# Patient Record
Sex: Female | Born: 1935
Health system: Southern US, Community
[De-identification: ages and names within clinical notes are randomized; demographics above are authoritative.]

## PROBLEM LIST (undated history)

## (undated) DIAGNOSIS — M1990 Unspecified osteoarthritis, unspecified site: Secondary | ICD-10-CM

## (undated) DIAGNOSIS — D75A Glucose-6-phosphate dehydrogenase (G6PD) deficiency without anemia: Secondary | ICD-10-CM

## (undated) DIAGNOSIS — I639 Cerebral infarction, unspecified: Secondary | ICD-10-CM

## (undated) DIAGNOSIS — R600 Localized edema: Secondary | ICD-10-CM

## (undated) DIAGNOSIS — R609 Edema, unspecified: Secondary | ICD-10-CM

## (undated) DIAGNOSIS — D510 Vitamin B12 deficiency anemia due to intrinsic factor deficiency: Secondary | ICD-10-CM

## (undated) DIAGNOSIS — I10 Essential (primary) hypertension: Secondary | ICD-10-CM

## (undated) HISTORY — PX: MULTIPLE TOOTH EXTRACTIONS: SHX2053

## (undated) HISTORY — PX: OTHER SURGICAL HISTORY: SHX169

---

## 1966-12-29 HISTORY — PX: BONE MARROW BIOPSY: SHX199

## 2000-09-28 DIAGNOSIS — I639 Cerebral infarction, unspecified: Secondary | ICD-10-CM | POA: Diagnosis present

## 2000-09-28 HISTORY — DX: Cerebral infarction, unspecified: I63.9

## 2000-10-11 ENCOUNTER — Encounter: Payer: Self-pay | Admitting: Emergency Medicine

## 2000-10-11 ENCOUNTER — Inpatient Hospital Stay (HOSPITAL_COMMUNITY): Admission: EM | Admit: 2000-10-11 | Discharge: 2000-10-14 | Payer: Self-pay | Admitting: Emergency Medicine

## 2000-10-12 ENCOUNTER — Encounter: Payer: Self-pay | Admitting: *Deleted

## 2000-10-14 ENCOUNTER — Inpatient Hospital Stay (HOSPITAL_COMMUNITY)
Admission: RE | Admit: 2000-10-14 | Discharge: 2000-10-20 | Payer: Self-pay | Admitting: Physical Medicine & Rehabilitation

## 2000-11-25 ENCOUNTER — Encounter
Admission: RE | Admit: 2000-11-25 | Discharge: 2001-01-14 | Payer: Self-pay | Admitting: Physical Medicine & Rehabilitation

## 2003-09-22 ENCOUNTER — Encounter: Admission: RE | Admit: 2003-09-22 | Discharge: 2003-09-22 | Payer: Self-pay | Admitting: Internal Medicine

## 2003-09-22 ENCOUNTER — Encounter: Payer: Self-pay | Admitting: Internal Medicine

## 2004-02-28 ENCOUNTER — Encounter: Payer: Self-pay | Admitting: Cardiology

## 2004-02-28 ENCOUNTER — Ambulatory Visit (HOSPITAL_COMMUNITY): Admission: RE | Admit: 2004-02-28 | Discharge: 2004-02-28 | Payer: Self-pay | Admitting: Internal Medicine

## 2005-05-01 ENCOUNTER — Encounter: Admission: RE | Admit: 2005-05-01 | Discharge: 2005-05-01 | Payer: Self-pay | Admitting: Internal Medicine

## 2008-11-14 ENCOUNTER — Encounter: Admission: RE | Admit: 2008-11-14 | Discharge: 2008-12-28 | Payer: Self-pay | Admitting: Internal Medicine

## 2009-01-02 ENCOUNTER — Encounter: Admission: RE | Admit: 2009-01-02 | Discharge: 2009-01-11 | Payer: Self-pay | Admitting: Internal Medicine

## 2010-05-10 ENCOUNTER — Encounter: Admission: RE | Admit: 2010-05-10 | Discharge: 2010-05-10 | Payer: Self-pay | Admitting: Internal Medicine

## 2011-05-16 NOTE — Discharge Summary (Signed)
[place]  Patient:    Connie Miranda, Connie Miranda                        MRN: [id number] Adm. Date:  [id number] Attending:  Faith Rogue T Dictator:   Mcarthur Rossetti. Angiulli, P.A. CC:         Raynelle Jan, M.D., [facility]   Discharge Summary  ADDENDUM  FOLLOWUP:  Raynelle Jan, M.D., of the [facility], who the patient was to see on November 10, 2000, at 10:30 a.m.  Phone number [phone]. DD:  10/20/00 TD:  10/20/00 Job: [id number]

## 2011-09-25 ENCOUNTER — Inpatient Hospital Stay (HOSPITAL_COMMUNITY)
Admission: EM | Admit: 2011-09-25 | Discharge: 2011-09-28 | DRG: 603 | Disposition: A | Discharge status: Home / Self Care | Payer: Medicare Other | Attending: Internal Medicine | Admitting: Internal Medicine

## 2011-09-25 DIAGNOSIS — Z880 Allergy status to penicillin: Secondary | ICD-10-CM

## 2011-09-25 DIAGNOSIS — D510 Vitamin B12 deficiency anemia due to intrinsic factor deficiency: Secondary | ICD-10-CM | POA: Diagnosis present

## 2011-09-25 DIAGNOSIS — I10 Essential (primary) hypertension: Secondary | ICD-10-CM | POA: Diagnosis present

## 2011-09-25 DIAGNOSIS — Z8673 Personal history of transient ischemic attack (TIA), and cerebral infarction without residual deficits: Secondary | ICD-10-CM

## 2011-09-25 DIAGNOSIS — L03119 Cellulitis of unspecified part of limb: Principal | ICD-10-CM | POA: Diagnosis present

## 2011-09-25 DIAGNOSIS — M1990 Unspecified osteoarthritis, unspecified site: Secondary | ICD-10-CM | POA: Diagnosis present

## 2011-09-25 DIAGNOSIS — Z7902 Long term (current) use of antithrombotics/antiplatelets: Secondary | ICD-10-CM

## 2011-09-25 DIAGNOSIS — Z888 Allergy status to other drugs, medicaments and biological substances status: Secondary | ICD-10-CM

## 2011-09-25 DIAGNOSIS — Z882 Allergy status to sulfonamides status: Secondary | ICD-10-CM

## 2011-09-25 DIAGNOSIS — I872 Venous insufficiency (chronic) (peripheral): Secondary | ICD-10-CM | POA: Diagnosis present

## 2011-09-25 DIAGNOSIS — Z79899 Other long term (current) drug therapy: Secondary | ICD-10-CM

## 2011-09-25 DIAGNOSIS — L02419 Cutaneous abscess of limb, unspecified: Principal | ICD-10-CM | POA: Diagnosis present

## 2011-09-25 DIAGNOSIS — R609 Edema, unspecified: Secondary | ICD-10-CM | POA: Diagnosis present

## 2011-09-25 DIAGNOSIS — R7309 Other abnormal glucose: Secondary | ICD-10-CM | POA: Diagnosis present

## 2011-09-25 DIAGNOSIS — E669 Obesity, unspecified: Secondary | ICD-10-CM | POA: Diagnosis present

## 2011-09-25 LAB — CBC
HCT: 34.6 % — ABNORMAL LOW (ref 36.0–46.0)
MCHC: 32.9 g/dL (ref 30.0–36.0)
MCV: 93.3 fL (ref 78.0–100.0)
Platelets: 280 10*3/uL (ref 150–400)
RDW: 12.8 % (ref 11.5–15.5)
WBC: 9 10*3/uL (ref 4.0–10.5)

## 2011-09-25 LAB — DIFFERENTIAL
Basophils Absolute: 0 10*3/uL (ref 0.0–0.1)
Eosinophils Absolute: 0.2 10*3/uL (ref 0.0–0.7)
Eosinophils Relative: 2 % (ref 0–5)
Lymphocytes Relative: 19 % (ref 12–46)
Lymphs Abs: 1.7 10*3/uL (ref 0.7–4.0)
Monocytes Absolute: 0.6 10*3/uL (ref 0.1–1.0)

## 2011-09-25 LAB — BASIC METABOLIC PANEL
BUN: 21 mg/dL (ref 6–23)
Chloride: 102 mEq/L (ref 96–112)
Creatinine, Ser: 1 mg/dL (ref 0.50–1.10)
GFR calc Af Amer: >60 mL/min (ref >60)
GFR calc non Af Amer: 54 mL/min — ABNORMAL LOW (ref >60)

## 2011-09-26 ENCOUNTER — Emergency Department (HOSPITAL_COMMUNITY): Payer: Medicare Other

## 2011-09-26 ENCOUNTER — Inpatient Hospital Stay (HOSPITAL_COMMUNITY): Payer: Medicare Other

## 2011-09-26 DIAGNOSIS — M7989 Other specified soft tissue disorders: Secondary | ICD-10-CM

## 2011-09-26 LAB — BASIC METABOLIC PANEL
Chloride: 102 mEq/L (ref 96–112)
GFR calc Af Amer: >60 mL/min (ref >60)
GFR calc non Af Amer: 56 mL/min — ABNORMAL LOW (ref >60)
Potassium: 3.7 mEq/L (ref 3.5–5.1)
Sodium: 140 mEq/L (ref 135–145)

## 2011-09-26 LAB — CBC
MCHC: 33.3 g/dL (ref 30.0–36.0)
Platelets: 268 10*3/uL (ref 150–400)
RDW: 12.8 % (ref 11.5–15.5)
WBC: 9.6 10*3/uL (ref 4.0–10.5)

## 2011-09-26 LAB — FERRITIN: Ferritin: 267 ng/mL (ref 10–291)

## 2011-09-26 LAB — IRON AND TIBC
Iron: 33 ug/dL — ABNORMAL LOW (ref 42–135)
Saturation Ratios: 15 % — ABNORMAL LOW (ref 20–55)
TIBC: 221 ug/dL — ABNORMAL LOW (ref 250–470)
UIBC: 188 ug/dL (ref 125–400)

## 2011-09-27 LAB — CBC
HCT: 31 % — ABNORMAL LOW (ref 36.0–46.0)
Hemoglobin: 10.2 g/dL — ABNORMAL LOW (ref 12.0–15.0)
MCH: 31.1 pg (ref 26.0–34.0)
RBC: 3.28 MIL/uL — ABNORMAL LOW (ref 3.87–5.11)

## 2011-09-27 LAB — HEMOGLOBIN A1C
Hgb A1c MFr Bld: 6.7 % — ABNORMAL HIGH (ref <5.7)
Mean Plasma Glucose: 146 mg/dL — ABNORMAL HIGH (ref <117)

## 2011-09-29 NOTE — H&P (Signed)
Connie Miranda, SALVAGGIO NO.:  [id number]  MEDICAL RECORD NO.:  [id number]  LOCATION:  [facility]                         FACILITY:  [facility]  PHYSICIAN:  Osvaldo Shipper, MD     DATE OF BIRTH:  [birth year]  DATE OF ADMISSION:  09/25/2011 DATE OF DISCHARGE:                             HISTORY & PHYSICAL   PRIMARY CARE PHYSICIAN:  [name], MD  ADMISSION DIAGNOSES: 1. Left lower extremity cellulitis, failed outpatient treatment. 2. Bilateral pedal edema. 3. History of hypertension. 4. History of arthritis. 5. History of pernicious anemia.  CHIEF COMPLAINT:  Left leg pain and swelling.  HISTORY OF PRESENT ILLNESS:  The patient is a 77 year old African American female who is obese who was in her usual state of health about 3 weeks ago when she started noticing a wound on the left lower extremity.  It was red, it was swollen, warm to touch.  She went to see a local physician who was not her PCP.  The patient was prescribed doxycycline which she took for about 2-3 weeks.  She has not experienced any improvement in that wound.  Initially, the wound was wet, she had some drainage, but over the last week or so, it has been dry.  Still has some warmth in that area.  She also has been applying an antibiotic cream to that wound.  She denies any fever or chills recently.  She has some difficulty ambulating, however, she attributes that to the arthritis.  Denies any shortness of breath or chest pain.  She does mention that her left leg is more swollen than the right.  She went to see her primary care physician today and upon examining the lower extremity, he recommended that the patient will need to receive IV antibiotics in an inpatient setting.  Medications at home include the following: 1. Vitamin B12, 1000 mcg q. monthly on the 11th of every month. 2. Flexeril 5 mg 3 times a day as needed for muscle spasms. 3. Vicodin 5/500 twice daily as needed for pain. 4.  Benicar/HCT 40/25 one tablet daily. 5. Os-Cal plus D over the counter twice daily. 6. Potassium chloride 10 mEq as needed with Lasix. 7. Lasix 40 mg daily as needed for fluid retention. 8. Norvasc 10 mg daily. 9. Clopidogrel 75 mg p.o. daily.  Allergies include ASPIRIN, CODEINE, PENICILLIN, and SULFA.  She tells me that she has a condition called phosphate 6, could be G6PD, but this is not entirely clear at this time.  PAST MEDICAL HISTORY:  Positive for: 1. Osteoarthritis. 2. Tendonitis. 3. Pernicious anemia. 4. She tells me she has an enlarged heart from hypertension, however,     denies any history of heart failure. 5. History of TIA about 11 years ago. 6. History of hypertension.  No history of diabetes.  No coronary     artery disease.  PAST SURGICAL HISTORY:  She has had dental surgery and removal of cyst from her skin over multiple sites.  SOCIAL HISTORY:  Lives alone in [place].  Denies smoking alcohol or illicit drug use.  She uses a cane to ambulate and occasionally a walker.  FAMILY HISTORY:  She has a brother who  has diabetes, mother who has CHF, father has heart disease.  REVIEW OF SYSTEMS:  GENERAL SYSTEM:  Positive for weakness, malaise. HEENT:  Unremarkable.  CARDIOVASCULAR:  Remarkable.  RESPIRATORY: Unremarkable.  GI:  Unremarkable.  GU:  Unremarkable.  NEUROLOGICAL: Unremarkable.  PSYCHIATRIC:  Unremarkable.  DERMATOLOGICAL:  As in HPI. MUSCULOSKELETAL:  As in HPI.  Other systems reviewed and found to be negative.  PHYSICAL EXAMINATION:  VITAL SIGNS:  Temperature 98.6, blood pressure 142/69, heart rate 81, respiratory rate 12, saturation 99% on room air. GENERAL:  This is an obese African American female in no distress. HEENT:  Head is normocephalic, atraumatic.  Pupils are equal, reacting. No pallor, no icterus.  Oral, mucous membranes moist.  No oral lesions are noted. NECK:  Soft and supple.  No thyromegaly is appreciated. LUNGS:  Clear to  auscultation bilaterally with no wheezing, rales, or rhonchi. CARDIOVASCULAR:  S1, S2 is normal, regular.  No S3, S4.  There is a systolic murmur appreciated over the aortic area.  She does have bilateral pitting pedal edema. ABDOMEN:  Obese, nontender, nondistended.  Bowel sounds present.  No masses or organomegaly appreciated.  She does have some inguinal lymphadenopathy, especially on the left side. GU:  Deferred. MUSCULOSKELETAL:  Normal muscle mass and tone with some joint deformities due to arthritis. SKIN:  She does have erythema over the left lower extremity along the lateral aspect.  The left leg also shows dried wound on the left side. No active drainage is noted.  The left leg is warm to touch, mildly erythematous, nontender.  She does have good peripheral pulses. NEUROLOGICAL:  She is alert, oriented x3.  No focal neurological deficits are present.  LABORATORY DATA:  Her white cell count is 9.0, hemoglobin is 11.4, MCV is 93, platelet count is 280.  Electrolytes are all normal, glucose is 122.  No imaging studies have been done.  ASSESSMENT:  This is a 77 year old African American female who is obese who has a left lower extremity wound and cellulitis.  She has been sent by her primary care physician for IV antibiotics.  She does have some erythema along the left leg.  She may benefit from a day or two of IV antibiotics, however, I think she needs long-term wound care.  We will also take this opportunity to get an x-ray of the left leg as well as venous Doppler to make sure there is no DVT or any other issues.  PLAN: 1. Left lower extremity cellulitis.  We will put her on vancomycin and     intravenous Levaquin for now.  We will have Wound Care evaluate     her.  She may require referral to a wound care center for long-term     management.  I anticipate that she would require only 1-2 days of     intravenous antibiotics and then she would be able to go home with      oral antibiotics and follow up with her PCP.  We will check x-ray     of left leg as well as venous Dopplers to make sure there is no     blood clot.  PT/OT will be asked to evaluate this patient as well. 2. History of hypertension.  Continue with Benicar and Norvasc. 3. History of anemia.  Check anemia panel. 4. Pedal edema.  We will go ahead and give her a dose of IV Lasix     tonight and then give her orally from tomorrow.  There  is a report     of an echocardiogram, but it is from 2005, which revealed an EF of     55%-65%.  No significant valvular abnormalities were noted at that     time.  There is no indication for an inpatient echo, however, I     would recommend an outpatient echocardiogram if one has not been     done recently.  TSH will also be checked and we will follow up on     the results of the venous Doppler. 5. Obesity.  Weight loss counseling will need to be provided. 6. History of transient ischemic attack.  Continue with Plavix.  Deep venous thrombosis prophylaxis will be initiated.  Further management decisions will depend on results of further testing and the patient's response to treatment.  Osvaldo Shipper, MD     GK/MEDQ  D:  09/25/2011  T:  09/25/2011  Job:  [id number]  cc:   [name], M.D.  Electronically Signed by Osvaldo Shipper MD on 09/29/2011 07:19:57 PM

## 2011-10-02 LAB — CULTURE, BLOOD (ROUTINE X 2)
Culture  Setup Time: 201209280854
Culture: NO GROWTH

## 2011-10-09 NOTE — Discharge Summary (Signed)
NAMEMARRIE, Connie Miranda NO.:  [id number]  MEDICAL RECORD NO.:  [id number]  LOCATION:  5524                         FACILITY:  [facility]  PHYSICIAN:  Gordy Savers, MDDATE OF BIRTH:  [birth year]  DATE OF ADMISSION:  09/25/2011 DATE OF DISCHARGE:  09/28/2011                              DISCHARGE SUMMARY   The patient is a 77 year old patient who was admitted for evaluation and treatment of worsening left lower extremity cellulitis after failure for outpatient treatment.  HISTORY OF PRESENT ILLNESS:  A 77 year old African American female who has history of obesity and chronic venous insufficiency.  Approximately 3 weeks prior to admission, she noted a superficial wound involving her left lower lateral extremity.  It became red, swollen, and tender to touch.  She was seen by physician approximately 2 weeks prior to admission and treated with doxycycline.  Due to failure of improvement, she was seen by her PCP who recommended referral to the ED for inpatient management of her left lower extremity cellulitis.  She denied any fever or chills.  She had only minimal discomfort with ambulation.  She denied any pulmonary complaints.  PAST MEDICAL HISTORY:  Pertinent for a penicillin, sulfa, codeine, and aspirin allergy.  MEDICAL PROBLEMS: 1. Osteoarthritis. 2. Pernicious anemia. 3. History of prior TIA. 4. History of hypertension.  LABORATORY DATA AND HOSPITAL COURSE:  The patient's white count was 9.0, hemoglobin 11.4, MCV 93, white count remained normal.  Electrolytes were unremarkable.  Admission glucose 122.  Examination of the lower extremity did reveal erythema and soft tissue swelling, most large involving her left lower extremity along the lateral aspect of the lower leg and foot, chronic stasis changes were noted.  There is a dry wound with eschar involving the left lateral lower leg, but no active drainage.  On admission, the leg was quite warm to  touch and slightly tender and erythematous.  Pedal pulses were intact.  The patient was admitted to the hospital where she was placed on parenteral antibiotics.  This included vancomycin and Levaquin during the hospital.  She was followed by pharmacy and was treated with a vancomycin protocol.  Her hospital course was marked by steady and slow improvement.  Wound care consult was obtained.  X-rays and lower extremity venous Doppler examinations were unremarkable.  There is no evidence of deep vein or superficial thrombosis or a Baker cyst.  Her blood pressure was well controlled during the hospital.  At the time of discharge, the patient was ambulatory without pain.  She continued to have chronic stasis changes and mild edema, but there is no active tenderness or warmth to touch.  DISPOSITION:  The patient will be discharged today to complete 7 days of Levaquin 500 mg p.o.  FINAL DIAGNOSES: 1. Left lower extremity cellulitis. 2. Stasis dermatitis with chronic lower extremity edema. 3. Hypertension. 4. Osteoarthritis.5. Peripheral edema. 6. Impaired glucose tolerance.  DISCHARGE MEDICATIONS: 1. Levaquin 500 mg daily for 7 additional days. 2. Benicar/HCT 40/25 daily. 3. Plavix 75 mg daily. 4. Flexeril 5 mg t.i.d. p.r.n. 5. Lasix 40 mg daily. 6. Norvasc 10 mg daily. 7. Os-Cal plus D twice daily. 8. Potassium chloride 10 mEq daily. 9. Vicodin  p.r.n.  The patient has been asked to apply Eucerin cream to her dry lower extremities b.i.d.  She has also been asked to follow up with her primary care provider on October 02, 2011.     Gordy Savers, MD     PFK/MEDQ  D:  09/28/2011  T:  09/28/2011  Job:  [id number]  Electronically Signed by [name] MD on 10/09/2011 09:15:11 AM

## 2012-10-11 ENCOUNTER — Emergency Department (HOSPITAL_COMMUNITY): Payer: Medicare Other

## 2012-10-11 ENCOUNTER — Encounter (HOSPITAL_COMMUNITY): Payer: Self-pay | Admitting: Emergency Medicine

## 2012-10-11 ENCOUNTER — Inpatient Hospital Stay (HOSPITAL_COMMUNITY)
Admission: EM | Admit: 2012-10-11 | Discharge: 2012-10-15 | DRG: 603 | Disposition: A | Discharge status: Home / Self Care | Payer: Medicare Other | Attending: Internal Medicine | Admitting: Internal Medicine

## 2012-10-11 DIAGNOSIS — D649 Anemia, unspecified: Secondary | ICD-10-CM

## 2012-10-11 DIAGNOSIS — Z8673 Personal history of transient ischemic attack (TIA), and cerebral infarction without residual deficits: Secondary | ICD-10-CM

## 2012-10-11 DIAGNOSIS — M1610 Unilateral primary osteoarthritis, unspecified hip: Secondary | ICD-10-CM | POA: Diagnosis present

## 2012-10-11 DIAGNOSIS — Z8249 Family history of ischemic heart disease and other diseases of the circulatory system: Secondary | ICD-10-CM

## 2012-10-11 DIAGNOSIS — I890 Lymphedema, not elsewhere classified: Secondary | ICD-10-CM | POA: Diagnosis present

## 2012-10-11 DIAGNOSIS — Z79899 Other long term (current) drug therapy: Secondary | ICD-10-CM

## 2012-10-11 DIAGNOSIS — R197 Diarrhea, unspecified: Secondary | ICD-10-CM | POA: Diagnosis not present

## 2012-10-11 DIAGNOSIS — R1012 Left upper quadrant pain: Secondary | ICD-10-CM | POA: Diagnosis present

## 2012-10-11 DIAGNOSIS — E7400 Glycogen storage disease, unspecified: Secondary | ICD-10-CM | POA: Diagnosis present

## 2012-10-11 DIAGNOSIS — R1032 Left lower quadrant pain: Secondary | ICD-10-CM

## 2012-10-11 DIAGNOSIS — M25559 Pain in unspecified hip: Secondary | ICD-10-CM

## 2012-10-11 DIAGNOSIS — L03116 Cellulitis of left lower limb: Secondary | ICD-10-CM

## 2012-10-11 DIAGNOSIS — R109 Unspecified abdominal pain: Secondary | ICD-10-CM

## 2012-10-11 DIAGNOSIS — Z7902 Long term (current) use of antithrombotics/antiplatelets: Secondary | ICD-10-CM

## 2012-10-11 DIAGNOSIS — K449 Diaphragmatic hernia without obstruction or gangrene: Secondary | ICD-10-CM | POA: Diagnosis present

## 2012-10-11 DIAGNOSIS — Z6841 Body Mass Index (BMI) 40.0 and over, adult: Secondary | ICD-10-CM

## 2012-10-11 DIAGNOSIS — Z87891 Personal history of nicotine dependence: Secondary | ICD-10-CM

## 2012-10-11 DIAGNOSIS — Z888 Allergy status to other drugs, medicaments and biological substances status: Secondary | ICD-10-CM

## 2012-10-11 DIAGNOSIS — D75A Glucose-6-phosphate dehydrogenase (G6PD) deficiency without anemia: Secondary | ICD-10-CM | POA: Diagnosis present

## 2012-10-11 DIAGNOSIS — K5730 Diverticulosis of large intestine without perforation or abscess without bleeding: Secondary | ICD-10-CM | POA: Diagnosis present

## 2012-10-11 DIAGNOSIS — R609 Edema, unspecified: Secondary | ICD-10-CM | POA: Diagnosis present

## 2012-10-11 DIAGNOSIS — Z880 Allergy status to penicillin: Secondary | ICD-10-CM

## 2012-10-11 DIAGNOSIS — R600 Localized edema: Secondary | ICD-10-CM

## 2012-10-11 DIAGNOSIS — L0390 Cellulitis, unspecified: Secondary | ICD-10-CM

## 2012-10-11 DIAGNOSIS — I639 Cerebral infarction, unspecified: Secondary | ICD-10-CM | POA: Diagnosis present

## 2012-10-11 DIAGNOSIS — I10 Essential (primary) hypertension: Secondary | ICD-10-CM | POA: Diagnosis present

## 2012-10-11 DIAGNOSIS — Z885 Allergy status to narcotic agent status: Secondary | ICD-10-CM

## 2012-10-11 DIAGNOSIS — D510 Vitamin B12 deficiency anemia due to intrinsic factor deficiency: Secondary | ICD-10-CM | POA: Diagnosis present

## 2012-10-11 DIAGNOSIS — M169 Osteoarthritis of hip, unspecified: Secondary | ICD-10-CM | POA: Diagnosis present

## 2012-10-11 DIAGNOSIS — Z886 Allergy status to analgesic agent status: Secondary | ICD-10-CM

## 2012-10-11 DIAGNOSIS — L02419 Cutaneous abscess of limb, unspecified: Principal | ICD-10-CM | POA: Diagnosis present

## 2012-10-11 DIAGNOSIS — E669 Obesity, unspecified: Secondary | ICD-10-CM | POA: Diagnosis present

## 2012-10-11 DIAGNOSIS — K5790 Diverticulosis of intestine, part unspecified, without perforation or abscess without bleeding: Secondary | ICD-10-CM

## 2012-10-11 DIAGNOSIS — G8929 Other chronic pain: Secondary | ICD-10-CM

## 2012-10-11 DIAGNOSIS — M1990 Unspecified osteoarthritis, unspecified site: Secondary | ICD-10-CM | POA: Diagnosis present

## 2012-10-11 HISTORY — DX: Glucose-6-phosphate dehydrogenase (G6PD) deficiency without anemia: D75.A

## 2012-10-11 HISTORY — DX: Edema, unspecified: R60.9

## 2012-10-11 HISTORY — DX: Cerebral infarction, unspecified: I63.9

## 2012-10-11 HISTORY — DX: Vitamin B12 deficiency anemia due to intrinsic factor deficiency: D51.0

## 2012-10-11 HISTORY — DX: Essential (primary) hypertension: I10

## 2012-10-11 HISTORY — DX: Unspecified osteoarthritis, unspecified site: M19.90

## 2012-10-11 LAB — COMPREHENSIVE METABOLIC PANEL
ALT: 16 U/L (ref 0–35)
AST: 24 U/L (ref 0–37)
CO2: 29 mEq/L (ref 19–32)
Chloride: 103 mEq/L (ref 96–112)
GFR calc non Af Amer: 58 mL/min — ABNORMAL LOW (ref >90)
Sodium: 139 mEq/L (ref 135–145)
Total Bilirubin: 0.4 mg/dL (ref 0.3–1.2)

## 2012-10-11 LAB — CBC
Hemoglobin: 10.8 g/dL — ABNORMAL LOW (ref 12.0–15.0)
MCV: 94.7 fL (ref 78.0–100.0)
Platelets: 283 10*3/uL (ref 150–400)
RBC: 3.58 MIL/uL — ABNORMAL LOW (ref 3.87–5.11)
WBC: 13.5 10*3/uL — ABNORMAL HIGH (ref 4.0–10.5)

## 2012-10-11 LAB — URINALYSIS, ROUTINE W REFLEX MICROSCOPIC
Nitrite: NEGATIVE
Protein, ur: 30 mg/dL — AB
Specific Gravity, Urine: 1.028 (ref 1.005–1.030)
Urobilinogen, UA: 0.2 mg/dL (ref 0.0–1.0)

## 2012-10-11 LAB — URINE MICROSCOPIC-ADD ON

## 2012-10-11 LAB — LACTIC ACID, PLASMA: Lactic Acid, Venous: 1.2 mmol/L (ref 0.5–2.2)

## 2012-10-11 MED ORDER — ONDANSETRON HCL 4 MG/2ML IJ SOLN
4.0000 mg | Freq: Once | INTRAMUSCULAR | Status: AC
  Administered 2012-10-11: 4 mg via INTRAVENOUS
  Filled 2012-10-11: qty 2

## 2012-10-11 MED ORDER — HYDROCODONE-ACETAMINOPHEN 5-325 MG PO TABS
1.0000 | ORAL_TABLET | Freq: Two times a day (BID) | ORAL | Status: DC | PRN
  Administered 2012-10-12 – 2012-10-14 (×2): 1 via ORAL
  Filled 2012-10-11 (×3): qty 1

## 2012-10-11 MED ORDER — HYDROCHLOROTHIAZIDE 25 MG PO TABS
25.0000 mg | ORAL_TABLET | Freq: Every day | ORAL | Status: DC
  Administered 2012-10-12 – 2012-10-15 (×4): 25 mg via ORAL
  Filled 2012-10-11 (×4): qty 1

## 2012-10-11 MED ORDER — ADULT MULTIVITAMIN W/MINERALS CH
1.0000 | ORAL_TABLET | Freq: Every day | ORAL | Status: DC
  Administered 2012-10-12 – 2012-10-15 (×4): 1 via ORAL
  Filled 2012-10-11 (×4): qty 1

## 2012-10-11 MED ORDER — HYDROMORPHONE HCL PF 1 MG/ML IJ SOLN
1.0000 mg | Freq: Once | INTRAMUSCULAR | Status: AC
  Administered 2012-10-11: 1 mg via INTRAVENOUS
  Filled 2012-10-11: qty 1

## 2012-10-11 MED ORDER — MORPHINE SULFATE 4 MG/ML IJ SOLN
4.0000 mg | Freq: Once | INTRAMUSCULAR | Status: AC
  Administered 2012-10-11: 4 mg via INTRAVENOUS
  Filled 2012-10-11: qty 1

## 2012-10-11 MED ORDER — POTASSIUM CHLORIDE CRYS ER 20 MEQ PO TBCR
10.0000 meq | EXTENDED_RELEASE_TABLET | Freq: Every day | ORAL | Status: DC
  Filled 2012-10-11: qty 0.5

## 2012-10-11 MED ORDER — CLOPIDOGREL BISULFATE 75 MG PO TABS
75.0000 mg | ORAL_TABLET | Freq: Every day | ORAL | Status: DC
  Administered 2012-10-12 – 2012-10-15 (×4): 75 mg via ORAL
  Filled 2012-10-11 (×5): qty 1

## 2012-10-11 MED ORDER — ALISKIREN FUMARATE 150 MG PO TABS
300.0000 mg | ORAL_TABLET | Freq: Every day | ORAL | Status: DC
  Administered 2012-10-13 – 2012-10-15 (×3): 300 mg via ORAL
  Filled 2012-10-11 (×5): qty 2

## 2012-10-11 MED ORDER — AMLODIPINE BESYLATE 10 MG PO TABS
10.0000 mg | ORAL_TABLET | Freq: Every day | ORAL | Status: DC
  Administered 2012-10-12 – 2012-10-15 (×4): 10 mg via ORAL
  Filled 2012-10-11 (×5): qty 1

## 2012-10-11 MED ORDER — OXYCODONE-ACETAMINOPHEN 5-325 MG PO TABS: ORAL_TABLET | ORAL | Status: DC

## 2012-10-11 MED ORDER — FUROSEMIDE 40 MG PO TABS
40.0000 mg | ORAL_TABLET | Freq: Every day | ORAL | Status: DC | PRN
Start: 2012-10-12 — End: 2012-10-13
  Administered 2012-10-12: 40 mg via ORAL
  Filled 2012-10-11: qty 1
  Filled 2012-10-11: qty 2

## 2012-10-11 MED ORDER — ONDANSETRON HCL 4 MG PO TABS: 4.0000 mg | ORAL_TABLET | Freq: Three times a day (TID) | ORAL | Status: DC | PRN

## 2012-10-11 MED ORDER — SODIUM CHLORIDE 0.9 % IV BOLUS (SEPSIS)
500.0000 mL | Freq: Once | INTRAVENOUS | Status: AC
  Administered 2012-10-11: 500 mL via INTRAVENOUS

## 2012-10-11 MED ORDER — IRBESARTAN 300 MG PO TABS
300.0000 mg | ORAL_TABLET | Freq: Every day | ORAL | Status: DC
  Administered 2012-10-13 – 2012-10-15 (×3): 300 mg via ORAL
  Filled 2012-10-11 (×5): qty 1

## 2012-10-11 MED ORDER — IOHEXOL 300 MG/ML  SOLN
80.0000 mL | Freq: Once | INTRAMUSCULAR | Status: AC | PRN
  Administered 2012-10-11: 80 mL via INTRAVENOUS

## 2012-10-11 MED ORDER — OLMESARTAN MEDOXOMIL-HCTZ 40-25 MG PO TABS: 1.0000 | ORAL_TABLET | Freq: Every day | ORAL | Status: DC

## 2012-10-11 NOTE — ED Provider Notes (Signed)
Home medications reviewed and orders entered to continue while awaiting disposition.  Patient currently resting in bed with eyes closed, appears to be sleeping.  Even, non-labored respirations.  Stable VS.  Jimmye Norman, NP 10/11/12 [phone]

## 2012-10-11 NOTE — ED Provider Notes (Signed)
History     CSN: [id number]  Arrival date & time 10/11/12  1128   First MD Initiated Contact with Patient 10/11/12 1159      Chief Complaint  Patient presents with  . Edema    (Consider location/radiation/quality/duration/timing/severity/associated sxs/prior treatment) HPI  Connie Miranda is a 78 y.o. female complaining of left hip, left lower quadrant pain described as severe, sharp intermittent. Patient denies any trauma, fever, change in bowel or bladder habits.  ambulates with walkler  [place]  Past Medical History  Diagnosis Date  . Hypertension   . Stroke   . Edema     History reviewed. No pertinent past surgical history.  No family history on file.  History  Substance Use Topics  . Smoking status: Not on file  . Smokeless tobacco: Not on file  . Alcohol Use:     OB History    Grav Para Term Preterm Abortions TAB SAB Ect Mult Living                  Review of Systems  Allergies  Asa; Codeine; Penicillins; and Phosphate  Home Medications  No current outpatient prescriptions on file.  BP 163/71  Pulse 104  Temp 98.7 F (37.1 C) (Oral)  Resp 16  SpO2 98%  Physical Exam  Nursing note and vitals reviewed. Constitutional: She is oriented to person, place, and time. She appears well-developed and well-nourished. No distress.       Obese  HENT:  Head: Normocephalic.  Mouth/Throat: Oropharynx is clear and moist.  Eyes: Conjunctivae normal and EOM are normal. Pupils are equal, round, and reactive to light.  Cardiovascular: Normal rate and intact distal pulses.   Pulmonary/Chest: Effort normal and breath sounds normal. No stridor. No respiratory distress. She has no rales. She exhibits no tenderness.  Abdominal: Bowel sounds are normal. She exhibits no distension and no mass. There is tenderness. There is no rebound and no guarding.       Mild tenderness to deep palpation of the left lower quadrant.  Musculoskeletal: Normal range of motion.    Full ROM to left hip.   Neurological: She is alert and oriented to person, place, and time.  Skin:       Significant bilateral lower extremity edema with flaking skin no bruising or signs of active infection.  Psychiatric: She has a normal mood and affect.    ED Course  Procedures (including critical care time)  Labs Reviewed  CBC - Abnormal; Notable for the following:    WBC 13.5 (*)     RBC 3.58 (*)     Hemoglobin 10.8 (*)     HCT 33.9 (*)     All other components within normal limits  COMPREHENSIVE METABOLIC PANEL - Abnormal; Notable for the following:    Glucose, Bld 166 (*)     GFR calc non Af Amer 58 (*)     GFR calc Af Amer 67 (*)     All other components within normal limits  URINALYSIS, ROUTINE W REFLEX MICROSCOPIC - Abnormal; Notable for the following:    APPearance CLOUDY (*)     Hgb urine dipstick SMALL (*)     Protein, ur 30 (*)     Leukocytes, UA TRACE (*)     All other components within normal limits  URINE MICROSCOPIC-ADD ON - Abnormal; Notable for the following:    Squamous Epithelial / LPF MANY (*)     Casts HYALINE CASTS (*)  All other components within normal limits  LACTIC ACID, PLASMA   Dg Hip Complete Left  10/11/2012  *RADIOLOGY REPORT*  Clinical Data: Pain in the left hip for 2 days.  No known injury. Unable to move or rotate legs.  LEFT HIP - COMPLETE 2+ VIEW  Comparison: None.  Findings: AP and lateral views of the left hip include an AP view of the pelvis.  There is contrast within the small bowel loops in anticipation of CT exam ordered later today.  Degenerative changes are seen in the hip.  There is no evidence for acute fracture or subluxation.  Of overlying the central pelvis, there is an intrauterine device.  IMPRESSION:  1.  No evidence for acute abnormality of the left hip. 2.  Degenerative changes. 3.  Intrauterine device overlies the central pelvis. 3.  Contrast within small bowel, obscuring detail in the lower abdomen pelvis.   Original  Report Authenticated By: Patterson Hammersmith, M.D.    Ct Abdomen Pelvis W Contrast  10/11/2012  *RADIOLOGY REPORT*  Clinical Data: Left lower quadrant pain for 3 days, left hip pain for 1 day, chronic bilateral pitting edema  CT ABDOMEN AND PELVIS WITH CONTRAST  Technique:  Multidetector CT imaging of the abdomen and pelvis was performed following the standard protocol during bolus administration of intravenous contrast.  Contrast: 80mL OMNIPAQUE IOHEXOL 300 MG/ML  SOLN Dilute oral contrast.  Comparison: None  Findings: Minimal bibasilar atelectasis. Calcified granulomata within liver and spleen. Probable small cyst left kidney 11 x 8 mm image 35. Liver, spleen, pancreas, kidneys, and adrenal glands otherwise normal appearance. Normal appendix. Small hiatal hernia with wall thickening at the gastroesophageal junction, question mass versus inflammatory process or artifact from underdistension. IUD within uterus. Bladder and adnexae unremarkable.  Few sigmoid diverticula are identified without definite evidence of diverticulitis. Numerous normal inguinal lymph nodes bilaterally. Minimal atherosclerotic calcification aorta. No mass, adenopathy, free fluid or inflammatory process. No hernia or acute bony lesion. Multilevel degenerative disc and facet disease changes of the lumbar spine with multifactorial spinal stenosis at L3-L4, L4-L5, and L5-S1.  IMPRESSION: Old granulomatous disease. Minimal sigmoid diverticulosis without evidence of diverticulitis. Wall thickening at gastroesophageal junction question inflammatory process, tumor, or underdistension artifact; recommend EGD or barium esophagram to assess. No definite cause for left hip/left lower quadrant abdominal pain identified. Degenerative disc disease changes lumbar spine with multilevel spinal stenosis.   Original Report Authenticated By: Lollie Marrow, M.D.      1. LLQ pain   2. Diverticulosis       MDM  Abdominal exam is non-surgical with no  peritoneal signs and only minor tenderness to palpation of  the left lower quadrant.  Patient's pain comes in waves and is very severe at it's worst.  Lactic acid is negative, Hip XR US unremarkable. .   Because she has a leukocytosis and considering her age and comorbidities I think it is reasonable to obtain imaging at this time. CT will be ordered.   CT shows no acute processes and Pt is resting comfortably with resolution of pain.   I have advised Pt that the CAT scan revealed an abnormality at the gastroesophageal junction. I have advised her that she must follow with her primary care doctor to further evaluate this. I instructed her to [facility] her discharge paperwork to Dr. Concepcion Elk so he may understand what was seen on the CT and recommended. Patient verbalized understanding. Return precautions given.  New Prescriptions   ONDANSETRON (ZOFRAN) 4 MG  TABLET    Take 1 tablet (4 mg total) by mouth every 8 (eight) hours as needed for nausea.   OXYCODONE-ACETAMINOPHEN (PERCOCET/ROXICET) 5-325 MG PER TABLET    1 to 2 tabs PO q6hrs  PRN for pain         Wynetta Emery, PA-C 10/11/12 1643

## 2012-10-11 NOTE — ED Provider Notes (Signed)
Connie Miranda is a 78 y.o. female who was initially seen by Dr. Rubin Miranda and Ms. Connie Miranda. she was discharged home with symptomatic treatment for her discomfort. This would've been her usual medications. And the EMS services. Vehicle, was called to transport her home. They found that the patient was unable to ambulate, so left her here. Since then, the patient has tried to find someone that can help her at home. She has been unable to do that. She lives alone.  On exam currently, the patient is alert, cooperative. She is overweight, and appears debilitated. Last vital signs, at 1800 hours were remarkable for mild, hypertension. I observed. The patient attempted to walk, and she could not do it without assistance; when she tried to go to the bathroom.  The patient is at risk for fall. If she is discharged to her current home situation. There is no one that can help her there currently. She'll be transferred to the clinical decision unit for observation overnight, and morning assessment by PT and OT for possible rehabilitation placement versus in-home care with a home health service..  Medical screening examination/treatment/procedure(s) were conducted as a shared visit with non-physician practitioner(s) and myself.  I personally evaluated the patient during the encounter  Flint Melter, MD 10/12/12 [phone]

## 2012-10-11 NOTE — ED Notes (Signed)
Pt returned to room from radiology, no distress noted 

## 2012-10-11 NOTE — ED Notes (Signed)
Patient will be transported to CDU #9. Waiting for social work consult in AM.

## 2012-10-11 NOTE — ED Notes (Signed)
Pt remains in hall bed, waiting for her daughter to return home so that patient can be transported home.

## 2012-10-11 NOTE — ED Notes (Signed)
[facility] contacted to transport patient back home 

## 2012-10-11 NOTE — ED Notes (Signed)
To ED from home via EMS, pt has chronic bilat pitting LE edema which is not painful, c/o new left hip pain since yesterday with no trauma, no deformity noted, VSS, NAD

## 2012-10-12 ENCOUNTER — Encounter (HOSPITAL_COMMUNITY): Payer: Self-pay | Admitting: Internal Medicine

## 2012-10-12 ENCOUNTER — Emergency Department (HOSPITAL_COMMUNITY): Payer: Medicare Other

## 2012-10-12 DIAGNOSIS — M1990 Unspecified osteoarthritis, unspecified site: Secondary | ICD-10-CM | POA: Diagnosis present

## 2012-10-12 DIAGNOSIS — D649 Anemia, unspecified: Secondary | ICD-10-CM

## 2012-10-12 DIAGNOSIS — I10 Essential (primary) hypertension: Secondary | ICD-10-CM | POA: Diagnosis present

## 2012-10-12 DIAGNOSIS — R109 Unspecified abdominal pain: Secondary | ICD-10-CM

## 2012-10-12 DIAGNOSIS — R1032 Left lower quadrant pain: Secondary | ICD-10-CM

## 2012-10-12 DIAGNOSIS — D75A Glucose-6-phosphate dehydrogenase (G6PD) deficiency without anemia: Secondary | ICD-10-CM | POA: Diagnosis present

## 2012-10-12 DIAGNOSIS — D510 Vitamin B12 deficiency anemia due to intrinsic factor deficiency: Secondary | ICD-10-CM | POA: Diagnosis present

## 2012-10-12 DIAGNOSIS — L0390 Cellulitis, unspecified: Secondary | ICD-10-CM

## 2012-10-12 DIAGNOSIS — L0291 Cutaneous abscess, unspecified: Secondary | ICD-10-CM

## 2012-10-12 DIAGNOSIS — R609 Edema, unspecified: Secondary | ICD-10-CM | POA: Diagnosis present

## 2012-10-12 DIAGNOSIS — L03116 Cellulitis of left lower limb: Secondary | ICD-10-CM

## 2012-10-12 DIAGNOSIS — M25559 Pain in unspecified hip: Secondary | ICD-10-CM

## 2012-10-12 LAB — CREATININE, SERUM
Creatinine, Ser: 0.92 mg/dL (ref 0.50–1.10)
GFR calc Af Amer: 68 mL/min — ABNORMAL LOW (ref >90)
GFR calc non Af Amer: 59 mL/min — ABNORMAL LOW (ref >90)

## 2012-10-12 LAB — CBC
HCT: 35.6 % — ABNORMAL LOW (ref 36.0–46.0)
Hemoglobin: 11.4 g/dL — ABNORMAL LOW (ref 12.0–15.0)
MCHC: 32 g/dL (ref 30.0–36.0)
RBC: 3.69 MIL/uL — ABNORMAL LOW (ref 3.87–5.11)
WBC: 10 10*3/uL (ref 4.0–10.5)

## 2012-10-12 MED ORDER — MORPHINE SULFATE 4 MG/ML IJ SOLN: 4.0000 mg | Freq: Once | INTRAMUSCULAR | Status: DC

## 2012-10-12 MED ORDER — HYDROMORPHONE HCL PF 1 MG/ML IJ SOLN
1.0000 mg | INTRAMUSCULAR | Status: DC | PRN
Start: 2012-10-12 — End: 2012-10-12

## 2012-10-12 MED ORDER — HYDROMORPHONE HCL PF 1 MG/ML IJ SOLN: 0.5000 mg | INTRAMUSCULAR | Status: AC | PRN

## 2012-10-12 MED ORDER — SENNA 8.6 MG PO TABS
1.0000 | ORAL_TABLET | Freq: Two times a day (BID) | ORAL | Status: DC
  Administered 2012-10-12 – 2012-10-13 (×2): 8.6 mg via ORAL
  Filled 2012-10-12 (×3): qty 1

## 2012-10-12 MED ORDER — DOCUSATE SODIUM 100 MG PO CAPS
100.0000 mg | ORAL_CAPSULE | Freq: Two times a day (BID) | ORAL | Status: DC
  Administered 2012-10-12 – 2012-10-13 (×2): 100 mg via ORAL
  Filled 2012-10-12 (×2): qty 1

## 2012-10-12 MED ORDER — POTASSIUM CHLORIDE 20 MEQ/15ML (10%) PO LIQD
10.0000 meq | Freq: Every day | ORAL | Status: DC
  Administered 2012-10-12 – 2012-10-15 (×4): 10 meq via ORAL
  Filled 2012-10-12 (×2): qty 7.5
  Filled 2012-10-12: qty 15
  Filled 2012-10-12: qty 7.5
  Filled 2012-10-12: qty 15

## 2012-10-12 MED ORDER — ONDANSETRON HCL 4 MG PO TABS: 4.0000 mg | ORAL_TABLET | Freq: Four times a day (QID) | ORAL | Status: DC | PRN

## 2012-10-12 MED ORDER — ONDANSETRON HCL 4 MG/2ML IJ SOLN
4.0000 mg | Freq: Once | INTRAMUSCULAR | Status: DC
  Filled 2012-10-12: qty 2

## 2012-10-12 MED ORDER — ONDANSETRON HCL 4 MG/2ML IJ SOLN: 4.0000 mg | Freq: Once | INTRAMUSCULAR | Status: DC

## 2012-10-12 MED ORDER — HEPARIN SODIUM (PORCINE) 5000 UNIT/ML IJ SOLN
5000.0000 [IU] | Freq: Three times a day (TID) | INTRAMUSCULAR | Status: DC
  Administered 2012-10-12 – 2012-10-15 (×8): 5000 [IU] via SUBCUTANEOUS
  Filled 2012-10-12 (×11): qty 1

## 2012-10-12 MED ORDER — MORPHINE SULFATE 4 MG/ML IJ SOLN
4.0000 mg | Freq: Once | INTRAMUSCULAR | Status: DC
  Filled 2012-10-12: qty 1

## 2012-10-12 MED ORDER — ACETAMINOPHEN 325 MG PO TABS: 650.0000 mg | ORAL_TABLET | Freq: Four times a day (QID) | ORAL | Status: DC | PRN

## 2012-10-12 MED ORDER — CLINDAMYCIN PHOSPHATE 600 MG/50ML IV SOLN
600.0000 mg | Freq: Once | INTRAVENOUS | Status: AC
  Administered 2012-10-12: 600 mg via INTRAVENOUS
  Filled 2012-10-12: qty 50

## 2012-10-12 MED ORDER — MORPHINE SULFATE 2 MG/ML IJ SOLN: 1.0000 mg | INTRAMUSCULAR | Status: DC | PRN

## 2012-10-12 MED ORDER — ONDANSETRON HCL 4 MG/2ML IJ SOLN: 4.0000 mg | Freq: Three times a day (TID) | INTRAMUSCULAR | Status: DC | PRN

## 2012-10-12 MED ORDER — MAGNESIUM HYDROXIDE 400 MG/5ML PO SUSP: 30.0000 mL | Freq: Every day | ORAL | Status: DC | PRN

## 2012-10-12 MED ORDER — ACETAMINOPHEN 650 MG RE SUPP: 650.0000 mg | Freq: Four times a day (QID) | RECTAL | Status: DC | PRN

## 2012-10-12 MED ORDER — ONDANSETRON HCL 4 MG/2ML IJ SOLN: 4.0000 mg | Freq: Four times a day (QID) | INTRAMUSCULAR | Status: DC | PRN

## 2012-10-12 NOTE — ED Provider Notes (Signed)
Medical screening examination/treatment/procedure(s) were conducted as a shared visit with non-physician practitioner(s) and myself.  I personally evaluated the patient during the encounter. Patient with abdominal pain and negative CT. Has abnormality no indication followed. Also has edema in her extremities.  Juliet Rude. Rubin Payor, MD 10/12/12 1558

## 2012-10-12 NOTE — ED Notes (Signed)
Condon placed on pt while pt sleeping. SPO2 intermitantly low, intermitantly erroneous SPO2 reading d/t BP cuff on pulse ox arm, NAD, calm, sleeping, arousable to voice & spontaneously, interactive.

## 2012-10-12 NOTE — ED Notes (Signed)
cxr results reviewed.

## 2012-10-12 NOTE — H&P (Signed)
[facility] History and Physical  Connie Miranda ZOX:[id number] DOB: 1933/06/19 DOA: 10/11/2012  Referring physician: Wynetta Emery PCP: No primary provider on file.   Chief Complaint:  Abdominal pain  HPI:   The patient is a 78yo female with history of HTN, stroke without residual deficit, pernicious anemia, possible G6PD deficiency, and osteoarthritis who presents with abdominal pain and difficulty ambulating.  She is alone and is a poor historian.  She states that about 4 days prior to admission she developed sudden onset 10/10 pain in the left upper quadrant that lasted about 5 minutes.  The pain was so severe that she stopped what she was doing and rested, which helped the pain go away.  Her pain returned and hour later "with a vengeance."  Bending over helped her pain so she sat down in a recliner and was able to fall asleep.  She woke up in Sunday in the same chair with the same pain which came and went about 5 times during the day.  She tried taking vicodin with little relief.  She also had some difficulty ambulating secondary to her feet being heavy.  She normally scoots around in a wheelchair or she uses a cane to ambulate very Mikaelyn Arthurs distances.  The day prior to admission, she decided she should have her recurrent abdominal pain evaluated so she called a friend and then called EMS to be taken to the ER.  She received morphine and had an abdominal CT which was notable for thickening at the esophagogastric junction but no other explanation for her symptoms.  She also endorsed some left hip pain which is stable from prior and XR of hip confirmed OA.    Regarding esophagogastric junction thickening:  Food gets stuck sometimes when she eats in the chest area, but the last time this happened was several years ago.  She states she eats slowly to prevent food from getting stuck.  Occasionally she regurgitates food, but last time was several years ago.  Denies heartburn, but gets indigestion  when drinking soda.  She states her legs are less swollen and the usual amount of redness over the last several days.  In the ER, she was given clindamycin for lower extremity cellulitis several hours prior to hospitalist examination.   Review of Systems:   She has worse swelling of bilateral legs, left leg worse than right which has been going for over a year, which is better over the last several days.  Denies cough, shortness of breath, chest pain.  She gets chest pain when she drinks a cold soda and this is relieved by burping.  Denies nausea, constipation, diarrhea, blood in stools.  No urinary tract infection.  Denies weight loss, fevers, night sweats.    Past Medical History  Diagnosis Date  . Hypertension   . Stroke 09/2000    couldn't move her left foot which resolved  . Edema   . Pernicious anemia   . Glucose-6-phosphate dehydrogenase deficiency     possible diagnosis of G6PD per patient "phosphate 6 deficiency"  . Osteoarthritis    Past Surgical History  Procedure Date  . Other surgical history     cyst from panty line removed as child  . Bone marrow biopsy 12/1966    for anemia   Social History:  reports that she quit smoking about 18 years ago. Her smoking use included Cigarettes. She does not have any smokeless tobacco history on file. Her alcohol and drug histories not on file. Currently lives  at home alone.  Live alone in house and uses a cane and wheelchair to get around.  She has not been upstairs in over a year.  She does not drive, but she uses a cab or bus to go to grocery store, but usually friends and family bring her things.  Niece and nephew in [place].    Allergies  Allergen Reactions  . Asa (Aspirin) Other (See Comments)    Doctor told her not to take due to type anemia  . Codeine Other (See Comments)    Made her too sleepy  . Penicillins Itching and Swelling  . Phosphate Other (See Comments)    Anything containing phosphate 6. Unknown reaction     Family History  Problem Relation Age of Onset  . Diabetes Brother   . Diabetes Brother   . Heart failure Mother 79  . Heart attack Father 11    Prior to Admission medications   Medication Sig Start Date End Date Taking? Authorizing Provider  al MG tabletiskiren (TEKTURNA) 300 Take 300 mg by mouth daily.   Yes Historical Provider, MD  amLODipine (NORVASC) 10 MG tablet Take 10 mg by mouth daily.   Yes Historical Provider, MD  BIOTIN PO Take 1 tablet by mouth daily.   Yes Historical Provider, MD  Calcium Carbonate-Vitamin D (CALCIUM + D PO) Take 1 tablet by mouth daily.   Yes Historical Provider, MD  clopidogrel (PLAVIX) 75 MG tablet Take 75 mg by mouth daily.   Yes Historical Provider, MD  furosemide (LASIX) 40 MG tablet Take 40 mg by mouth daily as needed. fluid   Yes Historical Provider, MD  HYDROcodone-acetaminophen (VICODIN) 5-500 MG per tablet Take 1 tablet by mouth 2 (two) times daily as needed. pain   Yes Historical Provider, MD  Multiple Vitamin (MULTIVITAMIN WITH MINERALS) TABS Take 1 tablet by mouth daily.   Yes Historical Provider, MD  olmesartan-hydrochlorothiazide (BENICAR HCT) 40-25 MG per tablet Take 1 tablet by mouth daily.   Yes Historical Provider, MD  potassium chloride (K-DUR,KLOR-CON) 10 MEQ tablet Take 10 mEq by mouth daily.   Yes Historical Provider, MD  PRESCRIPTION MEDICATION Take 1 tablet by mouth 3 (three) times daily as needed. Flexeril For Muscle spasm.   Yes Historical Provider, MD  ondansetron (ZOFRAN) 4 MG tabletart Take 1 tablet (4 mg total) by mouth every 8 (eight) hours as needed for nausea. 10/11/12   Nicole Pisciotta, PA-C  oxyCODONE-acetaminophen (PERCOCET/ROXICET) 5-325 MG per tablet 1 to 2 tabs PO q6hrs  PRN for pain 10/11/12   Wynetta Emery, PA-C   Physical Exam: Filed Vitals:   10/12/12 1657 10/12/12 1759 10/12/12 1913 10/12/12 2033  BP: 142/56 115/91 154/96   Pulse: 85 87 87   Temp: 98.5 F (36.9 C) 98.5 F (36.9 C) 98.6 F (37 C)    TempSrc: Oral Oral    Resp: 16 16 20    Height:    5\' 4"  (1.626 m)  SpO2: 96% 97% 100%      General:  Obese AAF, no acute distress, lying on stretcher  Eyes: PERRL, anicteric, mildly dysconjugate gaze  ENT: Nares clear, OP with MMM, no exudates or plaques  Neck: supple, no discernable thyromegaly  Lymph:  No cervical or supraclavicular lymphadenopathy  Cardiovascular: RRR, no mrg  Respiratory: CTAB  Abdomen:  NABS, soft, nondistended, obese.  Mildly tender in the left upper quadrant without rebound or guarding  Skin:  Hyperpigmentation with bullae and vesicles on bilateral feet, ankles, and shins with 2cm yellow  crusted ulcer anterior right shin.  Mild erythema extending 2-3cm above area of skin change on left lower extremity  Musculoskeletal:  Evidence of severe chronic pitting edema of bilateral feet and ankles.  3+ pitting edema.    Psychiatric: A&Ox4, tangential, forgetful  Neurologic:  III-XII intact, strength 5/5 throughout, no dysmetria.    Labs on Admission:  Basic Metabolic Panel:  Lab 10/12/12 1610 10/11/12 1235  NA -- 139  K -- 4.2  CL -- 103  CO2 -- 29  GLUCOSE -- 166*  BUN -- 17  CREATININE 0.92 0.93  CALCIUM -- 9.8  MG -- --  PHOS -- --   Liver Function Tests:  Lab 10/11/12 1235  AST 24  ALT 16  ALKPHOS 85  BILITOT 0.4  PROT 8.1  ALBUMIN 3.7   No results found for this basename: LIPASE:5,AMYLASE:5 in the last 168 hours No results found for this basename: AMMONIA:5 in the last 168 hours CBC:  Lab 10/12/12 1905 10/11/12 1235  WBC 10.0 13.5*  NEUTROABS -- --  HGB 11.4* 10.8*  HCT 35.6* 33.9*  MCV 96.5 94.7  PLT 298 283   Cardiac Enzymes: No results found for this basename: CKTOTAL:5,CKMB:5,CKMBINDEX:5,TROPONINI:5 in the last 168 hours  BNP (last 3 results) No results found for this basename: PROBNP:3 in the last 8760 hours CBG: No results found for this basename: GLUCAP:5 in the last 168 hours  Radiological Exams on  Admission: Dg Hip Complete Left  10/11/2012  *RADIOLOGY REPORT*  Clinical Data: Pain in the left hip for 2 days.  No known injury. Unable to move or rotate legs.  LEFT HIP - COMPLETE 2+ VIEW  Comparison: None.  Findings: AP and lateral views of the left hip include an AP view of the pelvis.  There is contrast within the small bowel loops in anticipation of CT exam ordered later today.  Degenerative changes are seen in the hip.  There is no evidence for acute fracture or subluxation.  Of overlying the central pelvis, there is an intrauterine device.  IMPRESSION:  1.  No evidence for acute abnormality of the left hip. 2.  Degenerative changes. 3.  Intrauterine device overlies the central pelvis. 3.  Contrast within small bowel, obscuring detail in the lower abdomen pelvis.   Original Report Authenticated By: Patterson Hammersmith, M.D.    Ct Abdomen Pelvis W Contrast  10/11/2012  *RADIOLOGY REPORT*  Clinical Data: Left lower quadrant pain for 3 days, left hip pain for 1 day, chronic bilateral pitting edema  CT ABDOMEN AND PELVIS WITH CONTRAST  Technique:  Multidetector CT imaging of the abdomen and pelvis was performed following the standard protocol during bolus administration of intravenous contrast.  Contrast: 80mL OMNIPAQUE IOHEXOL 300 MG/ML  SOLN Dilute oral contrast.  Comparison: None  Findings: Minimal bibasilar atelectasis. Calcified granulomata within liver and spleen. Probable small cyst left kidney 11 x 8 mm image 35. Liver, spleen, pancreas, kidneys, and adrenal glands otherwise normal appearance. Normal appendix. Small hiatal hernia with wall thickening at the gastroesophageal junction, question mass versus inflammatory process or artifact from underdistension. IUD within uterus. Bladder and adnexae unremarkable.  Few sigmoid diverticula are identified without definite evidence of diverticulitis. Numerous normal inguinal lymph nodes bilaterally. Minimal atherosclerotic calcification aorta. No mass,  adenopathy, free fluid or inflammatory process. No hernia or acute bony lesion. Multilevel degenerative disc and facet disease changes of the lumbar spine with multifactorial spinal stenosis at L3-L4, L4-L5, and L5-S1.  IMPRESSION: Old granulomatous disease. Minimal sigmoid diverticulosis  without evidence of diverticulitis. Wall thickening at gastroesophageal junction question inflammatory process, tumor, or underdistension artifact; recommend EGD or barium esophagram to assess. No definite cause for left hip/left lower quadrant abdominal pain identified. Degenerative disc disease changes lumbar spine with multilevel spinal stenosis.   Original Report Authenticated By: Lollie Marrow, M.D.    Dg Chest Port 1 View  10/12/2012  *RADIOLOGY REPORT*  Clinical Data: Shortness of breath.  PORTABLE CHEST - 1 VIEW  Comparison: None.  Findings: Severe right greater than left glenohumeral joint osteoarthritis. Cardiomegaly accentuated by AP portable technique. No pleural effusion or pneumothorax.  Low lung volumes.  This accentuates the pulmonary interstitium.  Suspicion of mild concurrent peribronchial thickening. No lobar consolidation.  IMPRESSION:  1.  Cardiomegaly and mildly low lung volumes. 2.  Suspicion of concurrent interstitial thickening, as can be seen with chronic bronchitis/smoking. No lobar consolidation.   Original Report Authenticated By: Consuello Bossier, M.D.     EKG:  none  Assessment/Plan Principal Problem:  *Cellulitis of left lower leg Active Problems:  Hypertension  Stroke  Edema  Pernicious anemia  Glucose-6-phosphate dehydrogenase deficiency  Osteoarthritis  Cellulitis of left lower extremity:  Erythema extends 2-3 cm above area of chronic skin changes on left lower leg -  Clindamycin -  Elevate extremities, LLL in particular -  Recommend unna boots after no longer needing to observe for cellulitis and prior to discharge  Abdominal pain:  Unclear etiology.  Urinalysis has a LE  without WBC, LFTs are wnl, and CT scan unremarkable.  Concern that GE junction thickening may be related to abdominal pain, but patient denies worsening of pain with eating and recent food getting stuck in chest sensation -  Lipase in AM -  Barium esophagram -  Consider GI consult for EUS  Hip pain due to OA -  Vicodin, IV dilaudid prn  Edema of lower extremities is likely due to venous stasis, but consider cardiac etiology as well.  ON mild protein on UA and albumin not depressed -  2D ECHO -  Continue lasix and potassium  CVA, stable -  Continue ASA and plavix  HTN, stable -  Continue HCTZ, ARB, norvasc and aliskiren  Normocytic anemia, stable.  Has history of pernicious anemia, but not taking supplemental b12. -  Defer to PCP  ?Hx of G6PD deficiency per patient report -  G6PD test with AM as may effect choice of antibiotics now or in the future.    DIET:  Healthy heart ACCESS:  PIV IVF:  None PROPH:  heparin  Code Status: full Family Communication: with patient Disposition Plan: Falls risk and needs supervision but lives alone.  PT/OT consultations.  Currently admitted to obs and will have CM and SW try to reach family who can stay with her with home health services.    Time spent: 5  SHORTThea Silversmith [facility] Pager [phone]  If 7PM-7AM, please contact night-coverage [email or web address] Password [email or web address] 10/12/2012, 10:13 PM

## 2012-10-12 NOTE — ED Notes (Signed)
Pt states she is not in pain right now and does not want the Morphine right now.

## 2012-10-12 NOTE — Progress Notes (Signed)
   CARE MANAGEMENT ED NOTE 10/12/2012  Patient:  BARBARA, AHART   Account Number:  [id number]  Date Initiated:  10/12/2012  Documentation initiated by:  Fransico Michael  Subjective/Objective Assessment:   presented to ED with c/o abdominal pain.     Subjective/Objective Assessment Detail:     Action/Plan:   Action/Plan Detail:   Anticipated DC Date:  10/12/2012     Status Recommendation to Physician:   Result of Recommendation:      DC Planning Services  CM consult  PCP issues    Choice offered to / List presented to:            Status of service:  In process, will continue to follow  ED Comments:   ED Comments Detail:  10/12/12-1043-J.Markisha Meding,RN,BSN [phone]      Recieved call from PA regarding patient. Per report, "patient was discharged home but [facility] refused to transport her home due to safety concerns with no one being at home for patient." CM in to speak with patient. Patient reports that she lives at home alone. Also reports that family lives out of town and that she has friends and neighbors check in on her. Per Dr. Phoebe Sharps, she is at risk for fall if discharged to home. CM discussed case with Trula Ore, CSW. Will continue to monitor for needs.

## 2012-10-12 NOTE — ED Notes (Signed)
Spoke with Christa See, PA about BP medications being delayed since 1000 this morning. Christa See, states to jsut give amlodipine (Norvasc) and hydrochlorothiazide (Hydrodiuril) for the time being and recheck pts blood pressure and monitor pt for hour and then give remaining medications. Pt expressed concern about taking morphine. Pt does not have IV established therefore morphine, zofran and clindamycin administration has been delayed since 1345. Christa See, PA notified about pts lack of IV access and informed nurse not to be concerned with giving particular medications at this time.

## 2012-10-12 NOTE — ED Notes (Signed)
Report given to floor nurse, Darral Dash, RN. Pt being prepared for transport.

## 2012-10-12 NOTE — ED Notes (Signed)
Clinical Social Work Department BRIEF PSYCHOSOCIAL ASSESSMENT 10/12/2012  Patient:  Connie Miranda, Connie Miranda     Account Number:  [id number]     Admit date:  10/11/2012  Clinical Social Worker:  Illene Silver  Date/Time:  10/12/2012 09:43 PM  Referred by:  CSW  Date Referred:  10/12/2012 Referred for  SNF Placement   Other Referral:   Interview type:  Patient Other interview type:    PSYCHOSOCIAL DATA Living Status:  ALONE Admitted from facility:   Level of care:   Primary support name:   Primary support relationship to patient:  FRIEND Degree of support available:   pt lives alone, no family to help, has a friend who helps her run errands    CURRENT CONCERNS Current Concerns  Post-Acute Placement   Other Concerns:    SOCIAL WORK ASSESSMENT / PLAN CSW met with pt in ED to discuss role of CSW/dcp.  pt was d/c from the ED via [facility] and returned to ED via [facility] when they discovered she could not walk and no one was in the home to help her.  Pt is agreeable to placement if it would help her, but she eventually wants to be back in her home.   Assessment/plan status:  Psychosocial Support/Ongoing Assessment of Needs Other assessment/ plan:   CSW to assess for NHP once PT/OT c/s are complete and payor sources identified.   Information/referral to community resources:    PATIENT'S/FAMILY'S RESPONSE TO PLAN OF CARE: Pt happy to be getting out of ED.  Very pleasant and agreeable.  Knowledgeable about area facilities, including [facility] and [facility].

## 2012-10-12 NOTE — ED Notes (Signed)
MD at bedside. 

## 2012-10-12 NOTE — ED Provider Notes (Signed)
Connie Miranda is a 78 y.o. female in CDU from [facility] patient was scheduled to be discharged yesterday that she could not be discharged secondary to difficulty ambulating. She has no one available at the time to help her at home. Sign out from [facility]. Dr. Hyacinth Meeker shows pending social work consult.  9:15 AM patient resting comfortably in her room. States that she is pain-free at this time. She explains that she lives on her own and is helped by several people in her neighborhood. Her family all lives out of town but she speaks in daily by phone. She explains that she slowed down considerably with this pain that she feels that she can be discharged home safely with the help of her caretakers. Social work will be consult to assess for the safety and possible home care options for her.  Multiple attempts to ambulate the patient are unsuccessful. I think the extensive workup that was done for the lower left quadrant pain no yielding no results point to another source of her difficulty ambulating. The most likely candidate is the worsening of her lower extremity edema. She states that she is on chronic antibiotics to prevent infection. Initially has required IV antibiotics in the past. I will give her IV clindamycin.   Internal medicine consult from Dr. Malachi Bonds appreciated: She will admit the patient to a MedSurg bed.   Wynetta Emery, PA-C 10/13/12 1425

## 2012-10-12 NOTE — ED Notes (Signed)
Pt sleeping/resting, easily arousable, alert, NAD, calm, skin W&D, resps e/u, speaking in clear complete sentences, pCXR into room at [facility] (done), resting with TV on, CBIR. Denies sx or complaints.

## 2012-10-12 NOTE — ED Notes (Signed)
No changes, sleeping, VSS.  

## 2012-10-12 NOTE — Progress Notes (Signed)
Pt 79 yrs of female just got to the unit via stretcher from ED  Very incontinent with  Urine.   Assisted onto [facility] and initial care rendered   . Admission orders released and initial vs taken.

## 2012-10-13 ENCOUNTER — Observation Stay (HOSPITAL_COMMUNITY): Payer: Medicare Other

## 2012-10-13 DIAGNOSIS — L02419 Cutaneous abscess of limb, unspecified: Principal | ICD-10-CM

## 2012-10-13 DIAGNOSIS — R197 Diarrhea, unspecified: Secondary | ICD-10-CM

## 2012-10-13 DIAGNOSIS — R609 Edema, unspecified: Secondary | ICD-10-CM

## 2012-10-13 DIAGNOSIS — M25559 Pain in unspecified hip: Secondary | ICD-10-CM

## 2012-10-13 DIAGNOSIS — R109 Unspecified abdominal pain: Secondary | ICD-10-CM

## 2012-10-13 DIAGNOSIS — L03119 Cellulitis of unspecified part of limb: Principal | ICD-10-CM

## 2012-10-13 LAB — CBC
HCT: 29.1 % — ABNORMAL LOW (ref 36.0–46.0)
Hemoglobin: 9.5 g/dL — ABNORMAL LOW (ref 12.0–15.0)
MCHC: 32.6 g/dL (ref 30.0–36.0)
RDW: 13.3 % (ref 11.5–15.5)
WBC: 9.4 10*3/uL (ref 4.0–10.5)

## 2012-10-13 LAB — BASIC METABOLIC PANEL
BUN: 14 mg/dL (ref 6–23)
Chloride: 101 mEq/L (ref 96–112)
GFR calc Af Amer: 71 mL/min — ABNORMAL LOW (ref >90)
GFR calc non Af Amer: 61 mL/min — ABNORMAL LOW (ref >90)
Potassium: 4 mEq/L (ref 3.5–5.1)

## 2012-10-13 LAB — LIPASE, BLOOD: Lipase: 13 U/L (ref 11–59)

## 2012-10-13 LAB — GLUCOSE, CAPILLARY: Glucose-Capillary: 158 mg/dL — ABNORMAL HIGH (ref 70–99)

## 2012-10-13 NOTE — Evaluation (Signed)
Physical Therapy Evaluation Patient Details Name: DMIYA MALPHRUS MRN: [id number] DOB: 02-Sep-1933 Today's Date: 10/13/2012 Time: 4098-1191 PT Time Calculation (min): 29 min  PT Assessment / Plan / Recommendation Clinical Impression  Patient is a 78 yo female admitted with cellulitis bil. LE's.  Patient with difficulty with gait pta - short distances with walker/cane.  Uses rolling office chair in house to move about. Today patient limited by pain and fatigue.  Recommend SNF at discharge for continued therapy.  Patient will benefit from acute PT to maximize independence.    PT Assessment  Patient needs continued PT services    Follow Up Recommendations  Post acute inpatient    Does the patient have the potential to tolerate intense rehabilitation   No, Recommend SNF  Barriers to Discharge Decreased caregiver support      Equipment Recommendations  Wheelchair (measurements);Wheelchair cushion (measurements)    Recommendations for Other Services     Frequency Min 3X/week    Precautions / Restrictions Precautions Precautions: Fall Restrictions Weight Bearing Restrictions: No   Pertinent Vitals/Pain Pain limiting mobility      Mobility  Bed Mobility Bed Mobility: Rolling Right;Rolling Left;Supine to Sit Rolling Right: 2: Max assist Rolling Left: 2: Max assist Supine to Sit: 2: Max assist Details for Bed Mobility Assistance: Patient requiring additional assist due to pain with movement.  Unable to achieve full upright sitting position - pain with moving LLE off bed. Transfers Transfers: Not assessed           PT Diagnosis: Difficulty walking;Generalized weakness;Acute pain  PT Problem List: Decreased strength;Decreased range of motion;Decreased activity tolerance;Decreased mobility;Decreased balance;Decreased knowledge of use of DME;Cardiopulmonary status limiting activity;Obesity;Pain PT Treatment Interventions: DME instruction;Gait training;Functional mobility  training;Balance training;Patient/family education   PT Goals Acute Rehab PT Goals PT Goal Formulation: With patient Time For Goal Achievement: 10/27/12 Potential to Achieve Goals: Fair Pt will Roll Supine to Right Side: with modified independence;with rail PT Goal: Rolling Supine to Right Side - Progress: Goal set today Pt will Roll Supine to Left Side: with modified independence;with rail PT Goal: Rolling Supine to Left Side - Progress: Goal set today Pt will go Supine/Side to Sit: with min assist;with HOB 0 degrees PT Goal: Supine/Side to Sit - Progress: Goal set today Pt will go Sit to Supine/Side: with min assist;with HOB 0 degrees PT Goal: Sit to Supine/Side - Progress: Goal set today Pt will go Sit to Stand: with min assist;with upper extremity assist PT Goal: Sit to Stand - Progress: Goal set today Pt will go Stand to Sit: with min assist;with upper extremity assist PT Goal: Stand to Sit - Progress: Goal set today Pt will Ambulate: 1 - 15 feet;with min assist;with rolling walker PT Goal: Ambulate - Progress: Goal set today  Visit Information  Last PT Received On: 10/13/12 Assistance Needed: +2    Subjective Data  Subjective: "It started in my side" (pain) Patient Stated Goal: To get stronger and be able to return home.   Prior Functioning  Home Living Lives With: Alone Available Help at Discharge: Skilled Nursing Facility Home Adaptive Equipment: Straight cane;Walker - rolling;Tub transfer bench Prior Function Level of Independence: Independent with assistive device(s) Able to Take Stairs?: No Driving: No Vocation: Retired Comments: Patient's neighbor helps her out with errands, etc. Communication Communication: No difficulties    Cognition  Overall Cognitive Status: Appears within functional limits for tasks assessed/performed Arousal/Alertness: Lethargic Orientation Level: Appears intact for tasks assessed Behavior During Session: Lethargic Cognition - Other  Comments: Patient has been in testing this am and is fatigued    Extremity/Trunk Assessment Right Upper Extremity Assessment RUE ROM/Strength/Tone: WFL for tasks assessed RUE Sensation: WFL - Light Touch Left Upper Extremity Assessment LUE ROM/Strength/Tone: WFL for tasks assessed LUE Sensation: WFL - Light Touch Right Lower Extremity Assessment RLE ROM/Strength/Tone: Deficits RLE ROM/Strength/Tone Deficits: Strength grossly 3+/5; edema noted RLE Sensation: WFL - Light Touch Left Lower Extremity Assessment LLE ROM/Strength/Tone: Deficits LLE ROM/Strength/Tone Deficits: Strength grossly 3-/5; marked edema noted LLE Sensation: WFL - Light Touch   Balance    End of Session PT - End of Session Activity Tolerance: Patient limited by fatigue;Patient limited by pain Patient left: in bed;with call bell/phone within reach Nurse Communication: Mobility status;Need for lift equipment  GP Functional Assessment Tool Used: clinical judgement Functional Limitation: Mobility: Walking and moving around Mobility: Walking and Moving Around Current Status [phone]): At least 80 percent but less than 100 percent impaired, limited or restricted Mobility: Walking and Moving Around Goal Status [phone]): At least 20 percent but less than 40 percent impaired, limited or restricted   Vena Austria 10/13/2012, 12:25 PM Durenda Hurt. Renaldo Fiddler, [facility] Acute Rehab Services Pager [phone]

## 2012-10-13 NOTE — Progress Notes (Signed)
  Echocardiogram 2D Echocardiogram has been performed.  Connie Miranda 10/13/2012, 10:16 AM

## 2012-10-13 NOTE — Evaluation (Signed)
Occupational Therapy Evaluation Patient Details Name: Connie Miranda MRN: [id number] DOB: [birth year] Today's Date: 10/13/2012 Time: 4098-1191 OT Time Calculation (min): 31 min  OT Assessment / Plan / Recommendation Clinical Impression  This 78 year old female was admitted with cellulitis, abdominal pain and difficulty walking.  PTA she was mod I with adls.  Pt is appropriate for skilled OT to increase independence with adls with mod A level goals in acute    OT Assessment  Patient needs continued OT Services    Follow Up Recommendations  Skilled nursing facility    Barriers to Discharge      Equipment Recommendations  Wheelchair (measurements);Wheelchair cushion (measurements)    Recommendations for Other Services    Frequency  Min 2X/week    Precautions / Restrictions Precautions Precautions: Fall Restrictions Weight Bearing Restrictions: No   Pertinent Vitals/Pain LLE and groin 5/10, increased to 8/10 with movement--returned to 5/10 with repositioning    ADL  Grooming: Simulated;Set up Where Assessed - Grooming: Unsupported sitting Upper Body Bathing: Simulated;Set up Where Assessed - Upper Body Bathing: Unsupported sitting Lower Body Bathing: Simulated;+2 Total assistance Lower Body Bathing: Patient Percentage: 20% Where Assessed - Lower Body Bathing: Supported sit to stand Upper Body Dressing: Simulated;Minimal assistance Where Assessed - Upper Body Dressing: Unsupported sitting Lower Body Dressing: Simulated;+2 Total assistance Lower Body Dressing: Patient Percentage: 10% Where Assessed - Lower Body Dressing: Supported sit to stand Toilet Transfer: Performed;+2 Total assistance Toilet Transfer: Patient Percentage: 30% Toilet Transfer Method: Stand pivot (to R side) Acupuncturist: Materials engineer and Hygiene: Performed;+2 Total assistance Toileting - Architect and Hygiene: Patient Percentage: 0% Where  Assessed - Glass blower/designer Manipulation and Hygiene: Standing Equipment Used: Rolling walker Transfers/Ambulation Related to ADLs: Pt was up on [facility] when I arrived and nursing was trying to get her back to bed.  They had used maximove but were trying to spt pt.  Moved bed close and assisted RN with transfer.  Pt had great difficulty pivoting right foot.  Unit does not have Corene Cornea:  if they cannot borrow, they could use [employer].  Pt has a lot of pain in LLE.  Bil LEs are weeping and sores present. ADL Comments: Pt was independent with adls PTA.  Having difficulty with LB ADLs and needing A x 2 to stand now    OT Diagnosis: Generalized weakness  OT Problem List: Decreased strength;Decreased activity tolerance;Pain;Decreased knowledge of use of DME or AE OT Treatment Interventions: Self-care/ADL training;DME and/or AE instruction;Therapeutic activities;Patient/family education;Balance training   OT Goals Acute Rehab OT Goals OT Goal Formulation: With patient Time For Goal Achievement: 10/27/12 Potential to Achieve Goals: Good ADL Goals Pt Will Perform Lower Body Bathing: with mod assist;with adaptive equipment;Sit to stand from bed;Unsupported ADL Goal: Lower Body Bathing - Progress: Goal set today Pt Will Perform Lower Body Dressing: with mod assist;Sit to stand from chair;Unsupported (underwear/pants with AE) ADL Goal: Lower Body Dressing - Progress: Goal set today Pt Will Transfer to Toilet: with mod assist;Stand pivot transfer;3-in-1 ADL Goal: Toilet Transfer - Progress: Goal set today Miscellaneous OT Goals Miscellaneous OT Goal #1: Pt will go from sit to stand with mod A for adls  OT Goal: Miscellaneous Goal #1 - Progress: Goal set today Miscellaneous OT Goal #2: Pt will perform bed mobility with mod A from hospital bed, in preparation for adls and transfers to 3:1 OT Goal: Miscellaneous Goal #2 - Progress: Goal set today  Visit Information  Last  OT Received On:  10/13/12 Assistance Needed: +2    Subjective Data  Subjective: "I did everything for myself.  I don't know what is wrong with this L leg" Patient Stated Goal: get back to being independent   Prior Functioning     Home Living Lives With: Alone Available Help at Discharge: Skilled Nursing Facility Bathroom Shower/Tub: Tub/shower unit Bathroom Toilet: Standard Home Adaptive Equipment: Straight cane;Walker - rolling;Tub transfer bench Prior Function Level of Independence: Independent with assistive device(s) Able to Take Stairs?: No Driving: No Vocation: Retired Comments: Patient's neighbor helps her out with errands, etc. Communication Communication: No difficulties         Vision/Perception     Cognition  Overall Cognitive Status: Appears within functional limits for tasks assessed/performed Arousal/Alertness: Awake/alert Orientation Level: Appears intact for tasks assessed Behavior During Session: [facility] for tasks performed Cognition - Other Comments: Patient has been in testing this am and is fatigued    Extremity/Trunk Assessment Right Upper Extremity Assessment RUE ROM/Strength/Tone: WFL for tasks assessed RUE Sensation: WFL - Light Touch Left Upper Extremity Assessment LUE ROM/Strength/Tone: WFL for tasks assessed LUE Sensation: WFL - Light Touch Right Lower Extremity Assessment RLE ROM/Strength/Tone: Deficits RLE ROM/Strength/Tone Deficits: Strength grossly 3+/5; edema noted RLE Sensation: WFL - Light Touch Left Lower Extremity Assessment LLE ROM/Strength/Tone: Deficits LLE ROM/Strength/Tone Deficits: Strength grossly 3-/5; marked edema noted LLE Sensation: WFL - Light Touch     Mobility Bed Mobilityt Sit to Supine: 1: +2 Total assist;HOB flat Sit to Supine: Patient Percentage: 60% Details for Bed Mobility Assistance: extra time and cues Transfers Transfers: Sit to Stand Sit to Stand: 1: +2 Total assist Sit to Stand: Patient Percentage: 40%      Shoulder Instructions     Exercise     Balance     End of Session OT - End of Session Activity Tolerance: Patient limited by fatigue;Patient limited by pain Patient left: in bed;with call bell/phone within reach Nurse Communication: Need for lift equipment  GO Functional Assessment Tool Used: clinical judgment/observation Functional Limitation: Self care Self Care Current Status (R6045): 100 percent impaired, limited or restricted Self Care Goal Status (W0981): At least 40 percent but less than 60 percent impaired, limited or restricted   Markian Glockner 10/13/2012, 4:06 PM Marica Otter, OTR/L [phone] 10/13/2012

## 2012-10-13 NOTE — Progress Notes (Addendum)
[facility] PROGRESS NOTE  Connie Miranda WUJ:[id number] DOB: 1933/06/12 DOA: 10/11/2012 PCP: No primary provider on file.  Assessment/Plan  Cellulitis of left lower extremity: Erythema extends 2-3 cm above area of chronic skin changes on left lower leg  Continue Clindamycin. Elevate extremities, LLL in particular. Recommend unna boots after no longer needing to observe for cellulitis and prior to discharge. Improving. Lot of skin changes are chronic/ Venous stasis vs Lymphedema. Wound care consulted.  Abdominal pain: Unclear etiology. Urinalysis has a LE without WBC, LFTs are wnl, and CT scan unremarkable. Concern that GE junction thickening may be related to abdominal pain, but patient denies worsening of pain with eating and recent food getting stuck in chest sensation - Barium swallow shows small hiatal hernia. Lipase normal. ? GI consult.   Hip pain due to OA: Vicodin, IV dilaudid prn.  Edema of lower extremities is likely due to venous stasis, but consider cardiac etiology as well. ON mild protein on UA and albumin not depressed. 2-D echo shows mild LVH and preserved systolic function. Continue lasix and potassium.  CVA, stable: Continue ASA and plavix   HTN, stable. Continue HCTZ, ARB, norvasc and aliskiren   Normocytic anemia, stable. Has history of pernicious anemia, but not taking supplemental b12. Defer to PCP   ?Hx of G6PD deficiency per patient report: - G6PD test with AM as may effect choice of antibiotics now or in the future.   Addendum: patient actually not having diarrhea.  Code Status: Full Family Communication: With patient Disposition Plan: ? SNF  Consultants: Wound care team  Procedures:  2-D echo  Barium swallow  Antibiotics:  Clindamycin  HPI/Subjective: Patient denies complaints. Denies lower extremity pain, chest or abdominal pain. Per nursing, incontinent of urine and multiple loose stools.  Objective: Filed Vitals:   10/12/12 1913  10/12/12 2033 10/12/12 2200 10/13/12 0600  BP: 154/96  176/53 156/50  Pulse: 87  94 99  Temp: 98.6 F (37 C)  98.8 F (37.1 C) 100.2 F (37.9 C)  TempSrc:      Resp: 20  20 20   Height:  5\' 4"  (1.626 m)    SpO2: 100%  100% 96%    Intake/Output Summary (Last 24 hours) at 10/13/12 1655 Last data filed at 10/12/12 2300  Gross per 24 hour  Intake    240 ml  Output      1 ml  Net    239 ml   There were no vitals filed for this visit.  Exam:   General exam: Moderately built and obese female patient lying comfortably supine in bed.  Respiratory system: Clear to auscultation. No increased work of breathing.  Cardiovascular system: S1 and S2 heard, regular rate and rhythm. No murmurs or JVD.  Gastrointestinal system: Abdomen is obese, soft and nontender. Normal bowel sounds heard.  Central nervous system: Alert and oriented x3. No cranial nerve deficits.  Extremities: Bilateral lower extremity significantly thickened skin below the knees from chronic edema . Some oozing in the back of the legs. Mild redness but no tenderness or warmth about the serious.  Data Reviewed: Basic Metabolic Panel:  Lab 10/13/12 9562 10/12/12 1905 10/11/12 1235  NA 135 -- 139  K 4.0 -- 4.2  CL 101 -- 103  CO2 29 -- 29  GLUCOSE 151* -- 166*  BUN 14 -- 17  CREATININE 0.89 0.92 0.93  CALCIUM 9.1 -- 9.8  MG -- -- --  PHOS -- -- --   Liver Function Tests:  Lab  10/11/12 1235  AST 24  ALT 16  ALKPHOS 85  BILITOT 0.4  PROT 8.1  ALBUMIN 3.7    Lab 10/13/12 0630  LIPASE 13  AMYLASE --   No results found for this basename: AMMONIA:5 in the last 168 hours CBC:  Lab 10/13/12 0630 10/12/12 1905 10/11/12 1235  WBC 9.4 10.0 13.5*  NEUTROABS -- -- --  HGB 9.5* 11.4* 10.8*  HCT 29.1* 35.6* 33.9*  MCV 93.9 96.5 94.7  PLT 254 298 283   Cardiac Enzymes: No results found for this basename: CKTOTAL:5,CKMB:5,CKMBINDEX:5,TROPONINI:5 in the last 168 hours BNP (last 3 results) No results found  for this basename: PROBNP:3 in the last 8760 hours CBG:  Lab 10/13/12 0645  GLUCAP 158*    No results found for this or any previous visit (from the past 240 hour(s)).   Studies: Dg Esophagus  10/13/2012  *RADIOLOGY REPORT*  Clinical Data: thickening of the GE junction on CT.  ESOPHOGRAM/BARIUM SWALLOW  Technique:  Single contrast examination was performed using .  Fluoroscopy time:  1 minute.  Comparison:  10/11/2012  Findings:  There are no fixed esophageal strictures, fold thickening or mass.  There is a small hiatal hernia noted.  No focal abnormality.  The area of fold thickening seen on prior CT cannot be appreciated on esophagram.  Occasional tertiary contractions seen within the esophagus.  IMPRESSION: Small hiatal hernia.  Cannot visualize or appreciate the fold thickening seen on CT.   Original Report Authenticated By: Cyndie Chime, M.D.    Dg Chest Port 1 View  10/12/2012  *RADIOLOGY REPORT*  Clinical Data: Shortness of breath.  PORTABLE CHEST - 1 VIEW  Comparison: None.  Findings: Severe right greater than left glenohumeral joint osteoarthritis. Cardiomegaly accentuated by AP portable technique. No pleural effusion or pneumothorax.  Low lung volumes.  This accentuates the pulmonary interstitium.  Suspicion of mild concurrent peribronchial thickening. No lobar consolidation.  IMPRESSION:  1.  Cardiomegaly and mildly low lung volumes. 2.  Suspicion of concurrent interstitial thickening, as can be seen with chronic bronchitis/smoking. No lobar consolidation.   Original Report Authenticated By: Consuello Bossier, M.D.     Scheduled Meds:    . aliskiren  300 mg Oral Daily  . amLODipine  10 mg Oral Daily  . clindamycin (CLEOCIN) IV  600 mg Intravenous Once  . clopidogrel  75 mg Oral Daily  . docusate sodium  100 mg Oral BID  . heparin  5,000 Units Subcutaneous Q8H  . irbesartan  300 mg Oral Daily   And  . hydrochlorothiazide  25 mg Oral Daily  . multivitamin with minerals  1  tablet Oral Daily  . potassium chloride  10 mEq Oral Daily  . senna  1 tablet Oral BID  . DISCONTD:  morphine injection  4 mg Intravenous Once  . DISCONTD:  morphine injection  4 mg Intravenous Once  . DISCONTD: ondansetron (ZOFRAN) IV  4 mg Intravenous Once  . DISCONTD: ondansetron (ZOFRAN) IV  4 mg Intravenous Once   Continuous Infusions:   Principal Problem:  *Cellulitis of left lower leg Active Problems:  Hypertension  Stroke  Edema  Pernicious anemia  Glucose-6-phosphate dehydrogenase deficiency  Osteoarthritis  Anemia  Abdominal pain  Hip pain        Connie Miranda  [facility] Pager [phone]. If 8PM-8AM, please contact night-coverage at [email or web address], password [facility] 10/13/2012, 4:55 PM  LOS: 2 days

## 2012-10-14 ENCOUNTER — Encounter (HOSPITAL_COMMUNITY): Payer: Self-pay

## 2012-10-14 DIAGNOSIS — R609 Edema, unspecified: Secondary | ICD-10-CM

## 2012-10-14 LAB — GLUCOSE, CAPILLARY: Glucose-Capillary: 131 mg/dL — ABNORMAL HIGH (ref 70–99)

## 2012-10-14 LAB — BASIC METABOLIC PANEL WITH GFR
BUN: 13 mg/dL (ref 6–23)
CO2: 29 meq/L (ref 19–32)
Calcium: 9.2 mg/dL (ref 8.4–10.5)
Chloride: 101 meq/L (ref 96–112)
Creatinine, Ser: 0.88 mg/dL (ref 0.50–1.10)
GFR calc Af Amer: 72 mL/min — ABNORMAL LOW (ref >90)
GFR calc non Af Amer: 62 mL/min — ABNORMAL LOW (ref >90)
Glucose, Bld: 115 mg/dL — ABNORMAL HIGH (ref 70–99)
Potassium: 3.7 meq/L (ref 3.5–5.1)
Sodium: 138 meq/L (ref 135–145)

## 2012-10-14 LAB — CBC
MCV: 94 fL (ref 78.0–100.0)
Platelets: 273 10*3/uL (ref 150–400)
RBC: 3.18 MIL/uL — ABNORMAL LOW (ref 3.87–5.11)
WBC: 8.6 10*3/uL (ref 4.0–10.5)

## 2012-10-14 MED ORDER — CHLORHEXIDINE GLUCONATE 4 % EX LIQD
Freq: Once | CUTANEOUS | Status: DC
  Filled 2012-10-14: qty 15

## 2012-10-14 MED ORDER — HYDROCERIN EX CREA
TOPICAL_CREAM | Freq: Every day | CUTANEOUS | Status: DC
  Administered 2012-10-15: 10:00:00 via TOPICAL
  Filled 2012-10-14: qty 113

## 2012-10-14 MED ORDER — CLINDAMYCIN HCL 300 MG PO CAPS
300.0000 mg | ORAL_CAPSULE | Freq: Four times a day (QID) | ORAL | Status: DC
  Administered 2012-10-14 – 2012-10-15 (×4): 300 mg via ORAL
  Filled 2012-10-14 (×7): qty 1

## 2012-10-14 NOTE — Progress Notes (Deleted)
Patients Blood Pressure 208/87. Dr. Debby Bud Notified. Received PRN orders for high blood pressure.

## 2012-10-14 NOTE — Care Management Note (Unsigned)
    Page 1 of 2   10/15/2012     2:12:39 PM   CARE MANAGEMENT NOTE 10/15/2012  Patient:  Connie Miranda, Connie Miranda   Account Number:  [id number]  Date Initiated:  10/13/2012  Documentation initiated by:  [facility]  Subjective/Objective Assessment:   Admitted with abd pain. Lives alone, uses walker and chair.     Action/Plan:   PT/OT evals-recommending SNF   Anticipated DC Date:  10/15/2012   Anticipated DC Plan:  HOME W HOME HEALTH SERVICES      DC Planning Services  CM consult      Choice offered to / List presented to:  C-1 Patient   DME arranged  WHEELCHAIR - MANUAL      DME agency  [facility]     HH arranged  HH-1 RN  HH-2 PT  HH-3 OT  HH-4 NURSE'S AIDE  HH-6 SOCIAL WORKER      HH agency  [facility]   Status of service:  In process, will continue to follow Medicare Important Message given?  YES (If response is "NO", the following Medicare IM given date fields will be blank) Date Medicare IM given:  10/14/2012 Date Additional Medicare IM given:    Discharge Disposition:  HOME W HOME HEALTH SERVICES  Per UR Regulation:  Reviewed for med. necessity/level of care/duration of stay  If discussed at Long Length of Stay Meetings, dates discussed:    Comments:  10/15/12 Confirmed that patient received wheelchair and cushion. Patient has a neighbor who is going to meet her at her home this pm when patient is transferred home via ambulance. Jacquelynn Cree RN, BSN, CCM  10/14/12 Spoke with patient about d/c plans, explained that PT/OT are recommending SNF for therapy. Explained to patient that she has not met the criteria for Medicare to cover a SNF but that she can choose to pay for the SNF. Explained HHC to patient. She stated she would rather go home with home health than to go to the SNF. She chose [facility] for [facility], PT, OT, aide, and Child psychotherapist. Patient is contacting family and friends to come stay with her at discharge. Patient states that she  has a walker at home, wheelchair and cushion requested from [facility]. Contacted [facility] for HHRN, PT, OT , aide and Child psychotherapist. Will continue to follow for d/c needs. Jacquelynn Cree RN, BSN, CCM

## 2012-10-14 NOTE — ED Provider Notes (Signed)
Medical screening examination/treatment/procedure(s) were conducted as a shared visit with non-physician practitioner(s) and myself.  I personally evaluated the patient during the encounter  Flint Melter, MD 10/14/12 [phone]

## 2012-10-14 NOTE — Progress Notes (Signed)
[facility] PROGRESS NOTE  Connie Miranda ZDG:[id number] DOB: Aug 02, 1933 DOA: 10/11/2012 PCP: No primary provider on file.  Assessment/Plan  Cellulitis of left lower extremity: Erythema extended 2-3 cm above area of chronic skin changes on left lower leg  Elevate extremities, LLL in particular. Recommend unna boots after no longer needing to observe for cellulitis and prior to discharge. Improving. Lot of skin changes are chronic/ Venous stasis vs Lymphedema. Wound care consult appreciated. Patient apparently received a dose of IV Clindamycin in ED and then inadvertently none after that/none ordered. However, legs seem to have improved. Will start oral Clindamycin to complete 7 days course.  Abdominal pain: Unclear etiology. Urinalysis has a LE without WBC, LFTs are wnl, and CT scan unremarkable. Concern that GE junction thickening may be related to abdominal pain, but patient denies worsening of pain with eating and recent food getting stuck in chest sensation - Barium swallow shows small hiatal hernia. Lipase normal. Abdominal pain has resolved.  Hip pain due to OA: Vicodin, IV dilaudid prn. No hip pain.  Edema of lower extremities is likely due to venous stasis, but consider cardiac etiology as well. ON mild protein on UA and albumin not depressed. 2-D echo shows mild LVH and preserved systolic function. Continue lasix and potassium.  CVA, stable: Continue ASA and plavix   HTN, stable. Continue HCTZ, ARB, norvasc and aliskiren   Normocytic anemia, stable. Has history of pernicious anemia, but not taking supplemental b12. Defer to PCP   ?Hx of G6PD deficiency per patient report: -    Code Status: Full Family Communication: With patient Disposition Plan: Per physical therapy, patient would require SNF level care but patient does not meet criteria for SNF admission per her insurance and she indicates that she cannot afford it out of pocket. Patient is medically stable for discharge  home today, but she indicates that she would not be able to leave until tomorrow and does not have a ride until tomorrow. DC home on 10/18.  Consultants: Wound care team  Procedures:  2-D echo  Barium swallow  Antibiotics:  Clindamycin single dose IV on 10/15  Clindamycin by mouth 10/17  HPI/Subjective: Patient denies complaints-indicates that her lower extremities are almost back to usual. Denies abdominal pain, nausea or vomiting or diarrhea. Tolerating diet well.   Objective: Filed Vitals:   10/14/12 0827 10/14/12 1053 10/14/12 1326 10/14/12 1406  BP: 151/50 154/48 147/47 133/45  Pulse: 96 88 87 81  Temp: 99.8 F (37.7 C) 98.3 F (36.8 C) 99.3 F (37.4 C) 98.1 F (36.7 C)  TempSrc:      Resp: 18 18 20 18   Height:      Weight:      SpO2: 97% 100% 94% 98%    Intake/Output Summary (Last 24 hours) at 10/14/12 1856 Last data filed at 10/14/12 1852  Gross per 24 hour  Intake    720 ml  Output      0 ml  Net    720 ml   Filed Weights   10/14/12 0557  Weight: 128.3 kg (282 lb 13.6 oz)    Exam:   General exam: Moderately built and obese female patient lying comfortably supine in bed.  Respiratory system: Clear to auscultation. No increased work of breathing.  Cardiovascular system: S1 and S2 heard, regular rate and rhythm. No murmurs or JVD.  Gastrointestinal system: Abdomen is obese, soft and nontender. Normal bowel sounds heard.  Central nervous system: Alert and oriented x3. No cranial nerve deficits.  Extremities: Skin changes consistent with end-stage lymphedema bilaterally. No active oozing at this time. Patient did have some yellow drainage earlier this week per wound care team. Approximately 3 x 2 x 0.1 cm partial-thickness clean ulcer left posterior calf.? Faint redness about chronic left leg changes but no warmth or tenderness.   Data Reviewed: Basic Metabolic Panel:  Lab 10/14/12 1610 10/13/12 0630 10/12/12 1905 10/11/12 1235  NA 138 135 --  139  K 3.7 4.0 -- 4.2  CL 101 101 -- 103  CO2 29 29 -- 29  GLUCOSE 115* 151* -- 166*  BUN 13 14 -- 17  CREATININE 0.88 0.89 0.92 0.93  CALCIUM 9.2 9.1 -- 9.8  MG -- -- -- --  PHOS -- -- -- --   Liver Function Tests:  Lab 10/11/12 1235  AST 24  ALT 16  ALKPHOS 85  BILITOT 0.4  PROT 8.1  ALBUMIN 3.7    Lab 10/13/12 0630  LIPASE 13  AMYLASE --   No results found for this basename: AMMONIA:5 in the last 168 hours CBC:  Lab 10/14/12 0610 10/13/12 0630 10/12/12 1905 10/11/12 1235  WBC 8.6 9.4 10.0 13.5*  NEUTROABS -- -- -- --  HGB 9.8* 9.5* 11.4* 10.8*  HCT 29.9* 29.1* 35.6* 33.9*  MCV 94.0 93.9 96.5 94.7  PLT 273 254 298 283   Cardiac Enzymes: No results found for this basename: CKTOTAL:5,CKMB:5,CKMBINDEX:5,TROPONINI:5 in the last 168 hours BNP (last 3 results) No results found for this basename: PROBNP:3 in the last 8760 hours CBG:  Lab 10/14/12 0646 10/13/12 0645  GLUCAP 131* 158*    No results found for this or any previous visit (from the past 240 hour(s)).   Studies: Dg Esophagus  10/13/2012  *RADIOLOGY REPORT*  Clinical Data: thickening of the GE junction on CT.  ESOPHOGRAM/BARIUM SWALLOW  Technique:  Single contrast examination was performed using .  Fluoroscopy time:  1 minute.  Comparison:  10/11/2012  Findings:  There are no fixed esophageal strictures, fold thickening or mass.  There is a small hiatal hernia noted.  No focal abnormality.  The area of fold thickening seen on prior CT cannot be appreciated on esophagram.  Occasional tertiary contractions seen within the esophagus.  IMPRESSION: Small hiatal hernia.  Cannot visualize or appreciate the fold thickening seen on CT.   Original Report Authenticated By: Cyndie Chime, M.D.     Scheduled Meds:    . aliskiren  300 mg Oral Daily  . amLODipine  10 mg Oral Daily  . chlorhexidine   Topical Once  . clopidogrel  75 mg Oral Daily  . heparin  5,000 Units Subcutaneous Q8H  . hydrocerin   Topical Daily   . irbesartan  300 mg Oral Daily   And  . hydrochlorothiazide  25 mg Oral Daily  . multivitamin with minerals  1 tablet Oral Daily  . potassium chloride  10 mEq Oral Daily   Continuous Infusions:   Principal Problem:  *Cellulitis of left lower leg Active Problems:  Hypertension  Stroke  Edema  Pernicious anemia  Glucose-6-phosphate dehydrogenase deficiency  Osteoarthritis  Anemia  Abdominal pain  Hip pain  Diarrhea        Bodi Palmeri  [facility] Pager [phone]. If 8PM-8AM, please contact night-coverage at [email or web address], password [facility] 10/14/2012, 6:56 PM  LOS: 3 days

## 2012-10-14 NOTE — Progress Notes (Signed)
Physical Therapy Treatment Patient Details Name: Connie Miranda MRN: [id number] DOB: 1933/08/08 Today's Date: 10/14/2012 Time: 4098-1191 PT Time Calculation (min): 25 min  PT Assessment / Plan / Recommendation Comments on Treatment Session  Patient was able to walk short distanced with RW pta.  Now, patient requiring +2 total assist to transfer bed to chair (nursing using lift equipment for transfers).  Patient at high falls risk at this point.  Do not feel patient is safe to return home alone. Continue to recommend SNF at discharge for continued therapy.    Follow Up Recommendations  Post acute inpatient     Does the patient have the potential to tolerate intense rehabilitation  No, Recommend SNF  Barriers to Discharge        Equipment Recommendations  Wheelchair (measurements);Wheelchair cushion (measurements)    Recommendations for Other Services    Frequency Min 3X/week   Plan Discharge plan remains appropriate;Frequency remains appropriate    Precautions / Restrictions Precautions Precautions: Fall Restrictions Weight Bearing Restrictions: No   Pertinent Vitals/Pain Pain limiting mobility    Mobility  Bed Mobility Bed Mobility: Supine to Sit;Sitting - Scoot to Edge of Bed Supine to Sit: 2: Max assist;HOB elevated;With rails Sitting - Scoot to Edge of Bed: 2: Max assist;With rail Details for Bed Mobility Assistance: Increased time required to move to sitting.  Used elevated HOB to assist with transition of trunk, and used sheet around left foot to try to move her own LLE.  Still required max assist with transitions. Transfers Transfers: [employer] Transfers: 1: +2 Total assist;From elevated surface;With upper extremity assistance Squat Pivot Transfers: Patient Percentage: 50% Details for Transfer Assistance: Patient required +2 assist to come to partial standing position.  Patient then attempted to shuffle feet to turn toward chair - required +2  assist to maintain balance and remain upright.  Able to shuffle feet x2, then required +2 total assist  to turn hips toward chair and control descent into seat. Ambulation/Gait Ambulation/Gait Assistance: Not tested (comment)      PT Goals Acute Rehab PT Goals PT Goal: Supine/Side to Sit - Progress: Progressing toward goal PT Goal: Sit to Stand - Progress: Progressing toward goal PT Goal: Stand to Sit - Progress: Progressing toward goal Pt will Transfer Bed to Chair/Chair to Bed: with min assist PT Transfer Goal: Bed to Chair/Chair to Bed - Progress: Goal set today PT Goal: Ambulate - Progress: Progressing toward goal  Visit Information  Last PT Received On: 10/14/12 Assistance Needed: +2    Subjective Data  Subjective: "Why am I having all this pain?"   Cognition  Overall Cognitive Status: Appears within functional limits for tasks assessed/performed Arousal/Alertness: Awake/alert Orientation Level: Appears intact for tasks assessed Behavior During Session: [facility] for tasks performed    Balance  Balance Balance Assessed: Yes Static Standing Balance Static Standing - Balance Support: Right upper extremity supported;Left upper extremity supported Static Standing - Level of Assistance: 1: +2 Total assist Static Standing - Comment/# of Minutes: Patient able to hold weight on LE's x2 minutes during transfer.  Unable to achieve fully upright standing position (flexed at hips).  Required +2 assist to maintain standing balance.  End of Session PT - End of Session Equipment Utilized During Treatment: Gait belt Activity Tolerance: Patient limited by fatigue;Patient limited by pain Patient left: in chair;with call bell/phone within reach;with nursing in room Nurse Communication: Mobility status;Need for lift equipment   GP     Vena Austria 10/14/2012,  2:29 PM Durenda Hurt. Renaldo Fiddler, [facility] Acute Rehab Services Pager [phone]

## 2012-10-14 NOTE — Consult Note (Signed)
WOC consult Note Reason for Consult: Consult requested for bilat legs. Pt has skin changes consistent with end stage lymphedema.  Dry scabbed skin, generalized edema with peau 'de orange raised skin to both calves.  Mod amt yellow drainage was leaking from legs earlier this week and she developed generalized erythremia to right calf. Previous area of cellulitis which was marked has improved since antibiotics were started. Left ankle with fissure near anterior ankle in skin fold.  Linear crack with red wound bed and no odor or drainage. Wound type: Left posterior calf with partial thickness stasis ulcer 3X2X.1cm.  Pt states she had a previous blister to this site which ruptured. Presently no other areas of open wounds or drainage, no odor.  Skin changes appear to be end-stage and I do not think compression wraps would be successful in altering the lack of lymph circulation that has already occurred at this time. Dressing procedure/placement/frequency: Discussed use of antibacterial soap and showering, then rubbing with towel to assist with removal of dead skin and application of Eucerin cream to provide moisture and avoid further cracking.  Foam dressing to promote healing to left posterior leg. Will not plan to follow further unless re-consulted.  [address], RN, MSN, [employer]  [phone]

## 2012-10-15 DIAGNOSIS — I10 Essential (primary) hypertension: Secondary | ICD-10-CM

## 2012-10-15 LAB — GLUCOSE 6 PHOSPHATE DEHYDROGENASE: G6PDH: 5.4 U/g Hgb — ABNORMAL LOW (ref 7.0–20.5)

## 2012-10-15 MED ORDER — CLINDAMYCIN HCL 300 MG PO CAPS: 300.0000 mg | ORAL_CAPSULE | Freq: Four times a day (QID) | ORAL | Status: DC

## 2012-10-15 MED ORDER — HYDROCERIN EX CREA: 1.0000 "application " | TOPICAL_CREAM | Freq: Every day | CUTANEOUS | Status: DC

## 2012-10-15 NOTE — Progress Notes (Signed)
Physical Therapy Treatment Patient Details Name: Connie Miranda MRN: [id number] DOB: December 08, 1933 Today's Date: 10/15/2012 Time: 4098-1191 PT Time Calculation (min): 29 min  PT Assessment / Plan / Recommendation Comments on Treatment Session  Patient has been denied by insurance and now is being discharged home today. Spoke with case manager and she stated that had gotten all equipment and [facility] services that the patient could recieve and that they are planning an ambulance transfer home. Patient unaware of needs at home and states at this time that her neice will be with her and that she will have others helping her out.     Follow Up Recommendations  Post acute inpatient     Does the patient have the potential to tolerate intense rehabilitation  No, Recommend SNF  Barriers to Discharge        Equipment Recommendations  Wheelchair cushion (measurements);Wheelchair (measurements)    Recommendations for Other Services    Frequency Min 3X/week   Plan Frequency remains appropriate;Discharge plan remains appropriate    Precautions / Restrictions Precautions Precautions: Fall Restrictions Weight Bearing Restrictions: No   Pertinent Vitals/Pain     Mobility  Bed Mobility Supine to Sit: 4: Min assist;With rails Details for Bed Mobility Assistance: Patient unable to complete without rail and with rail still requiring Min A for L LE placement. Patient states she holds onto a chair at home.  Transfers Transfers: Stand to [employer] Transfers Sit to Stand: 3: Mod assist;With upper extremity assist;From bed Stand to Sit: 3: Mod assist;With upper extremity assist;To chair/3-in-1 Squat Pivot Transfers: 1: +2 Total assist Squat Pivot Transfers: Patient Percentage: 70% Details for Transfer Assistance: Patient requiring max cues for safety and positioning with all aspects of transfer. Patient attempting to sit long before close to recliner. Patient unable to stand fully upright and unable  to pick up both feet with transfer and slides her feet to position them to recliner.     Exercises     PT Diagnosis:    PT Problem List:   PT Treatment Interventions:     PT Goals Acute Rehab PT Goals PT Goal: Supine/Side to Sit - Progress: Progressing toward goal PT Goal: Sit to Stand - Progress: Progressing toward goal PT Goal: Stand to Sit - Progress: Progressing toward goal PT Transfer Goal: Bed to Chair/Chair to Bed - Progress: Progressing toward goal PT Goal: Ambulate - Progress: Progressing toward goal  Visit Information  Last PT Received On: 10/15/12 Assistance Needed: +2 (safety and ambulation)    Subjective Data      Cognition  Overall Cognitive Status: Impaired Area of Impairment: Awareness of deficits;Executive functioning Arousal/Alertness: Awake/alert Orientation Level: Appears intact for tasks assessed Behavior During Session: [facility] for tasks performed Cognition - Other Comments: Patient stating she is able to clean herself and move around and get out of bed with the "stuff" she has at home.     Balance     End of Session PT - End of Session Equipment Utilized During Treatment: Gait belt Activity Tolerance: Patient limited by fatigue Patient left: in chair Nurse Communication: Mobility status   GP     Fredrich Birks 10/15/2012, 11:58 AM 10/15/2012 Fredrich Birks PTA [phone] pager [phone] office

## 2012-10-15 NOTE — Clinical Social Work Note (Signed)
Pt discharging home with home health per RNCM. CSW contacted ambulance for transportation at dc. Pt stated that care giver will be at her home at 3pm.    Halo Laski Collier, LCSW [phone] 

## 2012-10-15 NOTE — Progress Notes (Signed)
[facility]  Whl chr has been completed. And added cushion

## 2012-10-15 NOTE — Discharge Summary (Signed)
Physician Discharge Summary  Connie Miranda AVW:[id number] DOB: Apr 30, 1933 DOA: 10/11/2012  PCP: Connie German, MD  Admit date: 10/11/2012 Discharge date: 10/15/2012  Recommendations for Outpatient Follow-up:  1. Follow up with Dr. [name], PCP in 1 week from hospital discharge.  Discharge Diagnoses:  Principal Problem:  *Cellulitis of left lower leg Active Problems:  Hypertension  Stroke  Edema  Pernicious anemia  Glucose-6-phosphate dehydrogenase deficiency  Osteoarthritis  Anemia  Abdominal pain  Hip pain  Diarrhea   Discharge Condition: Improved and stable.  Diet recommendation: Heart healthy  Filed Weights   10/14/12 0557  Weight: 128.3 kg (282 lb 13.6 oz)    History of present illness:  78yo female with history of HTN, stroke without residual deficit, pernicious anemia, possible G6PD deficiency, and osteoarthritis who presents with abdominal pain and difficulty ambulating. She is alone and is a poor historian. She states that about 4 days prior to admission she developed sudden onset 10/10 pain in the left upper quadrant that lasted about 5 minutes. The pain was so severe that she stopped what she was doing and rested, which helped the pain go away. Her pain returned and hour later "with a vengeance." Bending over helped her pain so she sat down in a recliner and was able to fall asleep. She woke up in Sunday in the same chair with the same pain which came and went about 5 times during the day. She tried taking vicodin with little relief. She also had some difficulty ambulating secondary to her feet being heavy. She normally scoots around in a wheelchair or she uses a cane to ambulate very short distances. The day prior to admission, she decided she should have her recurrent abdominal pain evaluated so she called a friend and then called EMS to be taken to the ER. She received morphine and had an abdominal CT which was notable for thickening at the esophagogastric  junction but no other explanation for her symptoms. She also endorsed some left hip pain which is stable from prior and XR of hip confirmed OA.   Regarding esophagogastric junction thickening: Food gets stuck sometimes when she eats in the chest area, but the last time this happened was several years ago. She states she eats slowly to prevent food from getting stuck. Occasionally she regurgitates food, but last time was several years ago. Denies heartburn, but gets indigestion when drinking soda.   She states her legs are less swollen and the usual amount of redness over the last several days. In the ER, she was given clindamycin for lower extremity cellulitis several hours prior to hospitalist examination.    Hospital Course:  Cellulitis of left lower extremity: Erythema extended 2-3 cm above area of chronic skin changes on left lower leg Elevate extremities, LLL in particular. Lot of skin changes are chronic secondary to Lymphedema. Wound care consult appreciated. They have educated patient on care of her legs. Cellulitis has significantly improved. Will complete an additional 5 days of oral Cipro. Abdominal pain: Unclear etiology. Urinalysis has a LE without WBC, LFTs are wnl, and CT scan unremarkable. Barium swallow shows small hiatal hernia. Lipase normal. Abdominal pain has resolved. Patient indicates that she has an appointment to see Dr. Randa Miranda from [facility] in the couple of weeks for colonoscopy. May consider further evaluation of upper GI tract at that time. Small hiatal hernia on  barium swallow: Asymptomatic at this time. Outpatient followup with PCP/GI. Hip pain due to OA:  HAS not complained of  significant pain for a couple of days.   Edema of lower extremities is likely due to  lymphedema . 2-D echo shows mild LVH and preserved systolic function. Continue lasix and potassium.  CVA, stable: Continue ASA and plavix  HTN, stable. Continue HCTZ, ARB, norvasc and aliskiren.  Interestingly  patient is on 2 different ARB's  -will defer management to PCP. Creatinine stable.   Normocytic anemia, stable. Has history of pernicious anemia, but not taking supplemental b12.  stable.Defer to PCP  ?Hx of G6PD deficiency per patient report: -  Wall thickening of GE junction on CT: Outpatient followup with GI for further followup or evaluation. Sigmoid diverticulosis on CT.  Consultants:  Wound care team   Procedures:  2-D echo  Barium swallow   Antibiotics:  Clindamycin single dose IV on 10/15  Clindamycin by mouth 10/17   Discharge Exam: Filed Vitals:   10/14/12 1406 10/14/12 2204 10/15/12 0459 10/15/12 0954  BP: 133/45 131/55 142/65 143/51  Pulse: 81 73 80 80  Temp: 98.1 F (36.7 C) 98.8 F (37.1 C) 99.2 F (37.3 C) 99.7 F (37.6 C)  TempSrc:  Oral Oral Oral  Resp: 18 20 18 18   Height:      Weight:      SpO2: 98% 96% 96% 99%    General exam: Moderately built and obese female patient lying comfortably supine in bed.  Respiratory system: Clear to auscultation. No increased work of breathing.  Cardiovascular system: S1 and S2 heard, regular rate and rhythm. No murmurs or JVD.  Gastrointestinal system: Abdomen is obese, soft and nontender. Normal bowel sounds heard.  Central nervous system: Alert and oriented x3. No cranial nerve deficits.  Extremities: Skin changes consistent with end-stage lymphedema bilaterally. No active oozing at this time. Patient did have some yellow drainage earlier this week per wound care team. Approximately 3 x 2 x 0.1 cm partial-thickness clean ulcer left posterior calf.? Faint redness about chronic left leg changes but no warmth or tenderness.  Discharge Instructions      Discharge Orders    Future Orders Please Complete By Expires   Diet - low sodium heart healthy      Increase activity slowly      Discharge wound care:      Comments:   Please follow instructions provided to you by the Wound Care nurse in the hospital.  Wash legs  daily with antibacterial soap and water. (Dial) Gently scrub with towel after washing to remove loose dead skin, then apply generous layer of Eucerin cream to both legs daily.   Call MD for:  temperature >100.4      Call MD for:  severe uncontrolled pain      Call MD for:  redness, tenderness, or signs of infection (pain, swelling, redness, odor or green/yellow discharge around incision site)          Medication List     As of 10/15/2012  1:09 PM    TAKE these medications         aliskiren 300 MG tablet   Commonly known as: TEKTURNA   Take 300 mg by mouth daily.      amLODipine 10 MG tablet   Commonly known as: NORVASC   Take 10 mg by mouth daily.      BIOTIN PO   Take 1 tablet by mouth daily.      CALCIUM + D PO   Take 1 tablet by mouth daily.      clindamycin 300 MG  capsule   Commonly known as: CLEOCIN   Take 1 capsule (300 mg total) by mouth every 6 (six) hours.      clopidogrel 75 MG tablet   Commonly known as: PLAVIX   Take 75 mg by mouth daily.      furosemide 40 MG tablet   Commonly known as: LASIX   Take 40 mg by mouth daily as needed. fluid      hydrocerin Crea   Apply 1 application topically daily. Apply topically daily. Wash legs Q day with antibacterial soap and water. (Dial) Gently scrub with towel after washing to remove loose dead skin, then apply generous layer of Eucerin cream to BLE Q day.      HYDROcodone-acetaminophen 5-500 MG per tablet   Commonly known as: VICODIN   Take 1 tablet by mouth 2 (two) times daily as needed. pain      multivitamin with minerals Tabs   Take 1 tablet by mouth daily.      olmesartan-hydrochlorothiazide 40-25 MG per tablet   Commonly known as: BENICAR HCT   Take 1 tablet by mouth daily.      potassium chloride 10 MEQ tablet   Commonly known as: K-DUR,KLOR-CON   Take 10 mEq by mouth daily.      PRESCRIPTION MEDICATION   Take 1 tablet by mouth 3 (three) times daily as needed. Flexeril For Muscle spasm.          Follow-up Information    Follow up with AVBUERE,EDWIN A, MD. Schedule an appointment as soon as possible for a visit in 1 week.   Contact information:   [address] [place] [address] [phone]           The results of significant diagnostics from this hospitalization (including imaging, microbiology, ancillary and laboratory) are listed below for reference.    Significant Diagnostic Studies: Dg Hip Complete Left  10/11/2012  *RADIOLOGY REPORT*  Clinical Data: Pain in the left hip for 2 days.  No known injury. Unable to move or rotate legs.  LEFT HIP - COMPLETE 2+ VIEW  Comparison: None.  Findings: AP and lateral views of the left hip include an AP view of the pelvis.  There is contrast within the small bowel loops in anticipation of CT exam ordered later today.  Degenerative changes are seen in the hip.  There is no evidence for acute fracture or subluxation.  Of overlying the central pelvis, there is an intrauterine device.  IMPRESSION:  1.  No evidence for acute abnormality of the left hip. 2.  Degenerative changes. 3.  Intrauterine device overlies the central pelvis. 3.  Contrast within small bowel, obscuring detail in the lower abdomen pelvis.   Original Report Authenticated By: Patterson Hammersmith, M.D.    Ct Abdomen Pelvis W Contrast  10/11/2012  *RADIOLOGY REPORT*  Clinical Data: Left lower quadrant pain for 3 days, left hip pain for 1 day, chronic bilateral pitting edema  CT ABDOMEN AND PELVIS WITH CONTRAST  Technique:  Multidetector CT imaging of the abdomen and pelvis was performed following the standard protocol during bolus administration of intravenous contrast.  Contrast: 80mL OMNIPAQUE IOHEXOL 300 MG/ML  SOLN Dilute oral contrast.  Comparison: None  Findings: Minimal bibasilar atelectasis. Calcified granulomata within liver and spleen. Probable small cyst left kidney 11 x 8 mm image 35. Liver, spleen, pancreas, kidneys, and adrenal glands otherwise normal  appearance. Normal appendix. Small hiatal hernia with wall thickening at the gastroesophageal junction, question mass versus inflammatory process  or artifact from underdistension. IUD within uterus. Bladder and adnexae unremarkable.  Few sigmoid diverticula are identified without definite evidence of diverticulitis. Numerous normal inguinal lymph nodes bilaterally. Minimal atherosclerotic calcification aorta. No mass, adenopathy, free fluid or inflammatory process. No hernia or acute bony lesion. Multilevel degenerative disc and facet disease changes of the lumbar spine with multifactorial spinal stenosis at L3-L4, L4-L5, and L5-S1.  IMPRESSION: Old granulomatous disease. Minimal sigmoid diverticulosis without evidence of diverticulitis. Wall thickening at gastroesophageal junction question inflammatory process, tumor, or underdistension artifact; recommend EGD or barium esophagram to assess. No definite cause for left hip/left lower quadrant abdominal pain identified. Degenerative disc disease changes lumbar spine with multilevel spinal stenosis.   Original Report Authenticated By: Lollie Marrow, M.D.    Dg Esophagus  10/13/2012  *RADIOLOGY REPORT*  Clinical Data: thickening of the GE junction on CT.  ESOPHOGRAM/BARIUM SWALLOW  Technique:  Single contrast examination was performed using .  Fluoroscopy time:  1 minute.  Comparison:  10/11/2012  Findings:  There are no fixed esophageal strictures, fold thickening or mass.  There is a small hiatal hernia noted.  No focal abnormality.  The area of fold thickening seen on prior CT cannot be appreciated on esophagram.  Occasional tertiary contractions seen within the esophagus.  IMPRESSION: Small hiatal hernia.  Cannot visualize or appreciate the fold thickening seen on CT.   Original Report Authenticated By: Cyndie Chime, M.D.    Dg Chest Port 1 View  10/12/2012  *RADIOLOGY REPORT*  Clinical Data: Shortness of breath.  PORTABLE CHEST - 1 VIEW  Comparison:  None.  Findings: Severe right greater than left glenohumeral joint osteoarthritis. Cardiomegaly accentuated by AP portable technique. No pleural effusion or pneumothorax.  Low lung volumes.  This accentuates the pulmonary interstitium.  Suspicion of mild concurrent peribronchial thickening. No lobar consolidation.  IMPRESSION:  1.  Cardiomegaly and mildly low lung volumes. 2.  Suspicion of concurrent interstitial thickening, as can be seen with chronic bronchitis/smoking. No lobar consolidation.   Original Report Authenticated By: Consuello Bossier, M.D.    2-D echocardiogram  Study Conclusions  Left ventricle: The cavity size was normal. Wall thickness was increased in a pattern of mild LVH. There was mild focal basal hypertrophy of the septum. Systolic function was normal. The estimated ejection fraction was in the range of 60% to 65%. There was dynamic obstruction. Wall motion was normal; there were no regional wall motion abnormalities. Doppler parameters are consistent with abnormal left ventricular relaxation (grade 1 diastolic dysfunction).    Microbiology: No results found for this or any previous visit (from the past 240 hour(s)).   Labs: Basic Metabolic Panel:  Lab 10/14/12 1610 10/13/12 0630 10/12/12 1905 10/11/12 1235  NA 138 135 -- 139  K 3.7 4.0 -- 4.2  CL 101 101 -- 103  CO2 29 29 -- 29  GLUCOSE 115* 151* -- 166*  BUN 13 14 -- 17  CREATININE 0.88 0.89 0.92 0.93  CALCIUM 9.2 9.1 -- 9.8  MG -- -- -- --  PHOS -- -- -- --   Liver Function Tests:  Lab 10/11/12 1235  AST 24  ALT 16  ALKPHOS 85  BILITOT 0.4  PROT 8.1  ALBUMIN 3.7    Lab 10/13/12 0630  LIPASE 13  AMYLASE --   No results found for this basename: AMMONIA:5 in the last 168 hours CBC:  Lab 10/14/12 0610 10/13/12 0630 10/12/12 1905 10/11/12 1235  WBC 8.6 9.4 10.0 13.5*  NEUTROABS -- -- -- --  HGB 9.8* 9.5* 11.4* 10.8*  HCT 29.9* 29.1* 35.6* 33.9*  MCV 94.0 93.9 96.5 94.7  PLT 273 254 298 283    Cardiac Enzymes: No results found for this basename: CKTOTAL:5,CKMB:5,CKMBINDEX:5,TROPONINI:5 in the last 168 hours BNP: BNP (last 3 results) No results found for this basename: PROBNP:3 in the last 8760 hours CBG:  Lab 10/14/12 0646 10/13/12 0645  GLUCAP 131* 158*   UA on admission: 0-2 white blood cells, 3-6 red blood cells, many squamous epithelial cells and rare bacteria. Protein 30 mg/dL.   Time coordinating discharge:  Less than 30 minutes.  Signed:  Yianni Skilling  [facility] 10/15/2012, 1:09 PM

## 2012-10-15 NOTE — Progress Notes (Signed)
Pt provided with d/c isntructions and education. Pt verbalizes understanding. Pt demonstrated how to care for lower extremities by washing legs and applying eucerin cream. Pt has no questions about that at this time. IV removed with tip intact. Pt dressed and ready for ride that is scheduled at 230pm. Will continue to monitor patient closely until pt leaves floor. Kandas Oliveto Katheren Puller, RN

## 2012-10-16 ENCOUNTER — Observation Stay (HOSPITAL_COMMUNITY)
Admission: EM | Admit: 2012-10-16 | Discharge: 2012-10-19 | Payer: Medicare Other | Attending: Internal Medicine | Admitting: Internal Medicine

## 2012-10-16 ENCOUNTER — Encounter (HOSPITAL_COMMUNITY): Payer: Self-pay

## 2012-10-16 DIAGNOSIS — L02419 Cutaneous abscess of limb, unspecified: Secondary | ICD-10-CM | POA: Insufficient documentation

## 2012-10-16 DIAGNOSIS — R197 Diarrhea, unspecified: Secondary | ICD-10-CM

## 2012-10-16 DIAGNOSIS — I10 Essential (primary) hypertension: Secondary | ICD-10-CM | POA: Diagnosis present

## 2012-10-16 DIAGNOSIS — L03119 Cellulitis of unspecified part of limb: Secondary | ICD-10-CM | POA: Insufficient documentation

## 2012-10-16 DIAGNOSIS — R109 Unspecified abdominal pain: Secondary | ICD-10-CM

## 2012-10-16 DIAGNOSIS — I890 Lymphedema, not elsewhere classified: Principal | ICD-10-CM | POA: Diagnosis present

## 2012-10-16 DIAGNOSIS — D75A Glucose-6-phosphate dehydrogenase (G6PD) deficiency without anemia: Secondary | ICD-10-CM

## 2012-10-16 DIAGNOSIS — Z8673 Personal history of transient ischemic attack (TIA), and cerebral infarction without residual deficits: Secondary | ICD-10-CM | POA: Insufficient documentation

## 2012-10-16 DIAGNOSIS — N179 Acute kidney failure, unspecified: Secondary | ICD-10-CM | POA: Insufficient documentation

## 2012-10-16 DIAGNOSIS — D510 Vitamin B12 deficiency anemia due to intrinsic factor deficiency: Secondary | ICD-10-CM

## 2012-10-16 DIAGNOSIS — M25559 Pain in unspecified hip: Secondary | ICD-10-CM

## 2012-10-16 DIAGNOSIS — D649 Anemia, unspecified: Secondary | ICD-10-CM

## 2012-10-16 DIAGNOSIS — I639 Cerebral infarction, unspecified: Secondary | ICD-10-CM

## 2012-10-16 DIAGNOSIS — E860 Dehydration: Secondary | ICD-10-CM | POA: Insufficient documentation

## 2012-10-16 DIAGNOSIS — L0390 Cellulitis, unspecified: Secondary | ICD-10-CM | POA: Diagnosis present

## 2012-10-16 DIAGNOSIS — R609 Edema, unspecified: Secondary | ICD-10-CM

## 2012-10-16 DIAGNOSIS — L03116 Cellulitis of left lower limb: Secondary | ICD-10-CM

## 2012-10-16 DIAGNOSIS — M1990 Unspecified osteoarthritis, unspecified site: Secondary | ICD-10-CM | POA: Diagnosis present

## 2012-10-16 LAB — BASIC METABOLIC PANEL
BUN: 24 mg/dL — ABNORMAL HIGH (ref 6–23)
Chloride: 100 mEq/L (ref 96–112)
Glucose, Bld: 163 mg/dL — ABNORMAL HIGH (ref 70–99)
Potassium: 3.5 mEq/L (ref 3.5–5.1)
Sodium: 137 mEq/L (ref 135–145)

## 2012-10-16 LAB — CBC
HCT: 34 % — ABNORMAL LOW (ref 36.0–46.0)
Hemoglobin: 11 g/dL — ABNORMAL LOW (ref 12.0–15.0)
RBC: 3.58 MIL/uL — ABNORMAL LOW (ref 3.87–5.11)
WBC: 11.6 10*3/uL — ABNORMAL HIGH (ref 4.0–10.5)

## 2012-10-16 NOTE — ED Provider Notes (Signed)
I saw and evaluated the patient, reviewed the resident's note and I agree with the findings and plan.  Agree with EKG interpretation if present.   Pt with chronic severe LE lymphedema recently discharged home after 4 day admission for cellulitis reportedly did not qualify for SNF placement and has not had any help at home until today when family arrived from out of town. Pt has home health referral but they have not been to the home to assess her yet. She is unable to perform ADLs.   Arya Boxley B. Bernette Mayers, MD 10/16/12 2221

## 2012-10-16 NOTE — ED Notes (Signed)
Attempted two IVs and was unsuccessful.  Asked a second RN to try an IV.

## 2012-10-16 NOTE — ED Notes (Signed)
Family at bedside. 

## 2012-10-16 NOTE — ED Provider Notes (Signed)
History     CSN: [id number]  Arrival date & time 10/16/12  1614   First MD Initiated Contact with Patient 10/16/12 1852      Chief Complaint  Patient presents with  . Cellulitis    (Consider location/radiation/quality/duration/timing/severity/associated sxs/prior treatment) Patient is a 78 y.o. female presenting with rash. The history is provided by the patient and a relative.  Rash  This is a chronic problem. The current episode started yesterday. The problem has been gradually worsening. The problem is associated with nothing. There has been no fever. The rash is present on the left lower leg and right lower leg. The patient is experiencing no pain. Pertinent negatives include no blisters, no itching and no pain. Treatments tried: antibiotics. The treatment provided no relief. Risk factors include a change in medications (Pt started on antibiotics several days ago).    Past Medical History  Diagnosis Date  . Hypertension   . Stroke 09/2000    couldn't move her left foot which resolved  . Edema   . Pernicious anemia   . Glucose-6-phosphate dehydrogenase deficiency     possible diagnosis of G6PD per patient "phosphate 6 deficiency"  . Osteoarthritis     Past Surgical History  Procedure Date  . Other surgical history     cyst from panty line removed as child  . Bone marrow biopsy 12/1966    for anemia    Family History  Problem Relation Age of Onset  . Diabetes Brother   . Diabetes Brother   . Heart failure Mother 16  . Heart attack Father 15    History  Substance Use Topics  . Smoking status: Former Smoker    Types: Cigarettes    Quit date: 12/29/1993  . Smokeless tobacco: Not on file  . Alcohol Use: Not on file    OB History    Grav Para Term Preterm Abortions TAB SAB Ect Mult Living                  Review of Systems  Constitutional: Negative for fever.  Respiratory: Negative for cough and shortness of breath.   Cardiovascular: Negative for chest  pain.  Gastrointestinal: Negative for nausea, abdominal pain and diarrhea.  Skin: Positive for rash (lymphedema of bilateral lower legs). Negative for itching.  Neurological: Negative for headaches.  All other systems reviewed and are negative.    Allergies  Asa; Codeine; Penicillins; and Phosphate  Home Medications   Current Outpatient Rx  Name Route Sig Dispense Refill  . ALISKIREN FUMARATE 300 MG PO TABS Oral Take 300 mg by mouth daily.    Marland Kitchen AMLODIPINE BESYLATE 10 MG PO TABS Oral Take 10 mg by mouth daily.    Marland Kitchen BIOTIN PO Oral Take 1 tablet by mouth daily.    Marland Kitchen CALCIUM + D PO Oral Take 1 tablet by mouth daily.    Marland Kitchen CLINDAMYCIN HCL 300 MG PO CAPS Oral Take 1 capsule (300 mg total) by mouth every 6 (six) hours. 20 capsule 0  . CLOPIDOGREL BISULFATE 75 MG PO TABS Oral Take 75 mg by mouth daily.    . CYCLOBENZAPRINE HCL 10 MG PO TABS Oral Take 10 mg by mouth 3 (three) times daily as needed. For muscle spasms    . FUROSEMIDE 40 MG PO TABS Oral Take 40 mg by mouth daily as needed. For fluid retention    . HYDROCERIN EX CREA Topical Apply 1 application topically daily. Apply topically daily. Wash legs Q day with antibacterial  soap and water. (Dial) Gently scrub with towel after washing to remove loose dead skin, then apply generous layer of Eucerin cream to BLE Q day. 113 g 0  . HYDROCODONE-ACETAMINOPHEN 5-500 MG PO TABS Oral Take 1 tablet by mouth 2 (two) times daily as needed. For pain    . ADULT MULTIVITAMIN W/MINERALS CH Oral Take 1 tablet by mouth daily.    Marland Kitchen OLMESARTAN MEDOXOMIL-HCTZ 40-25 MG PO TABS Oral Take 1 tablet by mouth daily.    Marland Kitchen POTASSIUM CHLORIDE CRYS ER 10 MEQ PO TBCR Oral Take 10 mEq by mouth daily.      BP 125/76  Pulse 86  Temp 98.8 F (37.1 C) (Oral)  Resp 20  SpO2 100%  Physical Exam  Nursing note and vitals reviewed. Constitutional: She is oriented to person, place, and time. She appears well-developed and well-nourished. No distress.  HENT:  Head:  Normocephalic and atraumatic.  Eyes: EOM are normal. Pupils are equal, round, and reactive to light.  Neck: Normal range of motion.  Cardiovascular: Normal rate and normal heart sounds.   Pulmonary/Chest: Effort normal and breath sounds normal. No respiratory distress.  Abdominal: Soft. She exhibits no distension. There is no tenderness.  Musculoskeletal: Normal range of motion.  Neurological: She is alert and oriented to person, place, and time.  Skin: Skin is warm and dry.       ED Course  Procedures (including critical care time)  Labs Reviewed  CBC - Abnormal; Notable for the following:    WBC 11.6 (*)     RBC 3.58 (*)     Hemoglobin 11.0 (*)     HCT 34.0 (*)     All other components within normal limits  BASIC METABOLIC PANEL - Abnormal; Notable for the following:    Glucose, Bld 163 (*)     BUN 24 (*)     Creatinine, Ser 1.17 (*)     GFR calc non Af Amer 44 (*)     GFR calc Af Amer 51 (*)     All other components within normal limits   No results found.   1. Lymphedema   2. Cellulitis   3. Hypertension       MDM  6:52 PM Pt seen and examined. Pt just discharged from hospital yesterday but has been unable to take care of herself. She also notes increased redness in her right lower leg that does not appear to have been mentioned in discharge note. Will get basic labs to see if patient has elevated WBC or any other electrolyte issues.   Pt with mildly elevated WBC count. Will admit to hospital as patient is unable to walk and care for herself at home.         Daleen Bo, MD 10/17/12 Jorje Guild

## 2012-10-16 NOTE — ED Notes (Signed)
Patient is resting comfortably. 

## 2012-10-16 NOTE — ED Notes (Signed)
Patient said she was just discharged yesterday from [facility] with the same problem, cellulitis and lymph edema.  Patient was also seen for diverticulitis and she was discharged yesterday with medication.  Patient has returned with severe pain to both her legs.

## 2012-10-16 NOTE — ED Notes (Signed)
Per EMS, pt d/c from our facility yesterday after admitted for cellulitis, pt returns c/o bilateral leg pain and swelling. Per ems legs are visibly enlarged. VSS BP 152/90, HR 90, rr 20, 96-98 % ra

## 2012-10-16 NOTE — ED Notes (Signed)
Pt reports bilateral leg pain and swelling, she was d/c from our facility yesterday for the same, pt returns w/increase (R) leg swelling and new drainage from (R) leg

## 2012-10-17 ENCOUNTER — Encounter (HOSPITAL_COMMUNITY): Payer: Self-pay | Admitting: *Deleted

## 2012-10-17 MED ORDER — MORPHINE SULFATE 2 MG/ML IJ SOLN
2.0000 mg | INTRAMUSCULAR | Status: DC | PRN
  Administered 2012-10-18: 2 mg via INTRAVENOUS
  Filled 2012-10-17: qty 1

## 2012-10-17 MED ORDER — HYDROCERIN EX CREA
1.0000 "application " | TOPICAL_CREAM | Freq: Every day | CUTANEOUS | Status: DC
  Administered 2012-10-17 – 2012-10-19 (×3): 1 via TOPICAL
  Filled 2012-10-17: qty 113

## 2012-10-17 MED ORDER — IRBESARTAN 300 MG PO TABS
300.0000 mg | ORAL_TABLET | Freq: Every day | ORAL | Status: DC
  Administered 2012-10-17 – 2012-10-19 (×3): 300 mg via ORAL
  Filled 2012-10-17 (×3): qty 1

## 2012-10-17 MED ORDER — CALCIUM CARBONATE-VITAMIN D 500-200 MG-UNIT PO TABS
1.0000 | ORAL_TABLET | Freq: Every day | ORAL | Status: DC
  Administered 2012-10-17 – 2012-10-19 (×3): 1 via ORAL
  Filled 2012-10-17 (×4): qty 1

## 2012-10-17 MED ORDER — ALISKIREN FUMARATE 150 MG PO TABS
300.0000 mg | ORAL_TABLET | Freq: Every day | ORAL | Status: DC
  Administered 2012-10-17 – 2012-10-19 (×3): 300 mg via ORAL
  Filled 2012-10-17 (×3): qty 2

## 2012-10-17 MED ORDER — CLOPIDOGREL BISULFATE 75 MG PO TABS
75.0000 mg | ORAL_TABLET | Freq: Every day | ORAL | Status: DC
  Administered 2012-10-17 – 2012-10-19 (×3): 75 mg via ORAL
  Filled 2012-10-17 (×4): qty 1

## 2012-10-17 MED ORDER — SODIUM CHLORIDE 0.9 % IV SOLN
INTRAVENOUS | Status: DC
  Administered 2012-10-17: 75 mL/h via INTRAVENOUS
  Administered 2012-10-18: 06:00:00 via INTRAVENOUS

## 2012-10-17 MED ORDER — POTASSIUM CHLORIDE CRYS ER 20 MEQ PO TBCR
40.0000 meq | EXTENDED_RELEASE_TABLET | Freq: Once | ORAL | Status: AC
  Administered 2012-10-17: 40 meq via ORAL
  Filled 2012-10-17: qty 2

## 2012-10-17 MED ORDER — ENOXAPARIN SODIUM 40 MG/0.4ML ~~LOC~~ SOLN
40.0000 mg | Freq: Every day | SUBCUTANEOUS | Status: DC
  Administered 2012-10-17 – 2012-10-19 (×3): 40 mg via SUBCUTANEOUS
  Filled 2012-10-17 (×3): qty 0.4

## 2012-10-17 MED ORDER — AMLODIPINE BESYLATE 10 MG PO TABS
10.0000 mg | ORAL_TABLET | Freq: Every day | ORAL | Status: DC
  Administered 2012-10-17 – 2012-10-19 (×3): 10 mg via ORAL
  Filled 2012-10-17 (×3): qty 1

## 2012-10-17 MED ORDER — ADULT MULTIVITAMIN W/MINERALS CH
1.0000 | ORAL_TABLET | Freq: Every day | ORAL | Status: DC
  Administered 2012-10-17 – 2012-10-19 (×3): 1 via ORAL
  Filled 2012-10-17 (×3): qty 1

## 2012-10-17 MED ORDER — SODIUM CHLORIDE 0.9 % IJ SOLN: 3.0000 mL | INTRAMUSCULAR | Status: DC | PRN

## 2012-10-17 MED ORDER — POTASSIUM CHLORIDE CRYS ER 10 MEQ PO TBCR
10.0000 meq | EXTENDED_RELEASE_TABLET | Freq: Every day | ORAL | Status: DC
  Administered 2012-10-17 – 2012-10-19 (×3): 10 meq via ORAL
  Filled 2012-10-17 (×3): qty 1

## 2012-10-17 MED ORDER — FUROSEMIDE 40 MG PO TABS
40.0000 mg | ORAL_TABLET | Freq: Every day | ORAL | Status: DC
  Administered 2012-10-17: 40 mg via ORAL
  Filled 2012-10-17: qty 1

## 2012-10-17 MED ORDER — CLINDAMYCIN HCL 300 MG PO CAPS
300.0000 mg | ORAL_CAPSULE | Freq: Four times a day (QID) | ORAL | Status: DC
  Administered 2012-10-17 – 2012-10-18 (×7): 300 mg via ORAL
  Filled 2012-10-17 (×10): qty 1

## 2012-10-17 MED ORDER — HYDROCHLOROTHIAZIDE 25 MG PO TABS
25.0000 mg | ORAL_TABLET | Freq: Every day | ORAL | Status: DC
  Administered 2012-10-17 – 2012-10-19 (×3): 25 mg via ORAL
  Filled 2012-10-17 (×3): qty 1

## 2012-10-17 MED ORDER — OLMESARTAN MEDOXOMIL-HCTZ 40-25 MG PO TABS: 1.0000 | ORAL_TABLET | Freq: Every day | ORAL | Status: DC

## 2012-10-17 MED ORDER — SODIUM CHLORIDE 0.9 % IV SOLN: 250.0000 mL | INTRAVENOUS | Status: DC | PRN

## 2012-10-17 MED ORDER — CYCLOBENZAPRINE HCL 10 MG PO TABS
10.0000 mg | ORAL_TABLET | Freq: Three times a day (TID) | ORAL | Status: DC | PRN
  Administered 2012-10-17 (×2): 10 mg via ORAL
  Filled 2012-10-17 (×2): qty 1

## 2012-10-17 MED ORDER — HYDROCODONE-ACETAMINOPHEN 5-325 MG PO TABS
1.0000 | ORAL_TABLET | ORAL | Status: DC | PRN
  Administered 2012-10-17 – 2012-10-18 (×4): 1 via ORAL
  Filled 2012-10-17 (×4): qty 1

## 2012-10-17 MED ORDER — SODIUM CHLORIDE 0.9 % IJ SOLN
3.0000 mL | Freq: Two times a day (BID) | INTRAMUSCULAR | Status: DC
  Administered 2012-10-17 – 2012-10-19 (×4): 3 mL via INTRAVENOUS

## 2012-10-17 NOTE — Progress Notes (Signed)
Clinical Social Work: Weekend Coverage:  Received consult for patient for SNF. Patient's chart reviewed and patient recently dc on 10/18 with home health with daughter. Daughter brings patient back to hospital on 10/19 for placement as she will be leaving to go out of town and patient cannot safely take care of herself.  Have called CM with regards to patient most recent stay 10/15-10/18 and if this was considered a qualifying stay. Patient currently is under observation status.  Awaiting report from CM to determine UR. MD Jacky Kindle to also be called regarding status.  Ashley Jacobs, MSW LCSW [phone]

## 2012-10-17 NOTE — H&P (Signed)
[facility] History and Physical  HALAYAH RAPPE NWG:[id number] DOB: 04/17/1933    PCP:   [name], MD   Chief Complaint:  Can't live alone.  HPI: Connie Miranda is an 78 y.o. female  history of HTN, stroke without residual deficit, pernicious anemia, possible G6PD deficiency, and osteoarthritis who was recently discharged from the hospital after treatment for cellulitis.  She has a list of problems outlined by Dr Waymon Amato  below :   Cellulitis of left lower extremity: Erythema extended 2-3 cm above area of chronic skin changes on left lower leg Elevate extremities, LLL in particular. Lot of skin changes are chronic secondary to Lymphedema. Wound care consult appreciated. They have educated patient on care of her legs. Cellulitis has significantly improved. Will complete an additional 5 days of oral Cipro.  Abdominal pain: Unclear etiology. Urinalysis has a Tylerjames Hoglund without WBC, LFTs are wnl, and CT scan unremarkable. Barium swallow shows small hiatal hernia. Lipase normal. Abdominal pain has resolved. Patient indicates that she has an appointment to see Dr. [name] from [facility] in the couple of weeks for colonoscopy. May consider further evaluation of upper GI tract at that time.  Small hiatal hernia on barium swallow: Asymptomatic at this time. Outpatient followup with PCP/GI.  Hip pain due to OA: HAS not complained of significant pain for a couple of days.  Edema of lower extremities is likely due to lymphedema . 2-D echo shows mild LVH and preserved systolic function. Continue lasix and potassium.  CVA, stable: Continue ASA and plavix  HTN, stable. Continue HCTZ, ARB, norvasc and aliskiren. Interestingly patient is on 2 different ARB's -will defer management to PCP. Creatinine stable.  Normocytic anemia, stable. Has history of pernicious anemia, but not taking supplemental b12. stable.Defer to PCP  ?Hx of G6PD deficiency per patient report: -  Wall thickening of GE junction on CT:  Outpatient followup with GI for further followup or evaluation.  Sigmoid diverticulosis on CT.  Her daughter brought her back today because she will be alone and her daughter will be leaving for [place]. She has trouble caring for herself, and being competent, she agreed to be placed into SNF.  Social service has been consulted, and said nothing can be accomplished until Monday.  Hospitalist was asked to admit her for placement.  She is medically stable.                     Rewiew of Systems:  Constitutional: Negative for malaise, fever and chills. No significant weight loss or weight gain Eyes: Negative for eye pain, redness and discharge, diplopia, visual changes, or flashes of light. ENMT: Negative for ear pain, hoarseness, nasal congestion, sinus pressure and sore throat. No headaches; tinnitus, drooling, or problem swallowing. Cardiovascular: Negative for chest pain, palpitations, diaphoresis, dyspnea and peripheral edema. ; No orthopnea, PND Respiratory: Negative for cough, hemoptysis, wheezing and stridor. No pleuritic chestpain. Gastrointestinal: Negative for nausea, vomiting, diarrhea, constipation, abdominal pain, melena, blood in stool, hematemesis, jaundice and rectal bleeding.    Genitourinary: Negative for frequency, dysuria, incontinence,flank pain and hematuria; Musculoskeletal: Negative for back pain and neck pain.   Skin: . Negative for pruritus, rash, abrasions, bruising and skin lesion.; ulcerations Neuro: Negative for headache, lightheadedness and neck stiffness. Negative for weakness, altered level of consciousness , altered mental status, extremity weakness, burning feet, involuntary movement, seizure and syncope.  Psych: negative for anxiety, depression, insomnia, tearfulness, panic attacks, hallucinations, paranoia, suicidal or homicidal ideation  Past Medical History  Diagnosis Date  . Hypertension   . Stroke 09/2000    couldn't move her left foot which  resolved  . Edema   . Pernicious anemia   . Glucose-6-phosphate dehydrogenase deficiency     possible diagnosis of G6PD per patient "phosphate 6 deficiency"  . Osteoarthritis     Past Surgical History  Procedure Date  . Other surgical history     cyst from panty line removed as child  . Bone marrow biopsy 12/1966    for anemia    Medications:  HOME MEDS: Prior to Admission medications   Medication Sig Start Date End Date Taking? Authorizing Provider  aliskiren (TEKTURNA) 300 MG tablet Take 300 mg by mouth daily.   Yes Historical Provider, MD  amLODipine (NORVASC) 10 MG tablet Take 10 mg by mouth daily.   Yes Historical Provider, MD  BIOTIN PO Take 1 tablet by mouth daily.   Yes Historical Provider, MD  Calcium Carbonate-Vitamin D (CALCIUM + D PO) Take 1 tablet by mouth daily.   Yes Historical Provider, MD  clindamycin (CLEOCIN) 300 MG capsule Take 1 capsule (300 mg total) by mouth every 6 (six) hours. 10/15/12  Yes Elease Etienne, MD  clopidogrel (PLAVIX) 75 MG tablet Take 75 mg by mouth daily.   Yes Historical Provider, MD  cyclobenzaprine (FLEXERIL) 10 MG tablet Take 10 mg by mouth 3 (three) times daily as needed. For muscle spasms   Yes Historical Provider, MD  furosemide (LASIX) 40 MG tablet Take 40 mg by mouth daily as needed. For fluid retention   Yes Historical Provider, MD  hydrocerin (EUCERIN) CREA Apply 1 application topically daily. Apply topically daily. Wash legs Q day with antibacterial soap and water. (Dial) Gently scrub with towel after washing to remove loose dead skin, then apply generous layer of Eucerin cream to BLE Q day. 10/15/12  Yes Elease Etienne, MD  HYDROcodone-acetaminophen (VICODIN) 5-500 MG per tablet Take 1 tablet by mouth 2 (two) times daily as needed. For pain   Yes Historical Provider, MD  Multiple Vitamin (MULTIVITAMIN WITH MINERALS) TABS Take 1 tablet by mouth daily.   Yes Historical Provider, MD  olmesartan-hydrochlorothiazide (BENICAR HCT)  40-25 MG per tablet Take 1 tablet by mouth daily.   Yes Historical Provider, MD  potassium chloride (K-DUR,KLOR-CON) 10 MEQ tablet Take 10 mEq by mouth daily.   Yes Historical Provider, MD     Allergies:  Allergies  Allergen Reactions  . Asa (Aspirin) Other (See Comments)    Doctor told her not to take due to type anemia  . Codeine Other (See Comments)    Made her too sleepy  . Penicillins Itching and Swelling  . Phosphate Other (See Comments)    Anything containing phosphate 6. Unknown reaction    Social History:   reports that she quit smoking about 18 years ago. Her smoking use included Cigarettes. She does not have any smokeless tobacco history on file. Her alcohol and drug histories not on file.  Family History: Family History  Problem Relation Age of Onset  . Diabetes Brother   . Diabetes Brother   . Heart failure Mother 35  . Heart attack Father 83     Physical Exam: Filed Vitals:   10/16/12 2230 10/16/12 2245 10/16/12 2300 10/17/12 0036  BP:   142/59 139/56  Pulse: 90 88 90 86  Temp:    98.8 F (37.1 C)  TempSrc:    Oral  Resp: 13 13 16  18  Height:    5\' 4"  (1.626 m)  Weight:    124.9 kg (275 lb 5.7 oz)  SpO2: 100% 100% 100% 100%   Blood pressure 139/56, pulse 86, temperature 98.8 F (37.1 C), temperature source Oral, resp. rate 18, height 5\' 4"  (1.626 m), weight 124.9 kg (275 lb 5.7 oz), SpO2 100.00%.  GEN:  Pleasant  patient lying in the stretcher in no acute distress; cooperative with exam. PSYCH:  alert and oriented x4; does not appear anxious or depressed; affect is appropriate. HEENT: Mucous membranes pink and anicteric; PERRLA; EOM intact; no cervical lymphadenopathy nor thyromegaly or carotid bruit; no JVD; There were no stridor. Neck is very supple. Breasts:: Not examined CHEST WALL: No tenderness CHEST: Normal respiration, clear to auscultation bilaterally.  HEART: Regular rate and rhythm.  There is a heart murmur over her left sternal  border. BACK: No kyphosis or scoliosis; no CVA tenderness ABDOMEN: soft and non-tender; no masses, no organomegaly, normal abdominal bowel sounds; no pannus; no intertriginous candida. There is no rebound and no distention. Rectal Exam: Not done EXTREMITIES:  No evidence of infection.  There is chronic lymphedema on both lower extremities with no evidence of cellulitis. Genitalia: not examined PULSES: 2+ and symmetric SKIN: Normal hydration no rash or ulceration CNS: Cranial nerves 2-12 grossly intact no focal lateralizing neurologic deficit.  Speech is fluent; uvula elevated with phonation, facial symmetry and tongue midline. DTR are normal bilaterally, cerebella exam is intact, barbinski is negative and strengths are equaled bilaterally.  No sensory loss.   Labs on Admission:  Basic Metabolic Panel:  Lab 10/16/12 6295 10/14/12 0610 10/13/12 0630 10/12/12 1905 10/11/12 1235  NA 137 138 135 -- 139  K 3.5 3.7 4.0 -- 4.2  CL 100 101 101 -- 103  CO2 30 29 29  -- 29  GLUCOSE 163* 115* 151* -- 166*  BUN 24* 13 14 -- 17  CREATININE 1.17* 0.88 0.89 0.92 0.93  CALCIUM 9.6 9.2 9.1 -- 9.8  MG -- -- -- -- --  PHOS -- -- -- -- --   Liver Function Tests:  Lab 10/11/12 1235  AST 24  ALT 16  ALKPHOS 85  BILITOT 0.4  PROT 8.1  ALBUMIN 3.7    Lab 10/13/12 0630  LIPASE 13  AMYLASE --   No results found for this basename: AMMONIA:5 in the last 168 hours CBC:  Lab 10/16/12 1935 10/14/12 0610 10/13/12 0630 10/12/12 1905 10/11/12 1235  WBC 11.6* 8.6 9.4 10.0 13.5*  NEUTROABS -- -- -- -- --  HGB 11.0* 9.8* 9.5* 11.4* 10.8*  HCT 34.0* 29.9* 29.1* 35.6* 33.9*  MCV 95.0 94.0 93.9 96.5 94.7  PLT 318 273 254 298 283   Cardiac Enzymes: No results found for this basename: CKTOTAL:5,CKMB:5,CKMBINDEX:5,TROPONINI:5 in the last 168 hours  CBG:  Lab 10/14/12 0646 10/13/12 0645  GLUCAP 131* 158*     Radiological Exams on Admission: No results found.  Assessment/Plan Present on Admission:   .Lymphedema .Cellulitis .Hypertension .Stroke .Osteoarthritis   PLAN:  She has stable medical problems as listed above.  She is in need of SNF placement.  Social service has been consulted.  I have continued all her discharge medication.  Will admit her to general medical floor for SNF.  She is stable.  Other plans as per orders.  Code Status: FULL Unk Lightning, MD. [facility] Pager [phone] 7pm to 7am.  10/17/2012, 5:50 AM

## 2012-10-17 NOTE — Progress Notes (Signed)
   Patient seen earlier today by my colleague Dr. Conley Rolls.  Patient seen and examined, data base reviewed.  His recent hospital stay for left lower x-ray cellulitis and left flank pain.   Came back because she could not manage at home, for SNF placement.  She is dehydrated his BUN/Cr ratio of > 20, IV fluids, check BMP in a.m.  Clint Lipps Pager: [phone] 10/17/2012, 1:55 PM

## 2012-10-17 NOTE — Progress Notes (Signed)
Connie Miranda [id number] Admitted to 5500 10/17/2012 1:08 AM Attending Provider: Houston Siren, MD    Connie Miranda is a 78 y.o. female patient admitted from ED awake, alert  & orientated  X 3,  Full Code, VSS - Blood pressure 139/56, pulse 86, temperature 98.8 F (37.1 C), temperature source Oral, resp. rate 18, height 5\' 4"  (1.626 m), weight 124.9 kg (275 lb 5.7 oz), SpO2 100.00%., O2   RA no shortness of breath, no c/o chest pain, no distress noted.    IV site WDL:  with a transparent dsg that's clean dry and intact.  Allergies:   Allergies  Allergen Reactions  . Asa (Aspirin) Other (See Comments)    Doctor told her not to take due to type anemia  . Codeine Other (See Comments)    Made her too sleepy  . Penicillins Itching and Swelling  . Phosphate Other (See Comments)    Anything containing phosphate 6. Unknown reaction     Past Medical History  Diagnosis Date  . Hypertension   . Stroke 09/2000    couldn't move her left foot which resolved  . Edema   . Pernicious anemia   . Glucose-6-phosphate dehydrogenase deficiency     possible diagnosis of G6PD per patient "phosphate 6 deficiency"  . Osteoarthritis     History:  obtained from patient  Pt orientation to unit, room and routine. Information packet given to patient and safety video watched.  Admission INP armband ID verified with patient, and in place. SR up x 2, fall risk assessment complete with Patient verbalizing understanding of risks associated with falls. Pt verbalizes an understanding of how to use the call bell and to call for help before getting out of bed.  Skin, wound to left posterior calf charted as well as dry lymphedema on BLE.    Will cont to monitor and assist as needed.  Connie Ponder, RN 10/17/2012 1:08 AM

## 2012-10-18 DIAGNOSIS — R109 Unspecified abdominal pain: Secondary | ICD-10-CM

## 2012-10-18 LAB — GLUCOSE, CAPILLARY: Glucose-Capillary: 121 mg/dL — ABNORMAL HIGH (ref 70–99)

## 2012-10-18 LAB — BASIC METABOLIC PANEL
CO2: 30 mEq/L (ref 19–32)
Calcium: 8.9 mg/dL (ref 8.4–10.5)
GFR calc non Af Amer: 51 mL/min — ABNORMAL LOW (ref >90)
Potassium: 4.2 mEq/L (ref 3.5–5.1)
Sodium: 137 mEq/L (ref 135–145)

## 2012-10-18 MED ORDER — SULFAMETHOXAZOLE-TRIMETHOPRIM 400-80 MG PO TABS
1.0000 | ORAL_TABLET | Freq: Two times a day (BID) | ORAL | Status: DC
  Administered 2012-10-18 – 2012-10-19 (×3): 1 via ORAL
  Filled 2012-10-18 (×4): qty 1

## 2012-10-18 NOTE — Progress Notes (Signed)
Addendum  Patient seen and examined, chart and data base reviewed.  I agree with the above assessment and plan.  For full details please see Mrs. Algis Downs PA. Note.  Possible placement to SNF vs ALF.   Clint Lipps, MD [facility] Pager: [phone] 10/18/2012, 4:04 PM

## 2012-10-18 NOTE — Progress Notes (Signed)
Clinical Social Work Department BRIEF PSYCHOSOCIAL ASSESSMENT 10/18/2012  Patient:  Connie Miranda, Connie Miranda     Account Number:  [id number]     Admit date:  10/16/2012  Clinical Social Worker:  Dennison Bulla  Date/Time:  10/18/2012 11:30 AM  Referred by:  Physician  Date Referred:  10/18/2012 Referred for  Psychosocial assessment   Other Referral:   Interview type:  Patient Other interview type:    PSYCHOSOCIAL DATA Living Status:  ALONE Admitted from facility:   Level of care:   Primary support name:  [name] Primary support relationship to patient:  CHILD, ADULT Degree of support available:   Adequate    CURRENT CONCERNS Current Concerns  Post-Acute Placement   Other Concerns:    SOCIAL WORK ASSESSMENT / PLAN CSW received referral to assist with placement. Patient was previously admitted to hospital and dc home with [facility] due to not having a qualifying stay for SNF.  CSW reviewed chart and met with patient at bedside. No visitors were present.    CSW introduced myself and explained role. Patient reports she was informed by MD that she is not safe to live alone and needs placement. CSW explained the Medicare would not cover SNF placement and patient does not feel she can afford to pay for SNF privately. CSW spoke with patient regarding ALF placement. CSW provided patient with ALF list and reported that patient would have to pay privately. Patient interested in [facility]. CSW assisted patient in calling ALF to ask further questions. CSW explained process and patient agreeable that she might need to return home at dc to get affairs in order before admitting to ALF. CSW offered to call family but patient reports that she will contact friends and family to make a decision. Patient asked for CSW to follow up this afternoon to assist with dc plans.   Assessment/plan status:  Psychosocial Support/Ongoing Assessment of Needs Other assessment/ plan:   Information/referral to community  resources:   ALF list    PATIENT'S/FAMILY'S RESPONSE TO PLAN OF CARE: Patient alert and oriented. Patient agreeable to CSW consult and engaged throughout assessment. Patient agreeable to CSW follow up.

## 2012-10-18 NOTE — Progress Notes (Signed)
Clinical Social Work  CSW met with patient and family at bedside to discuss plans. Patient agreeable to family involvement in assessment. Visitor reports she is patient's dtr but patient earlier stated that visitor is her niece. CSW explained ALF option and family agreeable. Patient and family discussed [facility] and patient has called ALF and discussed admission. Patient and family aware that patient cannot stay in hospital if dc plan is only barrier. CSW explained ALF process and answered questions regarding ALF. CSW received permission to fax information to ALF. CSW completed FL2 and submitted for pasarr. CSW faxed information and left a message for admissions coordinator Estell Harpin). CSW will continue to follow.  Per CM, patient does not meet inpatient stay and does not have a qualifying stay. MD aware of plans for dc.  [place], [place] [phone]

## 2012-10-18 NOTE — Progress Notes (Signed)
Patient ID: Connie Miranda, female   DOB: 01/24/1933, 78 y.o.   MRN: [id number] [facility] PROGRESS NOTE  Connie Miranda HYQ:[id number] DOB: May 10, 1933 DOA: 10/16/2012 PCP: [name], MD  Assessment/Plan: Principal Problem:  *Lymphedema Active Problems:  Hypertension  Stroke  Osteoarthritis  Cellulitis    Lymphedema. Chronic.  Making independent living impossible.  Patient's IVF was discontinued this am.  RN wound care to legs. Recent cellulitis of left lower extremity still on antibiotic therapy.  Will change to Bactrim PO.  Elevate extremities, LLL in particular. Lot of skin changes are chronic secondary to Lymphedema. They have educated patient on care of her legs.   Acute Renal Failure.  Likely due to dehydration.  Appears to have resolved with IVF.  Subacute left sided abdominal pain: Unclear etiology. Work up from previous hospitalization included: Urinalysis has a LE without WBC, LFTs are wnl, and CT scan unremarkable. Barium swallow shows small hiatal hernia. Lipase normal. Patient indicates that she has an appointment to see Dr. [name] from [facility] in the couple of weeks for colonoscopy. May consider further evaluation of upper GI tract at that time.  Asymptomatic at this time. Outpatient followup with PCP/GI.  Hip pain due to OA.  Currently stable.   CVA, stable: Continue ASA and plavix  HTN, stable. Continue HCTZ, ARB, norvasc and aliskiren. Creatinine stable.   Normocytic anemia, stable. Has history of pernicious anemia, but not taking supplemental b12. stable.Defer to PCP   ?Hx of G6PD deficiency per patient report: -   Wall thickening of GE junction on CT: Outpatient followup with GI for further followup or evaluation. Sigmoid diverticulosis on CT.   Code Status: full code Family Communication: With patient at bedside.  Patient is A&O Disposition Plan: for placement.  Brief narrative: Connie Miranda is an 78 y.o. female history of HTN, stroke  without residual deficit, pernicious anemia, possible G6PD deficiency, and osteoarthritis who was recently discharged from the hospital after treatment for cellulitis.  She has trouble caring for herself, and being competent, she agreed to be placed into SNF. Social service has been consulted, and said nothing can be accomplished until Monday. Hospitalist was asked to admit her for placement. She is medically stable.   Antibiotics:  Bactrim  HPI/Subjective: Left sided flank pain.  Relieved by morphine.  Objective: Filed Vitals:   10/17/12 1025 10/17/12 1445 10/18/12 0658 10/18/12 0954  BP: 141/44 121/36 144/70 123/69  Pulse:  78 79 75  Temp:  98.7 F (37.1 C) 98.7 F (37.1 C) 98.3 F (36.8 C)  TempSrc:  Oral Oral Oral  Resp:  20 18 18   Height:      Weight:      SpO2:  94% 92% 92%    Intake/Output Summary (Last 24 hours) at 10/18/12 1221 Last data filed at 10/18/12 1114  Gross per 24 hour  Intake 2281.25 ml  Output      0 ml  Net 2281.25 ml    Exam:   General:  A&O, NAD  Cardiovascular: RRR, No M/R/G, no Lower Extremity Edema  Respiratory: CTA, No increased work of breathing  Abdomen: Soft, obese, NT, ND, positive bowel sounds, no obvious masses  Neurological:  Cranial Nerves 2 - 12 are grossly intact, exam non focal  Psychiatric:  A&O, Pleasant, Cooperative, well groomed.  Skin: Extensive changes of lymphedema to lower extremities bilaterally.  Dark discoloration, swelling, and thickening of skin of distal lower extremities.  Data Reviewed: Basic Metabolic Panel:  Lab 10/18/12 9528  10/16/12 1935 10/14/12 0610 10/13/12 0630 10/12/12 1905 10/11/12 1235  NA 137 137 138 135 -- 139  K 4.2 3.5 3.7 4.0 -- 4.2  CL 103 100 101 101 -- 103  CO2 30 30 29 29  -- 29  GLUCOSE 106* 163* 115* 151* -- 166*  BUN 20 24* 13 14 -- 17  CREATININE 1.03 1.17* 0.88 0.89 0.92 --  CALCIUM 8.9 9.6 9.2 9.1 -- 9.8  MG -- -- -- -- -- --  PHOS -- -- -- -- -- --   Liver Function  Tests:  Lab 10/11/12 1235  AST 24  ALT 16  ALKPHOS 85  BILITOT 0.4  PROT 8.1  ALBUMIN 3.7    Lab 10/13/12 0630  LIPASE 13  AMYLASE --   CBC:  Lab 10/16/12 1935 10/14/12 0610 10/13/12 0630 10/12/12 1905 10/11/12 1235  WBC 11.6* 8.6 9.4 10.0 13.5*  NEUTROABS -- -- -- -- --  HGB 11.0* 9.8* 9.5* 11.4* 10.8*  HCT 34.0* 29.9* 29.1* 35.6* 33.9*  MCV 95.0 94.0 93.9 96.5 94.7  PLT 318 273 254 298 283   CBG:  Lab 10/14/12 0646 10/13/12 0645  GLUCAP 131* 158*      Studies: Dg Hip Complete Left  10/11/2012  *RADIOLOGY REPORT*  Clinical Data: Pain in the left hip for 2 days.  No known injury. Unable to move or rotate legs.  LEFT HIP - COMPLETE 2+ VIEW  Comparison: None.  Findings: AP and lateral views of the left hip include an AP view of the pelvis.  There is contrast within the small bowel loops in anticipation of CT exam ordered later today.  Degenerative changes are seen in the hip.  There is no evidence for acute fracture or subluxation.  Of overlying the central pelvis, there is an intrauterine device.  IMPRESSION:  1.  No evidence for acute abnormality of the left hip. 2.  Degenerative changes. 3.  Intrauterine device overlies the central pelvis. 3.  Contrast within small bowel, obscuring detail in the lower abdomen pelvis.   Original Report Authenticated By: Patterson Hammersmith, M.D.    Ct Abdomen Pelvis W Contrast  10/11/2012  *RADIOLOGY REPORT*  Clinical Data: Left lower quadrant pain for 3 days, left hip pain for 1 day, chronic bilateral pitting edema  CT ABDOMEN AND PELVIS WITH CONTRAST  Technique:  Multidetector CT imaging of the abdomen and pelvis was performed following the standard protocol during bolus administration of intravenous contrast.  Contrast: 80mL OMNIPAQUE IOHEXOL 300 MG/ML  SOLN Dilute oral contrast.  Comparison: None  Findings: Minimal bibasilar atelectasis. Calcified granulomata within liver and spleen. Probable small cyst left kidney 11 x 8 mm image 35.  Liver, spleen, pancreas, kidneys, and adrenal glands otherwise normal appearance. Normal appendix. Small hiatal hernia with wall thickening at the gastroesophageal junction, question mass versus inflammatory process or artifact from underdistension. IUD within uterus. Bladder and adnexae unremarkable.  Few sigmoid diverticula are identified without definite evidence of diverticulitis. Numerous normal inguinal lymph nodes bilaterally. Minimal atherosclerotic calcification aorta. No mass, adenopathy, free fluid or inflammatory process. No hernia or acute bony lesion. Multilevel degenerative disc and facet disease changes of the lumbar spine with multifactorial spinal stenosis at L3-L4, L4-L5, and L5-S1.  IMPRESSION: Old granulomatous disease. Minimal sigmoid diverticulosis without evidence of diverticulitis. Wall thickening at gastroesophageal junction question inflammatory process, tumor, or underdistension artifact; recommend EGD or barium esophagram to assess. No definite cause for left hip/left lower quadrant abdominal pain identified. Degenerative disc disease changes lumbar spine  with multilevel spinal stenosis.   Original Report Authenticated By: Lollie Marrow, M.D.    Dg Esophagus  10/13/2012  *RADIOLOGY REPORT*  Clinical Data: thickening of the GE junction on CT.  ESOPHOGRAM/BARIUM SWALLOW  Technique:  Single contrast examination was performed using .  Fluoroscopy time:  1 minute.  Comparison:  10/11/2012  Findings:  There are no fixed esophageal strictures, fold thickening or mass.  There is a small hiatal hernia noted.  No focal abnormality.  The area of fold thickening seen on prior CT cannot be appreciated on esophagram.  Occasional tertiary contractions seen within the esophagus.  IMPRESSION: Small hiatal hernia.  Cannot visualize or appreciate the fold thickening seen on CT.   Original Report Authenticated By: Cyndie Chime, M.D.    Dg Chest Port 1 View  10/12/2012  *RADIOLOGY REPORT*  Clinical  Data: Shortness of breath.  PORTABLE CHEST - 1 VIEW  Comparison: None.  Findings: Severe right greater than left glenohumeral joint osteoarthritis. Cardiomegaly accentuated by AP portable technique. No pleural effusion or pneumothorax.  Low lung volumes.  This accentuates the pulmonary interstitium.  Suspicion of mild concurrent peribronchial thickening. No lobar consolidation.  IMPRESSION:  1.  Cardiomegaly and mildly low lung volumes. 2.  Suspicion of concurrent interstitial thickening, as can be seen with chronic bronchitis/smoking. No lobar consolidation.   Original Report Authenticated By: Consuello Bossier, M.D.     Scheduled Meds:   . aliskiren  300 mg Oral Daily  . amLODipine  10 mg Oral Daily  . calcium-vitamin D  1 tablet Oral Q breakfast  . clindamycin  300 mg Oral Q6H  . clopidogrel  75 mg Oral Q breakfast  . enoxaparin (LOVENOX) injection  40 mg Subcutaneous Daily  . hydrocerin  1 application Topical Daily  . irbesartan  300 mg Oral Daily   And  . hydrochlorothiazide  25 mg Oral Daily  . multivitamin with minerals  1 tablet Oral Daily  . potassium chloride  10 mEq Oral Daily  . potassium chloride  40 mEq Oral Once  . sodium chloride  3 mL Intravenous Q12H  . DISCONTD: furosemide  40 mg Oral Daily   Continuous Infusions:   . DISCONTD: sodium chloride 75 mL/hr at 10/18/12 1610     Stephani Police [facility] Pager [phone]  If 7PM-7AM, please contact night-coverage [email or web address] Password [email or web address] 10/18/2012, 12:21 PM   LOS: 2 days

## 2012-10-18 NOTE — ED Provider Notes (Signed)
Medical screening examination/treatment/procedure(s) were performed by non-physician practitioner and as supervising physician I was immediately available for consultation/collaboration.   Suzi Roots, MD 10/18/12 [phone]

## 2012-10-18 NOTE — Progress Notes (Signed)
Clinical Social Work  CSW met with ALF and patient in room. ALF agreeable to admission tomorrow with [facility] through [place]. CSW informed CM and MD of plans. CSW will continue to follow.  [place], [place] [phone]

## 2012-10-18 NOTE — Care Management Note (Addendum)
    Page 1 of 2   10/19/2012     2:09:56 PM   CARE MANAGEMENT NOTE 10/19/2012  Patient:  Connie Miranda, Connie Miranda   Account Number:  [id number]  Date Initiated:  10/18/2012  Documentation initiated by:  Letha Cape  Subjective/Objective Assessment:   dx lymphedema  admit- lives alone.  Patient is active with [facility] for [facility], pt, ot , aide and Child psychotherapist.     Action/Plan:   pt eval - rec snf.   Anticipated DC Date:  10/19/2012   Anticipated DC Plan:  ASSISTED LIVING / REST HOME  In-house referral  Clinical Social Worker      DC Associate Professor  CM consult      [facility] Choice  HOME HEALTH   Choice offered to / List presented to:  C-1 Patient   DME arranged  3-N-1  WALKER - Lavone Nian      DME agency  [facility]     HH arranged  HH-1 RN  HH-2 PT  HH-3 OT  HH-4 NURSE'S AIDE  HH-6 SOCIAL WORKER      HH agency  [facility]   Status of service:  Completed, signed off Medicare Important Message given?   (If response is "NO", the following Medicare IM given date fields will be blank) Date Medicare IM given:   Date Additional Medicare IM given:    Discharge Disposition:  ASSISTED LIVING  Per UR Regulation:  Reviewed for med. necessity/level of care/duration of stay  If discussed at Long Length of Stay Meetings, dates discussed:    Comments:  10/19/12 13:50 Letha Cape RN, BSN [phone] pt for dc today to [facility], patient states she will need rolling walker and bsc.  Referral made to Elizebeth Koller notified and the [facility] rep will bring dme to [facility]. [facility].  10/18/12 10:48 Letha Cape RN,  BSN  [phone] Patient lives alone, pt is active with [facility] for [facility], pt, ot , aide and CSW.  Family wants patient to go to snf, but patient does not have a qualifying 3 day inpt stay for snf. Reveiw was done and determined to be observation . Awaiting call back from Medical Director.  Also aske [facility] to see if patient would be a candidate for [facility].  [facility]  states she is not a candidate, pt is for [employer] tomorrow to [place]. [facility] with [facility] services for  rn,pt,ot and aide.  Referral made to Elizebeth Koller notified.  Soc will begin 24-48 hrs post discharge.

## 2012-10-19 DIAGNOSIS — M25559 Pain in unspecified hip: Secondary | ICD-10-CM

## 2012-10-19 MED ORDER — HYDROCODONE-ACETAMINOPHEN 5-500 MG PO TABS: 1.0000 | ORAL_TABLET | Freq: Two times a day (BID) | ORAL | Status: DC | PRN

## 2012-10-19 MED ORDER — SULFAMETHOXAZOLE-TRIMETHOPRIM 400-80 MG PO TABS: 1.0000 | ORAL_TABLET | Freq: Two times a day (BID) | ORAL | Status: DC

## 2012-10-19 NOTE — Progress Notes (Signed)
Nsg Discharge Note  Admit Date:  10/16/2012 Discharge date: 10/19/2012   Belita Warsame Maisano to be D/C'd to ALF [facility] Agent per MD order.  AVS completed.  Copy for chart, and copy for patient signed, and dated. Patient/caregiver able to verbalize understanding.  Discharge Medication:  Lisha, Vitale  Home Medication Instructions ZOX:[id number]   Printed on:10/19/12 2019  Medication Information                    olmesartan-hydrochlorothiazide (BENICAR HCT) 40-25 MG per tablet Take 1 tablet by mouth daily.           clopidogrel (PLAVIX) 75 MG tablet Take 75 mg by mouth daily.           amLODipine (NORVASC) 10 MG tablet Take 10 mg by mouth daily.           aliskiren (TEKTURNA) 300 MG tablet Take 300 mg by mouth daily.           potassium chloride (K-DUR,KLOR-CON) 10 MEQ tablet Take 10 mEq by mouth daily.           furosemide (LASIX) 40 MG tablet Take 40 mg by mouth daily as needed. For fluid retention           BIOTIN PO Take 1 tablet by mouth daily.           Multiple Vitamin (MULTIVITAMIN WITH MINERALS) TABS Take 1 tablet by mouth daily.           Calcium Carbonate-Vitamin D (CALCIUM + D PO) Take 1 tablet by mouth daily.           hydrocerin (EUCERIN) CREA Apply 1 application topically daily. Apply topically daily. Wash legs Q day with antibacterial soap and water. (Dial) Gently scrub with towel after washing to remove loose dead skin, then apply generous layer of Eucerin cream to BLE Q day.           cyclobenzaprine (FLEXERIL) 10 MG tablet Take 10 mg by mouth 3 (three) times daily as needed. For muscle spasms           HYDROcodone-acetaminophen (VICODIN) 5-500 MG per tablet Take 1 tablet by mouth 2 (two) times daily as needed. For pain           sulfamethoxazole-trimethoprim (BACTRIM,SEPTRA) 400-80 MG per tablet Take 1 tablet by mouth every 12 (twelve) hours.             Discharge Assessment: Filed Vitals:   10/19/12 0500  BP: 145/66  Pulse: 69  Temp: 98.7 F  (37.1 C)  Resp: 18  Skin has dry cracked and thick areas on BLE. Has stage II on Calf. IV catheter discontinued intact. Site without signs and symptoms of complications - no redness or edema noted at insertion site, patient denies c/o pain - only slight tenderness at site.  Dressing with slight pressure applied.  D/c Instructions-Education: Discharge instructions given to patient/family with verbalized understanding. D/c education completed with patient/family including follow up instructions, medication list, d/c activities limitations if indicated, with other d/c instructions as indicated by MD - patient able to verbalize understanding, all questions fully answered. Patient instructed to return to ED, call 911, or call MD for any changes in condition.  Patient escorted via WC, and D/C home via private auto.  Kaniyah Lisby Consuella Lose, RN 10/19/2012 8:19 PM

## 2012-10-19 NOTE — Discharge Summary (Signed)
Physician Discharge Summary  Connie Miranda UJW:[id number] DOB: [birth year] DOA: 10/16/2012  PCP: [name], MD  Admit date: 10/16/2012 Discharge date: 10/19/2012  Recommendations for Outpatient Follow-up:   1. See primary care physician for CBC, BMET and BP check in 1 week.  Patient has been in the hospital with Acute Renal Failure  Secondary to dehydration.  Monitor lasix and HCTZ dosage.  2.  Wound care.  Severe Chronic Lymphedema in bilateral lower extremities.  3.  Follow up with Gastroenterology for previously scheduled Colonoscopy and abdominal pain work up.   Discharge Diagnoses:  Principal Problem:  *Lymphedema Active Problems:  Hypertension  Stroke  Osteoarthritis  Cellulitis  History of present illness: Connie Miranda is an 78 y.o. female history of HTN, stroke without residual deficit, pernicious anemia, possible G6PD deficiency, and osteoarthritis who was recently discharged from the hospital after treatment for cellulitis. She has trouble caring for herself, and being competent, she agreed to be placed into SNF. Social service has been consulted, and said nothing can be accomplished until Monday. Hospitalist was asked to admit her for placement. She is medically stable.  Lymphedema. Chronic, severe. Making independent living impossible. RN wound care to legs.  Recent cellulitis of left lower extremity still on antibiotic therapy (Bactrim) for three more days.  Elevate extremities, LLL in particular. Lot of skin changes are chronic secondary to Lymphedema. They have educated patient on care of her legs.   Acute Renal Failure. Likely due to dehydration. Appears to have resolved with IVF.  It will be important going forward to monitor her diuretics and balance them against her creatinine.  Subacute left sided abdominal pain: Unclear etiology. Work up from previous hospitalization (10/16) included: Urinalysis has a LE without WBC, LFTs are wnl, and CT scan  unremarkable. Barium swallow shows small hiatal hernia. Lipase normal. Patient indicates that she has an appointment to see Dr. [name] from [facility] in the couple of weeks for colonoscopy. May consider further evaluation of upper GI tract at that time. Asymptomatic at this time. Outpatient followup with PCP/GI.   Hip pain due to OA. Currently stable.   CVA, stable: Continue ASA and plavix   HTN, stable. Continue HCTZ, ARB, norvasc and aliskiren. Creatinine stable.   Normocytic anemia, stable. Has history of pernicious anemia, but not taking supplemental b12. stable.Defer to PCP   ?Hx of G6PD deficiency, per patient report. Stable.  Wall thickening of GE junction on CT: Outpatient followup with GI for further followup or evaluation. Sigmoid diverticulosis on CT.   Discharge Condition: Stable.  Diet recommendation: low sodium, heart healthy    Discharge Exam: Filed Vitals:   10/19/12 0500  BP: 145/66  Pulse: 69  Temp: 98.7 F (37.1 C)  Resp: 18   Filed Vitals:   10/18/12 0954 10/18/12 1426 10/18/12 2143 10/19/12 0500  BP: 123/69 154/73 132/42 145/66  Pulse: 75 76 76 69  Temp: 98.3 F (36.8 C) 98.7 F (37.1 C) 99.3 F (37.4 C) 98.7 F (37.1 C)  TempSrc: Oral Oral Oral Oral  Resp: 18 18 16 18   Height:      Weight:      SpO2: 92% 96% 99% 97%   General: A&O, NAD, comfortable in bed. Cardiovascular: RRR, no M/R/G Respiratory: CTA, no W/C/R Abdomen:  Obese, Soft, NT, ND, +BS Lower Extremities:  Approximate 12" long stocking like area of severely darkened, thick skin with swelling in bilateral lower extremities extending into the dorsum of her feet.  No erythema or drainage  at present    Discharge Instructions  Discharge Orders    Future Orders Please Complete By Expires   Diet - low sodium heart healthy      Increase activity slowly          Medication List     As of 10/19/2012  9:40 AM    STOP taking these medications         clindamycin 300 MG capsule    Commonly known as: CLEOCIN      TAKE these medications         aliskiren 300 MG tablet   Commonly known as: TEKTURNA   Take 300 mg by mouth daily.      amLODipine 10 MG tablet   Commonly known as: NORVASC   Take 10 mg by mouth daily.      BIOTIN PO   Take 1 tablet by mouth daily.      CALCIUM + D PO   Take 1 tablet by mouth daily.      clopidogrel 75 MG tablet   Commonly known as: PLAVIX   Take 75 mg by mouth daily.      cyclobenzaprine 10 MG tablet   Commonly known as: FLEXERIL   Take 10 mg by mouth 3 (three) times daily as needed. For muscle spasms      furosemide 40 MG tablet   Commonly known as: LASIX   Take 40 mg by mouth daily as needed. For fluid retention      hydrocerin Crea   Apply 1 application topically daily. Apply topically daily. Wash legs Q day with antibacterial soap and water. (Dial) Gently scrub with towel after washing to remove loose dead skin, then apply generous layer of Eucerin cream to BLE Q day.      HYDROcodone-acetaminophen 5-500 MG per tablet   Commonly known as: VICODIN   Take 1 tablet by mouth 2 (two) times daily as needed. For pain      multivitamin with minerals Tabs   Take 1 tablet by mouth daily.      olmesartan-hydrochlorothiazide 40-25 MG per tablet   Commonly known as: BENICAR HCT   Take 1 tablet by mouth daily.      potassium chloride 10 MEQ tablet   Commonly known as: K-DUR,KLOR-CON   Take 10 mEq by mouth daily.      sulfamethoxazole-trimethoprim 400-80 MG per tablet   Commonly known as: BACTRIM,SEPTRA   Take 1 tablet by mouth every 12 (twelve) hours.          The results of significant diagnostics from this hospitalization (including imaging, microbiology, ancillary and laboratory) are listed below for reference.    Significant Diagnostic Studies: Dg Hip Complete Left  10/11/2012  *RADIOLOGY REPORT*  Clinical Data: Pain in the left hip for 2 days.  No known injury. Unable to move or rotate legs.  LEFT HIP -  COMPLETE 2+ VIEW  Comparison: None.  Findings: AP and lateral views of the left hip include an AP view of the pelvis.  There is contrast within the small bowel loops in anticipation of CT exam ordered later today.  Degenerative changes are seen in the hip.  There is no evidence for acute fracture or subluxation.  Of overlying the central pelvis, there is an intrauterine device.  IMPRESSION:  1.  No evidence for acute abnormality of the left hip. 2.  Degenerative changes. 3.  Intrauterine device overlies the central pelvis. 3.  Contrast within small bowel, obscuring detail in the  lower abdomen pelvis.   Original Report Authenticated By: Patterson Hammersmith, M.D.    Ct Abdomen Pelvis W Contrast  10/11/2012  *RADIOLOGY REPORT*  Clinical Data: Left lower quadrant pain for 3 days, left hip pain for 1 day, chronic bilateral pitting edema  CT ABDOMEN AND PELVIS WITH CONTRAST  Technique:  Multidetector CT imaging of the abdomen and pelvis was performed following the standard protocol during bolus administration of intravenous contrast.  Contrast: 80mL OMNIPAQUE IOHEXOL 300 MG/ML  SOLN Dilute oral contrast.  Comparison: None  Findings: Minimal bibasilar atelectasis. Calcified granulomata within liver and spleen. Probable small cyst left kidney 11 x 8 mm image 35. Liver, spleen, pancreas, kidneys, and adrenal glands otherwise normal appearance. Normal appendix. Small hiatal hernia with wall thickening at the gastroesophageal junction, question mass versus inflammatory process or artifact from underdistension. IUD within uterus. Bladder and adnexae unremarkable.  Few sigmoid diverticula are identified without definite evidence of diverticulitis. Numerous normal inguinal lymph nodes bilaterally. Minimal atherosclerotic calcification aorta. No mass, adenopathy, free fluid or inflammatory process. No hernia or acute bony lesion. Multilevel degenerative disc and facet disease changes of the lumbar spine with multifactorial  spinal stenosis at L3-L4, L4-L5, and L5-S1.  IMPRESSION: Old granulomatous disease. Minimal sigmoid diverticulosis without evidence of diverticulitis. Wall thickening at gastroesophageal junction question inflammatory process, tumor, or underdistension artifact; recommend EGD or barium esophagram to assess. No definite cause for left hip/left lower quadrant abdominal pain identified. Degenerative disc disease changes lumbar spine with multilevel spinal stenosis.   Original Report Authenticated By: Lollie Marrow, M.D.    Dg Esophagus  10/13/2012  *RADIOLOGY REPORT*  Clinical Data: thickening of the GE junction on CT.  ESOPHOGRAM/BARIUM SWALLOW  Technique:  Single contrast examination was performed using .  Fluoroscopy time:  1 minute.  Comparison:  10/11/2012  Findings:  There are no fixed esophageal strictures, fold thickening or mass.  There is a small hiatal hernia noted.  No focal abnormality.  The area of fold thickening seen on prior CT cannot be appreciated on esophagram.  Occasional tertiary contractions seen within the esophagus.  IMPRESSION: Small hiatal hernia.  Cannot visualize or appreciate the fold thickening seen on CT.   Original Report Authenticated By: Cyndie Chime, M.D.    Dg Chest Port 1 View  10/12/2012  *RADIOLOGY REPORT*  Clinical Data: Shortness of breath.  PORTABLE CHEST - 1 VIEW  Comparison: None.  Findings: Severe right greater than left glenohumeral joint osteoarthritis. Cardiomegaly accentuated by AP portable technique. No pleural effusion or pneumothorax.  Low lung volumes.  This accentuates the pulmonary interstitium.  Suspicion of mild concurrent peribronchial thickening. No lobar consolidation.  IMPRESSION:  1.  Cardiomegaly and mildly low lung volumes. 2.  Suspicion of concurrent interstitial thickening, as can be seen with chronic bronchitis/smoking. No lobar consolidation.   Original Report Authenticated By: Consuello Bossier, M.D.    Labs: Basic Metabolic Panel:  Lab  10/18/12 0610 10/16/12 1935 10/14/12 0610 10/13/12 0630 10/12/12 1905  NA 137 137 138 135 --  K 4.2 3.5 3.7 4.0 --  CL 103 100 101 101 --  CO2 30 30 29 29  --  GLUCOSE 106* 163* 115* 151* --  BUN 20 24* 13 14 --  CREATININE 1.03 1.17* 0.88 0.89 0.92  CALCIUM 8.9 9.6 9.2 9.1 --  MG -- -- -- -- --  PHOS -- -- -- -- --    Lab 10/13/12 0630  LIPASE 13  AMYLASE --   CBC:  Lab  10/16/12 1935 10/14/12 0610 10/13/12 0630 10/12/12 1905  WBC 11.6* 8.6 9.4 10.0  NEUTROABS -- -- -- --  HGB 11.0* 9.8* 9.5* 11.4*  HCT 34.0* 29.9* 29.1* 35.6*  MCV 95.0 94.0 93.9 96.5  PLT 318 273 254 298   CBG:  Lab 10/15/12 0632 10/14/12 0646 10/13/12 0645  GLUCAP 121* 131* 158*    Time coordinating discharge: 30 min  Signed:  Algis Downs, PA-C [facility] Pager: [phone]   10/19/2012, 9:40 AM

## 2012-10-19 NOTE — Discharge Summary (Signed)
Addendum  Patient seen and examined, chart and data base reviewed.  I agree with the above assessment and discharge plan.  For full details please see Mrs. Algis Downs PA. Note.  Declined functional status, to ALF   Clint Lipps, MD [facility] Pager: [phone] 10/19/2012, 1:40 PM

## 2012-10-19 NOTE — Progress Notes (Signed)
Clinical Social Work Department CLINICAL SOCIAL WORK PLACEMENT NOTE 10/19/2012  Patient:  Connie Miranda, Connie Miranda  Account Number:  [id number] Admit date:  10/16/2012  Clinical Social Worker:  Unk Lightning, LCSW  Date/time:  10/18/2012 12:30 PM  Clinical Social Work is seeking post-discharge placement for this patient at the following level of care:   ASSISTED LIVING/REST HOME   (*CSW will update this form in Epic as items are completed)   10/18/2012  Patient/family provided with [facility] Department of Clinical Social Work's list of facilities offering this level of care within the geographic area requested by the patient (or if unable, by the patient's family).  10/18/2012  Patient/family informed of their freedom to choose among providers that offer the needed level of care, that participate in Medicare, Medicaid or managed care program needed by the patient, have an available bed and are willing to accept the patient.  10/18/2012  Patient/family informed of [facility]' ownership interest in [facility], as well as of the fact that they are under no obligation to receive care at this facility.  PASARR submitted to EDS on 10/18/2012 PASARR number received from EDS on 10/18/2012  FL2 transmitted to all facilities in geographic area requested by pt/family on  10/18/2012 FL2 transmitted to all facilities within larger geographic area on   Patient informed that his/her managed care company has contracts with or will negotiate with  certain facilities, including the following:     Patient/family informed of bed offers received:  10/18/2012 Patient chooses bed at [facility]. [facility] Physician recommends and patient chooses bed at    Patient to [facility] transferred to [facility] on  10/19/2012 Patient to be transferred to facility by [facility]  The following physician request were entered in Epic:   Additional Comments:

## 2012-10-19 NOTE — Progress Notes (Signed)
Clinical Social Work  CSW faxed [employer] and scripts to [facility]. ALF ready for patient to admit. CSW informed patient, dtr and RN of dc and all parties agreeable. CSW coordinated transportation via [place]. CSW is signing off.  [place], [place] [phone]

## 2013-01-28 ENCOUNTER — Inpatient Hospital Stay (HOSPITAL_COMMUNITY): Payer: Medicare Other

## 2013-01-28 ENCOUNTER — Emergency Department (HOSPITAL_COMMUNITY): Payer: Medicare Other

## 2013-01-28 ENCOUNTER — Inpatient Hospital Stay (HOSPITAL_COMMUNITY)
Admission: EM | Admit: 2013-01-28 | Discharge: 2013-02-01 | DRG: 683 | Disposition: A | Discharge status: Skilled Nursing Facility | Payer: Medicare Other | Attending: Internal Medicine | Admitting: Internal Medicine

## 2013-01-28 ENCOUNTER — Encounter (HOSPITAL_COMMUNITY): Payer: Self-pay | Admitting: Emergency Medicine

## 2013-01-28 DIAGNOSIS — L03116 Cellulitis of left lower limb: Secondary | ICD-10-CM

## 2013-01-28 DIAGNOSIS — Z79899 Other long term (current) drug therapy: Secondary | ICD-10-CM

## 2013-01-28 DIAGNOSIS — L03119 Cellulitis of unspecified part of limb: Secondary | ICD-10-CM

## 2013-01-28 DIAGNOSIS — I639 Cerebral infarction, unspecified: Secondary | ICD-10-CM | POA: Diagnosis present

## 2013-01-28 DIAGNOSIS — Z886 Allergy status to analgesic agent status: Secondary | ICD-10-CM

## 2013-01-28 DIAGNOSIS — R110 Nausea: Secondary | ICD-10-CM | POA: Diagnosis present

## 2013-01-28 DIAGNOSIS — T46905A Adverse effect of unspecified agents primarily affecting the cardiovascular system, initial encounter: Secondary | ICD-10-CM | POA: Diagnosis present

## 2013-01-28 DIAGNOSIS — R197 Diarrhea, unspecified: Secondary | ICD-10-CM

## 2013-01-28 DIAGNOSIS — L0390 Cellulitis, unspecified: Secondary | ICD-10-CM | POA: Diagnosis present

## 2013-01-28 DIAGNOSIS — M1990 Unspecified osteoarthritis, unspecified site: Secondary | ICD-10-CM

## 2013-01-28 DIAGNOSIS — D75A Glucose-6-phosphate dehydrogenase (G6PD) deficiency without anemia: Secondary | ICD-10-CM

## 2013-01-28 DIAGNOSIS — Z888 Allergy status to other drugs, medicaments and biological substances status: Secondary | ICD-10-CM

## 2013-01-28 DIAGNOSIS — I872 Venous insufficiency (chronic) (peripheral): Secondary | ICD-10-CM | POA: Diagnosis present

## 2013-01-28 DIAGNOSIS — I10 Essential (primary) hypertension: Secondary | ICD-10-CM | POA: Diagnosis present

## 2013-01-28 DIAGNOSIS — M25559 Pain in unspecified hip: Secondary | ICD-10-CM

## 2013-01-28 DIAGNOSIS — R112 Nausea with vomiting, unspecified: Secondary | ICD-10-CM | POA: Insufficient documentation

## 2013-01-28 DIAGNOSIS — D510 Vitamin B12 deficiency anemia due to intrinsic factor deficiency: Secondary | ICD-10-CM

## 2013-01-28 DIAGNOSIS — I890 Lymphedema, not elsewhere classified: Secondary | ICD-10-CM | POA: Diagnosis present

## 2013-01-28 DIAGNOSIS — Z6841 Body Mass Index (BMI) 40.0 and over, adult: Secondary | ICD-10-CM

## 2013-01-28 DIAGNOSIS — Z9181 History of falling: Secondary | ICD-10-CM

## 2013-01-28 DIAGNOSIS — E875 Hyperkalemia: Secondary | ICD-10-CM | POA: Diagnosis present

## 2013-01-28 DIAGNOSIS — L97209 Non-pressure chronic ulcer of unspecified calf with unspecified severity: Secondary | ICD-10-CM | POA: Diagnosis present

## 2013-01-28 DIAGNOSIS — R609 Edema, unspecified: Secondary | ICD-10-CM

## 2013-01-28 DIAGNOSIS — N19 Unspecified kidney failure: Secondary | ICD-10-CM

## 2013-01-28 DIAGNOSIS — Z880 Allergy status to penicillin: Secondary | ICD-10-CM

## 2013-01-28 DIAGNOSIS — W19XXXA Unspecified fall, initial encounter: Secondary | ICD-10-CM | POA: Diagnosis present

## 2013-01-28 DIAGNOSIS — Z8673 Personal history of transient ischemic attack (TIA), and cerebral infarction without residual deficits: Secondary | ICD-10-CM

## 2013-01-28 DIAGNOSIS — T465X5A Adverse effect of other antihypertensive drugs, initial encounter: Secondary | ICD-10-CM | POA: Diagnosis present

## 2013-01-28 DIAGNOSIS — D649 Anemia, unspecified: Secondary | ICD-10-CM

## 2013-01-28 DIAGNOSIS — N179 Acute kidney failure, unspecified: Principal | ICD-10-CM | POA: Diagnosis present

## 2013-01-28 DIAGNOSIS — L02419 Cutaneous abscess of limb, unspecified: Secondary | ICD-10-CM | POA: Diagnosis present

## 2013-01-28 DIAGNOSIS — T502X5A Adverse effect of carbonic-anhydrase inhibitors, benzothiadiazides and other diuretics, initial encounter: Secondary | ICD-10-CM | POA: Diagnosis present

## 2013-01-28 DIAGNOSIS — R109 Unspecified abdominal pain: Secondary | ICD-10-CM

## 2013-01-28 DIAGNOSIS — E860 Dehydration: Secondary | ICD-10-CM | POA: Diagnosis present

## 2013-01-28 DIAGNOSIS — Z87891 Personal history of nicotine dependence: Secondary | ICD-10-CM

## 2013-01-28 DIAGNOSIS — D72829 Elevated white blood cell count, unspecified: Secondary | ICD-10-CM

## 2013-01-28 LAB — BASIC METABOLIC PANEL
BUN: 97 mg/dL — ABNORMAL HIGH (ref 6–23)
CO2: 26 mEq/L (ref 19–32)
Glucose, Bld: 180 mg/dL — ABNORMAL HIGH (ref 70–99)
Potassium: 6.1 mEq/L — ABNORMAL HIGH (ref 3.5–5.1)
Sodium: 131 mEq/L — ABNORMAL LOW (ref 135–145)

## 2013-01-28 LAB — CBC
HCT: 33.6 % — ABNORMAL LOW (ref 36.0–46.0)
HCT: 30.8 % — ABNORMAL LOW (ref 36.0–46.0)
Hemoglobin: 10.8 g/dL — ABNORMAL LOW (ref 12.0–15.0)
Hemoglobin: 10 g/dL — ABNORMAL LOW (ref 12.0–15.0)
MCH: 30.5 pg (ref 26.0–34.0)
MCV: 93.9 fL (ref 78.0–100.0)
Platelets: 241 10*3/uL (ref 150–400)
RBC: 3.56 MIL/uL — ABNORMAL LOW (ref 3.87–5.11)
RBC: 3.28 MIL/uL — ABNORMAL LOW (ref 3.87–5.11)

## 2013-01-28 LAB — URINALYSIS, ROUTINE W REFLEX MICROSCOPIC
Glucose, UA: NEGATIVE mg/dL
Specific Gravity, Urine: 1.023 (ref 1.005–1.030)
Urobilinogen, UA: 0.2 mg/dL (ref 0.0–1.0)
pH: 5 (ref 5.0–8.0)

## 2013-01-28 LAB — URINE MICROSCOPIC-ADD ON

## 2013-01-28 MED ORDER — CALCIUM CARBONATE-VITAMIN D 500-200 MG-UNIT PO TABS
1.0000 | ORAL_TABLET | Freq: Every day | ORAL | Status: DC
  Administered 2013-01-28 – 2013-02-01 (×5): 1 via ORAL
  Filled 2013-01-28 (×5): qty 1

## 2013-01-28 MED ORDER — VANCOMYCIN HCL 10 G IV SOLR
1750.0000 mg | INTRAVENOUS | Status: DC
  Administered 2013-01-30: 1750 mg via INTRAVENOUS
  Filled 2013-01-28: qty 1750

## 2013-01-28 MED ORDER — SODIUM POLYSTYRENE SULFONATE 15 GM/60ML PO SUSP
45.0000 g | Freq: Once | ORAL | Status: AC
  Administered 2013-01-28: 45 g via ORAL
  Filled 2013-01-28: qty 240

## 2013-01-28 MED ORDER — SODIUM CHLORIDE 0.9 % IV BOLUS (SEPSIS)
500.0000 mL | Freq: Once | INTRAVENOUS | Status: AC
  Administered 2013-01-28: 500 mL via INTRAVENOUS

## 2013-01-28 MED ORDER — HYDRALAZINE HCL 20 MG/ML IJ SOLN
10.0000 mg | Freq: Four times a day (QID) | INTRAMUSCULAR | Status: DC | PRN
  Filled 2013-01-28: qty 0.5

## 2013-01-28 MED ORDER — ONDANSETRON HCL 4 MG PO TABS: 4.0000 mg | ORAL_TABLET | Freq: Four times a day (QID) | ORAL | Status: DC | PRN

## 2013-01-28 MED ORDER — INSULIN ASPART 100 UNIT/ML ~~LOC~~ SOLN
10.0000 [IU] | Freq: Once | SUBCUTANEOUS | Status: AC
  Administered 2013-01-28: 10 [IU] via INTRAVENOUS
  Filled 2013-01-28 (×2): qty 1

## 2013-01-28 MED ORDER — CLOPIDOGREL BISULFATE 75 MG PO TABS
75.0000 mg | ORAL_TABLET | Freq: Every day | ORAL | Status: DC
  Administered 2013-01-28 – 2013-02-01 (×5): 75 mg via ORAL
  Filled 2013-01-28 (×5): qty 1

## 2013-01-28 MED ORDER — AMLODIPINE BESYLATE 10 MG PO TABS
10.0000 mg | ORAL_TABLET | Freq: Every day | ORAL | Status: DC
  Administered 2013-01-28 – 2013-02-01 (×5): 10 mg via ORAL
  Filled 2013-01-28 (×5): qty 1

## 2013-01-28 MED ORDER — ADULT MULTIVITAMIN W/MINERALS CH
1.0000 | ORAL_TABLET | Freq: Every day | ORAL | Status: DC
  Administered 2013-01-28 – 2013-02-01 (×5): 1 via ORAL
  Filled 2013-01-28 (×5): qty 1

## 2013-01-28 MED ORDER — DEXTROSE 50 % IV SOLN
1.0000 | Freq: Once | INTRAVENOUS | Status: AC
  Administered 2013-01-28: 50 mL via INTRAVENOUS
  Filled 2013-01-28: qty 50

## 2013-01-28 MED ORDER — HYDROMORPHONE HCL PF 1 MG/ML IJ SOLN
1.0000 mg | INTRAMUSCULAR | Status: DC | PRN
  Administered 2013-01-29: 1 mg via INTRAVENOUS
  Filled 2013-01-28: qty 1

## 2013-01-28 MED ORDER — VANCOMYCIN HCL IN DEXTROSE 1-5 GM/200ML-% IV SOLN: 1000.0000 mg | Freq: Once | INTRAVENOUS | Status: DC

## 2013-01-28 MED ORDER — ALUM & MAG HYDROXIDE-SIMETH 200-200-20 MG/5ML PO SUSP
30.0000 mL | Freq: Four times a day (QID) | ORAL | Status: DC | PRN
Start: 2013-01-28 — End: 2013-02-01
  Filled 2013-01-28: qty 30

## 2013-01-28 MED ORDER — HYDROCODONE-ACETAMINOPHEN 5-325 MG PO TABS
1.0000 | ORAL_TABLET | Freq: Once | ORAL | Status: AC
  Administered 2013-01-28: 1 via ORAL
  Filled 2013-01-28: qty 1

## 2013-01-28 MED ORDER — SODIUM CHLORIDE 0.9 % IJ SOLN
3.0000 mL | Freq: Two times a day (BID) | INTRAMUSCULAR | Status: DC
  Administered 2013-01-30 – 2013-02-01 (×5): 3 mL via INTRAVENOUS

## 2013-01-28 MED ORDER — FAMOTIDINE 10 MG PO TABS
10.0000 mg | ORAL_TABLET | Freq: Two times a day (BID) | ORAL | Status: DC
  Administered 2013-01-28 – 2013-02-01 (×8): 10 mg via ORAL
  Filled 2013-01-28 (×10): qty 1

## 2013-01-28 MED ORDER — CYCLOBENZAPRINE HCL 10 MG PO TABS
10.0000 mg | ORAL_TABLET | Freq: Three times a day (TID) | ORAL | Status: DC | PRN
  Administered 2013-01-30 – 2013-02-01 (×2): 10 mg via ORAL
  Filled 2013-01-28 (×2): qty 1

## 2013-01-28 MED ORDER — VANCOMYCIN HCL IN DEXTROSE 1-5 GM/200ML-% IV SOLN
1000.0000 mg | Freq: Once | INTRAVENOUS | Status: AC
  Administered 2013-01-28: 1000 mg via INTRAVENOUS
  Filled 2013-01-28 (×2): qty 200

## 2013-01-28 MED ORDER — SODIUM CHLORIDE 0.9 % IV SOLN
INTRAVENOUS | Status: DC
  Administered 2013-01-28 – 2013-01-29 (×2): via INTRAVENOUS
  Administered 2013-01-30: 75 mL/h via INTRAVENOUS
  Administered 2013-01-30: 03:00:00 via INTRAVENOUS

## 2013-01-28 MED ORDER — ACETAMINOPHEN 325 MG PO TABS
650.0000 mg | ORAL_TABLET | Freq: Four times a day (QID) | ORAL | Status: DC | PRN
  Administered 2013-01-28 – 2013-01-31 (×2): 650 mg via ORAL
  Filled 2013-01-28: qty 2

## 2013-01-28 MED ORDER — ENOXAPARIN SODIUM 30 MG/0.3ML ~~LOC~~ SOLN
30.0000 mg | SUBCUTANEOUS | Status: DC
  Administered 2013-01-28: 30 mg via SUBCUTANEOUS
  Filled 2013-01-28 (×2): qty 0.3

## 2013-01-28 MED ORDER — ONDANSETRON HCL 4 MG/2ML IJ SOLN: 4.0000 mg | Freq: Four times a day (QID) | INTRAMUSCULAR | Status: DC | PRN

## 2013-01-28 MED ORDER — ACETAMINOPHEN 650 MG RE SUPP: 650.0000 mg | Freq: Four times a day (QID) | RECTAL | Status: DC | PRN

## 2013-01-28 MED ORDER — OXYCODONE HCL 5 MG PO TABS
5.0000 mg | ORAL_TABLET | ORAL | Status: DC | PRN
  Administered 2013-01-30 (×2): 5 mg via ORAL
  Filled 2013-01-28 (×2): qty 1

## 2013-01-28 NOTE — ED Notes (Signed)
Per [facility] pt came from home where she slid out of her wheel chair to the ground and was unable to get up. Pt also having diarrhea for the past day and skin infections bilaterally in lower extremities.

## 2013-01-28 NOTE — ED Provider Notes (Signed)
History     CSN: [id number]  Arrival date & time 01/28/13  1125   First MD Initiated Contact with Patient 01/28/13 1143      Chief Complaint  Patient presents with  . Diarrhea  . Cellulitis  . Fall    (Consider location/radiation/quality/duration/timing/severity/associated sxs/prior treatment) HPI Pt presents with c/o fall from a low wheelchair- pt states she slid from the chair and denies any pain or injury she states "if you touch the ground there they call it a fall".  She is also concerned about increased swelling and weeping of fluid from bilateral lower legs.  She has chronic venous stasis changes in her lower legs.  She does take lasix once daily.  No fever/chills.  No chest pain or shortness of breath.  There are no other associated systemic symptoms, there are no other alleviating or modifying factors.   Past Medical History  Diagnosis Date  . Hypertension   . Stroke 09/2000    couldn't move her left foot which resolved  . Edema   . Pernicious anemia   . Glucose-6-phosphate dehydrogenase deficiency     possible diagnosis of G6PD per patient "phosphate 6 deficiency"  . Osteoarthritis     Past Surgical History  Procedure Date  . Other surgical history     cyst from panty line removed as child  . Bone marrow biopsy 12/1966    for anemia    Family History  Problem Relation Age of Onset  . Diabetes Brother   . Diabetes Brother   . Heart failure Mother 67  . Heart attack Father 62    History  Substance Use Topics  . Smoking status: Former Smoker    Types: Cigarettes    Quit date: 12/29/1993  . Smokeless tobacco: Not on file  . Alcohol Use: Not on file    OB History    Grav Para Term Preterm Abortions TAB SAB Ect Mult Living                  Review of Systems ROS reviewed and all otherwise negative except for mentioned in HPI  Allergies  Asa; Codeine; Penicillins; and Phosphate  Home Medications   Current Outpatient Rx  Name  Route  Sig   Dispense  Refill  . ALISKIREN FUMARATE 300 MG PO TABS   Oral   Take 300 mg by mouth daily.         Marland Kitchen AMLODIPINE BESYLATE 10 MG PO TABS   Oral   Take 10 mg by mouth daily.         Marland Kitchen BIOTIN PO   Oral   Take 1 tablet by mouth daily.         Marland Kitchen CALCIUM + D PO   Oral   Take 1 tablet by mouth daily.         Marland Kitchen CLOPIDOGREL BISULFATE 75 MG PO TABS   Oral   Take 75 mg by mouth daily.         . CYCLOBENZAPRINE HCL 10 MG PO TABS   Oral   Take 10 mg by mouth 3 (three) times daily as needed. For muscle spasms         . FUROSEMIDE 40 MG PO TABS   Oral   Take 40 mg by mouth daily.         Marland Kitchen HYDROCODONE-ACETAMINOPHEN 5-500 MG PO TABS   Oral   Take 1 tablet by mouth 2 (two) times daily as needed. For pain         .  ADULT MULTIVITAMIN W/MINERALS CH   Oral   Take 1 tablet by mouth daily.         Marland Kitchen OLMESARTAN MEDOXOMIL-HCTZ 40-25 MG PO TABS   Oral   Take 1 tablet by mouth daily.         Marland Kitchen POTASSIUM CHLORIDE CRYS ER 10 MEQ PO TBCR   Oral   Take 10 mEq by mouth daily.           BP 102/84  Pulse 100  Temp 97.4 F (36.3 C) (Oral)  Resp 20  SpO2 100% Vitals reviewed Physical Exam Physical Examination: General appearance - alert, well appearing, and in no distress Mental status - alert, oriented to person, place, and time Eyes - pupils equal and reactive, no scleral icterus, no conjunctival injection Mouth - mucous membranes moist, pharynx normal without lesions Chest - clear to auscultation, no wheezes, rales or rhonchi, symmetric air entry Heart - normal rate, regular rhythm, normal S1, S2, no murmurs, rubs, clicks or gallops Abdomen - soft, nontender, nondistended, no masses or organomegaly, nabs Extremities - peripheral pulses normal, no pedal edema, no clubbing or cyanosis Skin - normal coloration and turgor except bilateral lower extremities with lymphedema/chronic venous stasis changes also with multiple weeping ulcerations with some surrounding warmth  and erythema, no prurulent drainage  ED Course  Procedures (including critical care time)   Date: 01/28/2013  Rate: sinus rhythm with first degree AV block  Rhythm: 98  QRS Axis: normal  Intervals: PR prolonged  ST/T Wave abnormalities: normal  Conduction Disutrbances:first-degree A-V block   Narrative Interpretation:   Old EKG Reviewed: none available   4:16 PM  D/w [facility] hospitalist for admission, pt to go to team 9, tele bed.    CRITICAL CARE Performed by: Ethelda Chick   Total critical care time: 35  Critical care time was exclusive of separately billable procedures and treating other patients.  Critical care was necessary to treat or prevent imminent or life-threatening deterioration.  Critical care was time spent personally by me on the following activities: development of treatment plan with patient and/or surrogate as well as nursing, discussions with consultants, evaluation of patient's response to treatment, examination of patient, obtaining history from patient or surrogate, ordering and performing treatments and interventions, ordering and review of laboratory studies, ordering and review of radiographic studies, pulse oximetry and re-evaluation of patient's condition.  Labs Reviewed  CBC - Abnormal; Notable for the following:    WBC 14.6 (*)     RBC 3.56 (*)     Hemoglobin 10.8 (*)     HCT 33.6 (*)     All other components within normal limits  BASIC METABOLIC PANEL - Abnormal; Notable for the following:    Sodium 131 (*)     Potassium 6.1 (*)     Glucose, Bld 180 (*)     BUN 97 (*)     Creatinine, Ser 3.70 (*)     GFR calc non Af Amer 11 (*)     GFR calc Af Amer 13 (*)     All other components within normal limits  URINALYSIS, ROUTINE W REFLEX MICROSCOPIC - Abnormal; Notable for the following:    APPearance CLOUDY (*)     Hgb urine dipstick TRACE (*)     Leukocytes, UA TRACE (*)     All other components within normal limits  URINE MICROSCOPIC-ADD ON  - Abnormal; Notable for the following:    Squamous Epithelial / LPF MANY (*)  Bacteria, UA FEW (*)     All other components within normal limits   Dg Chest 2 View  01/28/2013  *RADIOLOGY REPORT*  Clinical Data: Diarrhea, cellulitis, post fall  CHEST - 2 VIEW  Comparison: 10/12/2012; CT abdomen pelvis - 10/11/2012  Findings:  Grossly unchanged enlarged cardiac silhouette and mediastinal contours with mild prominence of bilateral pulmonary hila.  There is grossly unchanged mild diffuse thickening of the pulmonary interstitium.  No focal airspace opacities.  No pleural effusion or pneumothorax.  No definite evidence of pulmonary edema.  Grossly unchanged bones including severe bilateral glenohumeral joint degenerative changes, incompletely evaluated.  IMPRESSION: 1.  Grossly unchanged borderline enlarged cardiac silhouette with prominence of the bilateral pulmonary hila, nonspecific but may be seen in the setting of pulmonary arterial hypertension, although note, hilar adenopathy may have a similar appearance.  Further evaluation with chest CT and/or cardiac echo may be performed as clinically indicated. 2.  Grossly unchanged mild diffuse bronchitic change.   Original Report Authenticated By: Tacey Ruiz, MD      1. Renal failure   2. Hyperkalemia   3. Cellulitis of multiple sites of lower extremity   4. Leukocytosis       MDM  Pt presenting with c/o fall from her wheelchair.  Does not appear to have suffered any injuries.  She does have ulcerations and increased swelling of her lower extremities.  On further workup she has mild leukocytosis so started on vanc due to concern for cellulitis, also found to have renal failure with hyperkalemia.  No ekg changes.  Pt given insulin and glucose as well as kayexalate po.  Will be admitted to telemetry bed to [facility] service.          Ethelda Chick, MD 01/28/13 [phone]

## 2013-01-28 NOTE — ED Notes (Signed)
Pt transported to radiology.

## 2013-01-28 NOTE — H&P (Addendum)
History and Physical       Hospital Admission Note Date: 01/28/2013  Patient name: Connie Miranda Medical record number: [id number] Date of birth: 1933/11/01 Age: 79 y.o. Gender: female PCP: [name], MD    Chief Complaint:  Was sent from the skilled nursing facility for fall and was found to have acute renal failure, with Cr 3.7  HPI: Patient is a 79 year old female, resident of SNF, with history of hypertension on multiple antihypertensives, stroke without any residual deficits, history of anemia with possible G6PD deficiency and osteoarthritis was sent from the skilled nursing facility after of. History was obtained from the patient who is somewhat poor historian. Patient states that she actually slid from the her wheelchair and did not fall. She denied hitting her head. Patient was brought to the ER and her lab work showed acute renal insufficiency with creatinine of 3.7, BUN 97, leukocytosis with WBC 14.6, potassium of 6.1. Her baseline creatinine is normal at 1.0.  Patient also reported having nausea but no vomiting or diarrhea in the last 2 days.  Review of Systems:  Constitutional: Denies fever, chills, diaphoresis, appetite change and fatigue.  HEENT: Denies photophobia, eye pain, redness, hearing loss, ear pain, congestion, sore throat, rhinorrhea, sneezing, mouth sores, trouble swallowing, neck pain, neck stiffness and tinnitus.   Respiratory: Denies SOB, DOE, cough, chest tightness,  and wheezing.   Cardiovascular: Denies chest pain, palpitations and leg swelling.  Gastrointestinal: Denies  vomiting, abdominal pain, diarrhea, constipation, blood in stool and abdominal distention. + nausea Genitourinary: Denies dysuria, urgency, frequency, hematuria, flank pain and difficulty urinating.  Musculoskeletal: Denies myalgias, back pain, joint swelling, arthralgias and gait problem.  Skin: Please see examination below patient  has massive lymphedema with significant cellulitis, open blisters and chronic skin changes on the bilateral lower legs Neurological: Denies dizziness, seizures, syncope, weakness, light-headedness, numbness and headaches.  Hematological: Denies adenopathy. Easy bruising, personal or family bleeding history  Psychiatric/Behavioral: Denies suicidal ideation, mood changes, confusion, nervousness, sleep disturbance and agitation  Past Medical History: Past Medical History  Diagnosis Date  . Hypertension   . Stroke 09/2000    couldn't move her left foot which resolved  . Edema   . Pernicious anemia   . Glucose-6-phosphate dehydrogenase deficiency     possible diagnosis of G6PD per patient "phosphate 6 deficiency"  . Osteoarthritis    Past Surgical History  Procedure Date  . Other surgical history     cyst from panty line removed as child  . Bone marrow biopsy 12/1966    for anemia    Medications: Prior to Admission medications   Medication Sig Start Date End Date Taking? Authorizing Provider  aliskiren (TEKTURNA) 300 MG tablet Take 300 mg by mouth daily.   Yes Historical Provider, MD  amLODipine (NORVASC) 10 MG tablet Take 10 mg by mouth daily.   Yes Historical Provider, MD  BIOTIN PO Take 1 tablet by mouth daily.   Yes Historical Provider, MD  Calcium Carbonate-Vitamin D (CALCIUM + D PO) Take 1 tablet by mouth daily.   Yes Historical Provider, MD  clopidogrel (PLAVIX) 75 MG tablet Take 75 mg by mouth daily.   Yes Historical Provider, MD  cyclobenzaprine (FLEXERIL) 10 MG tablet Take 10 mg by mouth 3 (three) times daily as needed. For muscle spasms   Yes Historical Provider, MD  furosemide (LASIX) 40 MG tablet Take 40 mg by mouth daily.   Yes Historical Provider, MD  HYDROcodone-acetaminophen (VICODIN) 5-500 MG per tablet Take 1 tablet  by mouth 2 (two) times daily as needed. For pain 10/19/12  Yes Stephani Police, PA  Multiple Vitamin (MULTIVITAMIN WITH MINERALS) TABS Take 1 tablet by  mouth daily.   Yes Historical Provider, MD  olmesartan-hydrochlorothiazide (BENICAR HCT) 40-25 MG per tablet Take 1 tablet by mouth daily.   Yes Historical Provider, MD  potassium chloride (K-DUR,KLOR-CON) 10 MEQ tablet Take 10 mEq by mouth daily.   Yes Historical Provider, MD    Allergies:   Allergies  Allergen Reactions  . Asa (Aspirin) Other (See Comments)    Doctor told her not to take due to type anemia  . Codeine Other (See Comments)    Made her too sleepy  . Penicillins Itching and Swelling  . Phosphate Other (See Comments)    Anything containing phosphate 6. Unknown reaction    Social History:  reports that she quit smoking about 19 years ago. Her smoking use included Cigarettes. She does not have any smokeless tobacco history on file. Her alcohol and drug histories not on file.  Family History: Family History  Problem Relation Age of Onset  . Diabetes Brother   . Diabetes Brother   . Heart failure Mother 73  . Heart attack Father 37    Physical Exam: Blood pressure 102/84, pulse 100, temperature 97.4 F (36.3 C), temperature source Oral, resp. rate 20, SpO2 100.00%. General: Alert, awake, oriented x3, in no acute distress. HEENT: normocephalic, atraumatic, anicteric sclera, pink conjunctiva, pupils equal and reactive to light and accomodation, oropharynx clear Neck: supple, no masses or lymphadenopathy, no goiter, no bruits  Heart: Regular rate and rhythm, without murmurs, rubs or gallops. Lungs: Clear to auscultation bilaterally, no wheezing, rales or rhonchi. Abdomen: Morbidly obese, Soft, nontender, nondistended, positive bowel sounds, no masses. Extremities: Massive lymphedema with chronic venous stasis, open blisters, foul-smelling both legs Neuro: Grossly intact, no focal neurological deficits, strength 5/5 upper and lower extremities bilaterally Psych: alert and oriented x 3, normal mood and affect Skin: please see extremity exam  LABS on Admission:  Basic  Metabolic Panel:  Lab 01/28/13 5621  NA 131*  K 6.1*  CL 96  CO2 26  GLUCOSE 180*  BUN 97*  CREATININE 3.70*  CALCIUM 9.8  MG --  PHOS --   CBC:  Lab 01/28/13 1157  WBC 14.6*  NEUTROABS --  HGB 10.8*  HCT 33.6*  MCV 94.4  PLT 278     Radiological Exams on Admission: Dg Chest 2 View  01/28/2013  *RADIOLOGY REPORT*  Clinical Data: Diarrhea, cellulitis, post fall  CHEST - 2 VIEW  Comparison: 10/12/2012; CT abdomen pelvis - 10/11/2012  Findings:  Grossly unchanged enlarged cardiac silhouette and mediastinal contours with mild prominence of bilateral pulmonary hila.  There is grossly unchanged mild diffuse thickening of the pulmonary interstitium.  No focal airspace opacities.  No pleural effusion or pneumothorax.  No definite evidence of pulmonary edema.  Grossly unchanged bones including severe bilateral glenohumeral joint degenerative changes, incompletely evaluated.  IMPRESSION: 1.  Grossly unchanged borderline enlarged cardiac silhouette with prominence of the bilateral pulmonary hila, nonspecific but may be seen in the setting of pulmonary arterial hypertension, although note, hilar adenopathy may have a similar appearance.  Further evaluation with chest CT and/or cardiac echo may be performed as clinically indicated. 2.  Grossly unchanged mild diffuse bronchitic change.   Original Report Authenticated By: Tacey Ruiz, MD     Assessment/Plan Principal Problem:  *Acute renal failure: Most likely caused by multiple antihypertensives (Tekturna, Benicar, HCTZ,  Lasix)  in the setting of poor appetite, nausea dehydration,  -  Admit to telemetry, hold all above antihypertensives, obtain renal ultrasound to rule out any obstruction/hydronephrosis - Place on gentle hydration with IV fluids   Active Problems:  Hyperkalemia: Likely exacerbated by potassium replacement, Tekturna - Patient has been given insulin, D50, Kayexalate in ED, repeat BMET - Hold potassium replacement,  Tekturna   Hypertension: Currently stable, continue Norvasc, PRN hydralazine    History of Stroke: Continue Plavix    Lymphedema with Cellulitis - Wound care consult, place on vancomycin IV  - Will need aggressive wound care at the nursing facility, wraps, elevate lower extremities   Fall: Patient actually denies having mechanical fall  - Will obtain PT evaluation   Nausea: - Obtain KUB, liquid diet for today and advance to solids if tolerated, pepcid, Zofran PRN  DVT prophylaxis: lovenox  CODE STATUS: FULL CODE  Further plan will depend as patient's clinical course evolves and further radiologic and laboratory data become available.   Time Spent on Admission: 1 HOUR  RAI,RIPUDEEP M.D. [facility] 01/28/2013, 4:44 PM Pager: [phone]  If 7PM-7AM, please contact night-coverage [email or web address] Password [email or web address]   Addendum:  Please note, patient is from home and not from SNF as by error mentioned in my H&P   RAI,RIPUDEEP M.D. [facility] 01/31/2013, 8:56 AM  Pager: [phone]

## 2013-01-28 NOTE — Progress Notes (Signed)
ANTIBIOTIC CONSULT NOTE - INITIAL  Pharmacy Consult for Vancomycin Indication: b/l lower leg cellulitis  Allergies  Allergen Reactions  . Asa (Aspirin) Other (See Comments)    Doctor told her not to take due to type anemia  . Codeine Other (See Comments)    Made her too sleepy  . Penicillins Itching and Swelling  . Phosphate Other (See Comments)    Anything containing phosphate 6. Unknown reaction    Patient Measurements:   Ht: 64 in Wt: 125 kg (stated) Adjusted Body Weight:   Vital Signs: Temp: 97.4 F (36.3 C) (01/31 1129) Temp src: Oral (01/31 1129) BP: 102/84 mmHg (01/31 1505) Pulse Rate: 100  (01/31 1505) Intake/Output from previous day:   Intake/Output from this shift:    Labs:  Basename 01/28/13 1157  WBC 14.6*  HGB 10.8*  PLT 278  LABCREA --  CREATININE 3.70*   The CrCl is unknown because both a height and weight (above a minimum accepted value) are required for this calculation. No results found for this basename: VANCOTROUGH:2,VANCOPEAK:2,VANCORANDOM:2,GENTTROUGH:2,GENTPEAK:2,GENTRANDOM:2,TOBRATROUGH:2,TOBRAPEAK:2,TOBRARND:2,AMIKACINPEAK:2,AMIKACINTROU:2,AMIKACIN:2, in the last 72 hours   Microbiology: No results found for this or any previous visit (from the past 720 hour(s)).  Medical History: Past Medical History  Diagnosis Date  . Hypertension   . Stroke 09/2000    couldn't move her left foot which resolved  . Edema   . Pernicious anemia   . Glucose-6-phosphate dehydrogenase deficiency     possible diagnosis of G6PD per patient "phosphate 6 deficiency"  . Osteoarthritis     Medications:  See electronic med rec  Assessment: 79 y.o. female presents s/p fall from low wheelchair. She is concerned with swelling/weeping of fluid from b/l lower legs. Noted h/o chronic venous stasis changes. To continue Vancomycin for cellulitis. Vanc 1gm IV given in ED ~1600. Creat 3.7, estimated CrCl 15 ml/min.  Goal of Therapy:  Vancomycin trough level 10-15  mcg/ml  Plan:  1. Give extra Vancomycin 1gm IV now to make 2gm load then Vancomycin 1750mg  IV q48h. 2. Will f/u microbiological data, renal function, measured wt, and pt's clinical condition. 3. Will check trough at Css or random level sooner if Creat worsens.  Christoper Fabian, PharmD, BCPS Clinical pharmacist, pager [phone] 01/28/2013,4:47 PM

## 2013-01-28 NOTE — ED Notes (Signed)
Phlebotomy at bedside.

## 2013-01-29 ENCOUNTER — Inpatient Hospital Stay (HOSPITAL_COMMUNITY): Payer: Medicare Other

## 2013-01-29 DIAGNOSIS — R609 Edema, unspecified: Secondary | ICD-10-CM

## 2013-01-29 LAB — BASIC METABOLIC PANEL
BUN: 82 mg/dL — ABNORMAL HIGH (ref 6–23)
Chloride: 101 mEq/L (ref 96–112)
GFR calc Af Amer: 21 mL/min — ABNORMAL LOW (ref >90)
Potassium: 4.5 mEq/L (ref 3.5–5.1)

## 2013-01-29 LAB — CBC
HCT: 30.6 % — ABNORMAL LOW (ref 36.0–46.0)
Hemoglobin: 9.9 g/dL — ABNORMAL LOW (ref 12.0–15.0)
MCHC: 32.4 g/dL (ref 30.0–36.0)
RBC: 3.25 MIL/uL — ABNORMAL LOW (ref 3.87–5.11)

## 2013-01-29 MED ORDER — VANCOMYCIN HCL IN DEXTROSE 1-5 GM/200ML-% IV SOLN
1000.0000 mg | Freq: Once | INTRAVENOUS | Status: AC
  Administered 2013-01-29: 1000 mg via INTRAVENOUS
  Filled 2013-01-29: qty 200

## 2013-01-29 MED ORDER — ENOXAPARIN SODIUM 40 MG/0.4ML ~~LOC~~ SOLN
40.0000 mg | SUBCUTANEOUS | Status: DC
  Administered 2013-01-29 – 2013-01-30 (×2): 40 mg via SUBCUTANEOUS
  Filled 2013-01-29 (×3): qty 0.4

## 2013-01-29 NOTE — Progress Notes (Signed)
Patient ID: Connie Miranda  female  WUJ:[id number]    DOB: 1933-01-27    DOA: 01/28/2013  PCP: [name], MD  Assessment/Plan: Principal problem; Acute renal failure: Most likely caused by multiple antihypertensives (Tekturna, Benicar, HCTZ, Lasix) in the setting of poor appetite, nausea dehydration, improving -Continue to hold all above antihypertensives - renal ultrasound was obtained and was negative for any obstruction/hydronephrosis  -  continue gentle hydration with IV fluids   Active Problems:  Hyperkalemia: Resolved Likely exacerbated by potassium replacement, Tekturna  - Hold potassium replacement, Tekturna   Hypertension: Currently stable, continue Norvasc, PRN hydralazine   History of Stroke: Continue Plavix   Lymphedema with Cellulitis  - Wound care consult, place on vancomycin IV  - Will need aggressive wound care at the nursing facility, wraps, elevate lower extremities   Fall: Patient actually denies having mechanical fall.  - Will obtain PT evaluation. Patient states that she lives at home (by error mentioned in H&P by myself that she was sent from skilled nursing facility). - Knee Xray (complaining of right knee pain)   Nausea: Resolved - KUB showed no acute finding, advance to solids, cont pepcid, Zofran PRN   DVT Prophylaxis:  Code Status:  Disposition: Patient lives at home, PT evaluation    Subjective: Feels a whole lot better today, alert and awake, no nausea vomiting, tolerating a liquid diet, complaining of some right knee pain  Objective: Weight change:   Intake/Output Summary (Last 24 hours) at 01/29/13 1323 Last data filed at 01/29/13 0700  Gross per 24 hour  Intake  812.5 ml  Output      0 ml  Net  812.5 ml   Blood pressure 109/40, pulse 90, temperature 99.6 F (37.6 C), temperature source Oral, resp. rate 18, height 5\' 4"  (1.626 m), weight 122.5 kg (270 lb 1 oz), SpO2 94.00%.  Physical Exam: General: Alert and awake, oriented  x3, not in any acute distress. HEENT: anicteric sclera, pupils reactive to light and accommodation, EOMI CVS: S1-S2 clear, no murmur rubs or gallops Chest: clear to auscultation bilaterally, no wheezing, rales or rhonchi Abdomen: soft nontender, nondistended, normal bowel sounds Extremities:b/l wraps  Lab Results: Basic Metabolic Panel:  Lab 01/29/13 9562 01/28/13 2113 01/28/13 1646 01/28/13 1157  NA 135 -- -- 131*  K 4.5 -- 5.7* --  CL 101 -- -- 96  CO2 29 -- -- 26  GLUCOSE 120* -- -- 180*  BUN 82* -- -- 97*  CREATININE 2.47* 2.98* -- --  CALCIUM 9.2 -- -- 9.8  MG -- -- -- --  PHOS -- -- -- --   Liver Function Tests: No results found for this basename: AST:2,ALT:2,ALKPHOS:2,BILITOT:2,PROT:2,ALBUMIN:2 in the last 168 hours No results found for this basename: LIPASE:2,AMYLASE:2 in the last 168 hours No results found for this basename: AMMONIA:2 in the last 168 hours CBC:  Lab 01/29/13 0530 01/28/13 2113  WBC 10.9* 12.1*  NEUTROABS -- --  HGB 9.9* 10.0*  HCT 30.6* 30.8*  MCV 94.2 93.9  PLT 251 241    Studies/Results: Dg Chest 2 View  01/28/2013  *RADIOLOGY REPORT*  Clinical Data: Diarrhea, cellulitis, post fall  CHEST - 2 VIEW  Comparison: 10/12/2012; CT abdomen pelvis - 10/11/2012  Findings:  Grossly unchanged enlarged cardiac silhouette and mediastinal contours with mild prominence of bilateral pulmonary hila.  There is grossly unchanged mild diffuse thickening of the pulmonary interstitium.  No focal airspace opacities.  No pleural effusion or pneumothorax.  No definite evidence of pulmonary edema.  Grossly unchanged bones including severe bilateral glenohumeral joint degenerative changes, incompletely evaluated.  IMPRESSION: 1.  Grossly unchanged borderline enlarged cardiac silhouette with prominence of the bilateral pulmonary hila, nonspecific but may be seen in the setting of pulmonary arterial hypertension, although note, hilar adenopathy may have a similar appearance.   Further evaluation with chest CT and/or cardiac echo may be performed as clinically indicated. 2.  Grossly unchanged mild diffuse bronchitic change.   Original Report Authenticated By: Tacey Ruiz, MD    Dg Abd 1 View  01/28/2013  *RADIOLOGY REPORT*  Clinical Data: Nausea.  ABDOMEN - 1 VIEW  Comparison: None.  Findings: No evidence of dilated bowel loops.  Stool noted in the left colon which is nondilated.  No free air visualized.  IUD noted.  No radiopaque calculi identified.  IMPRESSION: No acute findings.   Original Report Authenticated By: Myles Rosenthal, M.D.    US Renal  01/29/2013  *RADIOLOGY REPORT*  Clinical Data: Acute renal failure  RENAL/URINARY TRACT ULTRASOUND COMPLETE  Comparison:  CT abdomen pelvis - 10/11/2012  Findings:  Right Kidney:  Normal cortical thickness, echogenicity and size, measuring 10.1 cm in length.  No focal renal lesions.  No echogenic renal stones.  No urinary obstruction.  Left Kidney:  Normal cortical thickness, echogenicity and size, measuring 10.3 cm in length.  No focal renal lesions.  No echogenic renal stones.  No urinary obstruction.  Bladder:  Normal given degree of distension  Incidental note is made of two discrete echogenic shadowing stones with an otherwise normal-appearing gallbladder.  IMPRESSION: 1.  Normal renal ultrasound.  Specifically, no evidence of urinary obstruction or sonographic evidence of medical renal disease. 2.  Cholelithiasis incidentally noted with an otherwise normal- appearing gallbladder.   Original Report Authenticated By: Tacey Ruiz, MD     Medications: Scheduled Meds:   . amLODipine  10 mg Oral Daily  . calcium-vitamin D  1 tablet Oral Daily  . clopidogrel  75 mg Oral Daily  . enoxaparin (LOVENOX) injection  30 mg Subcutaneous Q24H  . famotidine  10 mg Oral BID  . multivitamin with minerals  1 tablet Oral Daily  . sodium chloride  3 mL Intravenous Q12H  . vancomycin  1,750 mg Intravenous Q48H  . vancomycin  1,000 mg  Intravenous Once      LOS: 1 day   RAI,RIPUDEEP M.D. [facility] 01/29/2013, 1:23 PM Pager: [phone]  If 7PM-7AM, please contact night-coverage [email or web address] Password [email or web address]

## 2013-01-29 NOTE — Progress Notes (Signed)
Pt arrived on unit from ED via stretcher. Patient oriented to unit. Telemetry placed per MD order. Steele Berg RN

## 2013-01-30 LAB — BASIC METABOLIC PANEL
BUN: 58 mg/dL — ABNORMAL HIGH (ref 6–23)
CO2: 27 mEq/L (ref 19–32)
Chloride: 104 mEq/L (ref 96–112)
Glucose, Bld: 94 mg/dL (ref 70–99)
Potassium: 3.6 mEq/L (ref 3.5–5.1)
Sodium: 138 mEq/L (ref 135–145)

## 2013-01-30 LAB — URINE CULTURE

## 2013-01-30 MED ORDER — WHITE PETROLATUM GEL
Status: AC
  Administered 2013-01-30: 0.2
  Filled 2013-01-30: qty 5

## 2013-01-30 NOTE — Progress Notes (Signed)
Patient ID: Connie Miranda  female  [id number]    DOB: 01-Jun-1933    DOA: 01/28/2013  PCP: [name], MD  Assessment/Plan: Principal problem; Acute renal failure: Most likely caused by multiple antihypertensives (Tekturna, Benicar, HCTZ, Lasix) in the setting of poor appetite, nausea dehydration, improving, Cr 1.78  -Continue to hold all above antihypertensives - renal ultrasound was obtained and was negative for any obstruction/hydronephrosis  -  continue gentle hydration with IV fluids today, saline lock in AM  Active Problems:  Hyperkalemia: Resolved Likely exacerbated by potassium replacement, Tekturna  - Hold potassium replacement, Tekturna   Hypertension: Currently stable, continue Norvasc, PRN hydralazine   History of Stroke: Continue Plavix   Lymphedema with Cellulitis  - Wound care consult, place on vancomycin IV  - Will need aggressive wound care, wraps, elevate lower extremities, if set up appt at wound care ctr for f/u    Fall: Patient actually denies having mechanical fall.  - Will obtain PT evaluation. Patient states that she lives at home (by error mentioned in H&P by myself that she was sent from skilled nursing facility). - Knee Xray (complaining of right knee pain) showed arthritis, no fracture or dislocation   Nausea: Resolved - KUB showed no acute finding, tolerating solids, cont pepcid, Zofran PRN  ?Missed beats, 1.25 sec: Asymptomatic, obtain 12 lead EKG, patient is not on beta blocker   DVT Prophylaxis:  Code Status:  Disposition: Patient lives at home, PT evaluation pending    Subjective:  Feels better, no specific complaints except some knee pain which is better from yesterday  Objective: Weight change: 0.1 kg (3.5 oz)  Intake/Output Summary (Last 24 hours) at 01/30/13 1036 Last data filed at 01/30/13 0900  Gross per 24 hour  Intake   1940 ml  Output      0 ml  Net   1940 ml   Blood pressure 112/50, pulse 95, temperature 99.6 F  (37.6 C), temperature source Oral, resp. rate 20, height 5\' 4"  (1.626 m), weight 122.6 kg (270 lb 4.5 oz), SpO2 95.00%.  Physical Exam: General: Alert and awake, oriented x3, not in any acute distress. HEENT: anicteric sclera, pupils reactive to light and accommodation, EOMI CVS: S1-S2 clear, no murmur rubs or gallops Chest: clear to auscultation bilaterally, no wheezing, rales or rhonchi Abdomen: soft nontender, nondistended, normal bowel sounds Extremities:b/l wraps  Lab Results: Basic Metabolic Panel:  Lab 01/30/13 8119 01/29/13 0530  NA 138 135  K 3.6 4.5  CL 104 101  CO2 27 29  GLUCOSE 94 120*  BUN 58* 82*  CREATININE 1.78* 2.47*  CALCIUM 8.7 9.2  MG -- --  PHOS -- --   CBC:  Lab 01/29/13 0530 01/28/13 2113  WBC 10.9* 12.1*  NEUTROABS -- --  HGB 9.9* 10.0*  HCT 30.6* 30.8*  MCV 94.2 93.9  PLT 251 241    Studies/Results: Dg Chest 2 View  01/28/2013  *RADIOLOGY REPORT*  Clinical Data: Diarrhea, cellulitis, post fall  CHEST - 2 VIEW  Comparison: 10/12/2012; CT abdomen pelvis - 10/11/2012  Findings:  Grossly unchanged enlarged cardiac silhouette and mediastinal contours with mild prominence of bilateral pulmonary hila.  There is grossly unchanged mild diffuse thickening of the pulmonary interstitium.  No focal airspace opacities.  No pleural effusion or pneumothorax.  No definite evidence of pulmonary edema.  Grossly unchanged bones including severe bilateral glenohumeral joint degenerative changes, incompletely evaluated.  IMPRESSION: 1.  Grossly unchanged borderline enlarged cardiac silhouette with prominence of the bilateral pulmonary  hila, nonspecific but may be seen in the setting of pulmonary arterial hypertension, although note, hilar adenopathy may have a similar appearance.  Further evaluation with chest CT and/or cardiac echo may be performed as clinically indicated. 2.  Grossly unchanged mild diffuse bronchitic change.   Original Report Authenticated By: Tacey Ruiz, MD    Dg Abd 1 View  01/28/2013  *RADIOLOGY REPORT*  Clinical Data: Nausea.  ABDOMEN - 1 VIEW  Comparison: None.  Findings: No evidence of dilated bowel loops.  Stool noted in the left colon which is nondilated.  No free air visualized.  IUD noted.  No radiopaque calculi identified.  IMPRESSION: No acute findings.   Original Report Authenticated By: Myles Rosenthal, M.D.    US Renal  01/29/2013  *RADIOLOGY REPORT*  Clinical Data: Acute renal failure  RENAL/URINARY TRACT ULTRASOUND COMPLETE  Comparison:  CT abdomen pelvis - 10/11/2012  Findings:  Right Kidney:  Normal cortical thickness, echogenicity and size, measuring 10.1 cm in length.  No focal renal lesions.  No echogenic renal stones.  No urinary obstruction.  Left Kidney:  Normal cortical thickness, echogenicity and size, measuring 10.3 cm in length.  No focal renal lesions.  No echogenic renal stones.  No urinary obstruction.  Bladder:  Normal given degree of distension  Incidental note is made of two discrete echogenic shadowing stones with an otherwise normal-appearing gallbladder.  IMPRESSION: 1.  Normal renal ultrasound.  Specifically, no evidence of urinary obstruction or sonographic evidence of medical renal disease. 2.  Cholelithiasis incidentally noted with an otherwise normal- appearing gallbladder.   Original Report Authenticated By: Tacey Ruiz, MD     Medications: Scheduled Meds:    . amLODipine  10 mg Oral Daily  . calcium-vitamin D  1 tablet Oral Daily  . clopidogrel  75 mg Oral Daily  . enoxaparin (LOVENOX) injection  40 mg Subcutaneous Q24H  . famotidine  10 mg Oral BID  . multivitamin with minerals  1 tablet Oral Daily  . sodium chloride  3 mL Intravenous Q12H  . vancomycin  1,750 mg Intravenous Q48H      LOS: 2 days   RAI,RIPUDEEP M.D. [facility] 01/30/2013, 10:36 AM Pager: [phone]  If 7PM-7AM, please contact night-coverage [email or web address] Password [email or web address]

## 2013-01-30 NOTE — Consult Note (Signed)
WOC consult Note Reason for Consult: eval LE wounds. Pt with extensive venous stasis ulcerations of her bilateral LE. The left leg is essentially covered the entire calf with foul smelling, exudative venous ulcerations. The right LE has two large ulcerations pretibial and posterior calf, with scattered smaller lesions over the LE. Pt reports she has been treating the areas with castor oil. Wound type: extensive venous ulcerations of the bilateral LE. Measurement: the entire LLE is cover with ulcerations that are circumferential, the right LE has pretibial area 9cm x 8cm x 0.2cm and medial 2.0cm x 2.0cm, posterior 6cm x 5cm x 0.2cm   Wound bed:the wounds are essentially clean and oozing blood Drainage (amount, consistency, odor) green yellow exudate with odor, consistent with typical venous drainage. Dressing procedure/placement/frequency: silver hydrofiber to the most extensive areas for exudate control and bioburden tx.Kerlix and ACE, will monitor status on Wednesday this week. Do not feel compression wraps can be applied now due to the level of exudate and need to monitor the wounds, will see Wednesday if the are improved enough to place in Unna's boots possible.   Pt will need follow up with the wound care center these wounds are quite significant now and will need long term follow up with compression and topical care.     WOC will follow up on Wednesday for wound assessment Connie Miranda Connie Miranda, [place] [phone]

## 2013-01-30 NOTE — Progress Notes (Signed)
Progress Note  01/30/13 1625  PT Visit Information  Last PT Received On 01/30/13  Assistance Needed +2  PT Time Calculation  PT Start Time 1605  PT Stop Time 1623  PT Time Calculation (min) 18 min  Subjective Data  Subjective arms are sticking in me  Precautions  Precautions Fall  Cognition  Overall Cognitive Status Appears within functional limits for tasks assessed/performed  Arousal/Alertness Lethargic  Orientation Level Appears intact for tasks assessed  Behavior During Session [facility] for tasks performed  Bed Mobility  Bed Mobility Sit to Supine;Scooting to HOB  Sit to Supine 1: +2 Total assist  Sit to Supine: Patient Percentage 30%  Scooting to HOB 1: +1 Total assist  Details for Bed Mobility Assistance vc's for technique and significant truncal and LE assist  Transfers  Transfers Sit to Stand;Stand to Sit;Squat Pivot Transfers  Sit to Stand 1: +2 Total assist  Sit to Stand: Patient Percentage 20%  Stand to Sit 1: +2 Total assist;With upper extremity assist;To chair/3-in-1  Stand to Sit: Patient Percentage 20%  Squat Pivot Transfers 1: +2 Total assist;From elevated surface  Squat Pivot Transfers: Patient Percentage 20%  Details for Transfer Assistance On retun needed the assist of a sheet for incr leverage +2 to +3 assist  Ambulation/Gait  Ambulation/Gait Assistance Not tested (comment)  Stairs No  Balance  Balance Assessed No  PT - End of Session  Activity Tolerance Patient limited by fatigue;Patient limited by pain  Patient left in bed;with call bell/phone within reach;with nursing in room  Nurse Communication Mobility status;Need for lift equipment  PT - Assessment/Plan  Comments on Treatment Session Pt will need sara plus to begin working on standing.  PT Plan Discharge plan remains appropriate;Frequency remains appropriate  PT Frequency Min 3X/week  Follow Up Recommendations SNF  PT equipment Other (comment)  Acute Rehab PT Goals  PT Goal Formulation With  patient  Time For Goal Achievement 02/13/13  Potential to Achieve Goals Fair  PT General Charges  $$ ACUTE PT VISIT 1 Procedure  PT Treatments  $Therapeutic Activity 8-22 mins   01/30/2013  [place] Bing, PT [phone] (pager)

## 2013-01-30 NOTE — Clinical Social Work Psychosocial (Signed)
Clinical Social Work Department BRIEF PSYCHOSOCIAL ASSESSMENT 01/30/2013  Patient:  Connie Miranda, Connie Miranda     Account Number:  [id number]     Admit date:  01/28/2013  Clinical Social Worker:  Oswaldo Done  Date/Time:  01/30/2013 01:30 PM  Referred by:  RN  Date Referred:  01/29/2013 Referred for  Advanced Directives   Other Referral:   Interview type:  Patient Other interview type:    PSYCHOSOCIAL DATA Living Status:  ALONE Admitted from facility:   Level of care:   Primary support name:  Connie Miranda Primary support relationship to patient:  FRIEND Degree of support available:   Adequate    CURRENT CONCERNS Current Concerns  Post-Acute Placement   Other Concerns:    SOCIAL WORK ASSESSMENT / PLAN CSW consulted regarding Advanced Directives. Provided information to patient. Patient stated she can fill these out on her own. Patient then asked about possible SNF placement. Provided patient with a list of SNFs in [facility]. Pt. seemed slightly confused. Unsure which facility has been recommended to her in the past. Stated she wanted to talk to "Burna Mortimer, my social worker" for advice on where to go. Weekday CSW will follow up with patient pending PT Evaluation for possible SNF placement.   Assessment/plan status:  Information/Referral to [facility] Other assessment/ plan:   Information/referral to community resources:   Advanced Directives  SNF list    PATIENT'S/FAMILY'S RESPONSE TO PLAN OF CARE: Patient thanked CSW for information on Advanced Directives and list of SNFs.        Ricke Hey, [place] [phone] (weekend)

## 2013-01-30 NOTE — Progress Notes (Signed)
Pt had missed beat of 1.25 seconds. Pt is asymptomatic and vital signs are:  Filed Vitals:   01/29/13 2137  BP: 123/42  Pulse: 93  Temp: 100 F (37.8 C)  Resp: 18   MD made aware. Daphane Shepherd returned call. Per MD, "No new orders at this time, just monitor her." Will continue to monitor. Gilman Schmidt

## 2013-01-30 NOTE — Progress Notes (Signed)
Pt had missed beat of 1.25 seconds. Pt is asymptomatic. Vitals  Filed Vitals:   01/29/13 2137  BP: 123/42  Pulse: 93  Temp: 100 F (37.8 C)  Resp: 18   MD notified. Awaiting for call back

## 2013-01-30 NOTE — Evaluation (Signed)
Physical Therapy Evaluation Patient Details Name: Connie Miranda MRN: [id number] DOB: [birth year] Today's Date: 01/30/2013 Time: 4098-11914 PT Time Calculation (min): 33 min  PT Assessment / Plan / Recommendation Clinical Impression  pt admitted with ARF and hyperkal.  On eval, pt is profoundly weak and need significant assist for mobility/  Pt can benefit from PT to maximize function and decr burden of care.  Will Need SNF    PT Assessment       Follow Up Recommendations  SNF    Does the patient have the potential to tolerate intense rehabilitation      Barriers to Discharge        Equipment Recommendations  Other (comment)    Recommendations for Other Services     Frequency Min 3X/week    Precautions / Restrictions Precautions Precautions: Fall   Pertinent Vitals/Pain Not rated, but asking to remove dressing due to pain      Mobility  Bed Mobility Bed Mobility: Sit to Supine;Scooting to HOB Supine to Sit: 2: Max assist Sitting - Scoot to Edge of Bed: 2: Max assist Sit to Supine: 1: +2 Total assist Sit to Supine: Patient Percentage: 30% Scooting to HOB: 1: +1 Total assist Details for Bed Mobility Assistance: vc's for technique and significant truncal and LE assist Transfers Transfers: Sit to Stand;Stand to Sit;Squat Pivot Transfers Sit to Stand: 1: +2 Total assist Sit to Stand: Patient Percentage: 20% Stand to Sit: 1: +2 Total assist;With upper extremity assist;To chair/3-in-1 Stand to Sit: Patient Percentage: 20% Squat Pivot Transfers: 1: +2 Total assist;From elevated surface Squat Pivot Transfers: Patient Percentage: 20% Transfer via Lift Equipment:  (tried after sit to stand so difficult, but needs [place] steady) Details for Transfer Assistance: On retun needed the assist of a sheet for incr leverage +2 to +3 assist Ambulation/Gait Ambulation/Gait Assistance: Not tested (comment) Stairs: No    Shoulder Instructions     Exercises     PT Diagnosis:    PT  Problem List:   PT Treatment Interventions:     PT Goals Acute Rehab PT Goals PT Goal Formulation: With patient Time For Goal Achievement: 02/13/13 Potential to Achieve Goals: Fair Pt will go Supine/Side to Sit: with mod assist PT Goal: Supine/Side to Sit - Progress: Goal set today Pt will go Sit to Supine/Side: with mod assist PT Goal: Sit to Supine/Side - Progress: Goal set today Pt will go Sit to Stand: with max assist;from elevated surface PT Goal: Sit to Stand - Progress: Goal set today Pt will go Stand to Sit: with max assist;with upper extremity assist PT Goal: Stand to Sit - Progress: Goal set today Pt will Transfer Bed to Chair/Chair to Bed: with +2 total assist;Other (comment) (pt =40%) PT Transfer Goal: Bed to Chair/Chair to Bed - Progress: Goal set today  Visit Information  Last PT Received On: 01/30/13 Assistance Needed: +2    Subjective Data  Subjective: arms are sticking in me Patient Stated Goal: I want to go home   Prior Functioning  Home Living Lives With: Alone Available Help at Discharge: Friend(s) Type of Home: House Home Access: Stairs to enter [employer] of Steps: 4 Entrance Stairs-Rails: Left;Right Home Layout: One level Bathroom Shower/Tub: Health visitor: Standard Home Adaptive Equipment: Bedside commode/3-in-1;Grab bars in shower;Shower chair with back;Straight cane;Walker - rolling;Wheelchair - manual Prior Function Level of Independence: Independent with assistive device(s) (per pt, but difficult to believe that she was safe) Able to Take Stairs?: Yes (pt reported it  was very difficult) Communication Communication: No difficulties    Cognition  Overall Cognitive Status: Appears within functional limits for tasks assessed/performed Arousal/Alertness: Lethargic Orientation Level: Appears intact for tasks assessed Behavior During Session: [facility] for tasks performed    Extremity/Trunk Assessment Right Upper Extremity  Assessment RUE ROM/Strength/Tone: Deficits;WFL for tasks assessed RUE ROM/Strength/Tone Deficits: grossly weak at <=4/5 Left Upper Extremity Assessment LUE ROM/Strength/Tone: [facility] for tasks assessed;Deficits LUE ROM/Strength/Tone Deficits: grossly weak at <=4/5 Right Lower Extremity Assessment RLE ROM/Strength/Tone: Unable to fully assess;Due to pain;Deficits RLE ROM/Strength/Tone Deficits: less than 3/5, pt unable to attain stand and difficult to clear her battock off bed or BSC Left Lower Extremity Assessment LLE ROM/Strength/Tone: Deficits LLE ROM/Strength/Tone Deficits: see R Le   Balance Balance Balance Assessed: No Static Sitting Balance Static Sitting - Balance Support: Feet supported;Right upper extremity supported;Left upper extremity supported Static Sitting - Level of Assistance: 5: Stand by assistance Static Sitting - Comment/# of Minutes: 5 min on bed prepping for transfer and15+ min on the BSC  End of Session PT - End of Session Activity Tolerance: Patient limited by fatigue;Patient limited by pain Patient left: in bed;with call bell/phone within reach;with nursing in room Nurse Communication: Mobility status;Need for lift equipment  GP     [name], Eliseo Gum 01/30/2013, 4:34 PM  01/30/2013  [place] Bing, PT [phone] (pager)

## 2013-01-31 DIAGNOSIS — I890 Lymphedema, not elsewhere classified: Secondary | ICD-10-CM

## 2013-01-31 LAB — BASIC METABOLIC PANEL
BUN: 43 mg/dL — ABNORMAL HIGH (ref 6–23)
CO2: 26 mEq/L (ref 19–32)
Chloride: 105 mEq/L (ref 96–112)
Creatinine, Ser: 1.53 mg/dL — ABNORMAL HIGH (ref 0.50–1.10)
Potassium: 3.7 mEq/L (ref 3.5–5.1)

## 2013-01-31 MED ORDER — VANCOMYCIN HCL 10 G IV SOLR
1750.0000 mg | INTRAVENOUS | Status: DC
  Administered 2013-01-31: 1750 mg via INTRAVENOUS
  Filled 2013-01-31 (×2): qty 1750

## 2013-01-31 MED ORDER — ENOXAPARIN SODIUM 60 MG/0.6ML ~~LOC~~ SOLN
60.0000 mg | SUBCUTANEOUS | Status: DC
  Administered 2013-01-31: 60 mg via SUBCUTANEOUS
  Filled 2013-01-31 (×2): qty 0.6

## 2013-01-31 NOTE — Progress Notes (Signed)
Patient ID: Connie Miranda  female  ZDG:[id number]    DOB: November 29, 1933    DOA: 01/28/2013  PCP: [name], MD  Assessment/Plan: Principal problem; Acute renal failure: Most likely caused by multiple antihypertensives (Tekturna, Benicar, HCTZ, Lasix) in the setting of poor appetite, nausea dehydration, improving, Cr 1.5  -Continue to hold all above antihypertensives - renal ultrasound was obtained and was negative for any obstruction/hydronephrosis  - DC IVF   Active Problems:  Hyperkalemia: Resolved Likely exacerbated by potassium replacement, Tekturna  - Hold potassium replacement, Tekturna   Hypertension: Currently stable, continue Norvasc, PRN hydralazine   History of Stroke: Continue Plavix   Lymphedema with Cellulitis  - Wound care consult appreciated, on vancomycin IV  - Will need aggressive wound care, wraps, elevate lower extremities  Fall: Patient actually denies having mechanical fall.  - Knee Xray (complaining of right knee pain) showed arthritis, no fracture or dislocation - PT rec'd SNF   Nausea: Resolved - KUB showed no acute finding, tolerating solids, cont pepcid, Zofran PRN  DVT Prophylaxis:  Code Status:  Disposition: Patient lives at home, PT evaluation rec'd SNF   Subjective:  Feels better, no specific complaints, wants PT evaluation again  Objective: Weight change:   Intake/Output Summary (Last 24 hours) at 01/31/13 1429 Last data filed at 01/31/13 1100  Gross per 24 hour  Intake    120 ml  Output      0 ml  Net    120 ml   Blood pressure 111/51, pulse 87, temperature 99.1 F (37.3 C), temperature source Oral, resp. rate 18, height 5\' 4"  (1.626 m), weight 122.6 kg (270 lb 4.5 oz), SpO2 93.00%.  Physical Exam: General: Alert and awake, oriented x3, NAD CVS: S1-S2 clear, no murmur rubs or gallops Chest: CTAB Abdomen: soft nontender, nondistended, NBS Extremities:b/l wraps  Lab Results: Basic Metabolic Panel:  Lab 01/31/13 9518  01/30/13 0652  NA 137 138  K 3.7 3.6  CL 105 104  CO2 26 27  GLUCOSE 104* 94  BUN 43* 58*  CREATININE 1.53* 1.78*  CALCIUM 8.5 8.7  MG -- --  PHOS -- --   CBC:  Lab 01/29/13 0530 01/28/13 2113  WBC 10.9* 12.1*  NEUTROABS -- --  HGB 9.9* 10.0*  HCT 30.6* 30.8*  MCV 94.2 93.9  PLT 251 241    Studies/Results: Dg Chest 2 View  01/28/2013  *RADIOLOGY REPORT*  Clinical Data: Diarrhea, cellulitis, post fall  CHEST - 2 VIEW  Comparison: 10/12/2012; CT abdomen pelvis - 10/11/2012  Findings:  Grossly unchanged enlarged cardiac silhouette and mediastinal contours with mild prominence of bilateral pulmonary hila.  There is grossly unchanged mild diffuse thickening of the pulmonary interstitium.  No focal airspace opacities.  No pleural effusion or pneumothorax.  No definite evidence of pulmonary edema.  Grossly unchanged bones including severe bilateral glenohumeral joint degenerative changes, incompletely evaluated.  IMPRESSION: 1.  Grossly unchanged borderline enlarged cardiac silhouette with prominence of the bilateral pulmonary hila, nonspecific but may be seen in the setting of pulmonary arterial hypertension, although note, hilar adenopathy may have a similar appearance.  Further evaluation with chest CT and/or cardiac echo may be performed as clinically indicated. 2.  Grossly unchanged mild diffuse bronchitic change.   Original Report Authenticated By: Tacey Ruiz, MD    Dg Abd 1 View  01/28/2013  *RADIOLOGY REPORT*  Clinical Data: Nausea.  ABDOMEN - 1 VIEW  Comparison: None.  Findings: No evidence of dilated bowel loops.  Stool noted in  the left colon which is nondilated.  No free air visualized.  IUD noted.  No radiopaque calculi identified.  IMPRESSION: No acute findings.   Original Report Authenticated By: Myles Rosenthal, M.D.    US Renal  01/29/2013  *RADIOLOGY REPORT*  Clinical Data: Acute renal failure  RENAL/URINARY TRACT ULTRASOUND COMPLETE  Comparison:  CT abdomen pelvis - 10/11/2012   Findings:  Right Kidney:  Normal cortical thickness, echogenicity and size, measuring 10.1 cm in length.  No focal renal lesions.  No echogenic renal stones.  No urinary obstruction.  Left Kidney:  Normal cortical thickness, echogenicity and size, measuring 10.3 cm in length.  No focal renal lesions.  No echogenic renal stones.  No urinary obstruction.  Bladder:  Normal given degree of distension  Incidental note is made of two discrete echogenic shadowing stones with an otherwise normal-appearing gallbladder.  IMPRESSION: 1.  Normal renal ultrasound.  Specifically, no evidence of urinary obstruction or sonographic evidence of medical renal disease. 2.  Cholelithiasis incidentally noted with an otherwise normal- appearing gallbladder.   Original Report Authenticated By: Tacey Ruiz, MD     Medications: Scheduled Meds:    . amLODipine  10 mg Oral Daily  . calcium-vitamin D  1 tablet Oral Daily  . clopidogrel  75 mg Oral Daily  . enoxaparin (LOVENOX) injection  60 mg Subcutaneous Q24H  . famotidine  10 mg Oral BID  . multivitamin with minerals  1 tablet Oral Daily  . sodium chloride  3 mL Intravenous Q12H  . vancomycin  1,750 mg Intravenous Q24H      LOS: 3 days   Tyeasha Ebbs M.D. [facility] 01/31/2013, 2:29 PM Pager: [phone]  If 7PM-7AM, please contact night-coverage [email or web address] Password [email or web address]

## 2013-01-31 NOTE — Progress Notes (Signed)
ANTIBIOTIC CONSULT NOTE - INITIAL  Pharmacy Consult for Vancomycin Indication: b/l lower leg cellulitis  Allergies  Allergen Reactions  . Asa (Aspirin) Other (See Comments)    Doctor told her not to take due to type anemia  . Codeine Other (See Comments)    Made her too sleepy  . Penicillins Itching and Swelling  . Phosphate Other (See Comments)    Anything containing phosphate 6. Unknown reaction    Patient Measurements: Height: 5\' 4"  (162.6 cm) Weight: 270 lb 4.5 oz (122.6 kg) IBW/kg (Calculated) : 54.7  Ht: 64 in Wt: 125 kg (stated) Adjusted Body Weight:   Vital Signs: Temp: 100.2 F (37.9 C) (02/03 0528) Temp src: Oral (02/03 0528) BP: 122/49 mmHg (02/03 0528) Pulse Rate: 89  (02/03 0528) Intake/Output from previous day: 02/02 0701 - 02/03 0700 In: 600 [P.O.:600] Out: -  Intake/Output from this shift:    Labs:  Basename 01/31/13 0558 01/30/13 0652 01/29/13 0530 01/28/13 2113 01/28/13 1157  WBC -- -- 10.9* 12.1* 14.6*  HGB -- -- 9.9* 10.0* 10.8*  PLT -- -- 251 241 278  LABCREA -- -- -- -- --  CREATININE 1.53* 1.78* 2.47* -- --   Estimated Creatinine Clearance: 39.8 ml/min (by C-G formula based on Cr of 1.53). No results found for this basename: VANCOTROUGH:2,VANCOPEAK:2,VANCORANDOM:2,GENTTROUGH:2,GENTPEAK:2,GENTRANDOM:2,TOBRATROUGH:2,TOBRAPEAK:2,TOBRARND:2,AMIKACINPEAK:2,AMIKACINTROU:2,AMIKACIN:2, in the last 72 hours   Microbiology: Recent Results (from the past 720 hour(s))  CULTURE, BLOOD (ROUTINE X 2)     Status: Normal (Preliminary result)   Collection Time   01/28/13  8:55 PM      Component Value Range Status Comment   Specimen Description BLOOD LEFT ARM   Final    Special Requests BOTTLES DRAWN AEROBIC ONLY 10CC   Final    Culture  Setup Time 01/29/2013 05:49   Final    Culture     Final    Value:        BLOOD CULTURE RECEIVED NO GROWTH TO DATE CULTURE WILL BE HELD FOR 5 DAYS BEFORE ISSUING A FINAL NEGATIVE REPORT   Report Status PENDING   Incomplete    CULTURE, BLOOD (ROUTINE X 2)     Status: Normal (Preliminary result)   Collection Time   01/28/13  9:05 PM      Component Value Range Status Comment   Specimen Description BLOOD LEFT HAND   Final    Special Requests BOTTLES DRAWN AEROBIC ONLY [facility]   Final    Culture  Setup Time 01/29/2013 05:48   Final    Culture     Final    Value:        BLOOD CULTURE RECEIVED NO GROWTH TO DATE CULTURE WILL BE HELD FOR 5 DAYS BEFORE ISSUING A FINAL NEGATIVE REPORT   Report Status PENDING   Incomplete   URINE CULTURE     Status: Normal   Collection Time   01/29/13 11:18 AM      Component Value Range Status Comment   Specimen Description URINE, CLEAN CATCH   Final    Special Requests NONE   Final    Culture  Setup Time 01/29/2013 19:22   Final    Colony Count >=100,000 COLONIES/ML   Final    Culture     Final    Value: Multiple bacterial morphotypes present, none predominant. Suggest appropriate recollection if clinically indicated.   Report Status 01/30/2013 FINAL   Final     Medical History: Past Medical History  Diagnosis Date  . Hypertension   . Stroke 09/2000  couldn't move her left foot which resolved  . Edema   . Pernicious anemia   . Glucose-6-phosphate dehydrogenase deficiency     possible diagnosis of G6PD per patient "phosphate 6 deficiency"  . Osteoarthritis     Medications:  See electronic med rec  Assessment: 79 y.o. female presents s/p fall from low wheelchair. She is concerned with swelling/weeping of fluid from b/l lower legs. Noted h/o chronic venous stasis changes. Receiving day #3 vancomycin for cellulits. Renal function continues to improve, will adjust vancomycin dosing. Has low grade fever today. Goal of Therapy:  Vancomycin trough level 10-15 mcg/ml  Plan:  Change vancomycin to 1750mg  IV q24 and continue to follow. Continue to monitor renal function and check vancomycin trough at steady state. Will adjust lovenox for obesity.  Verlene Mayer, PharmD,  BCPS Pager [phone] 01/31/2013,9:02 AM

## 2013-02-01 DIAGNOSIS — N19 Unspecified kidney failure: Secondary | ICD-10-CM

## 2013-02-01 LAB — BASIC METABOLIC PANEL
CO2: 28 mEq/L (ref 19–32)
Calcium: 8.9 mg/dL (ref 8.4–10.5)
Chloride: 105 mEq/L (ref 96–112)
Creatinine, Ser: 1.26 mg/dL — ABNORMAL HIGH (ref 0.50–1.10)
Glucose, Bld: 107 mg/dL — ABNORMAL HIGH (ref 70–99)

## 2013-02-01 MED ORDER — FAMOTIDINE 10 MG PO TABS: 10.0000 mg | ORAL_TABLET | Freq: Two times a day (BID) | ORAL | Status: DC

## 2013-02-01 MED ORDER — HYDROCODONE-ACETAMINOPHEN 5-500 MG PO TABS: 1.0000 | ORAL_TABLET | Freq: Two times a day (BID) | ORAL | Status: DC | PRN

## 2013-02-01 MED ORDER — DOXYCYCLINE HYCLATE 100 MG PO TABS: 100.0000 mg | ORAL_TABLET | Freq: Two times a day (BID) | ORAL | Status: DC

## 2013-02-01 NOTE — Consult Note (Signed)
WOC follow up Wound type: venous stasis bilateral LE.  Large ulceration of the right pretibial area 8cm x 9cm  With several small ulcerations of the medial and lateral right LE, these wounds are more clean today, less exudate and odor with the use of silver hydrofiber. The left LE has more extensive ulcerations that extend circumferentially, these still have moderate yellow-green exudate and a slight odor.  The wounds are not oozing as bad today and the edema has been reduced quite at bit with elevation and mild compression with ACE. Much less maceration of the periwound tissue today with better exudate control with the hydrofiber dressings.  Dressing procedure/placement/frequency: Continue silver hydrofiber to all the open wounds, cover with ABDs and kerlix and 4" ACE wraps.  Change every other day.   Case conference with the attending MD, DC planned today she is aware of the discharge wound care needs.  I have made the patient a follow up appt with the [facility] for Monday Feb. 10th, at 10:00am.  New silver hydrofiber dressings placed.  Discussed follow up in the wound care center for Monday with the pt.   Re consult if needed, will not follow at this time. Thanks  Kristan Votta [employer], CWOCN [phone])

## 2013-02-01 NOTE — Clinical Social Work Psychosocial (Signed)
Clinical Social Work Department BRIEF PSYCHOSOCIAL ASSESSMENT 02/01/2013  Patient:  Connie Miranda, Connie Miranda     Account Number:  [id number]     Admit date:  01/28/2013  Clinical Social Worker:  Delmer Islam  Date/Time:  02/01/2013 03:27 PM  Referred by:  Physician  Date Referred:  01/31/2013 Referred for  SNF Placement   Other Referral:   Interview type:  Patient Other interview type:   Carleene Cooper [phone]    PSYCHOSOCIAL DATA Living Status:  ALONE Admitted from facility:   Level of care:   Primary support name:  Carleene Cooper Primary support relationship to patient:  FAMILY Degree of support available:   Grace Bushy (Brother) and [employer] (Brother) show strong concern of patient's progress.    CURRENT CONCERNS Current Concerns  Post-Acute Placement   Other Concerns:    SOCIAL WORK ASSESSMENT / PLAN CSW intern introduced self and acknowledged the patient. Patient was alert and oriented. CSW intern discussed with the patient about discharge plans. Patient was well engaged in [place] with CSW intern. CSW intern informed the patient of the physicians recommendation for short term rehab. Patient is agreeable of SNF placement. CSW intern provided the patient with a list of SNF in [place]. Patient did not have any preferences at this time. CSW intern informed the patient that CSW will send out patients clinical information to the facilities in [facility]. CSW will then follow-up with patient about facility responses.    CSW followed up with the patient and  God-daughter Burna Mortimer) who was at the bedside. CSW intern provided the patient and god-daughter with facility responses. God- daughter chose [facility] for the patient. Patient was agreeable with [facility] for short term rehab. .   Assessment/plan status:  No Further Intervention Required Other assessment/ plan:   Information/referral to community resources:   Provided the patient with a list of skilled  nursing facilities in [place].    PATIENT'S/FAMILY'S RESPONSE TO PLAN OF CARE: Patient and patient's God-daughter is appreciative of the resources provided by CSW intern.     Fernande Boyden, Social Work Intern 02/01/2013

## 2013-02-01 NOTE — Discharge Summary (Signed)
Physician Discharge Summary  Patient ID: Connie Miranda MRN: [id number] DOB/AGE: 02-Jul-1933 79 y.o.  Admit date: 01/28/2013 Discharge date: 02/01/2013  Primary Care Physician:  [name], MD  Discharge Diagnoses:    . Acute renal failure- resolved  . Hyperkalemia resolved  . Lymphedema . Cellulitis extensive venous stasis ulcerations on bilateral lower extremities  . Hypertension . mechanical Fall . history of Stroke . Nausea- resolved  Consults: Wound care  Discharge Medications:   Medication List     As of 02/01/2013 12:30 PM    STOP taking these medications         aliskiren 300 MG tablet   Commonly known as: TEKTURNA      furosemide 40 MG tablet   Commonly known as: LASIX      olmesartan-hydrochlorothiazide 40-25 MG per tablet   Commonly known as: BENICAR HCT      potassium chloride 10 MEQ tablet   Commonly known as: K-DUR,KLOR-CON      TAKE these medications         amLODipine 10 MG tablet   Commonly known as: NORVASC   Take 10 mg by mouth daily.      BIOTIN PO   Take 1 tablet by mouth daily.      CALCIUM + D PO   Take 1 tablet by mouth daily.      clopidogrel 75 MG tablet   Commonly known as: PLAVIX   Take 75 mg by mouth daily.      cyclobenzaprine 10 MG tablet   Commonly known as: FLEXERIL   Take 10 mg by mouth 3 (three) times daily as needed. For muscle spasms      doxycycline 100 MG tablet   Commonly known as: VIBRA-TABS   Take 1 tablet (100 mg total) by mouth 2 (two) times daily. X 2 weeks      famotidine 10 MG tablet   Commonly known as: PEPCID   Take 1 tablet (10 mg total) by mouth 2 (two) times daily.      HYDROcodone-acetaminophen 5-500 MG per tablet   Commonly known as: VICODIN   Take 1 tablet by mouth 2 (two) times daily as needed for pain. For pain      multivitamin with minerals Tabs   Take 1 tablet by mouth daily.          Brief H and P: For complete details please refer to admission H and P, but in brief Patient  is a 79 year old female, from home, with history of hypertension on multiple antihypertensives, stroke without any residual deficits, history of anemia with possible G6PD deficiency and osteoarthritis presented with fall. Patient stated that she actually slid from her wheelchair and did not fall. She denied hitting her head. Patient was brought to the ER and her lab work showed acute renal insufficiency with creatinine of 3.7, BUN 97, leukocytosis with WBC 14.6, potassium of 6.1. Her baseline creatinine is normal at 1.0.  Patient also reported having nausea but no vomiting or diarrhea in the last 2 days.   Hospital Course:  Acute renal failure: Most likely caused by multiple antihypertensives (Tekturna, Benicar, HCTZ, Lasix) in the setting of poor appetite, nausea dehydration, improved, creatinine 1.2. Renal ultrasound was obtained and was negative for any obstruction or hydronephrosis. As patient's BP has been very stable during the hospitalization, I recommend to discontinue these antihypertensives and continue only Norvasc.  She was placed on gentle hydration during hospitalization, she is eating and drinking well without any issues.  Hyperkalemia: Resolved Likely exacerbated by potassium replacement, Tekturna. No need of potassium replacement   Hypertension: Currently stable, continue Norvasc only.   History of Stroke: Continue Plavix   Lymphedema with Cellulitis with extensive venous stasis ulceration of her bilateral lower extremities with foul-smelling and exudative ulcerations. Wound care consultation was obtained and patient was recommended aggressive wound care. She was placed on IV vancomycin and should continue oral doxycycline for at least 2 weeks and continue followup with wound care.  Fall: Patient actually denied having mechanical fall.  Knee Xray (complained of right knee pain) showed arthritis, no fracture or dislocation. Physical therapy recommended skilled nursing facility and  would benefit from wound care as well.   Nausea: Resolved  - KUB showed no acute finding, tolerating solids, cont pepcid    Day of Discharge BP 112/56  Pulse 90  Temp 99.2 F (37.3 C) (Oral)  Resp 17  Ht 5\' 4"  (1.626 m)  Wt 127.642 kg (281 lb 6.4 oz)  BMI 48.30 kg/m2  SpO2 97%  Physical Exam: General: Alert and awake oriented x3 not in any acute distress. HEENT: anicteric sclera, pupils reactive to light and accommodation CVS: S1-S2 clear no murmur rubs or gallops Chest: clear to auscultation bilaterally, no wheezing rales or rhonchi Abdomen: soft nontender, nondistended, normal bowel sounds, no organomegaly Extremities: Bilateral lower extremity dressings    The results of significant diagnostics from this hospitalization (including imaging, microbiology, ancillary and laboratory) are listed below for reference.    LAB RESULTS: Basic Metabolic Panel:  Lab 02/01/13 1610 01/31/13 0558  NA 138 137  K 4.0 3.7  CL 105 105  CO2 28 26  GLUCOSE 107* 104*  BUN 28* 43*  CREATININE 1.26* 1.53*  CALCIUM 8.9 8.5  MG -- --  PHOS -- --   CBC:  Lab 01/29/13 0530 01/28/13 2113  WBC 10.9* 12.1*  NEUTROABS -- --  HGB 9.9* 10.0*  HCT 30.6* 30.8*  MCV 94.2 --  PLT 251 241    Significant Diagnostic Studies:  Dg Chest 2 View  01/28/2013  *RADIOLOGY REPORT*  Clinical Data: Diarrhea, cellulitis, post fall  CHEST - 2 VIEW  Comparison: 10/12/2012; CT abdomen pelvis - 10/11/2012  Findings:  Grossly unchanged enlarged cardiac silhouette and mediastinal contours with mild prominence of bilateral pulmonary hila.  There is grossly unchanged mild diffuse thickening of the pulmonary interstitium.  No focal airspace opacities.  No pleural effusion or pneumothorax.  No definite evidence of pulmonary edema.  Grossly unchanged bones including severe bilateral glenohumeral joint degenerative changes, incompletely evaluated.  IMPRESSION: 1.  Grossly unchanged borderline enlarged cardiac silhouette  with prominence of the bilateral pulmonary hila, nonspecific but may be seen in the setting of pulmonary arterial hypertension, although note, hilar adenopathy may have a similar appearance.  Further evaluation with chest CT and/or cardiac echo may be performed as clinically indicated. 2.  Grossly unchanged mild diffuse bronchitic change.   Original Report Authenticated By: Tacey Ruiz, MD    Dg Abd 1 View  01/28/2013  *RADIOLOGY REPORT*  Clinical Data: Nausea.  ABDOMEN - 1 VIEW  Comparison: None.  Findings: No evidence of dilated bowel loops.  Stool noted in the left colon which is nondilated.  No free air visualized.  IUD noted.  No radiopaque calculi identified.  IMPRESSION: No acute findings.   Original Report Authenticated By: Myles Rosenthal, M.D.      Disposition and Follow-up: Discharge Orders    Future Orders Please Complete By Expires   Diet -  low sodium heart healthy      Increase activity slowly      Discharge wound care:      Comments:   Silver hydrofiber dressings to open wounds of the bilateral LE, then kerlix and ACE every other day       DISPOSITION: Skilled nursing facility DIET: Heart healthy diet  ACTIVITY: As tolerated   Wound Care: Dressing procedure/placement/frequency: Continue silver hydrofiber to all the open wounds, cover with ABDs and kerlix and 4" ACE wraps. Change every other day. Follow up appt with the [facility] for Monday Feb. 10th, at 10:00am.       DISCHARGE FOLLOW-UP Follow-up Information    Follow up with [name], MD. Schedule an appointment as soon as possible for a visit in 10 days. (for hospital follow-up)    Contact information:   [address] [place] [address] [phone]       On 02/07/2013 to follow up. (at 10:00AM)    Contact information:   [facility] wound care center         Time spent on Discharge: 39 minutes  Signed:   Roni Friberg M.D. [facility] 02/01/2013, 12:30 PM Pager:  [phone]

## 2013-02-01 NOTE — Progress Notes (Signed)
Physical Therapy Treatment Patient Details Name: Connie Miranda MRN: [id number] DOB: 02-Jul-1933 Today's Date: 02/01/2013 Time: 9562-1308 PT Time Calculation (min): 34 min  PT Assessment / Plan / Recommendation Comments on Treatment Session  Pt making slow progress.  Able to stand with Huntley Dec plus.    Follow Up Recommendations  SNF     Does the patient have the potential to tolerate intense rehabilitation     Barriers to Discharge        Equipment Recommendations       Recommendations for Other Services    Frequency Min 2X/week   Plan Discharge plan remains appropriate;Frequency needs to be updated    Precautions / Restrictions Precautions Precautions: Fall   Pertinent Vitals/Pain Pain in legs - repositioned.    Mobility  Bed Mobility Bed Mobility: Rolling Left;Rolling Right Rolling Right: 3: Mod assist;With rail Rolling Left: 3: Mod assist;With rail Supine to Sit: 1: +2 Total assist Supine to Sit: Patient Percentage: 30% Sitting - Scoot to Edge of Bed: 1: +2 Total assist Sitting - Scoot to Edge of Bed: Patient Percentage: 40% Sit to Supine: 1: +2 Total assist Sit to Supine: Patient Percentage: 20% Scooting to HOB: 1: +2 Total assist Scooting to [facility]: Patient Percentage: 0% Transfers Sit to Stand: 1: +2 Total assist Sit to Stand: Patient Percentage: 40% Stand to Sit: 1: +2 Total assist Stand to Sit: Patient Percentage: 40% Transfer via Lift Equipment: Hydrographic surveyor Details for Transfer Assistance: Used Huntley Dec plus for stand    Exercises     PT Diagnosis:    PT Problem List:   PT Treatment Interventions:     PT Goals Acute Rehab PT Goals PT Goal: Supine/Side to Sit - Progress: Progressing toward goal PT Goal: Sit to Supine/Side - Progress: Progressing toward goal PT Goal: Sit to Stand - Progress: Progressing toward goal PT Goal: Stand to Sit - Progress: Progressing toward goal  Visit Information  Last PT Received On: 02/01/13 Assistance Needed: +2     Subjective Data  Subjective: Pt willing to try standing with Huntley Dec Plus.   Cognition  Cognition Overall Cognitive Status: Appears within functional limits for tasks assessed/performed Arousal/Alertness: Awake/alert Orientation Level: Appears intact for tasks assessed Behavior During Session: [facility] for tasks performed    Balance  Static Standing Balance Static Standing - Balance Support: Bilateral upper extremity supported Static Standing - Level of Assistance: 1: +2 Total assist Static Standing - Comment/# of Minutes: Stood with Huntley Dec Plus x 30 sec  End of Session PT - End of Session Activity Tolerance: Patient limited by fatigue Patient left: with call bell/phone within reach;in bed Nurse Communication: Need for lift equipment   GP     Ori Kreiter 02/01/2013, 4:17 PM  [facility] PT [phone]

## 2013-02-01 NOTE — Progress Notes (Signed)
Utilization review completed.  

## 2013-02-01 NOTE — Progress Notes (Signed)
Pt discharged to facility via ambulance. Pt is hemodynamically stable. Report given to transporters.  .    

## 2013-02-01 NOTE — Progress Notes (Signed)
Report given to Olu, LPN at [facility].

## 2013-02-01 NOTE — Clinical Social Work Placement (Signed)
Clinical Social Work Department CLINICAL SOCIAL WORK PLACEMENT NOTE 02/01/2013  Patient:  Connie Miranda, Connie Miranda  Account Number:  [id number] Admit date:  01/28/2013  Clinical Social Worker:  Genelle Bal, LCSW  Date/time:  02/01/2013 03:50 PM  Clinical Social Work is seeking post-discharge placement for this patient at the following level of care:   SKILLED NURSING   (*CSW will update this form in Epic as items are completed)   02/01/2013  Patient/family provided with [facility] Department of Clinical Social Work's list of facilities offering this level of care within the geographic area requested by the patient (or if unable, by the patient's family).  02/01/2013  Patient/family informed of their freedom to choose among providers that offer the needed level of care, that participate in Medicare, Medicaid or managed care program needed by the patient, have an available bed and are willing to accept the patient.    Patient/family informed of [facility]' ownership interest in [facility], as well as of the fact that they are under no obligation to receive care at this facility.  PASARR submitted to EDS: Patient has prior PASARR Number  PASARR number received from EDS on   FL2 transmitted to all facilities in geographic area requested by pt/family on   FL2 transmitted to all facilities within larger geographic area on   Patient informed that his/her managed care company has contracts with or will negotiate with  certain facilities, including the following:     Patient/family informed of bed offers received:  02/01/2013 Patient chooses bed at [facility] Physician recommends and patient chooses bed at    Patient to be transferred to [facility] on  02/01/2013 Patient to be transferred to facility by [facility]  The following physician request were entered in Epic:   Additional Comments:  Fernande Boyden, Social Work Intern 02/01/2013

## 2013-02-04 LAB — CULTURE, BLOOD (ROUTINE X 2): Culture: NO GROWTH

## 2013-02-07 ENCOUNTER — Encounter (HOSPITAL_BASED_OUTPATIENT_CLINIC_OR_DEPARTMENT_OTHER): Payer: Medicare Other | Attending: Plastic Surgery

## 2013-03-22 ENCOUNTER — Non-Acute Institutional Stay (SKILLED_NURSING_FACILITY): Payer: Medicare Other | Admitting: Nurse Practitioner

## 2013-03-22 ENCOUNTER — Encounter: Payer: Self-pay | Admitting: Nurse Practitioner

## 2013-03-22 DIAGNOSIS — I890 Lymphedema, not elsewhere classified: Secondary | ICD-10-CM

## 2013-03-22 DIAGNOSIS — D510 Vitamin B12 deficiency anemia due to intrinsic factor deficiency: Secondary | ICD-10-CM

## 2013-03-22 DIAGNOSIS — R609 Edema, unspecified: Secondary | ICD-10-CM

## 2013-03-22 DIAGNOSIS — M1990 Unspecified osteoarthritis, unspecified site: Secondary | ICD-10-CM

## 2013-03-22 DIAGNOSIS — I10 Essential (primary) hypertension: Secondary | ICD-10-CM

## 2013-03-22 DIAGNOSIS — L0291 Cutaneous abscess, unspecified: Secondary | ICD-10-CM

## 2013-03-22 DIAGNOSIS — N179 Acute kidney failure, unspecified: Secondary | ICD-10-CM

## 2013-03-22 DIAGNOSIS — L0390 Cellulitis, unspecified: Secondary | ICD-10-CM

## 2013-03-22 DIAGNOSIS — R110 Nausea: Secondary | ICD-10-CM

## 2013-03-22 DIAGNOSIS — W19XXXS Unspecified fall, sequela: Secondary | ICD-10-CM

## 2013-03-22 NOTE — Progress Notes (Signed)
Subjective:    Patient ID: Connie Miranda, female    DOB: [birth year], 79 y.o.   MRN: [id number]  HPI  79 year old female with history of hypertension on multiple antihypertensives, stroke without any residual deficits, history of anemia with possible G6PD deficiency and osteoarthritis presented to the ED with fall. She denied hitting her head. She was brought to the ER and her lab work showed acute renal insufficiency with creatinine of 3.7, BUN 97, leukocytosis with WBC 14.6, potassium of 6.1. Her baseline creatinine is normal at 1.0. SHe was then admitted to the hospital for treatment. Her  Acute renal failure was most likely caused by multiple antihypertensives (Tekturna, Benicar, HCTZ, Lasix) in the setting of poor appetite, nausea dehydration. Renal ultrasound was obtained and was negative for any obstruction or hydronephrosis. This improved before discharge and pts BP was very stable during the hospitalization,antihypertensives were discont except for only Norvasc. She was placed on gentle hydration during hospitalization, she is eating and drinking well without any issues.  History of Stroke currently on Plavix  Lymphedema with Cellulitis during hospitalization with extensive venous stasis ulceration of her bilateral lower extremities with foul-smelling and exudative ulcerations. Wound care consultation was obtained and patient was recommended aggressive wound care. She was placed on IV vancomycin and should continue oral doxycycline for at least 2 weeks and continue followup with wound care. This has currently improved greatly  Fall: Patient actually denied having mechanical fall. Knee Xray (complained of right knee pain) showed arthritis, no fracture or dislocation. Physical therapy recommended skilled nursing facility and would benefit from wound care as well. However during stay at [facility] she has been refusing therapy. She is now been discharged from therapy and would like to go home.    Review of Systems  Constitutional: Negative for activity change.  HENT: Negative.   Eyes: Negative.   Respiratory: Negative for cough and shortness of breath.   Cardiovascular: Negative for chest pain and palpitations. Leg swelling: improved- still with lympedema.  Gastrointestinal: Positive for diarrhea (diarrhea started last night- reports improved today). Negative for constipation and abdominal distention.  Genitourinary:       Urge incontinence-  Wears depends--reports she is aware of when she needs to urinate however due to pain in knee and instability does not make it to the bathroom in time   Musculoskeletal: Positive for back pain, arthralgias and gait problem.  Skin: Negative.   Neurological: Positive for weakness.  Psychiatric/Behavioral: Negative.        Objective:   Physical Exam  Constitutional: She is oriented to person, place, and time. She appears well-developed and well-nourished. No distress.  HENT:  Head: Normocephalic and atraumatic.  Eyes: Conjunctivae are normal. Pupils are equal, round, and reactive to light.  Neck: Normal range of motion. Neck supple.  Cardiovascular: Normal rate and regular rhythm.   Pulmonary/Chest: Effort normal and breath sounds normal.  Abdominal: Soft. Bowel sounds are normal.  Musculoskeletal: She exhibits edema.  Unsteady gait- needs lot of assistance to ambulate from bed to wheelchair with walker  Blister to right lower extremity   Neurological: She is alert and oriented to person, place, and time.  Skin: Skin is warm and dry. She is not diaphoretic.  Psychiatric: She has a normal mood and affect. Her behavior is normal. Thought content normal.          Assessment & Plan:  Pt is currently unsafe for discharge home alone due to unsteady gait. Pt needs more PT/OT however she is  noncompliant with therapy Pt reports she is trying to get 24 hour care but has been unsuccessful in doing this at this time. Will recommend she  stays here for therapy if she can be compliant or for LTC Pt has been educated on why I do not feel like her leaving and being home alone is unsafe and she agrees.  At this time will follow up on anemia and kidney function CBC and BMP  To cont wound care by treatment nurse

## 2013-03-24 ENCOUNTER — Non-Acute Institutional Stay (SKILLED_NURSING_FACILITY): Payer: Medicare Other | Admitting: Nurse Practitioner

## 2013-03-24 ENCOUNTER — Encounter: Payer: Self-pay | Admitting: Nurse Practitioner

## 2013-03-24 DIAGNOSIS — I639 Cerebral infarction, unspecified: Secondary | ICD-10-CM

## 2013-03-24 DIAGNOSIS — D649 Anemia, unspecified: Secondary | ICD-10-CM

## 2013-03-24 DIAGNOSIS — R609 Edema, unspecified: Secondary | ICD-10-CM

## 2013-03-24 DIAGNOSIS — L03116 Cellulitis of left lower limb: Secondary | ICD-10-CM

## 2013-03-24 DIAGNOSIS — W19XXXS Unspecified fall, sequela: Secondary | ICD-10-CM

## 2013-03-24 DIAGNOSIS — I10 Essential (primary) hypertension: Secondary | ICD-10-CM

## 2013-03-24 DIAGNOSIS — L03119 Cellulitis of unspecified part of limb: Secondary | ICD-10-CM

## 2013-03-24 MED ORDER — CYCLOBENZAPRINE HCL 10 MG PO TABS: 10.0000 mg | ORAL_TABLET | Freq: Three times a day (TID) | ORAL | Status: DC | PRN

## 2013-03-24 MED ORDER — CYANOCOBALAMIN 1000 MCG/ML IJ SOLN: 1000.0000 ug | INTRAMUSCULAR | Status: DC

## 2013-03-24 MED ORDER — AQUAPHOR EX OINT: 1.0000 "application " | TOPICAL_OINTMENT | Freq: Every day | CUTANEOUS | Status: DC

## 2013-03-24 MED ORDER — ADULT MULTIVITAMIN W/MINERALS CH: 1.0000 | ORAL_TABLET | Freq: Every day | ORAL | Status: AC

## 2013-03-24 MED ORDER — AMLODIPINE BESYLATE 10 MG PO TABS: 10.0000 mg | ORAL_TABLET | Freq: Every day | ORAL | Status: DC

## 2013-03-24 MED ORDER — HYDROCODONE-ACETAMINOPHEN 5-325 MG PO TABS: 1.0000 | ORAL_TABLET | Freq: Two times a day (BID) | ORAL | Status: DC | PRN

## 2013-03-24 MED ORDER — BIOTIN 1000 MCG PO TABS: 1000.0000 ug | ORAL_TABLET | Freq: Every day | ORAL | Status: DC

## 2013-03-24 MED ORDER — CLOPIDOGREL BISULFATE 75 MG PO TABS: 75.0000 mg | ORAL_TABLET | Freq: Every day | ORAL | Status: DC

## 2013-03-24 NOTE — Assessment & Plan Note (Signed)
improved

## 2013-03-24 NOTE — Assessment & Plan Note (Signed)
Stable- cont b12 injections monthly

## 2013-03-24 NOTE — Assessment & Plan Note (Signed)
Stable on current medications 

## 2013-03-24 NOTE — Assessment & Plan Note (Signed)
Stable- cont current medications 

## 2013-03-24 NOTE — Assessment & Plan Note (Signed)
History of fall- at high risk for additional falls- does not wish to stay here at [facility] even though this has been recommended pt confirmed with SW that she does have 24 hour care. Pt will need PT/OT per home health. Also will get Home health to evaluate left lower leg and SW  Per home health

## 2013-03-24 NOTE — Assessment & Plan Note (Signed)
Improved with completion of antibiotics and wound care

## 2013-03-24 NOTE — Progress Notes (Signed)
Patient ID: Connie Miranda, female   DOB: 05/12/1933, 79 y.o.   MRN: [id number]  Chief Complaint: AV: evaluation for discharge   HPI:  79 year old female with history of hypertension on multiple antihypertensives, stroke without any residual deficits, history of anemia with possible G6PD deficiency and osteoarthritis presented to the ED with fall. Also with ymphedema with Cellulitis during hospitalization with extensive venous stasis ulceration of her bilateral lower extremities with foul-smelling and exudative ulcerations. Wound care consultation was obtained and patient was recommended aggressive wound care. She was placed on IV vancomycin and should continue oral doxycycline for at least 2 weeks and continue followup with wound care. This has currently improved greatly  Fall: Patient actually denied having mechanical fall. Knee Xray (complained of right knee pain) showed arthritis, no fracture or dislocation. Physical therapy recommended skilled nursing facility and would benefit from wound care as well. However she has not been progressing with therapy and would like to be discharge home with 24 hour caregivers. Pt will need home health upon discharge   Review of Systems:  Review of Systems  Constitutional: Negative for activity change.  HENT: Negative.   Eyes: Negative.   Respiratory: Negative for cough and shortness of breath.   Cardiovascular: Negative for chest pain and palpitations. Leg swelling: improved- still with lympedema.  Gastrointestinal: negative denies acid reflux or N/V/D Negative for constipation and abdominal distention.  Genitourinary:       Urge incontinence-  Wears depends--reports she is aware of when she needs to urinate however due to pain in knee and instability does not make it to the bathroom in time   Musculoskeletal: Positive for back pain, arthralgias and gait problem.  Skin: Negative.   Neurological: Positive for weakness.  Psychiatric/Behavioral: Negative.   Alert and Oriented    Medications: Patient's Medications  New Prescriptions   No medications on file  Previous Medications   AMLODIPINE (NORVASC) 10 MG TABLET    Take 10 mg by mouth daily.   BIOTIN PO    Take 1 tablet by mouth daily.   CALCIUM CARBONATE-VITAMIN D (CALCIUM + D PO)    Take 1 tablet by mouth daily.   CLOPIDOGREL (PLAVIX) 75 MG TABLET    Take 75 mg by mouth daily.   CYANOCOBALAMIN (,VITAMIN B-12,) 1000 MCG/ML INJECTION    Inject 1,000 mcg into the muscle every 30 (thirty) days.   CYCLOBENZAPRINE (FLEXERIL) 10 MG TABLET    Take 10 mg by mouth 3 (three) times daily as needed. For muscle spasms   HYDROCODONE-ACETAMINOPHEN (NORCO/VICODIN) 5-325 MG PER TABLET    Take 1 tablet by mouth every 12 (twelve) hours as needed for pain.   MINERAL OIL-HYDROPHILIC PETROLATUM (AQUAPHOR) OINTMENT    Apply 1 application topically daily.   MULTIPLE VITAMIN (MULTIVITAMIN WITH MINERALS) TABS    Take 1 tablet by mouth daily.  Modified Medications   No medications on file  Discontinued Medications   DOXYCYCLINE (VIBRA-TABS) 100 MG TABLET    Take 1 tablet (100 mg total) by mouth 2 (two) times daily. X 2 weeks   FAMOTIDINE (PEPCID) 10 MG TABLET    Take 1 tablet (10 mg total) by mouth 2 (two) times daily.   OMEPRAZOLE (PRILOSEC) 20 MG CAPSULE    Take 20 mg by mouth 2 (two) times daily.     Physical Exam: Physical Exam  Constitutional: She is oriented to person, place, and time. She appears well-developed and well-nourished. No distress.  HENT: Head: Normocephalic and atraumatic.  Eyes: Conjunctivae  are normal. Pupils are equal, round, and reactive to light.  Neck: Normal range of motion. Neck supple.  Cardiovascular: Normal rate and regular rhythm.   Pulmonary/Chest: Effort normal and breath sounds normal.  Abdominal: Soft. Bowel sounds are normal.  Musculoskeletal: She exhibits +1 edema.  Unsteady gait- needs assistance to ambulate from bed to wheelchair with walker Blister to right lower  extremity   Neurological: She is alert and oriented to person, place, and time.  Skin: Skin is warm and dry. She is not diaphoretic.  Psychiatric: She has a normal mood and affect. Her behavior is normal. Thought content normal.   Filed Vitals:   03/24/13 1718  BP: 155/73  Pulse: 68  Temp: 98.6 F (37 C)  Resp: 20      Labs reviewed: Basic Metabolic Panel:  Recent Labs  16/10/96 0652 01/31/13 0558 02/01/13 0620  NA 138 137 138  K 3.6 3.7 4.0  CL 104 105 105  CO2 27 26 28   GLUCOSE 94 104* 107*  BUN 58* 43* 28*  CREATININE 1.78* 1.53* 1.26*  CALCIUM 8.7 8.5 8.9    Liver Function Tests:  Recent Labs  10/11/12 1235  AST 24  ALT 16  ALKPHOS 85  BILITOT 0.4  PROT 8.1  ALBUMIN 3.7    CBC:  Recent Labs  01/28/13 1157 01/28/13 2113 01/29/13 0530  WBC 14.6* 12.1* 10.9*  HGB 10.8* 10.0* 9.9*  HCT 33.6* 30.8* 30.6*  MCV 94.4 93.9 94.2  PLT 278 241 251     Assessment/Plan Hypertension Stable on current medications   Edema improved  Cellulitis of left lower leg Improved with completion of antibiotics and wound care  Fall History of fall- at high risk for additional falls- does not wish to stay here at [facility] even though this has been recommended pt confirmed with SW that she does have 24 hour care. Pt will need PT/OT per home health. Also will get Home health to evaluate left lower leg and SW  Per home health   Stroke Stable- cont current medications   Anemia Stable- cont b12 injections monthly    pt is stable for discharge with 24 hour care-will need PT/OT/Nursing/SW per home health. No DME needed. Rx sent to pharm via epic and HC/APAP written.  will need to follow up with PCP within 2 weeks.

## 2013-04-08 ENCOUNTER — Emergency Department (HOSPITAL_COMMUNITY): Payer: Medicare Other

## 2013-04-08 ENCOUNTER — Encounter (HOSPITAL_COMMUNITY): Payer: Self-pay | Admitting: Emergency Medicine

## 2013-04-08 ENCOUNTER — Inpatient Hospital Stay (HOSPITAL_COMMUNITY)
Admission: EM | Admit: 2013-04-08 | Discharge: 2013-04-11 | DRG: 092 | Disposition: A | Discharge status: Skilled Nursing Facility | Payer: Medicare Other | Attending: Internal Medicine | Admitting: Internal Medicine

## 2013-04-08 DIAGNOSIS — N179 Acute kidney failure, unspecified: Secondary | ICD-10-CM

## 2013-04-08 DIAGNOSIS — I890 Lymphedema, not elsewhere classified: Secondary | ICD-10-CM

## 2013-04-08 DIAGNOSIS — M1990 Unspecified osteoarthritis, unspecified site: Secondary | ICD-10-CM

## 2013-04-08 DIAGNOSIS — W19XXXA Unspecified fall, initial encounter: Secondary | ICD-10-CM

## 2013-04-08 DIAGNOSIS — G92 Toxic encephalopathy: Principal | ICD-10-CM | POA: Diagnosis present

## 2013-04-08 DIAGNOSIS — R262 Difficulty in walking, not elsewhere classified: Secondary | ICD-10-CM

## 2013-04-08 DIAGNOSIS — D75A Glucose-6-phosphate dehydrogenase (G6PD) deficiency without anemia: Secondary | ICD-10-CM

## 2013-04-08 DIAGNOSIS — I10 Essential (primary) hypertension: Secondary | ICD-10-CM | POA: Diagnosis present

## 2013-04-08 DIAGNOSIS — D510 Vitamin B12 deficiency anemia due to intrinsic factor deficiency: Secondary | ICD-10-CM

## 2013-04-08 DIAGNOSIS — R609 Edema, unspecified: Secondary | ICD-10-CM

## 2013-04-08 DIAGNOSIS — Z6841 Body Mass Index (BMI) 40.0 and over, adult: Secondary | ICD-10-CM

## 2013-04-08 DIAGNOSIS — R29898 Other symptoms and signs involving the musculoskeletal system: Secondary | ICD-10-CM | POA: Diagnosis present

## 2013-04-08 DIAGNOSIS — Z87891 Personal history of nicotine dependence: Secondary | ICD-10-CM

## 2013-04-08 DIAGNOSIS — R197 Diarrhea, unspecified: Secondary | ICD-10-CM

## 2013-04-08 DIAGNOSIS — R110 Nausea: Secondary | ICD-10-CM

## 2013-04-08 DIAGNOSIS — D649 Anemia, unspecified: Secondary | ICD-10-CM

## 2013-04-08 DIAGNOSIS — M25552 Pain in left hip: Secondary | ICD-10-CM

## 2013-04-08 DIAGNOSIS — I639 Cerebral infarction, unspecified: Secondary | ICD-10-CM

## 2013-04-08 DIAGNOSIS — E875 Hyperkalemia: Secondary | ICD-10-CM

## 2013-04-08 DIAGNOSIS — G929 Unspecified toxic encephalopathy: Principal | ICD-10-CM | POA: Diagnosis present

## 2013-04-08 DIAGNOSIS — L0390 Cellulitis, unspecified: Secondary | ICD-10-CM

## 2013-04-08 DIAGNOSIS — L03116 Cellulitis of left lower limb: Secondary | ICD-10-CM

## 2013-04-08 DIAGNOSIS — I872 Venous insufficiency (chronic) (peripheral): Secondary | ICD-10-CM | POA: Diagnosis present

## 2013-04-08 DIAGNOSIS — D551 Anemia due to other disorders of glutathione metabolism: Secondary | ICD-10-CM | POA: Diagnosis present

## 2013-04-08 DIAGNOSIS — T4275XA Adverse effect of unspecified antiepileptic and sedative-hypnotic drugs, initial encounter: Secondary | ICD-10-CM | POA: Diagnosis present

## 2013-04-08 DIAGNOSIS — Z8673 Personal history of transient ischemic attack (TIA), and cerebral infarction without residual deficits: Secondary | ICD-10-CM

## 2013-04-08 DIAGNOSIS — R4182 Altered mental status, unspecified: Secondary | ICD-10-CM | POA: Diagnosis present

## 2013-04-08 DIAGNOSIS — Y92009 Unspecified place in unspecified non-institutional (private) residence as the place of occurrence of the external cause: Secondary | ICD-10-CM

## 2013-04-08 DIAGNOSIS — R109 Unspecified abdominal pain: Secondary | ICD-10-CM

## 2013-04-08 LAB — CBC WITH DIFFERENTIAL/PLATELET
Basophils Absolute: 0 10*3/uL (ref 0.0–0.1)
HCT: 32.6 % — ABNORMAL LOW (ref 36.0–46.0)
Lymphocytes Relative: 18 % (ref 12–46)
Monocytes Absolute: 0.6 10*3/uL (ref 0.1–1.0)
Neutro Abs: 6.8 10*3/uL (ref 1.7–7.7)
Platelets: 286 10*3/uL (ref 150–400)
RDW: 13.5 % (ref 11.5–15.5)
WBC: 9.4 10*3/uL (ref 4.0–10.5)

## 2013-04-08 LAB — BASIC METABOLIC PANEL
CO2: 31 mEq/L (ref 19–32)
Chloride: 101 mEq/L (ref 96–112)
Potassium: 3.5 mEq/L (ref 3.5–5.1)
Sodium: 139 mEq/L (ref 135–145)

## 2013-04-08 LAB — POCT I-STAT 3, ART BLOOD GAS (G3+)
Bicarbonate: 30 mEq/L — ABNORMAL HIGH (ref 20.0–24.0)
pH, Arterial: 7.449 (ref 7.350–7.450)
pO2, Arterial: 69 mmHg — ABNORMAL LOW (ref 80.0–100.0)

## 2013-04-08 MED ORDER — ZOLPIDEM TARTRATE 5 MG PO TABS: 5.0000 mg | ORAL_TABLET | Freq: Every evening | ORAL | Status: DC | PRN

## 2013-04-08 MED ORDER — SODIUM CHLORIDE 0.9 % IJ SOLN
3.0000 mL | Freq: Two times a day (BID) | INTRAMUSCULAR | Status: DC
  Administered 2013-04-09 – 2013-04-11 (×5): 3 mL via INTRAVENOUS

## 2013-04-08 MED ORDER — ALUM & MAG HYDROXIDE-SIMETH 200-200-20 MG/5ML PO SUSP: 30.0000 mL | Freq: Four times a day (QID) | ORAL | Status: DC | PRN

## 2013-04-08 MED ORDER — ACETAMINOPHEN 325 MG PO TABS: 650.0000 mg | ORAL_TABLET | Freq: Four times a day (QID) | ORAL | Status: DC | PRN

## 2013-04-08 MED ORDER — ONDANSETRON HCL 4 MG PO TABS: 4.0000 mg | ORAL_TABLET | Freq: Four times a day (QID) | ORAL | Status: DC | PRN

## 2013-04-08 MED ORDER — AMLODIPINE BESYLATE 10 MG PO TABS
10.0000 mg | ORAL_TABLET | Freq: Every day | ORAL | Status: DC
  Administered 2013-04-09 – 2013-04-11 (×3): 10 mg via ORAL
  Filled 2013-04-08 (×3): qty 1

## 2013-04-08 MED ORDER — ONDANSETRON HCL 4 MG/2ML IJ SOLN: 4.0000 mg | Freq: Four times a day (QID) | INTRAMUSCULAR | Status: DC | PRN

## 2013-04-08 MED ORDER — ADULT MULTIVITAMIN W/MINERALS CH
1.0000 | ORAL_TABLET | Freq: Every day | ORAL | Status: DC
Start: 2013-04-09 — End: 2013-04-11
  Administered 2013-04-09 – 2013-04-11 (×3): 1 via ORAL
  Filled 2013-04-08 (×3): qty 1

## 2013-04-08 MED ORDER — ACETAMINOPHEN 650 MG RE SUPP: 650.0000 mg | Freq: Four times a day (QID) | RECTAL | Status: DC | PRN

## 2013-04-08 MED ORDER — SODIUM CHLORIDE 0.9 % IJ SOLN
3.0000 mL | INTRAMUSCULAR | Status: DC | PRN
  Administered 2013-04-09: 3 mL via INTRAVENOUS

## 2013-04-08 MED ORDER — CYANOCOBALAMIN 1000 MCG/ML IJ SOLN: 1000.0000 ug | INTRAMUSCULAR | Status: DC

## 2013-04-08 MED ORDER — HYDROMORPHONE HCL PF 1 MG/ML IJ SOLN: 0.5000 mg | INTRAMUSCULAR | Status: DC | PRN

## 2013-04-08 MED ORDER — ENOXAPARIN SODIUM 30 MG/0.3ML ~~LOC~~ SOLN
30.0000 mg | SUBCUTANEOUS | Status: DC
  Administered 2013-04-09 – 2013-04-10 (×2): 30 mg via SUBCUTANEOUS
  Filled 2013-04-08 (×2): qty 0.3

## 2013-04-08 MED ORDER — OXYCODONE HCL 5 MG PO TABS
5.0000 mg | ORAL_TABLET | ORAL | Status: DC | PRN
  Filled 2013-04-08: qty 1

## 2013-04-08 MED ORDER — BIOTIN 1000 MCG PO TABS: 1000.0000 ug | ORAL_TABLET | Freq: Every day | ORAL | Status: DC

## 2013-04-08 MED ORDER — CALCIUM CARBONATE-VITAMIN D 500-200 MG-UNIT PO TABS
1.0000 | ORAL_TABLET | Freq: Two times a day (BID) | ORAL | Status: DC
  Administered 2013-04-09 – 2013-04-11 (×5): 1 via ORAL
  Filled 2013-04-08 (×7): qty 1

## 2013-04-08 MED ORDER — SODIUM CHLORIDE 0.9 % IV SOLN: 250.0000 mL | INTRAVENOUS | Status: DC | PRN

## 2013-04-08 NOTE — ED Notes (Signed)
Pt unable to ambulate. Pt had difficulty sitting up in bed, and unable to stand from sitting position.

## 2013-04-08 NOTE — ED Notes (Signed)
Respiratory Therapy paged for ABG stick

## 2013-04-08 NOTE — ED Notes (Addendum)
Contacted Social work at [phone] concerning about the care in the home.

## 2013-04-08 NOTE — ED Notes (Addendum)
Brother Connie Miranda called concerning the care of his sister. Connie Miranda states her house is NOT livable, you can barely walk in the home or eat because there is no food. Brother states he is currently being in touch with social work in his area for arrangements for her to be moved from the house due to the conditions. He states she does not have a 24 hour care taker or aid. Its just friends and family who stop by here and there tohelp. Brother is concerned about her going back home due to no one to take care of her. Brother lives in [place].   Connie Miranda [phone] (home) and [phone] (cell).

## 2013-04-08 NOTE — H&P (Signed)
[facility] History and Physical  Connie Miranda ZOX:[id number] DOB: 28-May-1933 DOA: 04/08/2013  Referring physician:  EDP PCP: [name], MD  Specialists:   Chief Complaint: Unable to Walk  HPI: Connie Miranda is a 79 y.o. female with a history of chronic lymphedema and recurrent cellulitis who presents to the ED with complaints of difficulty walking for the past 2 days.   She has not been able to stand up, and reports increased weakness in both legs.   She denies having any fevers or chills syncope or falls.   She was recently released from an SNF to home with home care.      Review of Systems: The patient denies anorexia, fever, weight loss, vision loss, decreased hearing, hoarseness, chest pain, syncope, dyspnea on exertion, peripheral edema, balance deficits, hemoptysis, abdominal pain, melena, hematochezia, severe indigestion/heartburn, hematuria, incontinence, muscle weakness, suspicious skin lesions, transient blindness, difficulty walking, depression, unusual weight change, abnormal bleeding, enlarged lymph nodes, angioedema, and breast masses.    Past Medical History  Diagnosis Date  . Hypertension   . Stroke 09/2000    couldn't move her left foot which resolved  . Edema   . Pernicious anemia   . Glucose-6-phosphate dehydrogenase deficiency     possible diagnosis of G6PD per patient "phosphate 6 deficiency"  . Osteoarthritis    Past Surgical History  Procedure Laterality Date  . Other surgical history      cyst from panty line removed as child  . Bone marrow biopsy  12/1966    for anemia     Medications:  HOME MEDS: Prior to Admission medications   Medication Sig Start Date End Date Taking? Authorizing Provider  amLODipine (NORVASC) 10 MG tablet Take 1 tablet (10 mg total) by mouth daily. 03/24/13  Yes Claudie Revering, NP  Biotin 1000 MCG tablet Take 1 tablet (1 mg total) by mouth daily. 03/24/13  Yes Claudie Revering, NP  Calcium Carbonate-Vitamin D  (CALCIUM + D PO) Take 1 tablet by mouth daily.   Yes Historical Provider, MD  clopidogrel (PLAVIX) 75 MG tablet Take 1 tablet (75 mg total) by mouth daily. 03/24/13  Yes Claudie Revering, NP  cyanocobalamin (,VITAMIN B-12,) 1000 MCG/ML injection Inject 1 mL (1,000 mcg total) into the muscle every 30 (thirty) days. 03/24/13  Yes Claudie Revering, NP  cyclobenzaprine (FLEXERIL) 10 MG tablet Take 1 tablet (10 mg total) by mouth 3 (three) times daily as needed. For muscle spasms 03/24/13  Yes Claudie Revering, NP  HYDROcodone-acetaminophen (NORCO/VICODIN) 5-325 MG per tablet Take 1 tablet by mouth every 12 (twelve) hours as needed for pain. For pain 03/24/13  Yes Claudie Revering, NP  mineral oil-hydrophilic petrolatum (AQUAPHOR) ointment Apply 1 application topically daily. To lower extremities 03/24/13  Yes Claudie Revering, NP  Multiple Vitamin (MULTIVITAMIN WITH MINERALS) TABS Take 1 tablet by mouth daily. 03/24/13  Yes Claudie Revering, NP    Allergies:  Allergies  Allergen Reactions  . Asa (Aspirin) Other (See Comments)    Doctor told her not to take due to type anemia  . Codeine Other (See Comments)    Made her too sleepy  . Penicillins Itching and Swelling  . Phosphate Other (See Comments)    Anything containing phosphate 6. Unknown reaction    Social History: Lives Alone  reports that she quit smoking about 19 years ago. Her smoking use included Cigarettes.  She does not have any smokeless tobacco history on file. She does  not drink alcohol and she denies using illicit drugs.  Family History: Family History  Problem Relation Age of Onset  . Diabetes Brother   . Diabetes Brother   . Heart failure Mother 40  . Heart attack Father 55    Physical Exam:  GEN:  Pleasant  Elderly morbidly Obese 79 year old African American examined  and in no acute distress; cooperative with exam Filed Vitals:   04/08/13 1820 04/08/13 1836 04/08/13 2220 04/08/13 2324  BP: 163/94 129/50 110/64 124/69  Pulse:  94 98 89 92  Temp:   98.2 F (36.8 C) 98.4 F (36.9 C)  TempSrc:   Oral Oral  Resp: 13 16 18 18   SpO2: 99% 98% 97% 97%   Blood pressure 124/69, pulse 92, temperature 98.4 F (36.9 C), temperature source Oral, resp. rate 18, SpO2 97.00%. PSYCH: She is alert and oriented x4; does not appear anxious does not appear depressed; affect is normal HEENT: Normocephalic and Atraumatic, Mucous membranes pink; PERRLA; EOM intact; Fundi:  Benign;  No scleral icterus, Nares: Patent, Oropharynx: Clear, Edentulous.   Neck:  FROM, no cervical lymphadenopathy nor thyromegaly or carotid bruit; no JVD; Breasts:: Not examined CHEST WALL: No tenderness CHEST: Normal respiration, clear to auscultation bilaterally HEART: Regular rate and rhythm; no murmurs rubs or gallops BACK: No kyphosis or scoliosis; no CVA tenderness ABDOMEN: Positive Bowel Sounds, Obese, soft non-tender; no masses, no organomegaly. Rectal Exam: Not done EXTREMITIES: No cyanosis, clubbing,   4+ EDEMA.    Genitalia: not examined PULSES: 2+ and symmetric SKIN: Normal hydration no rash or ulceration CNS: Cranial nerves 2-12 grossly intact no focal neurologic deficit   Labs & Imaging Results for orders placed during the hospital encounter of 04/08/13 (from the past 48 hour(s))  CBC WITH DIFFERENTIAL     Status: Abnormal   Collection Time    04/08/13  4:16 PM      Result Value Range   WBC 9.4  4.0 - 10.5 K/uL   RBC 3.55 (*) 3.87 - 5.11 MIL/uL   Hemoglobin 10.7 (*) 12.0 - 15.0 g/dL   HCT 45.4 (*) 09.8 - 11.9 %   MCV 91.8  78.0 - 100.0 fL   MCH 30.1  26.0 - 34.0 pg   MCHC 32.8  30.0 - 36.0 g/dL   RDW 14.7  82.9 - 56.2 %   Platelets 286  150 - 400 K/uL   Neutrophils Relative 72  43 - 77 %   Neutro Abs 6.8  1.7 - 7.7 K/uL   Lymphocytes Relative 18  12 - 46 %   Lymphs Abs 1.7  0.7 - 4.0 K/uL   Monocytes Relative 7  3 - 12 %   Monocytes Absolute 0.6  0.1 - 1.0 K/uL   Eosinophils Relative 4  0 - 5 %   Eosinophils Absolute 0.3  0.0 -  0.7 K/uL   Basophils Relative 0  0 - 1 %   Basophils Absolute 0.0  0.0 - 0.1 K/uL  BASIC METABOLIC PANEL     Status: Abnormal   Collection Time    04/08/13  4:16 PM      Result Value Range   Sodium 139  135 - 145 mEq/L   Potassium 3.5  3.5 - 5.1 mEq/L   Chloride 101  96 - 112 mEq/L   CO2 31  19 - 32 mEq/L   Glucose, Bld 110 (*) 70 - 99 mg/dL   BUN 21  6 - 23 mg/dL   Creatinine,  Ser 0.99  0.50 - 1.10 mg/dL   Calcium 16.1  8.4 - 09.6 mg/dL   GFR calc non Af Amer 54 (*) >90 mL/min   GFR calc Af Amer 62 (*) >90 mL/min   Comment:            The eGFR has been calculated     using the CKD EPI equation.     This calculation has not been     validated in all clinical     situations.     eGFR's persistently     <90 mL/min signify     possible Chronic Kidney Disease.  POCT I-STAT 3, BLOOD GAS (G3+)     Status: Abnormal   Collection Time    04/08/13  9:24 PM      Result Value Range   pH, Arterial 7.449  7.350 - 7.450   pCO2 arterial 43.2  35.0 - 45.0 mmHg   pO2, Arterial 69.0 (*) 80.0 - 100.0 mmHg   Bicarbonate 30.0 (*) 20.0 - 24.0 mEq/L   TCO2 31  0 - 100 mmol/L   O2 Saturation 94.0     Acid-Base Excess 5.0 (*) 0.0 - 2.0 mmol/L   Collection site RADIAL, ALLEN'S TEST ACCEPTABLE     Drawn by Operator     Sample type ARTERIAL       Dg Chest 2 View  04/08/2013  *RADIOLOGY REPORT*  Clinical Data: Fever, altered mental status  CHEST - 2 VIEW  Comparison: 01/28/2013  Findings: Lungs are essentially clear.  No pleural effusion or pneumothorax.  The heart is top normal in size.  Degenerative changes of the visualized thoracolumbar spine.  IMPRESSION: No evidence of acute cardiopulmonary disease.   Original Report Authenticated By: Charline Bills, M.D.    Dg Lumbar Spine Complete  04/08/2013  *RADIOLOGY REPORT*  Clinical Data: Low back pain and weakness.  LUMBAR SPINE - COMPLETE 4+ VIEW  Comparison: None.  Findings: No evidence of acute fracture, spondylolysis, or spondylolisthesis.   Moderate to severe degenerative disc disease is seen in all lumbar levels.  Bilateral facet DJD seen at L4-5 and L5-S1, right side greater than left.  No focal lytic or sclerotic bone lesions identified.  IUD noted in the pelvis.  IMPRESSION:  1.  No acute findings. 2.  Moderate to severe degenerative spondylosis, as described above. 3.  Pelvic IUD incidentally noted.   Original Report Authenticated By: Myles Rosenthal, M.D.    Ct Head Wo Contrast  04/08/2013  *RADIOLOGY REPORT*  Clinical Data: 79 year old female with difficulty walking and altered mental status.  CT HEAD WITHOUT CONTRAST  Technique:  Contiguous axial images were obtained from the base of the skull through the vertex without contrast.  Comparison: None.  Findings: Minimal paranasal sinus mucosal thickening.  Mastoids are clear. No acute osseous abnormality identified.  Visualized orbits and scalp soft tissues are within normal limits.  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  Atlantodens interval appears normal.  Chronic left corona radiata/basal ganglia lacunar infarct.  Patchy white matter hypodensity elsewhere. Cerebral volume is within normal limits for age.  No midline shift, mass effect, or evidence of mass lesion.  No evidence of cortically based acute infarction identified.  No acute intracranial hemorrhage identified.  No suspicious intracranial vascular hyperdensity. No midline shift, mass effect, or evidence of mass lesion.  IMPRESSION: Chronic small vessel ischemia. No acute intracranial abnormality.   Original Report Authenticated By: Erskine Speed, M.D.  Assessment/Plan: Present on Admission:  . Difficulty walking . Weakness of both legs . Lymphedema . Hypertension . Morbid obesity    1.   Difficulty Walking-   MTI of Lumbar spine in AM.  If Negative for a diskiitis, PT and OT evaluations will be ordered.     2.   Weakness of Both Legs- Evaluate for diskiitis.   Same as  #1  3.   Lymphedema- chronic, No changes.     4.   HTN- stable on medications continue Amlodipine.     5.  Morbid Obesity- No changes.          Code Status:     FULL CODE Family Communication:    No Family Present Disposition Plan:    TBA  Time spent:  89 Minutes  Ron Parker [facility] Pager [phone]  If 7PM-7AM, please contact night-coverage [email or web address] Password [email or web address] 04/08/2013, 11:47 PM  Gabreille Dardis C 04/08/2013, 11:53 PM

## 2013-04-08 NOTE — ED Notes (Signed)
Unable to get urine for U/A. RN requested an order for a foley from PA, PA wanted to see if pt could ambulate. RN and EMT tried to ambulate pt, but pt could not stand from a sitting position.

## 2013-04-08 NOTE — ED Notes (Signed)
Unable to get much information from pt re: her home situation because of extreme lethargy.  Pt does admit to having HHC, but unsure if she has any other care at home.  Pt unable to tell CSW who assists her with grocery shopping, meal prep, meds, etc.  CSW also unclear about pt's ambulation abilities and if she can perform ADLs independently.  Spoke with pt's brother, Connie Miranda in [place].  He can ne reached at [phone] or [phone].  Connie Miranda believes that pt has 90 Medicare days left for SNF following her recent [facility] stay. Jimmy plans to see if he can arrange care for pt over the weekend and try to figure out what, if any, care pt has received since leaving [place]. CSW unable to speak with [facility] re: pt's ? Readmission until Monday when their business office is open and admissions coordinator is there.  CSW does not feel like pt is in any way safe to go home alone at this time.  CSW will follow.

## 2013-04-08 NOTE — ED Notes (Signed)
Report given to floor, and RN communicated with receiving RN that pt was having problems with incontinence and was unable to tell RN in the ED when she had to go to restroom. RN communicated with receiving RN that getting pt on bedpan requires 3 staff assist, and a foley may be best for pt.

## 2013-04-08 NOTE — ED Provider Notes (Signed)
I seem to care of the patient from Dr. Bernette Mayers. Patient has a history of chroniclymphedema.  She was sent to the emergency department by her home were health worker the EMS for draining blisters on her leg.  Patient was recently discharged home from skilled nursing facility where she is being treated for cellulitis and renal failure with aggressive antibiotics and wound care.  Patient is unsure why she is here she feels she is at baseline. At shift change the pain and became more somnolent.  She is currently undergoing further lab values including ABG and a CT of the head.   Patient states that she was able to ambulate 2 days ago and cannot do so now.  She is unable to explain why.  There is no marked weakness on neuro exam.  dtrs dificult to asses b/c of the patient's marked edema.  Patient was able to stand with assistance and a walker but states that she was unable to move her legs to ambulate.  She denies any back pain, however I am concerned for a possible discitis considering she had abrupt inability to ambulate.  The patient is lucid and has not AMS at this time. I have spoken with Dr,.Hung in radiology who suggests patient start with plain films of the lumbar spine.   10:48 PM i have spoken with Dr, Lovell Sheehan who will admit the patient for further work up.   Arthor Captain, PA-C 04/08/13 2248

## 2013-04-08 NOTE — ED Notes (Signed)
Pt. returned from XR. 

## 2013-04-08 NOTE — ED Provider Notes (Addendum)
History     CSN: [id number]  Arrival date & time 04/08/13  1542   First MD Initiated Contact with Patient 04/08/13 1550      Chief Complaint  Patient presents with  . Edema in Lower legs     (Consider location/radiation/quality/duration/timing/severity/associated sxs/prior treatment) HPI Pt with history of chronic bilateral LE lymphedema was recently admitted for cellulitis and renal failure, had aggressive Abx and wound care for LE ulcers and has been home for a few weeks with home health. She was doing well recently, but having trouble getting out of her chair this afternoon. EMS was called and they were convinced to bring the patient in for evaluation of several large blisters draining on her LE recently. Fluid has been clear, no fever. No change in size of legs or pain. She is not sure why she is here. States she feels at baseline. She is preparing to move to [place] where her extended family lives to live in an ALF. She has help at home and does not feel unsafe there.   Past Medical History  Diagnosis Date  . Hypertension   . Stroke 09/2000    couldn't move her left foot which resolved  . Edema   . Pernicious anemia   . Glucose-6-phosphate dehydrogenase deficiency     possible diagnosis of G6PD per patient "phosphate 6 deficiency"  . Osteoarthritis     Past Surgical History  Procedure Laterality Date  . Other surgical history      cyst from panty line removed as child  . Bone marrow biopsy  12/1966    for anemia    Family History  Problem Relation Age of Onset  . Diabetes Brother   . Diabetes Brother   . Heart failure Mother 19  . Heart attack Father 71    History  Substance Use Topics  . Smoking status: Former Smoker    Types: Cigarettes    Quit date: 12/29/1993  . Smokeless tobacco: Not on file  . Alcohol Use: No    OB History   Grav Para Term Preterm Abortions TAB SAB Ect Mult Living                  Review of Systems All other systems reviewed  and are negative except as noted in HPI.   Allergies  Asa; Codeine; Penicillins; and Phosphate  Home Medications   Current Outpatient Rx  Name  Route  Sig  Dispense  Refill  . amLODipine (NORVASC) 10 MG tablet   Oral   Take 1 tablet (10 mg total) by mouth daily.   30 tablet   0   . Biotin 1000 MCG tablet   Oral   Take 1 tablet (1 mg total) by mouth daily.   30 tablet   0   . Calcium Carbonate-Vitamin D (CALCIUM + D PO)   Oral   Take 1 tablet by mouth daily.         . clopidogrel (PLAVIX) 75 MG tablet   Oral   Take 1 tablet (75 mg total) by mouth daily.   30 tablet   0   . cyanocobalamin (,VITAMIN B-12,) 1000 MCG/ML injection   Intramuscular   Inject 1 mL (1,000 mcg total) into the muscle every 30 (thirty) days.   1 mL   0   . cyclobenzaprine (FLEXERIL) 10 MG tablet   Oral   Take 1 tablet (10 mg total) by mouth 3 (three) times daily as needed. For muscle spasms  30 tablet   0   . HYDROcodone-acetaminophen (NORCO/VICODIN) 5-325 MG per tablet   Oral   Take 1 tablet by mouth every 12 (twelve) hours as needed for pain. For pain         . mineral oil-hydrophilic petrolatum (AQUAPHOR) ointment   Topical   Apply 1 application topically daily. To lower extremities   420 g   0   . Multiple Vitamin (MULTIVITAMIN WITH MINERALS) TABS   Oral   Take 1 tablet by mouth daily.   30 tablet   0     BP 133/77  Pulse 94  Temp(Src) 98.1 F (36.7 C) (Oral)  Resp 18  SpO2 100%  Physical Exam  Nursing note and vitals reviewed. Constitutional: She is oriented to person, place, and time. She appears well-developed and well-nourished.  HENT:  Head: Normocephalic and atraumatic.  Eyes: EOM are normal. Pupils are equal, round, and reactive to light.  Neck: Normal range of motion. Neck supple.  Cardiovascular: Normal rate, normal heart sounds and intact distal pulses.   Pulmonary/Chest: Effort normal and breath sounds normal.  Abdominal: Bowel sounds are normal. She  exhibits no distension. There is no tenderness.  Musculoskeletal: Normal range of motion. She exhibits edema (Marked LE lymphedema is chronic, there are several small blisters filled with clear fluid and one larger recently ruptured vessicle on R anterior leg; no signs of infection). She exhibits no tenderness.  Neurological: She is alert and oriented to person, place, and time. She has normal strength. No cranial nerve deficit or sensory deficit.  Skin: Skin is warm and dry. No rash noted.  Psychiatric: She has a normal mood and affect.    ED Course  Procedures (including critical care time)  Labs Reviewed  CBC WITH DIFFERENTIAL - Abnormal; Notable for the following:    RBC 3.55 (*)    Hemoglobin 10.7 (*)    HCT 32.6 (*)    All other components within normal limits  BASIC METABOLIC PANEL - Abnormal; Notable for the following:    Glucose, Bld 110 (*)    GFR calc non Af Amer 54 (*)    GFR calc Af Amer 62 (*)    All other components within normal limits  URINALYSIS, ROUTINE W REFLEX MICROSCOPIC   No results found.   1. Lymphedema       MDM  Pt appears at baseline. Wounds on leg are well healed, no signs of infection. Will need dressing over ruptured blisters. Will check CBC, BMP to ensure she is at baseline, but otherwise anticipate discharge. She is amenable to this plan.   6:03 PM Labs at baseline. Pt ready to go home. No concerns for getting care at home.      Charles B. Bernette Mayers, MD 04/08/13 1805  6:50 PM Pt's brother on the phone (from [place] area) states that the patient does not have adequate help at home, her home is in poor condition, filthy and unlivable. This is not consistent with what the patient is saying, but similar to EMS report and previous ED visits. Will ask Social Work to evaluate the patient.  Charles B. Bernette Mayers, MD 04/08/13 1852  8:16 PM Pt more somnolent now than on arrival. Difficult to ascertain what her home situation is from her at this point.  Will add additional test to evaluate somnolence. Care signed out to Tonny Bollman, PA at the change of shift.   Charles B. Bernette Mayers, MD 04/08/13 2017

## 2013-04-08 NOTE — ED Notes (Signed)
Pt returned from XR/CT, pt placed back on monitor 

## 2013-04-08 NOTE — ED Notes (Signed)
Pt transported to XR.  

## 2013-04-08 NOTE — ED Notes (Signed)
Social work at bedside. Pt is a little sleepy and drowsy. Patient can barely open eyes to talk to Child psychotherapist. Pt is alert but all over the place with answering questions. MD notified. RN notified. Pt connected to monitor and will be continued to be observed.

## 2013-04-08 NOTE — ED Notes (Signed)
MD at bedside. Connie Miranda 

## 2013-04-08 NOTE — ED Notes (Signed)
Brother contacted and informed that pt will be admitted to hospital

## 2013-04-08 NOTE — ED Notes (Signed)
Pt transported to CT/XR 

## 2013-04-08 NOTE — ED Notes (Signed)
Admitting at bedside 

## 2013-04-08 NOTE — ED Notes (Signed)
Respiratory Therapy at bedside for ABG

## 2013-04-08 NOTE — ED Notes (Signed)
EMS states she has a home health nurse that was on her first day and concerned about patient well being in the home. Pt not able to walk around and is incontinent to bowel. EMS states patient was not living in the best living condition to be staying by self. Pt states she has an aid but no one was there but the home health nurse who just arrived. Pt states the aid is usually at the store and come back before dark. Pt states she was able to walk a week ago but today had trouble getting up. Pt lower legs is swollen with open blisters on th right lower leg. Pt is also complaining left arm pain

## 2013-04-09 ENCOUNTER — Inpatient Hospital Stay (HOSPITAL_COMMUNITY): Payer: Medicare Other

## 2013-04-09 DIAGNOSIS — I10 Essential (primary) hypertension: Secondary | ICD-10-CM

## 2013-04-09 DIAGNOSIS — I890 Lymphedema, not elsewhere classified: Secondary | ICD-10-CM

## 2013-04-09 DIAGNOSIS — R262 Difficulty in walking, not elsewhere classified: Secondary | ICD-10-CM

## 2013-04-09 DIAGNOSIS — R609 Edema, unspecified: Secondary | ICD-10-CM

## 2013-04-09 LAB — BASIC METABOLIC PANEL
BUN: 17 mg/dL (ref 6–23)
Calcium: 9.6 mg/dL (ref 8.4–10.5)
GFR calc Af Amer: 70 mL/min — ABNORMAL LOW (ref >90)
GFR calc non Af Amer: 60 mL/min — ABNORMAL LOW (ref >90)
Potassium: 3.4 mEq/L — ABNORMAL LOW (ref 3.5–5.1)
Sodium: 140 mEq/L (ref 135–145)

## 2013-04-09 LAB — CBC
Hemoglobin: 9.1 g/dL — ABNORMAL LOW (ref 12.0–15.0)
MCHC: 32.9 g/dL (ref 30.0–36.0)
MCV: 91.4 fL (ref 78.0–100.0)
Platelets: 241 10*3/uL (ref 150–400)
Platelets: 253 10*3/uL (ref 150–400)
RBC: 3.24 MIL/uL — ABNORMAL LOW (ref 3.87–5.11)
WBC: 8.1 10*3/uL (ref 4.0–10.5)

## 2013-04-09 LAB — URINALYSIS, ROUTINE W REFLEX MICROSCOPIC
Glucose, UA: NEGATIVE mg/dL
Leukocytes, UA: NEGATIVE
Specific Gravity, Urine: 1.014 (ref 1.005–1.030)
pH: 6.5 (ref 5.0–8.0)

## 2013-04-09 LAB — CREATININE, SERUM
Creatinine, Ser: 0.9 mg/dL (ref 0.50–1.10)
GFR calc Af Amer: 70 mL/min — ABNORMAL LOW (ref >90)
GFR calc non Af Amer: 60 mL/min — ABNORMAL LOW (ref >90)

## 2013-04-09 MED ORDER — CLOPIDOGREL BISULFATE 75 MG PO TABS
75.0000 mg | ORAL_TABLET | Freq: Every day | ORAL | Status: DC
  Administered 2013-04-09 – 2013-04-11 (×3): 75 mg via ORAL
  Filled 2013-04-09 (×5): qty 1

## 2013-04-09 MED ORDER — FUROSEMIDE 40 MG PO TABS
40.0000 mg | ORAL_TABLET | Freq: Every day | ORAL | Status: DC
  Administered 2013-04-09 – 2013-04-11 (×3): 40 mg via ORAL
  Filled 2013-04-09 (×4): qty 1

## 2013-04-09 MED ORDER — HYDRALAZINE HCL 20 MG/ML IJ SOLN: 5.0000 mg | Freq: Four times a day (QID) | INTRAMUSCULAR | Status: DC | PRN

## 2013-04-09 MED ORDER — GADOBENATE DIMEGLUMINE 529 MG/ML IV SOLN
20.0000 mL | Freq: Once | INTRAVENOUS | Status: AC | PRN
  Administered 2013-04-09: 20 mL via INTRAVENOUS

## 2013-04-09 MED ORDER — POTASSIUM CHLORIDE CRYS ER 20 MEQ PO TBCR
20.0000 meq | EXTENDED_RELEASE_TABLET | Freq: Every day | ORAL | Status: DC
  Administered 2013-04-10 – 2013-04-11 (×2): 20 meq via ORAL
  Filled 2013-04-09 (×3): qty 1

## 2013-04-09 MED ORDER — POTASSIUM CHLORIDE CRYS ER 20 MEQ PO TBCR
40.0000 meq | EXTENDED_RELEASE_TABLET | Freq: Once | ORAL | Status: AC
  Administered 2013-04-09: 40 meq via ORAL
  Filled 2013-04-09: qty 2

## 2013-04-09 NOTE — Progress Notes (Signed)
PATIENT DETAILS Name: Connie Miranda Age: 79 y.o. Sex: female Date of Birth: 08/09/1933 Admit Date: 04/08/2013 Admitting Physician Ron Parker, MD RUE:[id number],XBJYN A, MD  Subjective: Patient more somnolent today. Not sure exactly what happened yesterday. Claims that she is at baseline, she claims that she has chronic mild left upper and left lower extremity weakness  Assessment/Plan: Active Problems: Altered mental status - This was in the form of lethargy and somnolence when the patient arrived in the emergency room, not known what exactly the cause of this is. But suspect encephalopathy from? Narcotic and Flexeril use (home medication list-shows patient on Flexeril and Norco). No white count or fever to suggest any underlying infectious process, chest x-ray negative for pneumonia and UA negative for UTI. - Currently completely awake and alert. - For now minimize narcotics and sedating medication - CT of the head on admission negative for CVA.  Lower extremity weakness with difficulty walking - MRI of the lumbar sacral spine does not show any acute abnormality - Not sure what exactly happened, but may be she was confused and sedated from narcotic/benzos - She does have mild left lower extremity weakness compared to the right lower extremity, however upon repeated asking her she claims that this is her usual baseline as she had a stroke in 2001. She claims to have pain which is chronic on her left shoulder area-as a result she claims that her left upper arm is somewhat weak. Given the fact that the patient is a poor historian, we'll check a MRI of her brain. - Unfortunately per ED documentation-patient has poor hygiene conditions at home and is virtually not livable-will ask social worker to evaluate - Will get PT eval    Hypertension - Moderate controlled with amlodipine - Has chronic lower extremity lymphedema-will start low-dose diuretic therapy and slowly adjust   Lymphedema with chronic venous stasis - Currently this seems stable with no obvious discharge.  History of CVA - Resume Plavix.    Morbid obesity - Counseled regarding the importance of weight loss  Disposition: Remain inpatient- await PT eval-suspect may need SNF placement  DVT Prophylaxis: Prophylactic Lovenox   Code Status: Full code   Procedures:  None  CONSULTS:  None  PHYSICAL EXAM: Vital signs in last 24 hours: Filed Vitals:   04/09/13 1005 04/09/13 1046 04/09/13 1336 04/09/13 1340  BP:  141/49  153/50  Pulse:  90  91  Temp:  99.1 F (37.3 C)  99.6 F (37.6 C)  TempSrc:    Oral  Resp:    20  Height:   5\' 5"  (1.651 m)   Weight:   123.378 kg (272 lb)   SpO2: 95% 94%  95%    Weight change:  Body mass index is 45.26 kg/(m^2).   Gen Exam: Awake and alert with clear speech.   Neck: Supple, No JVD.   Chest: B/L Clear.   CVS: S1 S2 Regular, no murmurs.  Abdomen: soft, BS +, non tender, non distended.  Extremities: Chronic bilateral lower extremity lymphedema with chronic venous stasis changes, lower extremities warm to touch. Neurologic: Non Focal. Mild left upper extremity weakness-? From pain and very mild left lower extremity weakness.  Skin: No Rash.   Wounds: N/A.    Intake/Output from previous day:  Intake/Output Summary (Last 24 hours) at 04/09/13 1545 Last data filed at 04/09/13 0125  Gross per 24 hour  Intake      0 ml  Output    150 ml  Net   -  150 ml     LAB RESULTS: CBC  Recent Labs Lab 04/08/13 1616 04/08/13 2351 04/09/13 0700  WBC 9.4 8.1 6.2  HGB 10.7* 9.7* 9.1*  HCT 32.6* 29.6* 27.7*  PLT 286 253 241  MCV 91.8 91.4 90.8  MCH 30.1 29.9 29.8  MCHC 32.8 32.8 32.9  RDW 13.5 13.4 13.6  LYMPHSABS 1.7  --   --   MONOABS 0.6  --   --   EOSABS 0.3  --   --   BASOSABS 0.0  --   --     Chemistries   Recent Labs Lab 04/08/13 1616 04/08/13 2351 04/09/13 0700  NA 139  --  140  K 3.5  --  3.4*  CL 101  --  105  CO2 31  --   29  GLUCOSE 110*  --  93  BUN 21  --  17  CREATININE 0.99 0.90 0.90  CALCIUM 10.5  --  9.6    CBG: No results found for this basename: GLUCAP,  in the last 168 hours  GFR Estimated Creatinine Clearance: 69.1 ml/min (by C-G formula based on Cr of 0.9).  Coagulation profile No results found for this basename: INR, PROTIME,  in the last 168 hours  Cardiac Enzymes No results found for this basename: CK, CKMB, TROPONINI, MYOGLOBIN,  in the last 168 hours  No components found with this basename: POCBNP,  No results found for this basename: DDIMER,  in the last 72 hours No results found for this basename: HGBA1C,  in the last 72 hours No results found for this basename: CHOL, HDL, LDLCALC, TRIG, CHOLHDL, LDLDIRECT,  in the last 72 hours No results found for this basename: TSH, T4TOTAL, FREET3, T3FREE, THYROIDAB,  in the last 72 hours No results found for this basename: VITAMINB12, FOLATE, FERRITIN, TIBC, IRON, RETICCTPCT,  in the last 72 hours No results found for this basename: LIPASE, AMYLASE,  in the last 72 hours  Urine Studies No results found for this basename: UACOL, UAPR, USPG, UPH, UTP, UGL, UKET, UBIL, UHGB, UNIT, UROB, ULEU, UEPI, UWBC, URBC, UBAC, CAST, CRYS, UCOM, BILUA,  in the last 72 hours  MICROBIOLOGY: No results found for this or any previous visit (from the past 240 hour(s)).  RADIOLOGY STUDIES/RESULTS: Dg Chest 2 View  04/08/2013  *RADIOLOGY REPORT*  Clinical Data: Fever, altered mental status  CHEST - 2 VIEW  Comparison: 01/28/2013  Findings: Lungs are essentially clear.  No pleural effusion or pneumothorax.  The heart is top normal in size.  Degenerative changes of the visualized thoracolumbar spine.  IMPRESSION: No evidence of acute cardiopulmonary disease.   Original Report Authenticated By: Charline Bills, M.D.    Dg Lumbar Spine Complete  04/08/2013  *RADIOLOGY REPORT*  Clinical Data: Low back pain and weakness.  LUMBAR SPINE - COMPLETE 4+ VIEW   Comparison: None.  Findings: No evidence of acute fracture, spondylolysis, or spondylolisthesis.  Moderate to severe degenerative disc disease is seen in all lumbar levels.  Bilateral facet DJD seen at L4-5 and L5-S1, right side greater than left.  No focal lytic or sclerotic bone lesions identified.  IUD noted in the pelvis.  IMPRESSION:  1.  No acute findings. 2.  Moderate to severe degenerative spondylosis, as described above. 3.  Pelvic IUD incidentally noted.   Original Report Authenticated By: Myles Rosenthal, M.D.    Ct Head Wo Contrast  04/08/2013  *RADIOLOGY REPORT*  Clinical Data: 79 year old female with difficulty walking and altered mental status.  CT  HEAD WITHOUT CONTRAST  Technique:  Contiguous axial images were obtained from the base of the skull through the vertex without contrast.  Comparison: None.  Findings: Minimal paranasal sinus mucosal thickening.  Mastoids are clear. No acute osseous abnormality identified.  Visualized orbits and scalp soft tissues are within normal limits.  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  Atlantodens interval appears normal.  Chronic left corona radiata/basal ganglia lacunar infarct.  Patchy white matter hypodensity elsewhere. Cerebral volume is within normal limits for age.  No midline shift, mass effect, or evidence of mass lesion.  No evidence of cortically based acute infarction identified.  No acute intracranial hemorrhage identified.  No suspicious intracranial vascular hyperdensity. No midline shift, mass effect, or evidence of mass lesion.  IMPRESSION: Chronic small vessel ischemia. No acute intracranial abnormality.   Original Report Authenticated By: Erskine Speed, M.D.    Mr Lumbar Spine W Wo Contrast  04/09/2013  *RADIOLOGY REPORT*  Clinical Data: Difficulty walking for 2 days with worsening bilateral lower extremity weakness.  Negative for fever, chills for trauma.  MRI LUMBAR SPINE WITHOUT AND WITH CONTRAST   Technique:  Multiplanar and multiecho pulse sequences of the lumbar spine were obtained without and with intravenous contrast.  Contrast: 20mL MULTIHANCE GADOBENATE DIMEGLUMINE 529 MG/ML IV SOLN  Comparison: Plain films 04/08/2013.  CT abdomen and pelvis 10/11/2012.  Findings: The study is limited by the patient's body habitus and motion on the axial sequences.  Vertebral body height and alignment are maintained.  There is marked multilevel degenerative endplate signal change correlating with endplate sclerosis seen on the patient's CT scan.  The patient has a congenitally narrow central canal due to short pedicle length.  The conus medullaris is normal in signal and position.  Imaged intra-abdominal contents demonstrate a small left renal cyst.  The T10-11 and T11-12 levels are imaged in the sagittal plane only. There is mild disc bulging at each level without central canal or foraminal narrowing.  T12-L1:  Mild disc bulge without central canal or foraminal stenosis.  L1-2:  There is a disc bulge.  The central canal is mildly to moderately narrowed.  Foramina appear open.  L2-3:  Shallow disc bulge and ligamentum flavum thickening result in moderate central canal stenosis in conjunction with short pedicle length.  Foramina are open.  L3-4:  Moderate to moderately severe central canal stenosis is identified with a broad-based disc bulge eccentric to the right. The right foramen is mildly narrowed.  Left foramen is open.  L4-5:  The patient has advanced bilateral facet arthropathy with marrow edema about the right facet joint.  Ligamentum flavum is thickened and there is a broad-based diffuse disc bulge causing severe central canal and lateral recess stenosis.  Moderate to moderately severe foraminal narrowing appears worse on the right.  L5-S1:  Disc bulge and endplate spur are seen.  There is moderately severe central canal stenosis.  Moderately severe to severe foraminal narrowing appears worse on the left.   IMPRESSION:  1.  Negative for diskitis or acute abnormality. 2.  Marked multilevel degenerative change appears worst at L4-5 where there is severe congenital and acquired central canal and lateral recess narrowing.  There is also a right worse than left foraminal narrowing at this level. 3.  Moderate to moderately severe congenital and acquired central canal stenosis L5-S1.  Marked bilateral foraminal narrowing at this level appears worse on the left.   Original Report Authenticated By: Holley Dexter, M.D.  MEDICATIONS: Scheduled Meds: . amLODipine  10 mg Oral Daily  . calcium-vitamin D  1 tablet Oral BID WC  . [START ON 04/24/2013] cyanocobalamin  1,000 mcg Intramuscular Q30 days  . enoxaparin (LOVENOX) injection  30 mg Subcutaneous Q24H  . multivitamin with minerals  1 tablet Oral Daily  . sodium chloride  3 mL Intravenous Q12H   Continuous Infusions:  PRN Meds:.sodium chloride, acetaminophen, acetaminophen, alum & mag hydroxide-simeth, hydrALAZINE, HYDROmorphone (DILAUDID) injection, ondansetron (ZOFRAN) IV, ondansetron, oxyCODONE, sodium chloride, zolpidem  Antibiotics: Anti-infectives   None       Jeoffrey Massed, MD  [facility] Pager:[phone]  If 7PM-7AM, please contact night-coverage [email or web address] Password [facility] 04/09/2013, 3:45 PM   LOS: 1 day

## 2013-04-09 NOTE — Progress Notes (Signed)
Pt arrived via stretcher from the ED. VSS except elevated bp. RN paged MD and received an order for PRN hydralazine. Pt is in no apparent distress. Pt was oriented to room and unit. Safety video has been viewed. RN will continue to monitor pt.

## 2013-04-10 ENCOUNTER — Inpatient Hospital Stay (HOSPITAL_COMMUNITY): Payer: Medicare Other

## 2013-04-10 LAB — BASIC METABOLIC PANEL
BUN: 13 mg/dL (ref 6–23)
CO2: 30 mEq/L (ref 19–32)
Chloride: 104 mEq/L (ref 96–112)
Creatinine, Ser: 0.91 mg/dL (ref 0.50–1.10)

## 2013-04-10 MED ORDER — ENOXAPARIN SODIUM 40 MG/0.4ML ~~LOC~~ SOLN
40.0000 mg | SUBCUTANEOUS | Status: DC
  Administered 2013-04-11: 40 mg via SUBCUTANEOUS
  Filled 2013-04-10: qty 0.4

## 2013-04-10 NOTE — Progress Notes (Signed)
PATIENT DETAILS Name: Connie Miranda Age: 79 y.o. Sex: female Date of Birth: [birth year] Admit Date: 04/08/2013 Admitting Physician Ron Parker, MD NWG:[id number],[id number] A, MD  Subjective: Patient is awake and alert-uneventful night  Assessment/Plan: Active Problems: Altered mental status - This was in the form of lethargy and somnolence when the patient arrived in the emergency room, not known what exactly the cause of this is. But suspect encephalopathy from? Narcotic and Flexeril use (home medication list-shows patient on Flexeril and Norco). No white count or fever to suggest any underlying infectious process, chest x-ray negative for pneumonia and UA negative for UTI. - Currently completely awake and alert. - For now minimize narcotics and sedating medication - CT of the head on admission negative for CVA. - Doubt CVA as well-but getting MRI-given some left-sided weakness (not known whether this is new or old)  Lower extremity weakness with difficulty walking - MRI of the lumbar sacral spine does not show any acute abnormality - Not sure what exactly happened, but may be she was confused and sedated from narcotic/benzos - She does have mild left lower extremity weakness compared to the right lower extremity, however upon repeated asking her she claims that this is her usual baseline as she had a stroke in 2001. She claims to have pain which is chronic on her left shoulder area-as a result she claims that her left upper arm is somewhat weak. Given the fact that the patient is a poor historian, MRI of the brain has been ordered. . - Unfortunately per ED documentation-patient has poor hygiene conditions at home and is virtually not livable-will ask social worker to evaluate - Will get PT eval    Hypertension - Moderate controlled with amlodipine - Has chronic lower extremity lymphedema-will start low-dose diuretic therapy and slowly adjust    Lymphedema with chronic venous  stasis - Currently this seems stable with no obvious discharge.  History of CVA - Resume Plavix.    Morbid obesity - Counseled regarding the importance of weight loss  Disposition: Remain inpatient- await PT eval-suspect may need SNF placement  DVT Prophylaxis: Prophylactic Lovenox   Code Status: Full code   Procedures:  None  CONSULTS:  None  PHYSICAL EXAM: Vital signs in last 24 hours: Filed Vitals:   04/10/13 0212 04/10/13 0643 04/10/13 0821 04/10/13 0952  BP: 152/67 130/83  135/47  Pulse: 111 86  91  Temp: 99.6 F (37.6 C) 98.5 F (36.9 C)  98.6 F (37 C)  TempSrc: Oral Oral  Oral  Resp: 18 18  20   Height:      Weight:   122.8 kg (270 lb 11.6 oz)   SpO2: 92% 94%  97%    Weight change:  Body mass index is 45.05 kg/(m^2).   Gen Exam: Awake and alert with clear speech.   Neck: Supple, No JVD.   Chest: B/L Clear.   CVS: S1 S2 Regular, no murmurs.  Abdomen: soft, BS +, non tender, non distended.  Extremities: Chronic bilateral lower extremity lymphedema with chronic venous stasis changes, lower extremities warm to touch. Neurologic: Non Focal. Mild left upper extremity weakness-? From pain and very mild left lower extremity weakness.  Skin: No Rash.   Wounds: N/A.    Intake/Output from previous day:  Intake/Output Summary (Last 24 hours) at 04/10/13 1151 Last data filed at 04/10/13 0900  Gross per 24 hour  Intake    720 ml  Output      0 ml  Net  720 ml     LAB RESULTS: CBC  Recent Labs Lab 04/08/13 1616 04/08/13 2351 04/09/13 0700  WBC 9.4 8.1 6.2  HGB 10.7* 9.7* 9.1*  HCT 32.6* 29.6* 27.7*  PLT 286 253 241  MCV 91.8 91.4 90.8  MCH 30.1 29.9 29.8  MCHC 32.8 32.8 32.9  RDW 13.5 13.4 13.6  LYMPHSABS 1.7  --   --   MONOABS 0.6  --   --   EOSABS 0.3  --   --   BASOSABS 0.0  --   --     Chemistries   Recent Labs Lab 04/08/13 1616 04/08/13 2351 04/09/13 0700 04/10/13 0830  NA 139  --  140 140  K 3.5  --  3.4* 3.9  CL 101   --  105 104  CO2 31  --  29 30  GLUCOSE 110*  --  93 114*  BUN 21  --  17 13  CREATININE 0.99 0.90 0.90 0.91  CALCIUM 10.5  --  9.6 9.5    CBG: No results found for this basename: GLUCAP,  in the last 168 hours  GFR Estimated Creatinine Clearance: 68.1 ml/min (by C-G formula based on Cr of 0.91).  Coagulation profile No results found for this basename: INR, PROTIME,  in the last 168 hours  Cardiac Enzymes No results found for this basename: CK, CKMB, TROPONINI, MYOGLOBIN,  in the last 168 hours  No components found with this basename: POCBNP,  No results found for this basename: DDIMER,  in the last 72 hours No results found for this basename: HGBA1C,  in the last 72 hours No results found for this basename: CHOL, HDL, LDLCALC, TRIG, CHOLHDL, LDLDIRECT,  in the last 72 hours No results found for this basename: TSH, T4TOTAL, FREET3, T3FREE, THYROIDAB,  in the last 72 hours No results found for this basename: VITAMINB12, FOLATE, FERRITIN, TIBC, IRON, RETICCTPCT,  in the last 72 hours No results found for this basename: LIPASE, AMYLASE,  in the last 72 hours  Urine Studies No results found for this basename: UACOL, UAPR, USPG, UPH, UTP, UGL, UKET, UBIL, UHGB, UNIT, UROB, ULEU, UEPI, UWBC, URBC, UBAC, CAST, CRYS, UCOM, BILUA,  in the last 72 hours  MICROBIOLOGY: No results found for this or any previous visit (from the past 240 hour(s)).  RADIOLOGY STUDIES/RESULTS: Dg Chest 2 View  04/08/2013  *RADIOLOGY REPORT*  Clinical Data: Fever, altered mental status  CHEST - 2 VIEW  Comparison: 01/28/2013  Findings: Lungs are essentially clear.  No pleural effusion or pneumothorax.  The heart is top normal in size.  Degenerative changes of the visualized thoracolumbar spine.  IMPRESSION: No evidence of acute cardiopulmonary disease.   Original Report Authenticated By: Charline Bills, M.D.    Dg Lumbar Spine Complete  04/08/2013  *RADIOLOGY REPORT*  Clinical Data: Low back pain and  weakness.  LUMBAR SPINE - COMPLETE 4+ VIEW  Comparison: None.  Findings: No evidence of acute fracture, spondylolysis, or spondylolisthesis.  Moderate to severe degenerative disc disease is seen in all lumbar levels.  Bilateral facet DJD seen at L4-5 and L5-S1, right side greater than left.  No focal lytic or sclerotic bone lesions identified.  IUD noted in the pelvis.  IMPRESSION:  1.  No acute findings. 2.  Moderate to severe degenerative spondylosis, as described above. 3.  Pelvic IUD incidentally noted.   Original Report Authenticated By: Myles Rosenthal, M.D.    Ct Head Wo Contrast  04/08/2013  *RADIOLOGY REPORT*  Clinical Data: 79 year old  female with difficulty walking and altered mental status.  CT HEAD WITHOUT CONTRAST  Technique:  Contiguous axial images were obtained from the base of the skull through the vertex without contrast.  Comparison: None.  Findings: Minimal paranasal sinus mucosal thickening.  Mastoids are clear. No acute osseous abnormality identified.  Visualized orbits and scalp soft tissues are within normal limits.  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  Atlantodens interval appears normal.  Chronic left corona radiata/basal ganglia lacunar infarct.  Patchy white matter hypodensity elsewhere. Cerebral volume is within normal limits for age.  No midline shift, mass effect, or evidence of mass lesion.  No evidence of cortically based acute infarction identified.  No acute intracranial hemorrhage identified.  No suspicious intracranial vascular hyperdensity. No midline shift, mass effect, or evidence of mass lesion.  IMPRESSION: Chronic small vessel ischemia. No acute intracranial abnormality.   Original Report Authenticated By: Erskine Speed, M.D.    Mr Lumbar Spine W Wo Contrast  04/09/2013  *RADIOLOGY REPORT*  Clinical Data: Difficulty walking for 2 days with worsening bilateral lower extremity weakness.  Negative for fever, chills for trauma.   MRI LUMBAR SPINE WITHOUT AND WITH CONTRAST  Technique:  Multiplanar and multiecho pulse sequences of the lumbar spine were obtained without and with intravenous contrast.  Contrast: 20mL MULTIHANCE GADOBENATE DIMEGLUMINE 529 MG/ML IV SOLN  Comparison: Plain films 04/08/2013.  CT abdomen and pelvis 10/11/2012.  Findings: The study is limited by the patient's body habitus and motion on the axial sequences.  Vertebral body height and alignment are maintained.  There is marked multilevel degenerative endplate signal change correlating with endplate sclerosis seen on the patient's CT scan.  The patient has a congenitally narrow central canal due to short pedicle length.  The conus medullaris is normal in signal and position.  Imaged intra-abdominal contents demonstrate a small left renal cyst.  The T10-11 and T11-12 levels are imaged in the sagittal plane only. There is mild disc bulging at each level without central canal or foraminal narrowing.  T12-L1:  Mild disc bulge without central canal or foraminal stenosis.  L1-2:  There is a disc bulge.  The central canal is mildly to moderately narrowed.  Foramina appear open.  L2-3:  Shallow disc bulge and ligamentum flavum thickening result in moderate central canal stenosis in conjunction with short pedicle length.  Foramina are open.  L3-4:  Moderate to moderately severe central canal stenosis is identified with a broad-based disc bulge eccentric to the right. The right foramen is mildly narrowed.  Left foramen is open.  L4-5:  The patient has advanced bilateral facet arthropathy with marrow edema about the right facet joint.  Ligamentum flavum is thickened and there is a broad-based diffuse disc bulge causing severe central canal and lateral recess stenosis.  Moderate to moderately severe foraminal narrowing appears worse on the right.  L5-S1:  Disc bulge and endplate spur are seen.  There is moderately severe central canal stenosis.  Moderately severe to severe foraminal  narrowing appears worse on the left.  IMPRESSION:  1.  Negative for diskitis or acute abnormality. 2.  Marked multilevel degenerative change appears worst at L4-5 where there is severe congenital and acquired central canal and lateral recess narrowing.  There is also a right worse than left foraminal narrowing at this level. 3.  Moderate to moderately severe congenital and acquired central canal stenosis L5-S1.  Marked bilateral foraminal narrowing at this level appears worse on the  left.   Original Report Authenticated By: Holley Dexter, M.D.     MEDICATIONS: Scheduled Meds: . amLODipine  10 mg Oral Daily  . calcium-vitamin D  1 tablet Oral BID WC  . clopidogrel  75 mg Oral Q breakfast  . [START ON 04/24/2013] cyanocobalamin  1,000 mcg Intramuscular Q30 days  . enoxaparin (LOVENOX) injection  30 mg Subcutaneous Q24H  . furosemide  40 mg Oral Q breakfast  . multivitamin with minerals  1 tablet Oral Daily  . potassium chloride  20 mEq Oral Daily  . sodium chloride  3 mL Intravenous Q12H   Continuous Infusions:  PRN Meds:.sodium chloride, acetaminophen, acetaminophen, alum & mag hydroxide-simeth, hydrALAZINE, ondansetron (ZOFRAN) IV, ondansetron, oxyCODONE, sodium chloride, zolpidem  Antibiotics: Anti-infectives   None       Jeoffrey Massed, MD  [facility] Pager:[phone]  If 7PM-7AM, please contact night-coverage [email or web address] Password [facility] 04/10/2013, 11:51 AM   LOS: 2 days

## 2013-04-10 NOTE — Clinical Social Work Psychosocial (Signed)
Clinical Social Work Department BRIEF PSYCHOSOCIAL ASSESSMENT 04/10/2013  Patient:  Connie Miranda, Connie Miranda     Account Number:  [id number]     Admit date:  04/08/2013  Clinical Social Worker:  Oswaldo Done  Date/Time:  04/10/2013 03:00 PM  Referred by:  Physician  Date Referred:  04/10/2013 Referred for  SNF Placement   Other Referral:   Interview type:   Other interview type:    PSYCHOSOCIAL DATA Living Status:  ALONE Admitted from facility:   Level of care:   Primary support name:  Kerry Dory Primary support relationship to patient:  FRIEND Degree of support available:   Limited    CURRENT CONCERNS Current Concerns  Post-Acute Placement   Other Concerns:    SOCIAL WORK ASSESSMENT / PLAN CSW met with patient at bedside, introduced self and role. Patient stating she went to [facility] after last hospital admission and stayed there through the end of March. Was told she had to leave because Medicare would no longer cover her stay. Patient went home with [facility]. Also, hired two ladies to stay with her at night. Patient understands physician is recommending SNF upon discharge and is agreeable to [facility] but does not want to return to [place]. Stating she will check with a friend to see who they recommend.   Assessment/plan status:  Information/Referral to [facility] Other assessment/ plan:   Information/referral to community resources:   SNF List    PATIENT'S/FAMILY'S RESPONSE TO PLAN OF CARE: Patiented thanked CSW for facilitating SNF bed search. Weekday CSW to follow up with bed offers.        Ricke Hey, [place] [phone] (weekend)

## 2013-04-10 NOTE — Evaluation (Signed)
Physical Therapy Evaluation Patient Details Name: Connie Miranda MRN: [id number] DOB: [birth year] Today's Date: 04/10/2013 Time: 1520-1550 PT Time Calculation (min): 30 min  PT Assessment / Plan / Recommendation Clinical Impression  79 yo presents with profound limitations to mobility, excerbated by obesity, edema and pain.  Recommend SNF level therapies with hope of returning to home eventually, expect will need ongoing 24 hour care.  Plan to initiate therapy acutely prior to d/c.    PT Assessment  Patient needs continued PT services    Follow Up Recommendations  SNF    Does the patient have the potential to tolerate intense rehabilitation      Barriers to Discharge        Equipment Recommendations  None recommended by PT    Recommendations for Other Services     Frequency Min 2X/week    Precautions / Restrictions Precautions Precautions: Fall Precaution Comments: caution with EOB activities Restrictions Other Position/Activity Restrictions: protect skin -- LE edema and incontinence   Pertinent Vitals/Pain Pain at left shoulder, right shoulder, right lateral calf      Mobility  Bed Mobility Bed Mobility: Rolling Right;Rolling Left;Supine to Sit;Sit to Supine;Scooting to [facility] Rolling Right: 1: +1 Total assist Rolling Left: 1: +1 Total assist Supine to Sit: 1: +1 Total assist;HOB elevated Sit to Supine: 1: +1 Total assist;HOB flat Scooting to HOB: 1: +2 Total assist (bed in trendelenberg) Scooting to [facility]: Patient Percentage: 0% Details for Bed Mobility Assistance: morbidly obese with lymphedema in bil legs limiting functional mobility; physical assist to perform all tasks with pain in joints and at wound on lateral right calf interfere with movement.  maximal verbal cues in addition for sequencing, optimal placement and safety.  achieved EOB, attempted to assist with scooting L/R, pt unable to adequately bear load in arms or legs to unweight hips, unable to advance.   required assist of additional person to repositon to [facility] once supine and to roll for pad adjustment Transfers Transfers: Not assessed Details for Transfer Assistance: unable to safely attempt sit to stand.  recommend use of lift equipment to assist with bodyweight supported trials    Exercises Other Exercises Other Exercises: in supine, internally rotate at hips, extend knees, hold 5 seconds.  repeat x5 every hour Other Exercises: alternate arms, extend at elbow, flex forward at shoulder to pain free range and slowly lower; repeat up to 5 times as able each hour Other Exercises: in supine, alternate roll left/right including lift head, shoulder, reach for rail as able.  repeat 1-3 times each hour as able.   PT Diagnosis: Generalized weakness  PT Problem List: Decreased strength;Decreased range of motion;Decreased activity tolerance;Decreased mobility;Obesity;Pain PT Treatment Interventions: Functional mobility training;Therapeutic activities;Patient/family education   PT Goals Acute Rehab PT Goals PT Goal Formulation: With patient Time For Goal Achievement: 04/24/13 Potential to Achieve Goals: Fair Pt will Roll Supine to Right Side: with mod assist;with rail PT Goal: Rolling Supine to Right Side - Progress: Goal set today Pt will Roll Supine to Left Side: with mod assist;with rail PT Goal: Rolling Supine to Left Side - Progress: Goal set today Pt will go Supine/Side to Sit: with mod assist PT Goal: Supine/Side to Sit - Progress: Goal set today Pt will Sit at Edge of Bed: with supervision;3-5 min;with no upper extremity support (perform LE ther ex) PT Goal: Sit at Edge Of Bed - Progress: Goal set today Pt will go Sit to Supine/Side: with mod assist;with HOB 0 degrees PT Goal: Sit to  Supine/Side - Progress: Goal set today Pt will go Sit to Stand: with upper extremity assist;with +1 total assist (using appropriate lift/assist for body weight support) PT Goal: Sit to Stand - Progress: Goal  set today Pt will go Stand to Sit: with +1 total assist;with upper extremity assist (using appropriate lift/assist for bodyweight support) PT Goal: Stand to Sit - Progress: Goal set today Pt will Stand: 1 - 2 min;with bilateral upper extremity support;with +1 total assist (using appropriate lift/assist equipment for bodywt support) PT Goal: Stand - Progress: Goal set today  Visit Information  Last PT Received On: 04/10/13 Assistance Needed: +2    Subjective Data  Subjective: I need a bar over my head to pull on Patient Stated Goal: walk   Prior Functioning  Home Living Lives With: Alone Available Help at Discharge: Home health;Personal care attendant Prior Function Level of Independence: Needs assistance Needs Assistance: Bathing;Dressing;Grooming;Toileting;Meal Prep;Light Housekeeping;Gait;Transfers Bath: Total Dressing: Maximal Grooming: Maximal Toileting: Total Meal Prep: Total Light Housekeeping: Total Gait Assistance: wheelchair bound with occassional use of RW Transfer Assistance: maximal Able to Take Stairs?: No Driving: No Comments: has HH agency and personal care 24 hours.  per pt she was minimally ambulatory, used RW when she could, mostly used w/d Communication Communication: No difficulties    Cognition  Cognition Overall Cognitive Status: Appears within functional limits for tasks assessed/performed Arousal/Alertness: Awake/alert Orientation Level: Appears intact for tasks assessed Behavior During Session: [facility] for tasks performed    Extremity/Trunk Assessment Right Upper Extremity Assessment RUE ROM/Strength/Tone: Deficits;Unable to fully assess RUE ROM/Strength/Tone Deficits: unable to demonstrate full elbow extension and shoulder flexion limited to less than 90 degrees. able to reach across body and grasp rail to assist with rolling in bed Left Upper Extremity Assessment LUE ROM/Strength/Tone: Deficits;Unable to fully assess LUE ROM/Strength/Tone  Deficits: pain limiting shoulder ROM similar to and worse than right side.  cannot reach across body to grasp rail Right Lower Extremity Assessment RLE ROM/Strength/Tone: Deficits RLE ROM/Strength/Tone Deficits: minimal hip strength limited to internal rotation in supine and partial extension of knee versus gravity in sitting Left Lower Extremity Assessment LLE ROM/Strength/Tone: Deficits LLE ROM/Strength/Tone Deficits: same as right side   Balance    End of Session PT - End of Session Activity Tolerance: Patient limited by fatigue;Patient limited by pain (pt falling asleep by end of session) Patient left: in bed;with call bell/phone within reach Nurse Communication: Mobility status;Need for lift equipment  GP     Dennis Bast 04/10/2013, 4:17 PM

## 2013-04-10 NOTE — Clinical Social Work Placement (Addendum)
Clinical Social Work Department CLINICAL SOCIAL WORK PLACEMENT NOTE 04/10/2013  Patient:  Connie Miranda, Connie Miranda  Account Number:  [id number] Admit date:  04/08/2013  Clinical Social Worker:  Oswaldo Done  Date/time:  04/10/2013 03:08 PM  Clinical Social Work is seeking post-discharge placement for this patient at the following level of care:   SKILLED NURSING   (*CSW will update this form in Epic as items are completed)   04/10/2013  Patient/family provided with [facility] Department of Clinical Social Work's list of facilities offering this level of care within the geographic area requested by the patient (or if unable, by the patient's family).  04/10/2013  Patient/family informed of their freedom to choose among providers that offer the needed level of care, that participate in Medicare, Medicaid or managed care program needed by the patient, have an available bed and are willing to accept the patient.  04/10/2013  Patient/family informed of [facility]' ownership interest in [facility], as well as of the fact that they are under no obligation to receive care at this facility.  PASARR submitted to EDS on 02/04/2013 PASARR number received from EDS on 02/04/2013  FL2 transmitted to all facilities in geographic area requested by pt/family on  04/10/2013 FL2 transmitted to all facilities within larger geographic area on   Patient informed that his/her managed care company has contracts with or will negotiate with  certain facilities, including the following:     Patient/family informed of bed offers received: 04/11/13   Patient chooses bed at [facility] Physician recommends and patient chooses bed at    Patient to be transferred to [facility] on 04/11/13   Patient to be transferred to facility by ambulance  The following physician request were entered in Epic:   Additional Comments: 04/11/13 - Discharge information forwarded to facility via [facility]. Patient medical packet  accompanied her to facility.   Ricke Hey, [place] [phone] (weekend)

## 2013-04-11 LAB — BASIC METABOLIC PANEL
CO2: 29 mEq/L (ref 19–32)
Calcium: 9.6 mg/dL (ref 8.4–10.5)
Creatinine, Ser: 0.84 mg/dL (ref 0.50–1.10)
Glucose, Bld: 101 mg/dL — ABNORMAL HIGH (ref 70–99)

## 2013-04-11 MED ORDER — ACETAMINOPHEN 325 MG PO TABS: 650.0000 mg | ORAL_TABLET | Freq: Four times a day (QID) | ORAL | Status: DC | PRN

## 2013-04-11 MED ORDER — OXYCODONE HCL 5 MG PO TABS: 5.0000 mg | ORAL_TABLET | ORAL | Status: DC | PRN

## 2013-04-11 MED ORDER — ZOLPIDEM TARTRATE 5 MG PO TABS: 5.0000 mg | ORAL_TABLET | Freq: Every evening | ORAL | Status: DC | PRN

## 2013-04-11 MED ORDER — POTASSIUM CHLORIDE CRYS ER 20 MEQ PO TBCR: 20.0000 meq | EXTENDED_RELEASE_TABLET | Freq: Every day | ORAL | Status: DC

## 2013-04-11 MED ORDER — FUROSEMIDE 40 MG PO TABS: 40.0000 mg | ORAL_TABLET | Freq: Every day | ORAL | Status: DC

## 2013-04-11 NOTE — Consult Note (Signed)
WOC consult Note Reason for Consult: Consult requested for right leg wound.  Pt states she has chronic edema and recently developed a blister which ruptured and evolved into a wound.  Wound type: Partial thickness stasis ulcer Measurement: 2.5 X 4 X .2 cm Wound bed: 100% red, yellow drainage, no odor Periwound: pink dried scar tissue, generalized edema surrounding wound. Dressing procedure/placement/frequency: foam dressing applied to healing and absorb drainage. Please re-consult if further assistance is needed.  Thank-you,  Cammie Mcgee MSN, RN, CWOCN, [place], CNS [phone]

## 2013-04-11 NOTE — Plan of Care (Signed)
Problem: Phase I Progression Outcomes Goal: Initial discharge plan identified Outcome: Completed/Met Date Met:  04/11/13 SNF for rehab.

## 2013-04-11 NOTE — Discharge Summary (Signed)
Physician Discharge Summary  Connie Miranda:[id number] DOB: [birth year] DOA: 04/08/2013  PCP: [name], MD  Admit date: 04/08/2013 Discharge date: 04/11/2013  Time spent: 45  minutes  Recommendations for Outpatient Follow-up:  1. Will need on-going physical therapy at rehab 2. Will need wound care  (for lymphedema in legs as well as right leg ulceration on calf)  Discharge Diagnoses:  Active Problems:   Hypertension   Lymphedema   Difficulty walking   Weakness of both legs   Morbid obesity   Discharge Condition:  Clear minded, stable  Diet recommendation: Heart Healthy  Filed Weights   04/09/13 1336 04/10/13 0821 04/11/13 0623  Weight: 123.378 kg (272 lb) 122.8 kg (270 lb 11.6 oz) 121 kg (266 lb 12.1 oz)    History of present illness:  Connie Miranda is a 79 y.o. female with a history of chronic lymphedema and recurrent cellulitis who presents to the ED with complaints of difficulty walking for the past 2 days and altered mental status.  She has not been able to stand up, and reports increased weakness in both legs. She denies having any fevers or chills syncope or falls. She was recently released from an SNF to home with home care.   Hospital Course:   Altered mental status  This was in the form of lethargy and somnolence when the patient arrived in the emergency room, not known what exactly the cause of this is. But suspect encephalopathy from Narcotic and Flexeril use (home medication list-shows patient on Flexeril and Norco). No white count or fever to suggest any underlying infectious process, chest x-ray negative for pneumonia and UA negative for UTI.   Now she is completely awake and alert.  For now minimize narcotics and sedating medications.  CT of the head on admission negative for CVA.    MRI brain negative for acute changes including CVA.  Lower extremity weakness with difficulty walking  Uncertain etiology she was confused and sedated from narcotic/benzos.    MRI of the lumbar sacral spine does not show any acute abnormality.   She does have mild left lower extremity weakness compared to the right lower extremity, however upon repeated asking she reports that this is her usual baseline as she had a stroke in 2001. She claims to have pain which is chronic on her left shoulder area-as a result she claims that her left upper arm is somewhat weak. Unfortunately per ED documentation patient has poor hygiene conditions at home and is virtually not livable.  Physical therapy recommends SNF for intensive rehab.  Hypertension  Moderately controlled with amlodipine.  Has chronic lower extremity lymphedema-will start low-dose diuretic therapy and slowly adjust. -please check lytes while at SNF  Lymphedema with chronic venous stasis  Currently this seems stable with no obvious discharge.  Started on Lasix and potassium supplementation.  History of CVA  Resume Plavix.   Morbid obesity  Counseled regarding the importance of weight loss   Discharge Exam: Filed Vitals:   04/10/13 1817 04/10/13 2255 04/11/13 0155 04/11/13 0623  BP: 154/56 153/71 147/71 161/77  Pulse: 81 91 82 76  Temp: 98.6 F (37 C) 98.8 F (37.1 C) 98.4 F (36.9 C) 99 F (37.2 C)  TempSrc: Oral Oral Oral Oral  Resp: 20 18 18 18   Height:      Weight:    121 kg (266 lb 12.1 oz)  SpO2: 95% 93% 95% 91%    General: A&O, NAD, Obese, Lying comfortably in bed.  Cardiovascular:  rrr no m/r/g (Difficult to hear due to body habitus)  Respiratory: CTA no w/c/r, no accessory muscle use  (Difficult to hear due to body habitus)  Abdomen:  Soft, nt, nd, +BS, no masses Lower extremities:  Significant edema with severe skin changes from chronic lymphedema.  Right lower extremity tender to palpation Left upper extremity significantly weaker than right.  Discharge Instructions      Discharge Orders   Future Orders Complete By Expires     Diet - low sodium heart healthy  As directed      Increase activity slowly  As directed         Medication List    STOP taking these medications       cyclobenzaprine 10 MG tablet  Commonly known as:  FLEXERIL     HYDROcodone-acetaminophen 5-325 MG per tablet  Commonly known as:  NORCO/VICODIN      TAKE these medications       acetaminophen 325 MG tablet  Commonly known as:  TYLENOL  Take 2 tablets (650 mg total) by mouth every 6 (six) hours as needed.     amLODipine 10 MG tablet  Commonly known as:  NORVASC  Take 1 tablet (10 mg total) by mouth daily.     Biotin 1000 MCG tablet  Take 1 tablet (1 mg total) by mouth daily.     CALCIUM + D PO  Take 1 tablet by mouth daily.     clopidogrel 75 MG tablet  Commonly known as:  PLAVIX  Take 1 tablet (75 mg total) by mouth daily.     cyanocobalamin 1000 MCG/ML injection  Commonly known as:  (VITAMIN B-12)  Inject 1 mL (1,000 mcg total) into the muscle every 30 (thirty) days.     furosemide 40 MG tablet  Commonly known as:  LASIX  Take 1 tablet (40 mg total) by mouth daily with breakfast.     mineral oil-hydrophilic petrolatum ointment  Apply 1 application topically daily. To lower extremities     multivitamin with minerals Tabs  Take 1 tablet by mouth daily.     oxyCODONE 5 MG immediate release tablet  Commonly known as:  Oxy IR/ROXICODONE  Take 1 tablet (5 mg total) by mouth every 4 (four) hours as needed.     potassium chloride SA 20 MEQ tablet  Commonly known as:  K-DUR,KLOR-CON  Take 1 tablet (20 mEq total) by mouth daily.     zolpidem 5 MG tablet  Commonly known as:  AMBIEN  Take 1 tablet (5 mg total) by mouth at bedtime as needed for sleep (insomnia).       Follow-up Information   Follow up with [name], MD.   Contact information:   [address] [place] [address] [phone]        The results of significant diagnostics from this hospitalization (including imaging, microbiology, ancillary and laboratory) are listed below for  reference.    Significant Diagnostic Studies: Dg Chest 2 View  04/08/2013  *RADIOLOGY REPORT*  Clinical Data: Fever, altered mental status  CHEST - 2 VIEW  Comparison: 01/28/2013  Findings: Lungs are essentially clear.  No pleural effusion or pneumothorax.  The heart is top normal in size.  Degenerative changes of the visualized thoracolumbar spine.  IMPRESSION: No evidence of acute cardiopulmonary disease.   Original Report Authenticated By: Charline Bills, M.D.    Dg Lumbar Spine Complete  04/08/2013  *RADIOLOGY REPORT*  Clinical Data: Low back pain and weakness.  LUMBAR SPINE -  COMPLETE 4+ VIEW  Comparison: None.  Findings: No evidence of acute fracture, spondylolysis, or spondylolisthesis.  Moderate to severe degenerative disc disease is seen in all lumbar levels.  Bilateral facet DJD seen at L4-5 and L5-S1, right side greater than left.  No focal lytic or sclerotic bone lesions identified.  IUD noted in the pelvis.  IMPRESSION:  1.  No acute findings. 2.  Moderate to severe degenerative spondylosis, as described above. 3.  Pelvic IUD incidentally noted.   Original Report Authenticated By: Myles Rosenthal, M.D.    Ct Head Wo Contrast  04/08/2013  *RADIOLOGY REPORT*  Clinical Data: 79 year old female with difficulty walking and altered mental status.  CT HEAD WITHOUT CONTRAST  Technique:  Contiguous axial images were obtained from the base of the skull through the vertex without contrast.  Comparison: None.  Findings: Minimal paranasal sinus mucosal thickening.  Mastoids are clear. No acute osseous abnormality identified.  Visualized orbits and scalp soft tissues are within normal limits.  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  Atlantodens interval appears normal.  Chronic left corona radiata/basal ganglia lacunar infarct.  Patchy white matter hypodensity elsewhere. Cerebral volume is within normal limits for age.  No midline shift, mass effect, or evidence of  mass lesion.  No evidence of cortically based acute infarction identified.  No acute intracranial hemorrhage identified.  No suspicious intracranial vascular hyperdensity. No midline shift, mass effect, or evidence of mass lesion.  IMPRESSION: Chronic small vessel ischemia. No acute intracranial abnormality.   Original Report Authenticated By: Erskine Speed, M.D.    Mr Brain Wo Contrast  04/10/2013  *RADIOLOGY REPORT*  Clinical Data:  Mild left-sided weakness.  Question new versus old.History of hypertension.  History of morbid obesity.  MRI HEAD WITHOUT CONTRAST  Technique:  Multiplanar, multiecho pulse sequences of the brain and surrounding structures were obtained according to standard protocol without intravenous contrast.  Comparison: CT head 04/08/2013.  Findings: No acute stroke or bleed.  No mass lesion or hydrocephalus.  Moderate atrophy with extensive chronic microvascular ischemic change and remote lacunar infarcts.  Gross patency of the carotid and basilar arteries.  Partial empty sella. No tonsillar herniation.  Mild cervical spondylosis.  No worrisome osseous lesions.  Right-sided nasal turbinate hypertrophy.  Chronic sinus disease.  No acute mastoid fluid.  IMPRESSION: No acute stroke or intracranial hemorrhage.  Chronic changes as described.  Similar appearance to prior CT.   Original Report Authenticated By: Davonna Belling, M.D.    Mr Lumbar Spine W Wo Contrast  04/09/2013  *RADIOLOGY REPORT*  Clinical Data: Difficulty walking for 2 days with worsening bilateral lower extremity weakness.  Negative for fever, chills for trauma.  MRI LUMBAR SPINE WITHOUT AND WITH CONTRAST  Technique:  Multiplanar and multiecho pulse sequences of the lumbar spine were obtained without and with intravenous contrast.  Contrast: 20mL MULTIHANCE GADOBENATE DIMEGLUMINE 529 MG/ML IV SOLN  Comparison: Plain films 04/08/2013.  CT abdomen and pelvis 10/11/2012.  Findings: The study is limited by the patient's body habitus and  motion on the axial sequences.  Vertebral body height and alignment are maintained.  There is marked multilevel degenerative endplate signal change correlating with endplate sclerosis seen on the patient's CT scan.  The patient has a congenitally narrow central canal due to short pedicle length.  The conus medullaris is normal in signal and position.  Imaged intra-abdominal contents demonstrate a small left renal cyst.  The T10-11 and T11-12 levels are imaged in  the sagittal plane only. There is mild disc bulging at each level without central canal or foraminal narrowing.  T12-L1:  Mild disc bulge without central canal or foraminal stenosis.  L1-2:  There is a disc bulge.  The central canal is mildly to moderately narrowed.  Foramina appear open.  L2-3:  Shallow disc bulge and ligamentum flavum thickening result in moderate central canal stenosis in conjunction with short pedicle length.  Foramina are open.  L3-4:  Moderate to moderately severe central canal stenosis is identified with a broad-based disc bulge eccentric to the right. The right foramen is mildly narrowed.  Left foramen is open.  L4-5:  The patient has advanced bilateral facet arthropathy with marrow edema about the right facet joint.  Ligamentum flavum is thickened and there is a broad-based diffuse disc bulge causing severe central canal and lateral recess stenosis.  Moderate to moderately severe foraminal narrowing appears worse on the right.  L5-S1:  Disc bulge and endplate spur are seen.  There is moderately severe central canal stenosis.  Moderately severe to severe foraminal narrowing appears worse on the left.  IMPRESSION:  1.  Negative for diskitis or acute abnormality. 2.  Marked multilevel degenerative change appears worst at L4-5 where there is severe congenital and acquired central canal and lateral recess narrowing.  There is also a right worse than left foraminal narrowing at this level. 3.  Moderate to moderately severe congenital and  acquired central canal stenosis L5-S1.  Marked bilateral foraminal narrowing at this level appears worse on the left.   Original Report Authenticated By: Holley Dexter, M.D.     Labs: Basic Metabolic Panel:  Recent Labs Lab 04/08/13 1616 04/08/13 2351 04/09/13 0700 04/10/13 0830 04/11/13 0645  NA 139  --  140 140 137  K 3.5  --  3.4* 3.9 4.6  CL 101  --  105 104 102  CO2 31  --  29 30 29   GLUCOSE 110*  --  93 114* 101*  BUN 21  --  17 13 10   CREATININE 0.99 0.90 0.90 0.91 0.84  CALCIUM 10.5  --  9.6 9.5 9.6   CBC:  Recent Labs Lab 04/08/13 1616 04/08/13 2351 04/09/13 0700  WBC 9.4 8.1 6.2  NEUTROABS 6.8  --   --   HGB 10.7* 9.7* 9.1*  HCT 32.6* 29.6* 27.7*  MCV 91.8 91.4 90.8  PLT 286 253 241     Signed:  Conley Canal  [facility] 04/11/2013, 10:17 AM   Attending Patient seen and examined, agree with the assessment and plan as outlined above. She is awake and alert, she is stable to be discharged to a SNF today.MRI Brain neg for CVA  S Mohamadou Maciver

## 2013-04-11 NOTE — Progress Notes (Signed)
Connie Miranda discharged Skilled nursing facility per MD order.  Report called to receiving Connie Coombs, LPN at [facility].     Medication List    STOP taking these medications       cyclobenzaprine 10 MG tablet  Commonly known as:  FLEXERIL     HYDROcodone-acetaminophen 5-325 MG per tablet  Commonly known as:  NORCO/VICODIN      TAKE these medications       acetaminophen 325 MG tablet  Commonly known as:  TYLENOL  Take 2 tablets (650 mg total) by mouth every 6 (six) hours as needed.     amLODipine 10 MG tablet  Commonly known as:  NORVASC  Take 1 tablet (10 mg total) by mouth daily.     Biotin 1000 MCG tablet  Take 1 tablet (1 mg total) by mouth daily.     CALCIUM + D PO  Take 1 tablet by mouth daily.     clopidogrel 75 MG tablet  Commonly known as:  PLAVIX  Take 1 tablet (75 mg total) by mouth daily.     cyanocobalamin 1000 MCG/ML injection  Commonly known as:  (VITAMIN B-12)  Inject 1 mL (1,000 mcg total) into the muscle every 30 (thirty) days.     furosemide 40 MG tablet  Commonly known as:  LASIX  Take 1 tablet (40 mg total) by mouth daily with breakfast.     mineral oil-hydrophilic petrolatum ointment  Apply 1 application topically daily. To lower extremities     multivitamin with minerals Tabs  Take 1 tablet by mouth daily.     oxyCODONE 5 MG immediate release tablet  Commonly known as:  Oxy IR/ROXICODONE  Take 1 tablet (5 mg total) by mouth every 4 (four) hours as needed.     potassium chloride SA 20 MEQ tablet  Commonly known as:  K-DUR,KLOR-CON  Take 1 tablet (20 mEq total) by mouth daily.     zolpidem 5 MG tablet  Commonly known as:  AMBIEN  Take 1 tablet (5 mg total) by mouth at bedtime as needed for sleep (insomnia).        Patients skin is clean, dry, right calf ulcer with foam dressing & several small areas to sacrum.. IV site discontinued and catheter remains intact. Site without signs and symptoms of complications. Dressing and pressure  applied.  Patient transported on a stretcher by non emergent EMS,  no distress noted upon discharge.  Connie Miranda, Connie Miranda 04/11/2013 4:12 PM

## 2013-04-11 NOTE — Plan of Care (Signed)
Problem: Phase I Progression Outcomes Goal: OOB as tolerated unless otherwise ordered Outcome: Not Met (add Reason) Sat on side of bed with PT, going to SNF for rehab  Problem: Phase II Progression Outcomes Goal: Progress activity as tolerated unless otherwise ordered Outcome: Not Met (add Reason) Going to SNF for rehab Goal: Discharge plan established Outcome: Completed/Met Date Met:  04/11/13 Going to SNF for rehab  Problem: Phase III Progression Outcomes Goal: Activity at appropriate level-compared to baseline (UP IN CHAIR FOR HEMODIALYSIS)  Outcome: Not Met (add Reason) Going to SNF for rehab

## 2013-04-12 NOTE — ED Provider Notes (Signed)
Medical screening examination/treatment/procedure(s) were performed by non-physician practitioner and as supervising physician I was immediately available for consultation/collaboration.   Benny Lennert, MD 04/12/13 [phone]

## 2013-07-05 ENCOUNTER — Other Ambulatory Visit: Payer: Self-pay | Admitting: Geriatric Medicine

## 2016-01-30 DIAGNOSIS — R600 Localized edema: Secondary | ICD-10-CM

## 2016-01-30 HISTORY — DX: Localized edema: R60.0

## 2016-02-11 ENCOUNTER — Encounter (HOSPITAL_COMMUNITY): Payer: Self-pay

## 2016-02-11 ENCOUNTER — Emergency Department (HOSPITAL_COMMUNITY): Payer: Medicare Other

## 2016-02-11 ENCOUNTER — Emergency Department (EMERGENCY_DEPARTMENT_HOSPITAL): Admit: 2016-02-11 | Discharge: 2016-02-11 | Disposition: A | Discharge status: Home / Self Care | Payer: Medicare Other

## 2016-02-11 ENCOUNTER — Observation Stay (HOSPITAL_COMMUNITY)
Admission: EM | Admit: 2016-02-11 | Discharge: 2016-02-13 | Disposition: A | Discharge status: Home Health | Payer: Medicare Other | Attending: Internal Medicine | Admitting: Internal Medicine

## 2016-02-11 DIAGNOSIS — Z880 Allergy status to penicillin: Secondary | ICD-10-CM | POA: Diagnosis not present

## 2016-02-11 DIAGNOSIS — R29898 Other symptoms and signs involving the musculoskeletal system: Secondary | ICD-10-CM | POA: Diagnosis not present

## 2016-02-11 DIAGNOSIS — Z8673 Personal history of transient ischemic attack (TIA), and cerebral infarction without residual deficits: Secondary | ICD-10-CM | POA: Insufficient documentation

## 2016-02-11 DIAGNOSIS — M170 Bilateral primary osteoarthritis of knee: Secondary | ICD-10-CM | POA: Diagnosis not present

## 2016-02-11 DIAGNOSIS — I10 Essential (primary) hypertension: Secondary | ICD-10-CM | POA: Diagnosis present

## 2016-02-11 DIAGNOSIS — G8929 Other chronic pain: Secondary | ICD-10-CM | POA: Insufficient documentation

## 2016-02-11 DIAGNOSIS — Z993 Dependence on wheelchair: Secondary | ICD-10-CM | POA: Insufficient documentation

## 2016-02-11 DIAGNOSIS — I890 Lymphedema, not elsewhere classified: Secondary | ICD-10-CM | POA: Insufficient documentation

## 2016-02-11 DIAGNOSIS — Z7902 Long term (current) use of antithrombotics/antiplatelets: Secondary | ICD-10-CM | POA: Insufficient documentation

## 2016-02-11 DIAGNOSIS — Z87891 Personal history of nicotine dependence: Secondary | ICD-10-CM | POA: Insufficient documentation

## 2016-02-11 DIAGNOSIS — R531 Weakness: Secondary | ICD-10-CM | POA: Diagnosis present

## 2016-02-11 DIAGNOSIS — M160 Bilateral primary osteoarthritis of hip: Secondary | ICD-10-CM | POA: Diagnosis not present

## 2016-02-11 DIAGNOSIS — I160 Hypertensive urgency: Secondary | ICD-10-CM | POA: Diagnosis present

## 2016-02-11 DIAGNOSIS — M25562 Pain in left knee: Secondary | ICD-10-CM | POA: Diagnosis not present

## 2016-02-11 DIAGNOSIS — D550 Anemia due to glucose-6-phosphate dehydrogenase [G6PD] deficiency: Secondary | ICD-10-CM | POA: Insufficient documentation

## 2016-02-11 DIAGNOSIS — D75A Glucose-6-phosphate dehydrogenase (G6PD) deficiency without anemia: Secondary | ICD-10-CM | POA: Diagnosis present

## 2016-02-11 HISTORY — DX: Localized edema: R60.0

## 2016-02-11 LAB — CBC WITH DIFFERENTIAL/PLATELET
BASOS ABS: 0 10*3/uL (ref 0.0–0.1)
BASOS PCT: 0 %
EOS PCT: 4 %
Eosinophils Absolute: 0.3 10*3/uL (ref 0.0–0.7)
HCT: 40.9 % (ref 36.0–46.0)
Hemoglobin: 12.7 g/dL (ref 12.0–15.0)
LYMPHS PCT: 15 %
Lymphs Abs: 1.2 10*3/uL (ref 0.7–4.0)
MCH: 29.8 pg (ref 26.0–34.0)
MCHC: 31.1 g/dL (ref 30.0–36.0)
MCV: 96 fL (ref 78.0–100.0)
Monocytes Absolute: 0.3 10*3/uL (ref 0.1–1.0)
Monocytes Relative: 4 %
NEUTROS ABS: 6.3 10*3/uL (ref 1.7–7.7)
Neutrophils Relative %: 77 %
PLATELETS: 252 10*3/uL (ref 150–400)
RBC: 4.26 MIL/uL (ref 3.87–5.11)
RDW: 13.4 % (ref 11.5–15.5)
WBC: 8.2 10*3/uL (ref 4.0–10.5)

## 2016-02-11 LAB — COMPREHENSIVE METABOLIC PANEL
ALBUMIN: 3 g/dL — AB (ref 3.5–5.0)
ALT: 16 U/L (ref 14–54)
AST: 20 U/L (ref 15–41)
Alkaline Phosphatase: 88 U/L (ref 38–126)
Anion gap: 9 (ref 5–15)
BUN: 10 mg/dL (ref 6–20)
CHLORIDE: 107 mmol/L (ref 101–111)
CO2: 24 mmol/L (ref 22–32)
CREATININE: 0.9 mg/dL (ref 0.44–1.00)
Calcium: 10 mg/dL (ref 8.9–10.3)
GFR calc Af Amer: >60 mL/min (ref >60)
GFR, EST NON AFRICAN AMERICAN: 59 mL/min — AB (ref >60)
GLUCOSE: 100 mg/dL — AB (ref 65–99)
POTASSIUM: 3.6 mmol/L (ref 3.5–5.1)
Sodium: 140 mmol/L (ref 135–145)
Total Bilirubin: 0.5 mg/dL (ref 0.3–1.2)
Total Protein: 7.5 g/dL (ref 6.5–8.1)

## 2016-02-11 LAB — BRAIN NATRIURETIC PEPTIDE: B Natriuretic Peptide: 38 pg/mL (ref 0.0–100.0)

## 2016-02-11 LAB — TROPONIN I

## 2016-02-11 LAB — CK: Total CK: 33 U/L — ABNORMAL LOW (ref 38–234)

## 2016-02-11 MED ORDER — POTASSIUM CHLORIDE CRYS ER 20 MEQ PO TBCR
20.0000 meq | EXTENDED_RELEASE_TABLET | Freq: Every day | ORAL | Status: DC
  Administered 2016-02-12 – 2016-02-13 (×2): 20 meq via ORAL
  Filled 2016-02-11 (×2): qty 1

## 2016-02-11 MED ORDER — ADULT MULTIVITAMIN W/MINERALS CH
1.0000 | ORAL_TABLET | Freq: Every day | ORAL | Status: DC
  Administered 2016-02-12 – 2016-02-13 (×2): 1 via ORAL
  Filled 2016-02-11 (×2): qty 1

## 2016-02-11 MED ORDER — BIOTIN 1000 MCG PO TABS: 1000.0000 ug | ORAL_TABLET | Freq: Every day | ORAL | Status: DC

## 2016-02-11 MED ORDER — AMLODIPINE BESYLATE 10 MG PO TABS
10.0000 mg | ORAL_TABLET | Freq: Every day | ORAL | Status: DC
  Administered 2016-02-12 – 2016-02-13 (×2): 10 mg via ORAL
  Filled 2016-02-11 (×2): qty 1

## 2016-02-11 MED ORDER — ENOXAPARIN SODIUM 40 MG/0.4ML ~~LOC~~ SOLN
40.0000 mg | SUBCUTANEOUS | Status: DC
  Administered 2016-02-12 – 2016-02-13 (×2): 40 mg via SUBCUTANEOUS
  Filled 2016-02-11 (×2): qty 0.4

## 2016-02-11 MED ORDER — CLOPIDOGREL BISULFATE 75 MG PO TABS
75.0000 mg | ORAL_TABLET | Freq: Every day | ORAL | Status: DC
  Administered 2016-02-12 – 2016-02-13 (×2): 75 mg via ORAL
  Filled 2016-02-11 (×2): qty 1

## 2016-02-11 MED ORDER — FUROSEMIDE 40 MG PO TABS
40.0000 mg | ORAL_TABLET | Freq: Every day | ORAL | Status: DC
  Administered 2016-02-12 – 2016-02-13 (×2): 40 mg via ORAL
  Filled 2016-02-11 (×2): qty 1

## 2016-02-11 MED ORDER — OXYCODONE HCL 5 MG PO TABS
5.0000 mg | ORAL_TABLET | ORAL | Status: DC | PRN
  Administered 2016-02-12: 5 mg via ORAL
  Filled 2016-02-11: qty 1

## 2016-02-11 MED ORDER — SENNOSIDES-DOCUSATE SODIUM 8.6-50 MG PO TABS: 1.0000 | ORAL_TABLET | Freq: Every evening | ORAL | Status: DC | PRN

## 2016-02-11 MED ORDER — ZOLPIDEM TARTRATE 5 MG PO TABS: 5.0000 mg | ORAL_TABLET | Freq: Every evening | ORAL | Status: DC | PRN

## 2016-02-11 MED ORDER — CYANOCOBALAMIN 1000 MCG/ML IJ SOLN: 1000.0000 ug | INTRAMUSCULAR | Status: DC

## 2016-02-11 NOTE — Care Management (Signed)
Discussed Home Health patient and family is agreeable will set up [facility]

## 2016-02-11 NOTE — H&P (Addendum)
[facility] History and Physical  Connie Miranda ZOX:[id number] DOB: [birth year] DOA: 02/11/2016  Referring physician: ER physician. PCP: [name], MD  Specialists: None.  Chief Complaint: Weakness of the lower extremities.  HPI: Connie Miranda is a 82 y.o. female with history of chronic lymphedema, pemphigus, hypertension and previous stroke presents to the ER because of increasing weakness to ambulate. Patient has morbid obesity and states she usually is ambulatory with help of walker. Also uses wheelchair. But over the last 1 week patient is finding it increasingly difficult to ambulate. Denies any fall. On exam patient has significant lower extremity edema which patient states is chronic. Dopplers were negative for DVT. On exam patient has significant pain of the lower extremity on moving. Patient CT head is negative for anything acute. Patient will be admitted for further observation. Possibly may need rehabilitation.   Review of Systems: As presented in the history of presenting illness, rest negative.  Past Medical History  Diagnosis Date  . Hypertension   . Stroke [facility]) 09/2000    couldn't move her left foot which resolved  . Edema   . Pernicious anemia   . Glucose-6-phosphate dehydrogenase deficiency (HCC)     possible diagnosis of G6PD per patient "phosphate 6 deficiency"  . Osteoarthritis    Past Surgical History  Procedure Laterality Date  . Other surgical history      cyst from panty line removed as child  . Bone marrow biopsy  12/1966    for anemia   Social History:  reports that she quit smoking about 22 years ago. Her smoking use included Cigarettes. She does not have any smokeless tobacco history on file. She reports that she does not drink alcohol. Her drug history is not on file. Where does patient live home. Can patient participate in ADLs? Yes.  Allergies  Allergen Reactions  . Asa [Aspirin] Other (See Comments)    Doctor told her not to take  due to type anemia  . Phosphate Other (See Comments)    Anything containing phosphate 6. Unknown reaction  . Codeine Other (See Comments)    Made her too sleepy  . Penicillins Itching and Swelling    Family History:  Family History  Problem Relation Age of Onset  . Diabetes Brother   . Diabetes Brother   . Heart failure Mother 45  . Heart attack Father 48      Prior to Admission medications   Medication Sig Start Date End Date Taking? Authorizing Provider  acetaminophen (TYLENOL) 325 MG tablet Take 2 tablets (650 mg total) by mouth every 6 (six) hours as needed. 04/11/13  Yes Marianne L York, PA-C  amLODipine (NORVASC) 10 MG tablet Take 1 tablet (10 mg total) by mouth daily. 03/24/13  Yes Sharon Seller, NP  Biotin 1000 MCG tablet Take 1 tablet (1 mg total) by mouth daily. 03/24/13  Yes Sharon Seller, NP  Calcium Carbonate-Vitamin D (CALCIUM + D PO) Take 1 tablet by mouth daily.   Yes Historical Provider, MD  clopidogrel (PLAVIX) 75 MG tablet Take 1 tablet (75 mg total) by mouth daily. 03/24/13  Yes Sharon Seller, NP  cyanocobalamin (,VITAMIN B-12,) 1000 MCG/ML injection Inject 1 mL (1,000 mcg total) into the muscle every 30 (thirty) days. 03/24/13  Yes Sharon Seller, NP  furosemide (LASIX) 40 MG tablet Take 1 tablet (40 mg total) by mouth daily with breakfast. 04/11/13  Yes Stephani Police, PA-C  mineral oil-hydrophilic petrolatum (AQUAPHOR) ointment Apply  1 application topically daily. To lower extremities 03/24/13  Yes Sharon Seller, NP  Multiple Vitamin (MULTIVITAMIN WITH MINERALS) TABS Take 1 tablet by mouth daily. 03/24/13  Yes Sharon Seller, NP  oxyCODONE (OXY IR/ROXICODONE) 5 MG immediate release tablet Take 1 tablet (5 mg total) by mouth every 4 (four) hours as needed. 04/11/13  Yes Tora Kindred York, PA-C  potassium chloride SA (K-DUR,KLOR-CON) 20 MEQ tablet Take 1 tablet (20 mEq total) by mouth daily. 04/11/13  Yes Tora Kindred York, PA-C  zolpidem (AMBIEN) 5 MG  tablet Take 1 tablet (5 mg total) by mouth at bedtime as needed for sleep (insomnia). 04/11/13  Yes Stephani Police, PA-C    Physical Exam: Filed Vitals:   02/11/16 1536 02/11/16 1700 02/11/16 1900  BP: 204/71 124/73 148/95  Pulse: 61 65 64  Temp: 97.5 F (36.4 C)    TempSrc: Oral    Resp: 20 21 16   SpO2: 98% 95% 94%     General:  Moderately built and nourished.  Eyes: Anicteric. No pallor.  ENT: No discharge from the ears eyes nose or mouth.  Neck: No mass felt.  Cardiovascular: S1 and S2 heard.  Respiratory: No rhonchi or crepitations.  Abdomen: Soft nontender bowel sounds present.  Skin: Skin changes left lower extremity which patient states is chronic.  Musculoskeletal: Bilateral lower extremity edema.  Psychiatric: Appears normal.  Neurologic: Alert awake oriented to time place and person. Patient has minimal strength and moving lower extremities also restricted by painful joints. Able to move upper extremities without any difficulty.  Labs on Admission:  Basic Metabolic Panel:  Recent Labs Lab 02/11/16 1550  NA 140  K 3.6  CL 107  CO2 24  GLUCOSE 100*  BUN 10  CREATININE 0.90  CALCIUM 10.0   Liver Function Tests:  Recent Labs Lab 02/11/16 1550  AST 20  ALT 16  ALKPHOS 88  BILITOT 0.5  PROT 7.5  ALBUMIN 3.0*   No results for input(s): LIPASE, AMYLASE in the last 168 hours. No results for input(s): AMMONIA in the last 168 hours. CBC:  Recent Labs Lab 02/11/16 1550  WBC 8.2  NEUTROABS 6.3  HGB 12.7  HCT 40.9  MCV 96.0  PLT 252   Cardiac Enzymes:  Recent Labs Lab 02/11/16 1550  CKTOTAL 33*  TROPONINI <0.03    BNP (last 3 results)  Recent Labs  02/11/16 1550  BNP 38.0    ProBNP (last 3 results) No results for input(s): PROBNP in the last 8760 hours.  CBG: No results for input(s): GLUCAP in the last 168 hours.  Radiological Exams on Admission: Dg Chest 2 View  02/11/2016  CLINICAL DATA:  Weakness and back pain for  1 day, history hypertension, stroke, former smoker EXAM: CHEST  2 VIEW COMPARISON:  04/08/2013 FINDINGS: Enlargement of cardiac silhouette. Mediastinal contours and pulmonary vascularity normal. Lungs clear. No pleural effusion or pneumothorax. Advanced arthritic process at the shoulders bilaterally. Significant diffuse osseous demineralization. IMPRESSION: Enlargement of cardiac silhouette without acute infiltrate. Electronically Signed   By: Ulyses Southward M.D.   On: 02/11/2016 16:54   Ct Head Wo Contrast  02/11/2016  CLINICAL DATA:  82 year old female with generalized weakness EXAM: CT HEAD WITHOUT CONTRAST TECHNIQUE: Contiguous axial images were obtained from the base of the skull through the vertex without intravenous contrast. COMPARISON:  Most recent prior head CT 04/08/2013; brain MRI 04/10/2013 FINDINGS: Negative for acute intracranial hemorrhage, acute infarction, mass, mass effect, hydrocephalus or midline shift. Gray-white differentiation is  preserved throughout. Stable appearance of periventricular, subcortical and deep white matter hypoattenuation most consistent with chronic microvascular ischemic white matter disease. Cerebral cortical atrophy commensurate with age. Ex vacuo dilatation of the lateral ventricles similar compared to prior and likely related to central atrophy. No focal soft tissue or calvarial abnormality. Globes and orbits symmetric bilaterally. Normal aeration mastoid air cells and visualized paranasal sinuses. IMPRESSION: 1. No acute intracranial abnormality. 2. Stable chronic microvascular ischemic white matter disease and cerebral cortical atrophy. Electronically Signed   By: Malachy Moan M.D.   On: 02/11/2016 20:06    EKG: Independently reviewed - normal sinus rhythm. With first-degree AV block.  Assessment/Plan Principal Problem:   Weakness of both legs Active Problems:   Hypertension   Glucose-6-phosphate dehydrogenase deficiency (HCC)   Morbid obesity (HCC)    Lower extremity weakness   1. Bilateral lower extremity weakness - MRI brain and MRI L spine is pending. According patient's weakness may be related to her arthritis. Patient has significant pain on moving her joints. Will check uric acid levels for gout. Get physical therapy consult. Patient probably will need rehabilitation placement. Difficult to elicit deep tendon reflexes at this time. Patient denies any incontinence of urine. UA is pending. Will check x-rays of the knee and hip. 2. Hypertension - we'll continue home medications. 3. Morbid obesity. 4. Chronic lymphedema - Dopplers were negative for DVT.   DVT Prophylaxis Lovenox.  Code Status: Full code.  Family Communication: Discussed with patient.  Disposition Plan: Admit for observation.    Andersyn Fragoso N. [facility] Pager [phone].  If 7PM-7AM, please contact night-coverage [email or web address] Password [facility] 02/11/2016, 9:49 PM

## 2016-02-11 NOTE — ED Notes (Signed)
Pt unable to ambulate. Wheelchair-bound at home.

## 2016-02-11 NOTE — ED Notes (Signed)
Attempted to place pt on bedpan for urine sample, pt did not use restroom

## 2016-02-11 NOTE — ED Notes (Signed)
Pt refusing in and out cath. This RN explained necessity for in and out cath, pt still refuses stating "I haven't been having any issues down there." Pt denies GU symptoms.

## 2016-02-11 NOTE — ED Provider Notes (Signed)
CSN: [id number]     Arrival date & time 02/11/16  1520 History   First MD Initiated Contact with Patient 02/11/16 1521     Chief Complaint  Patient presents with  . Leg Swelling   HPI Comments: Connie Miranda is a 82 y.o. Female that presents to the ED via EMS.  She reports that she was sent to the ED by her brother, who was worried about her health.  She notes that she has had generalized weakness, requiring an aid to assist her get around the house.  She has, however, not been able to even get around with an assistive device for about the last week.  She uses a Programmer, multimedia and sometimes a wheelchair.  She does report to my attending that she was getting around with assistance up until a few days ago.  She has been unable to do this lately, though cannot identify any particular event that cause her sudden inability to get around.  She has chronic venous stasis.  She notes that she takes medication to keep swelling down.  Per caregiver report, her legs are baseline.  She denies dizziness, changes in sensation, falls since November.  Denies CP, SOB, abdominal pain, nausea or vomiting.  She is incontinent of urine and feces, this appears to be a chronic issue for her.  She is dependent of her ADLs.  The history is provided by the patient. No language interpreter was used.    Past Medical History  Diagnosis Date  . Hypertension   . Stroke [facility]) 09/2000    couldn't move her left foot which resolved  . Edema   . Pernicious anemia   . Glucose-6-phosphate dehydrogenase deficiency (HCC)     possible diagnosis of G6PD per patient "phosphate 6 deficiency"  . Osteoarthritis    Past Surgical History  Procedure Laterality Date  . Other surgical history      cyst from panty line removed as child  . Bone marrow biopsy  12/1966    for anemia   Family History  Problem Relation Age of Onset  . Diabetes Brother   . Diabetes Brother   . Heart failure Mother 76  . Heart attack Father 76   Social  History  Substance Use Topics  . Smoking status: Former Smoker    Types: Cigarettes    Quit date: 12/29/1993  . Smokeless tobacco: None  . Alcohol Use: No   OB History    No data available     Review of Systems  Constitutional: Positive for activity change. Negative for fever, chills and appetite change.  Eyes: Negative for visual disturbance.  Respiratory: Negative for cough, shortness of breath and wheezing.   Cardiovascular: Positive for leg swelling. Negative for chest pain and palpitations.  Gastrointestinal: Negative for nausea, vomiting, abdominal pain, diarrhea and constipation.  Genitourinary: Positive for difficulty urinating (incontinent of urine). Negative for dysuria.  Musculoskeletal: Positive for arthralgias (b/l knee pain).  Neurological: Positive for weakness. Negative for dizziness, light-headedness and headaches.  Psychiatric/Behavioral: Negative for confusion.   Allergies  Asa; Codeine; Penicillins; and Phosphate  Home Medications   Prior to Admission medications   Medication Sig Start Date End Date Taking? Authorizing Provider  acetaminophen (TYLENOL) 325 MG tablet Take 2 tablets (650 mg total) by mouth every 6 (six) hours as needed. 04/11/13   Bobby Rumpf York, PA-C  amLODipine (NORVASC) 10 MG tablet Take 1 tablet (10 mg total) by mouth daily. 03/24/13   Lauree Chandler, NP  Biotin  1000 MCG tablet Take 1 tablet (1 mg total) by mouth daily. 03/24/13   Lauree Chandler, NP  Calcium Carbonate-Vitamin D (CALCIUM + D PO) Take 1 tablet by mouth daily.    Historical Provider, MD  clopidogrel (PLAVIX) 75 MG tablet Take 1 tablet (75 mg total) by mouth daily. 03/24/13   Lauree Chandler, NP  cyanocobalamin (,VITAMIN B-12,) 1000 MCG/ML injection Inject 1 mL (1,000 mcg total) into the muscle every 30 (thirty) days. 03/24/13   Lauree Chandler, NP  furosemide (LASIX) 40 MG tablet Take 1 tablet (40 mg total) by mouth daily with breakfast. 04/11/13   Melton Alar, PA-C   mineral oil-hydrophilic petrolatum (AQUAPHOR) ointment Apply 1 application topically daily. To lower extremities 03/24/13   Lauree Chandler, NP  Multiple Vitamin (MULTIVITAMIN WITH MINERALS) TABS Take 1 tablet by mouth daily. 03/24/13   Lauree Chandler, NP  oxyCODONE (OXY IR/ROXICODONE) 5 MG immediate release tablet Take 1 tablet (5 mg total) by mouth every 4 (four) hours as needed. 04/11/13   Bobby Rumpf York, PA-C  potassium chloride SA (K-DUR,KLOR-CON) 20 MEQ tablet Take 1 tablet (20 mEq total) by mouth daily. 04/11/13   Bobby Rumpf York, PA-C  zolpidem (AMBIEN) 5 MG tablet Take 1 tablet (5 mg total) by mouth at bedtime as needed for sleep (insomnia). 04/11/13   Bobby Rumpf York, PA-C   BP 204/71 mmHg  Pulse 61  Temp(Src) 97.5 F (36.4 C) (Oral)  Resp 20  SpO2 98% Physical Exam  Constitutional: She is oriented to person, place, and time. No distress.  Morbidly obese  HENT:  Head: Normocephalic and atraumatic.  Mouth/Throat: Oropharynx is clear and moist.  Poor dentition  Eyes: Conjunctivae and EOM are normal. Pupils are equal, round, and reactive to light.  Neck: Normal range of motion.  Cardiovascular: Normal rate, regular rhythm and normal heart sounds.   No murmur heard. LE pulses difficult to palpate but found via doppler  Pulmonary/Chest: Effort normal and breath sounds normal. No respiratory distress. She has no wheezes.  Abdominal: Soft. Bowel sounds are normal. There is no tenderness.  Musculoskeletal: She exhibits edema and tenderness (LE).  3/5 quad strength (R>L), 4/5 plantarflex bilaterally  Neurological: She is alert and oriented to person, place, and time.  Could not stand patient up due to weakness  Skin: Skin is warm. She is not diaphoretic.  Keratinization of the LE bilaterally   ED Course  Procedures (including critical care time) Labs Review Labs Reviewed  COMPREHENSIVE METABOLIC PANEL - Abnormal; Notable for the following:    Glucose, Bld 100 (*)    Albumin  3.0 (*)    GFR calc non Af Amer 59 (*)    All other components within normal limits  CBC WITH DIFFERENTIAL/PLATELET  TROPONIN I  BRAIN NATRIURETIC PEPTIDE  URINALYSIS, ROUTINE W REFLEX MICROSCOPIC (NOT AT [facility])  CK    Imaging Review Dg Chest 2 View  02/11/2016  CLINICAL DATA:  Weakness and back pain for 1 day, history hypertension, stroke, former smoker EXAM: CHEST  2 VIEW COMPARISON:  04/08/2013 FINDINGS: Enlargement of cardiac silhouette. Mediastinal contours and pulmonary vascularity normal. Lungs clear. No pleural effusion or pneumothorax. Advanced arthritic process at the shoulders bilaterally. Significant diffuse osseous demineralization. IMPRESSION: Enlargement of cardiac silhouette without acute infiltrate. Electronically Signed   By: Lavonia Dana M.D.   On: 02/11/2016 16:54   I have personally reviewed and evaluated these images and lab results as part of my medical decision-making.  EKG Interpretation   Date/Time:  Monday February 11 2016 15:55:04 EST Ventricular Rate:  59 PR Interval:  226 QRS Duration: 90 QT Interval:  436 QTC Calculation: 431 R Axis:   -17 Text Interpretation:  Sinus bradycardia with 1st degree A-V block  Otherwise normal ECG No significant change was found Confirmed by Wyvonnia Dusky   MD, STEPHEN [phone]) on 02/11/2016 4:02:04 PM      MDM   Final diagnoses:  Generalized weakness  Physical deconditioning  Hypertensive urgency  Accelerated hypertension  Chronic venous stasis dermatitis of both lower extremities    1400: CXR, BNP, CBC, CK, CMP, Trop, UA, LE venous dopplers ordered.  Patient weak on exam.  BP elevated.  Has not taken home BP meds today.  No evidence of hypertensive emergency at this time.  Will f/u labs, imaging.  EKG with bradycardia but no evidence of ischemia.  Discussed care with social work.  1737: CMP with low albumin 3.0, BNP 38.0, CBC unremarkable. CXR: No acute processes.  Awaiting CK, LE doppler, UA.  In and out cath ordered.  CK negative.  Discussed care with Levada Dy, [place].  She will consult [facility] for this patient.  HH PT, OT, RN, Health aid and CSW ordered.  1911: Discussed patient with [facility].  CT head and RN speech evaluation ordered per request.  2018: CT head negative for acute processes.  LAILONI BAQUERA is a 82 y.o. female that presents with approx 1 week history of increased weakness from baseline.  She has been bed bound.  Thus far, cannot explain new weakness.  CT head negative.  Labs appear to be normal.  Dr Raliegh Ip to admit.    Janora Norlander, DO 02/11/16 2152  Ezequiel Essex, MD 02/12/16 1010

## 2016-02-11 NOTE — Care Management Obs Status (Signed)
[place] NOTIFICATION   Patient Details  Name: Connie Miranda MRN: [id number] Date of Birth: [birth year]   Medicare Observation Status Notification Given:  Ernesta Amble, RN 02/11/2016, 11:00 PM

## 2016-02-11 NOTE — ED Notes (Signed)
Per EMS - pt from home. Steady decline in pt ability to ambulate d/t bilateral lower leg peripheral edema. Left leg black from knee down. No fever. VSS. A&O x 4. Family from out of town visiting, concerned about pt status, desiring placement for pt.

## 2016-02-11 NOTE — Progress Notes (Signed)
VASCULAR LAB PRELIMINARY  PRELIMINARY  PRELIMINARY  PRELIMINARY  Bilateral lower extremity venous duplex  completed.    Preliminary report:  Bilateral:  No evidence of DVT, superficial thrombosis, or Baker's Cyst.    Oluwaseun Bruyere, RVT 02/11/2016, 6:29 PM

## 2016-02-11 NOTE — ED Notes (Signed)
Admitting at bedside 

## 2016-02-11 NOTE — Care Management Note (Signed)
Case Management Note  Patient Details  Name: Connie Miranda MRN: [id number] Date of Birth: [birth year]  Subjective/Objective:         Patient presented to [facility] ED with c/o weakness, inability to ambulate.  Hx of  CVA, HTN and BLE swelling          Action/Plan:  CM consulted, met with patient to discuss care. Patient has named brother Connie Miranda to share care information with. Discussed ED evaluation thus far. No acute finding noted. Patient lives at home alone and has a friend who cares for her some nights but doesn't have any one to assist her with care during the day. In the past week patient states, she has been deconditioning over the last couple of weeks not able to get out of bed or ambulate Discussed care goals patient and family are seeking placement. CM explained that patient does not meet criteria for SNF placement based on Medicare guideline,  Discussed care options HH  Services recommendations for [facility] PT/OT/ HHA/ SW. Patient is agreeable, offered choice [facility] was selected.  CM discussed  private duty HHA assistance, explained that would be an out of pocket care service that is not covered by Medicare, CM provided list.  Teach back done patient and family verbalized understanding. ED evaluation still pending.   Expected Discharge Date:                  Expected Discharge Plan:  [place]  In-House Referral:     Discharge planning Services  CM Consult  Post Acute Care Choice:    Choice offered to:  Patient (Family)  DME Arranged:    DME Agency:     HH Arranged:  RN, PT, OT, Nurse's Aide, Social Work [employer] Agency:  [place]  Status of Service:  In process, will continue to follow  Medicare Important Message Given:    Date Medicare IM Given:    Medicare IM give by:    Date Additional Medicare IM Given:    Additional Medicare Important Message give by:     If discussed at [place] of Stay Meetings, dates discussed:    Additional  CommentsLaurena Slimmer, RN 02/11/2016, 11:02 PM

## 2016-02-12 ENCOUNTER — Encounter (HOSPITAL_COMMUNITY): Payer: Self-pay | Admitting: General Practice

## 2016-02-12 ENCOUNTER — Observation Stay (HOSPITAL_COMMUNITY): Payer: Medicare Other

## 2016-02-12 DIAGNOSIS — R29898 Other symptoms and signs involving the musculoskeletal system: Secondary | ICD-10-CM | POA: Diagnosis not present

## 2016-02-12 DIAGNOSIS — R531 Weakness: Secondary | ICD-10-CM | POA: Diagnosis not present

## 2016-02-12 LAB — CREATININE, SERUM: CREATININE: 0.84 mg/dL (ref 0.44–1.00)

## 2016-02-12 LAB — COMPREHENSIVE METABOLIC PANEL
ALBUMIN: 2.7 g/dL — AB (ref 3.5–5.0)
ALT: 16 U/L (ref 14–54)
AST: 19 U/L (ref 15–41)
Alkaline Phosphatase: 82 U/L (ref 38–126)
Anion gap: 9 (ref 5–15)
BILIRUBIN TOTAL: 0.5 mg/dL (ref 0.3–1.2)
BUN: 12 mg/dL (ref 6–20)
CHLORIDE: 107 mmol/L (ref 101–111)
CO2: 25 mmol/L (ref 22–32)
Calcium: 10 mg/dL (ref 8.9–10.3)
Creatinine, Ser: 0.93 mg/dL (ref 0.44–1.00)
GFR calc Af Amer: >60 mL/min (ref >60)
GFR calc non Af Amer: 57 mL/min — ABNORMAL LOW (ref >60)
GLUCOSE: 112 mg/dL — AB (ref 65–99)
POTASSIUM: 3.5 mmol/L (ref 3.5–5.1)
Sodium: 141 mmol/L (ref 135–145)
TOTAL PROTEIN: 6.8 g/dL (ref 6.5–8.1)

## 2016-02-12 LAB — URIC ACID: Uric Acid, Serum: 6.3 mg/dL (ref 2.3–6.6)

## 2016-02-12 LAB — CBC
HEMATOCRIT: 38.5 % (ref 36.0–46.0)
HEMATOCRIT: 41.4 % (ref 36.0–46.0)
HEMOGLOBIN: 12.2 g/dL (ref 12.0–15.0)
HEMOGLOBIN: 13.2 g/dL (ref 12.0–15.0)
MCH: 30.3 pg (ref 26.0–34.0)
MCH: 30.3 pg (ref 26.0–34.0)
MCHC: 31.7 g/dL (ref 30.0–36.0)
MCHC: 31.9 g/dL (ref 30.0–36.0)
MCV: 95.2 fL (ref 78.0–100.0)
MCV: 95.5 fL (ref 78.0–100.0)
Platelets: 225 10*3/uL (ref 150–400)
Platelets: 244 10*3/uL (ref 150–400)
RBC: 4.03 MIL/uL (ref 3.87–5.11)
RBC: 4.35 MIL/uL (ref 3.87–5.11)
RDW: 13.2 % (ref 11.5–15.5)
RDW: 13.4 % (ref 11.5–15.5)
WBC: 7.9 10*3/uL (ref 4.0–10.5)
WBC: 8.6 10*3/uL (ref 4.0–10.5)

## 2016-02-12 LAB — LIPID PANEL
CHOLESTEROL: 153 mg/dL (ref 0–200)
HDL: 24 mg/dL — ABNORMAL LOW (ref >40)
LDL CALC: 101 mg/dL — AB (ref 0–99)
TRIGLYCERIDES: 140 mg/dL (ref <150)
Total CHOL/HDL Ratio: 6.4 RATIO
VLDL: 28 mg/dL (ref 0–40)

## 2016-02-12 NOTE — Progress Notes (Signed)
ABN issued per Physician Advisor direction- pt signed and copy placed in shadow chart with original given to pt.

## 2016-02-12 NOTE — Progress Notes (Signed)
[facility] PROGRESS NOTE  Connie Miranda NWG:[id number] DOB: 05/13/1933 DOA: 02/11/2016 PCP: [name], MD  Assessment/Plan: 1. Progressive bilateral lower extremity weakness for months to years -CT head without acute findings -check B12/TSH, check Xrays knees and Hips -i do not think MRI brain and L spine warranted at thsi time, based on symptoms and chronicity -pt reports being wheel chair bound mostly for years now, used to ambulate using a walker in the past, unable to describe when that stopped -PT/OT/CM consult  2. Hypertension -  - continue amlodipine, lasix  3. Morbid obesity.  4. Chronic lymphedema  - Dopplers were negative for DVT, takes lasix -continue   DVT Prophylaxis Lovenox.   Code Status: Full code.  Family Communication: Discussed with patient.  Disposition Plan: to be determined, unable to care for self at home  HPI/Subjective: Weakness, pain in knees for months -years  Objective: Filed Vitals:   02/12/16 0200 02/12/16 0420  BP:  135/55  Pulse:  63  Temp: 98.4 F (36.9 C) 97.9 F (36.6 C)  Resp:     No intake or output data in the 24 hours ending 02/12/16 0927 Filed Weights   02/11/16 2332  Weight: 104.3 kg (229 lb 15 oz)    Exam:   General: AAOx3  Cardiovascular: S1S2/RRR  Respiratory: CTAB  Abdomen:soft, obese, NT, BS present  Musculoskeletal: hyperpigmentation, hyperkeratosis, some lymphedema changes on both legs  Data Reviewed: Basic Metabolic Panel:  Recent Labs Lab 02/11/16 1550 02/11/16 2346 02/12/16 0625  NA 140  --  141  K 3.6  --  3.5  CL 107  --  107  CO2 24  --  25  GLUCOSE 100*  --  112*  BUN 10  --  12  CREATININE 0.90 0.84 0.93  CALCIUM 10.0  --  10.0   Liver Function Tests:  Recent Labs Lab 02/11/16 1550 02/12/16 0625  AST 20 19  ALT 16 16  ALKPHOS 88 82  BILITOT 0.5 0.5  PROT 7.5 6.8  ALBUMIN 3.0* 2.7*   No results for input(s): LIPASE, AMYLASE in the last 168 hours. No results  for input(s): AMMONIA in the last 168 hours. CBC:  Recent Labs Lab 02/11/16 1550 02/11/16 2346 02/12/16 0625  WBC 8.2 8.6 7.9  NEUTROABS 6.3  --   --   HGB 12.7 13.2 12.2  HCT 40.9 41.4 38.5  MCV 96.0 95.2 95.5  PLT 252 244 225   Cardiac Enzymes:  Recent Labs Lab 02/11/16 1550  CKTOTAL 33*  TROPONINI <0.03   BNP (last 3 results)  Recent Labs  02/11/16 1550  BNP 38.0    ProBNP (last 3 results) No results for input(s): PROBNP in the last 8760 hours.  CBG: No results for input(s): GLUCAP in the last 168 hours.  No results found for this or any previous visit (from the past 240 hour(s)).   Studies: Dg Chest 2 View  02/11/2016  CLINICAL DATA:  Weakness and back pain for 1 day, history hypertension, stroke, former smoker EXAM: CHEST  2 VIEW COMPARISON:  04/08/2013 FINDINGS: Enlargement of cardiac silhouette. Mediastinal contours and pulmonary vascularity normal. Lungs clear. No pleural effusion or pneumothorax. Advanced arthritic process at the shoulders bilaterally. Significant diffuse osseous demineralization. IMPRESSION: Enlargement of cardiac silhouette without acute infiltrate. Electronically Signed   By: Ulyses Southward M.D.   On: 02/11/2016 16:54   Ct Head Wo Contrast  02/11/2016  CLINICAL DATA:  82 year old female with generalized weakness EXAM: CT HEAD WITHOUT CONTRAST  TECHNIQUE: Contiguous axial images were obtained from the base of the skull through the vertex without intravenous contrast. COMPARISON:  Most recent prior head CT 04/08/2013; brain MRI 04/10/2013 FINDINGS: Negative for acute intracranial hemorrhage, acute infarction, mass, mass effect, hydrocephalus or midline shift. Gray-white differentiation is preserved throughout. Stable appearance of periventricular, subcortical and deep white matter hypoattenuation most consistent with chronic microvascular ischemic white matter disease. Cerebral cortical atrophy commensurate with age. Ex vacuo dilatation of the  lateral ventricles similar compared to prior and likely related to central atrophy. No focal soft tissue or calvarial abnormality. Globes and orbits symmetric bilaterally. Normal aeration mastoid air cells and visualized paranasal sinuses. IMPRESSION: 1. No acute intracranial abnormality. 2. Stable chronic microvascular ischemic white matter disease and cerebral cortical atrophy. Electronically Signed   By: Malachy Moan M.D.   On: 02/11/2016 20:06    Scheduled Meds: . amLODipine  10 mg Oral Daily  . clopidogrel  75 mg Oral Daily  . [START ON 02/25/2016] cyanocobalamin  1,000 mcg Intramuscular Q30 days  . enoxaparin (LOVENOX) injection  40 mg Subcutaneous Q24H  . furosemide  40 mg Oral Q breakfast  . multivitamin with minerals  1 tablet Oral Daily  . potassium chloride SA  20 mEq Oral Daily   Continuous Infusions:  Antibiotics Given (last 72 hours)    None      Principal Problem:   Weakness of both legs Active Problems:   Hypertension   Glucose-6-phosphate dehydrogenase deficiency (HCC)   Morbid obesity (HCC)   Lower extremity weakness    Time spent:    [facility]  [facility] Pager [phone]. If 7PM-7AM, please contact night-coverage at [email or web address], password [facility] 02/12/2016, 9:27 AM

## 2016-02-12 NOTE — Care Management Note (Signed)
Case Management Note Marvetta Gibbons RN, BSN Unit 2W-Case Manager [phone]  Patient Details  Name: Connie Miranda MRN: [id number] Date of Birth: [birth year]  Subjective/Objective:     Pt admitted with weakness               Action/Plan: PTA pt lived at home alone- per conversation with pt she has private pay assistance that she has arranged to come stay with her both day and night- she is somewhat elusive as to when this assistance comes and goes but she does state that she has 7 day a week coverage for care at home- discussed DME in the home and pt reports that she has a hospital bed, w/c, bsc, rw, shower chair, - it might be helpful to have a hoyer lift at home- MD please consider DME order for home- discussed Medicare rules for STSNF and that pt did not qualify for medicare to cover rehab stay- also discussed [place] services- pt's brother and his wife entered the room during this discussion and pt gave permission to continue discharge discussion with him and his wife present. Went back over [employer] for STSNF and coverage for [facility] services- explained that pt would have to pay out of pocket for STSNF- plan for now is for pt to return home with [facility] services- brother and his wife are working with pt to be sure private pay assistance is arranged to assist pt. Pt will need ambulance transportation home per brother. Brother has Therapist, music of agencies- also provided list of [facility] agencies for review- they also wanted an Emergency planning/management officer which was provided-  ABN was also issued and explained to pt/brother and his wife- pt signed and was given copy with additional copy placed in shadow chart. CM to continue to follow. Pt will need HH orders for RN/PT/OT/aide/SW- discussed plan with DR. Broadus John  Expected Discharge Date:                  Expected Discharge Plan:  [place]  In-House Referral:     Discharge planning Services  CM Consult  Post Acute Care  Choice:  Durable Medical Equipment, Home Health Choice offered to:  Patient (Family)  DME Arranged:    DME Agency:     HH Arranged:  RN, PT, OT, Nurse's Aide, Social Work [employer] Agency:     Status of Service:  In process, will continue to follow  Medicare Important Message Given:    Date Medicare IM Given:    Medicare IM give by:    Date Additional Medicare IM Given:    Additional Medicare Important Message give by:     If discussed at [place] of Stay Meetings, dates discussed:    Additional Comments:  Dawayne Patricia, RN 02/12/2016, 4:29 PM

## 2016-02-12 NOTE — Evaluation (Signed)
Physical Therapy Evaluation Patient Details Name: AKIAH HARTIGAN MRN: [id number] DOB: 22-Nov-1933 Today's Date: 02/12/2016   History of Present Illness  Connie Miranda is a 82 y.o. female with history of chronic lymphedema, pemphigus, hypertension and previous stroke presents to the ER because of increasing weakness to ambulate.  Clinical Impression  Patient presents with dependencies for all mobility at this time.  Feel she is appropriate for SNF level rehab, but if unable to afford will need HHPT and possibly hoyer lift for home.  Will follow acutely.    Follow Up Recommendations SNF    Equipment Recommendations  None recommended by PT    Recommendations for Other Services       Precautions / Restrictions Precautions Precautions: Fall      Mobility  Bed Mobility Overal bed mobility: Needs Assistance;+2 for physical assistance Bed Mobility: Supine to Sit;Sit to Supine;Rolling Rolling: Max assist   Supine to sit: HOB elevated;Max assist Sit to supine: +2 for physical assistance;Total assist   General bed mobility comments: pt able to move R LE to EOB and needed assist with L, pulls up on rail and with my help from behind to lift trunk with extra time; to supine NT in room and assisted with shoulders as I lifted legs into bed  Transfers Overall transfer level: Needs assistance Equipment used: Rolling walker (2 wheeled) Transfers: Sit to/from Stand Sit to Stand: +2 physical assistance;Total assist         General transfer comment: 2 attempts with +1 A, then 2 attempts wtih +2 assist, but pt unable to stand, so assisted back to bed  Ambulation/Gait                Stairs            Wheelchair Mobility    Modified Rankin (Stroke Patients Only)       Balance Overall balance assessment: Needs assistance Sitting-balance support: Feet supported Sitting balance-Leahy Scale: Poor Sitting balance - Comments: can sit with UE support briefly, but starts to  lean R so assited to sit while waiting for NT assist for transfer Postural control: Right lateral lean;Posterior lean                                   Pertinent Vitals/Pain Pain Assessment: 0-10 Pain Score: 9  Pain Location: behind both knees Pain Descriptors / Indicators: Aching Pain Intervention(s): Limited activity within patient's tolerance;Monitored during session;Repositioned    Home Living Family/patient expects to be discharged to:: Private residence Living Arrangements: Non-relatives/Friends Available Help at Discharge: Personal care attendant;Home health Type of Home: House Home Access: Stairs to enter;Ramped entrance     Home Layout: Two level;Able to live on main level with bedroom/bathroom Home Equipment: Gilford Rile - 2 wheels;Wheelchair - manual;Bedside commode Additional Comments: lift chair    Prior Function Level of Independence: Needs assistance   Gait / Transfers Assistance Needed: assist to walk, uses w/c in the home and reports performs lateral scoots     Comments: has HH/PCS services at night and intermittent int he day     Hand Dominance        Extremity/Trunk Assessment   Upper Extremity Assessment: RUE deficits/detail;LUE deficits/detail RUE Deficits / Details: Limited AAROM to about 80 with crepitus in shoulder, elbow flexion strength 3+/5     LUE Deficits / Details: AAROM about 100 shoulder flexion strength 2+/5 elbow flexion 3+/5   Lower Extremity  Assessment: RLE deficits/detail;LLE deficits/detail RLE Deficits / Details: AAROM about 50 degrees knee flexion, strength about 2+/5, ankle AROM WFL LLE Deficits / Details: AAROM limited to about 30 degrees knee flexion with pain, strength 1+/5, ankle AROM WFL     Communication   Communication: No difficulties  Cognition Arousal/Alertness: Awake/alert Behavior During Therapy: WFL for tasks assessed/performed Overall Cognitive Status: Within Functional Limits for tasks assessed                       General Comments General comments (skin integrity, edema, etc.): chronic skin changes in both LE's due to lymphedema    Exercises General Exercises - Lower Extremity Ankle Circles/Pumps: AROM;Both;5 reps;Supine Heel Slides: AAROM;Both;5 reps;Supine      Assessment/Plan    PT Assessment Patient needs continued PT services  PT Diagnosis Generalized weakness;Acute pain   PT Problem List Decreased strength;Decreased range of motion;Decreased activity tolerance;Decreased balance;Decreased mobility;Pain  PT Treatment Interventions DME instruction;Balance training;Functional mobility training;Patient/family education;Therapeutic activities;Therapeutic exercise;Wheelchair mobility training   PT Goals (Current goals can be found in the Care Plan section) Acute Rehab PT Goals Patient Stated Goal: To go to rehab PT Goal Formulation: With patient Time For Goal Achievement: 02/19/16 Potential to Achieve Goals: Fair    Frequency Min 3X/week   Barriers to discharge        Co-evaluation               End of Session Equipment Utilized During Treatment: Gait belt Activity Tolerance: Patient limited by pain Patient left: in bed;with call bell/phone within reach Nurse Communication: Mobility status         Time: 1135-1212 PT Time Calculation (min) (ACUTE ONLY): 37 min   Charges:   PT Evaluation $PT Eval Moderate Complexity: 1 Procedure PT Treatments $Therapeutic Activity: 8-22 mins   PT G Codes:        Reginia Naas 02-20-2016, 12:56 PM  Magda Kiel, [place] Feb 20, 2016

## 2016-02-13 DIAGNOSIS — R531 Weakness: Secondary | ICD-10-CM | POA: Diagnosis not present

## 2016-02-13 DIAGNOSIS — R29898 Other symptoms and signs involving the musculoskeletal system: Secondary | ICD-10-CM | POA: Diagnosis not present

## 2016-02-13 LAB — HEMOGLOBIN A1C
Hgb A1c MFr Bld: 5.7 % — ABNORMAL HIGH (ref 4.8–5.6)
Mean Plasma Glucose: 117 mg/dL

## 2016-02-13 LAB — VITAMIN B12: VITAMIN B 12: 1491 pg/mL — AB (ref 180–914)

## 2016-02-13 NOTE — Care Management Note (Signed)
Case Management Note Marvetta Gibbons RN, BSN Unit 2W-Case Manager [phone]  Patient Details  Name: Connie Miranda MRN: [id number] Date of Birth: Sep 21, 1933  Subjective/Objective:     Pt admitted with weakness               Action/Plan: PTA pt lived at home alone- per conversation with pt she has private pay assistance that she has arranged to come stay with her both day and night- she is somewhat elusive as to when this assistance comes and goes but she does state that she has 7 day a week coverage for care at home- discussed DME in the home and pt reports that she has a hospital bed, w/c, bsc, rw, shower chair, - it might be helpful to have a hoyer lift at home- MD please consider DME order for home- discussed Medicare rules for STSNF and that pt did not qualify for medicare to cover rehab stay- also discussed [place] services- pt's brother and his wife entered the room during this discussion and pt gave permission to continue discharge discussion with him and his wife present. Went back over [employer] for STSNF and coverage for [facility] services- explained that pt would have to pay out of pocket for STSNF- plan for now is for pt to return home with [facility] services- brother and his wife are working with pt to be sure private pay assistance is arranged to assist pt. Pt will need ambulance transportation home per brother. Brother has Therapist, music of agencies- also provided list of [facility] agencies for review- they also wanted an Emergency planning/management officer which was provided-  ABN was also issued and explained to pt/brother and his wife- pt signed and was given copy with additional copy placed in shadow chart. CM to continue to follow. Pt will need HH orders for RN/PT/OT/aide/SW- discussed plan with DR. Broadus John  Expected Discharge Date:     02/13/16             Expected Discharge Plan:  [place]  In-House Referral:  Clinical Social Work  Discharge planning Services  CM  Consult  Post Acute Care Choice:  Durable Medical Equipment, Home Health Choice offered to:  Patient (Family)  DME Arranged:   None DME Agency:  NA  HH Arranged:  RN, PT, OT, Nurse's Aide, Social Work [employer] Agency:  [place]  Status of Service:  Completed, signed off  Medicare Important Message Given:    Date Medicare IM Given:    Medicare IM give by:    Date Additional Medicare IM Given:    Additional Medicare Important Message give by:     If discussed at [place] of Stay Meetings, dates discussed:   Discharge Disposition: Home/home health    Additional Comments:  02/13/16- 1400- Marvetta Gibbons RN, BSN - pt for d/c home today with [facility]- orders have been placed for HH-RN/PT/OT/aide/SW- f/u done with pt regarding [place] agency choice- pt states she thinks it is [facility] but wants this CM to call her brotherDarnell Level to confirm- call made to Darnell [phone] confirmed with him that [facility] was agency of choice for [facility] service- per conversation with Bruce he states that they have arranged for someone to stay with pt tonight and then are working to choose a private duty agency (either [employer] or [employer]) brother reports that he will have something set up for pt prior to his return to [place]. He and pt state that  they want to wait on hoyer lift for home and let [facility] PT assess for need and ability to use in home. Referral made for [facility] to Manuela Schwartz with [facility]- pt to be transported via ambulance home- CSW has made arrangements.   Dawayne Patricia, RN 02/13/2016, 2:31 PM

## 2016-02-13 NOTE — Progress Notes (Signed)
Call placed to her brother, Anacamila Stelzner - stated PTAR will be call - he confirmed he or family would be at her house on [address] when ambulance arrives. Called PTAR - stated they would arrive here at [facility] approximately 4:30 PM.

## 2016-02-13 NOTE — Progress Notes (Signed)
Brother called. Requested he be called before she is d/c'ed home - states he will be sure someone is there to be with the patient when she arrives home. Requested use this cell phone #: [phone].

## 2016-02-13 NOTE — Discharge Summary (Signed)
Physician Discharge Summary  Connie Miranda [id number] DOB: 05-01-1933 DOA: 02/11/2016  PCP: Philis Fendt, MD  Admit date: 02/11/2016 Discharge date: 02/13/2016  Time spent: 35 minutes  Recommendations for Outpatient Follow-up:  Follow up with orthopedic for further treatment evaluation of severe osteoarthritis of B/L knee  Discharge Diagnoses:    Weakness of both legs   Hypertension   Glucose-6-phosphate dehydrogenase deficiency ([place])   Morbid obesity ([place])   Lower extremity weakness   Discharge Condition: Stable  Diet recommendation: Heart Healthy  Filed Weights   02/11/16 2332  Weight: 104.3 kg (229 lb 15 oz)    History of present illness:  Connie Miranda is a 82 y.o. female with history of chronic lymphedema, pemphigus, hypertension and previous stroke presents to the ER because of increasing weakness to ambulate. Patient has morbid obesity and states she usually is ambulatory with help of walker. Also uses wheelchair. But over the last 1 week patient is finding it increasingly difficult to ambulate. Denies any fall. On exam patient has significant lower extremity edema which patient states is chronic. Dopplers were negative for DVT. On exam patient has significant pain of the lower extremity on moving. Patient CT head is negative for anything acute. Patient will be admitted for further observation. Possibly may need rehabilitation.   Review of Systems: As presented in the history of presenting illness, rest negative.  Hospital Course:  1. Progressive bilateral lower extremity weakness for months to years; suspect related to B/L knee severe osteoarthritis.  -CT head without acute findings - B12 high  Xrays knees and Hips showed significant osteoarthritis of knee, this might play role on patient weakness and inability to ambulate. I have arrange follow up with Dr Percell Miller with orthopedic.  -i do not think MRI brain and L spine warranted at thsi time, based on symptoms and  chronicity -pt reports being wheel chair bound mostly for years now, used to ambulate using a walker in the past, unable to describe when that stopped -HH PT, insurance wont pay for SNF  2. Hypertension - - continue amlodipine, lasix  3. Morbid obesity.  4. Chronic lymphedema - Dopplers were negative for DVT, takes lasix -continue   Procedures:  none  Consultations:  none  Discharge Exam: Filed Vitals:   02/12/16 1436 02/12/16 2034  BP: 145/60 157/62  Pulse: 66 77  Temp: 98 F (36.7 C) 98.2 F (36.8 C)  Resp: 18 18    General: NAD Cardiovascular: S 1, S 2 RRR Respiratory: CTA  Discharge Instructions   Discharge Instructions    Diet - low sodium heart healthy    Complete by:  As directed      Increase activity slowly    Complete by:  As directed           Current Discharge Medication List    CONTINUE these medications which have NOT CHANGED   Details  acetaminophen (TYLENOL) 325 MG tablet Take 2 tablets (650 mg total) by mouth every 6 (six) hours as needed.    amLODipine (NORVASC) 10 MG tablet Take 1 tablet (10 mg total) by mouth daily. Qty: 30 tablet, Refills: 0   Associated Diagnoses: Hypertension    Biotin 1000 MCG tablet Take 1 tablet (1 mg total) by mouth daily. Qty: 30 tablet, Refills: 0   Associated Diagnoses: Cellulitis of left lower leg    Calcium Carbonate-Vitamin D (CALCIUM + D PO) Take 1 tablet by mouth daily.    clopidogrel (PLAVIX) 75 MG tablet Take  1 tablet (75 mg total) by mouth daily. Qty: 30 tablet, Refills: 0   Associated Diagnoses: Stroke ([place])    cyanocobalamin (,VITAMIN B-12,) 1000 MCG/ML injection Inject 1 mL (1,000 mcg total) into the muscle every 30 (thirty) days. Qty: 1 mL, Refills: 0   Associated Diagnoses: Anemia    furosemide (LASIX) 40 MG tablet Take 1 tablet (40 mg total) by mouth daily with breakfast. Qty: 30 tablet, Refills: 0    mineral oil-hydrophilic petrolatum (AQUAPHOR) ointment Apply 1 application  topically daily. To lower extremities Qty: 420 g, Refills: 0   Associated Diagnoses: Cellulitis of left lower leg    Multiple Vitamin (MULTIVITAMIN WITH MINERALS) TABS Take 1 tablet by mouth daily. Qty: 30 tablet, Refills: 0   Associated Diagnoses: Cellulitis of left lower leg    oxyCODONE (OXY IR/ROXICODONE) 5 MG immediate release tablet Take 1 tablet (5 mg total) by mouth every 4 (four) hours as needed. Qty: 30 tablet, Refills: 0    potassium chloride SA (K-DUR,KLOR-CON) 20 MEQ tablet Take 1 tablet (20 mEq total) by mouth daily. Qty: 30 tablet, Refills: 0    zolpidem (AMBIEN) 5 MG tablet Take 1 tablet (5 mg total) by mouth at bedtime as needed for sleep (insomnia). Qty: 30 tablet, Refills: 0       Allergies  Allergen Reactions  . Asa [Aspirin] Other (See Comments)    Doctor told her not to take due to type anemia  . Phosphate Other (See Comments)    Anything containing phosphate 6. Unknown reaction  . Codeine Other (See Comments)    Made her too sleepy  . Penicillins Itching and Swelling   Follow-up Information    Follow up with [facility].   Why:  Home Health Services RN  Physical Therapy/ [place] 24-48  post discharge.   Contact information:   [address] [place] [address] [phone]       Follow up with Renette Butters, MD.   Specialty:  Orthopedic Surgery   Why:  friday 17 at 8;30 am    Contact information:   [place] [place] [address] [phone]        The results of significant diagnostics from this hospitalization (including imaging, microbiology, ancillary and laboratory) are listed below for reference.    Significant Diagnostic Studies: Dg Chest 2 View  02/11/2016  CLINICAL DATA:  Weakness and back pain for 1 day, history hypertension, stroke, former smoker EXAM: CHEST  2 VIEW COMPARISON:  04/08/2013 FINDINGS: Enlargement of cardiac silhouette. Mediastinal  contours and pulmonary vascularity normal. Lungs clear. No pleural effusion or pneumothorax. Advanced arthritic process at the shoulders bilaterally. Significant diffuse osseous demineralization. IMPRESSION: Enlargement of cardiac silhouette without acute infiltrate. Electronically Signed   By: Lavonia Dana M.D.   On: 02/11/2016 16:54   Dg Pelvis 1-2 Views  02/12/2016  CLINICAL DATA:  Pain with weight-bearing. EXAM: PELVIS - 1-2 VIEW COMPARISON:  None. FINDINGS: Frontal view of the pelvis shows diffuse demineralization without evidence for an acute fracture. Degenerative changes noted in the lower lumbar spine. Prominent stool volume noted in the rectum. Lip these loop IUD projects over the central anatomic pelvis. IMPRESSION: No acute bony findings. Electronically Signed   By: Misty Stanley M.D.   On: 02/12/2016 14:35   Dg Knee 1-2 Views Left  02/12/2016  CLINICAL DATA:  82 year old with acute superimposed upon chronic left knee pain. No recent injuries. EXAM: LEFT KNEE - 1-2  VIEW COMPARISON:  Visualized left knee on left tibia-fibula x-rays 09/18/2011. FINDINGS: No evidence of acute or subacute fracture or dislocation. Near complete loss of the medial compartment joint space with hypertrophic spurring. Severe patellofemoral compartment joint space narrowing with hypertrophic spurring. Moderate lateral compartment joint space narrowing with hypertrophic spurring. Osseous demineralization. No visible joint effusion. IMPRESSION: Severe tricompartment osteoarthritis with near complete loss of the medial compartment joint space. Electronically Signed   By: Evangeline Dakin M.D.   On: 02/12/2016 15:15   Dg Knee 1-2 Views Right  02/12/2016  CLINICAL DATA:  82 year old with acute superimposed upon chronic right knee pain. No recent injuries. EXAM: RIGHT KNEE - 1-2 VIEW COMPARISON:  01/29/2013. FINDINGS: No evidence of acute or subacute fracture or dislocation. Near complete loss of the medial compartment joint  space with associated hypertrophic spurring. Severe patellofemoral and lateral compartment joint space narrowing with associated hypertrophic spurring. Mild osseous demineralization. No visible joint effusion. IMPRESSION: Severe tricompartment osteoarthritis, with near complete loss of the medial compartment joint space. No acute or subacute osseous abnormality. Electronically Signed   By: Evangeline Dakin M.D.   On: 02/12/2016 14:33   Ct Head Wo Contrast  02/11/2016  CLINICAL DATA:  82 year old female with generalized weakness EXAM: CT HEAD WITHOUT CONTRAST TECHNIQUE: Contiguous axial images were obtained from the base of the skull through the vertex without intravenous contrast. COMPARISON:  Most recent prior head CT 04/08/2013; brain MRI 04/10/2013 FINDINGS: Negative for acute intracranial hemorrhage, acute infarction, mass, mass effect, hydrocephalus or midline shift. Gray-white differentiation is preserved throughout. Stable appearance of periventricular, subcortical and deep white matter hypoattenuation most consistent with chronic microvascular ischemic white matter disease. Cerebral cortical atrophy commensurate with age. Ex vacuo dilatation of the lateral ventricles similar compared to prior and likely related to central atrophy. No focal soft tissue or calvarial abnormality. Globes and orbits symmetric bilaterally. Normal aeration mastoid air cells and visualized paranasal sinuses. IMPRESSION: 1. No acute intracranial abnormality. 2. Stable chronic microvascular ischemic white matter disease and cerebral cortical atrophy. Electronically Signed   By: Jacqulynn Cadet M.D.   On: 02/11/2016 20:06    Microbiology: No results found for this or any previous visit (from the past 240 hour(s)).   Labs: Basic Metabolic Panel:  Recent Labs Lab 02/11/16 1550 02/11/16 2346 02/12/16 0625  NA 140  --  141  K 3.6  --  3.5  CL 107  --  107  CO2 24  --  25  GLUCOSE 100*  --  112*  BUN 10  --  12   CREATININE 0.90 0.84 0.93  CALCIUM 10.0  --  10.0   Liver Function Tests:  Recent Labs Lab 02/11/16 1550 02/12/16 0625  AST 20 19  ALT 16 16  ALKPHOS 88 82  BILITOT 0.5 0.5  PROT 7.5 6.8  ALBUMIN 3.0* 2.7*   No results for input(s): LIPASE, AMYLASE in the last 168 hours. No results for input(s): AMMONIA in the last 168 hours. CBC:  Recent Labs Lab 02/11/16 1550 02/11/16 2346 02/12/16 0625  WBC 8.2 8.6 7.9  NEUTROABS 6.3  --   --   HGB 12.7 13.2 12.2  HCT 40.9 41.4 38.5  MCV 96.0 95.2 95.5  PLT 252 244 225   Cardiac Enzymes:  Recent Labs Lab 02/11/16 1550  CKTOTAL 71*  TROPONINI <0.03   BNP: BNP (last 3 results)  Recent Labs  02/11/16 1550  BNP 38.0    ProBNP (last 3 results) No results for input(s): PROBNP  in the last 8760 hours.  CBG: No results for input(s): GLUCAP in the last 168 hours.     Signed:  Elmarie Shiley MD.  [facility] 02/13/2016, 1:02 PM

## 2016-02-13 NOTE — Evaluation (Signed)
Occupational Therapy Evaluation and Discharge Patient Details Name: Connie Miranda MRN: [id number] DOB: 1933/03/05 Today's Date: 02/13/2016    History of Present Illness Connie Miranda is a 82 y.o. female with history of chronic lymphedema, pemphigus, hypertension and previous stroke presents to the ER because of increasing weakness to ambulate.   Clinical Impression   Pt reports that at baseline she essentially bedbound and only gets up with a lot of help.  She is dependent in sponge bathing, dressing. She was elusive when asked about toileting.  No family available to confirm PLOF. Per chart, pt has private duty caregivers daily and plan is to return home.  Will defer to [facility] discretion any further OT.  Follow Up Recommendations  HHOT;Supervision/Assistance - 24 hour    Equipment Recommendations  None recommended by OT    Recommendations for Other Services       Precautions / Restrictions Precautions Precautions: Fall      Mobility Bed Mobility Overal bed mobility: Needs Assistance;+2 for physical assistance Bed Mobility: Rolling Rolling: Max assist            Transfers                      Balance                                            ADL Overall ADL's : At baseline, per pt report.                                             Vision     Perception     Praxis      Pertinent Vitals/Pain Pain Assessment: Faces Faces Pain Scale: Hurts whole lot Pain Location: LEs Pain Descriptors / Indicators: Aching Pain Intervention(s): Monitored during session     Hand Dominance Right   Extremity/Trunk Assessment Upper Extremity Assessment RUE Deficits / Details: Limited AAROM to about 80 with crepitus in shoulder, elbow flexion strength 3+/5 LUE Deficits / Details: AAROM about 100 shoulder flexion strength 2+/5 elbow flexion 3+/5   Lower Extremity Assessment Lower Extremity Assessment: Defer to PT  evaluation       Communication Communication Communication: No difficulties   Cognition Arousal/Alertness: Awake/alert Behavior During Therapy: WFL for tasks assessed/performed Overall Cognitive Status: No family/caregiver present to determine baseline cognitive functioning (pt giving varying stories about her PLOF to staff)                     General Comments       Exercises       Shoulder Instructions      Home Living Family/patient expects to be discharged to:: Private residence Living Arrangements: Non-relatives/Friends Available Help at Discharge: Personal care attendant;Home health Type of Home: House Home Access: Stairs to enter;Ramped entrance     Home Layout: Two level;Able to live on main level with bedroom/bathroom     Bathroom Shower/Tub: Tub/shower unit         Home Equipment: Walker - 2 wheels;Wheelchair - manual;Bedside commode   Additional Comments: lift chair      Prior Functioning/Environment Level of Independence: Needs assistance  Gait / Transfers Assistance Needed: reports she primarily stays in the bed, needs a lot of  help to get up ADL's / Homemaking Assistance Needed: Pt is able to self feed.  She is dependent in bed level bathing and dressing.         OT Diagnosis: Generalized weakness;Acute pain;Cognitive deficits   OT Problem List:     OT Treatment/Interventions:      OT Goals(Current goals can be found in the care plan section) Acute Rehab OT Goals Patient Stated Goal: to go home  OT Frequency:     Barriers to D/C:            Co-evaluation              End of Session Nurse Communication: Mobility status (PLOF)  Activity Tolerance: Patient tolerated treatment well Patient left: in bed;with call bell/phone within reach   Time: 1110-1132 OT Time Calculation (min): 22 min Charges:  OT General Charges $OT Visit: 1 Procedure OT Evaluation $OT Eval Moderate Complexity: 1 Procedure G-Codes: OT G-codes  **NOT FOR INPATIENT CLASS** Functional Assessment Tool Used: clinical judgement Functional Limitation: Self care Self Care Current Status [id number]): At least 80 percent but less than 100 percent impaired, limited or restricted Self Care Goal Status [id number]): At least 80 percent but less than 100 percent impaired, limited or restricted Self Care Discharge Status [phone]): At least 80 percent but less than 100 percent impaired, limited or restricted  Connie Miranda 02/13/2016, 11:44 AM  [phone]

## 2016-02-13 NOTE — Progress Notes (Signed)
Reviewed d/c instructions with patient including AVS printout - patient noted medications are what she took before admission with no new prescriptions. All questions answered. Will send AVS printout home with patient for a resource for her and her family. Referral to home care agency complete per CM.

## 2016-02-13 NOTE — Progress Notes (Addendum)
CSW consulted for ambulance transportation home- confirmed home address with RNCM.  PTAR packet made and put on chart.  Patient will discharge to home Anticipated discharge date: 2/15 Family notified:pt son Transportation by PTAR [phone])- RN to call when pt is ready for DC and family at home to accept  [facility] signing off.  Domenica Reamer, [place] Social Worker [phone]

## 2016-02-14 NOTE — Progress Notes (Signed)
PT G-Code Noted    02/12/16 1257  PT G-Codes **NOT FOR INPATIENT CLASS**  Functional Assessment Tool Used Clinical Judgement  Functional Limitation Mobility: Walking and moving around  Mobility: Walking and Moving Around Current Status [id number]) CL  Mobility: Walking and Moving Around Goal Status [phone]) CL  Mobility: Walking and Moving Around Discharge Status [phone]) Eustaquio Boyden, [place] [phone] 02/14/2016

## 2016-03-04 ENCOUNTER — Emergency Department (HOSPITAL_COMMUNITY)
Admission: EM | Admit: 2016-03-04 | Discharge: 2016-03-04 | Disposition: A | Discharge status: Home / Self Care | Payer: Medicare Other | Attending: Emergency Medicine | Admitting: Emergency Medicine

## 2016-03-04 ENCOUNTER — Encounter (HOSPITAL_COMMUNITY): Payer: Self-pay | Admitting: *Deleted

## 2016-03-04 DIAGNOSIS — Z7902 Long term (current) use of antithrombotics/antiplatelets: Secondary | ICD-10-CM | POA: Insufficient documentation

## 2016-03-04 DIAGNOSIS — Z8673 Personal history of transient ischemic attack (TIA), and cerebral infarction without residual deficits: Secondary | ICD-10-CM | POA: Diagnosis not present

## 2016-03-04 DIAGNOSIS — I159 Secondary hypertension, unspecified: Secondary | ICD-10-CM | POA: Diagnosis not present

## 2016-03-04 DIAGNOSIS — Z87891 Personal history of nicotine dependence: Secondary | ICD-10-CM | POA: Diagnosis not present

## 2016-03-04 DIAGNOSIS — Z880 Allergy status to penicillin: Secondary | ICD-10-CM | POA: Diagnosis not present

## 2016-03-04 DIAGNOSIS — M1990 Unspecified osteoarthritis, unspecified site: Secondary | ICD-10-CM | POA: Diagnosis not present

## 2016-03-04 DIAGNOSIS — Z79899 Other long term (current) drug therapy: Secondary | ICD-10-CM | POA: Insufficient documentation

## 2016-03-04 DIAGNOSIS — D510 Vitamin B12 deficiency anemia due to intrinsic factor deficiency: Secondary | ICD-10-CM | POA: Insufficient documentation

## 2016-03-04 DIAGNOSIS — I10 Essential (primary) hypertension: Secondary | ICD-10-CM | POA: Diagnosis present

## 2016-03-04 NOTE — Discharge Instructions (Signed)

## 2016-03-04 NOTE — ED Notes (Signed)
Pt arrives from Dr. Sherrell Puller office via [place]. Pt was sent to ED dt HTN. EMS reports a systolic of [id number]. Pt  States she has taken her BP meds today. Pt has no complaints at this time.

## 2016-03-04 NOTE — ED Notes (Signed)
Pt is in stable condition upon d/c and is escorted home via PTAR. 

## 2016-03-04 NOTE — ED Provider Notes (Signed)
CSN: [id number]     Arrival date & time 03/04/16  1306 History   First MD Initiated Contact with Patient 03/04/16 1318     Chief Complaint  Patient presents with  . Hypertension     (Consider location/radiation/quality/duration/timing/severity/associated sxs/prior Treatment) HPI   Connie Miranda is a 82 y.o. female here for evaluation of an episode of blood pressure, greater than 389 systolic, by home health nurse, at her home today. Was asymptomatic, at that time. She is comfortable now. She denies headache, weakness, dizziness, nausea or vomiting. She is here with a family member. There are no other known modifying factors.   Past Medical History  Diagnosis Date  . Hypertension   . Stroke [facility]) 09/2000    couldn't move her left foot which resolved  . Edema   . Pernicious anemia   . Glucose-6-phosphate dehydrogenase deficiency ([facility])     possible diagnosis of G6PD per patient "phosphate 6 deficiency"  . Osteoarthritis   . Edema of both legs 01/2016   Past Surgical History  Procedure Laterality Date  . Other surgical history      cyst from panty line removed as child  . Bone marrow biopsy  12/1966    for anemia  . Multiple tooth extractions     Family History  Problem Relation Age of Onset  . Diabetes Brother   . Diabetes Brother   . Heart failure Mother 17  . Heart attack Father 22   Social History  Substance Use Topics  . Smoking status: Former Smoker    Types: Cigarettes    Quit date: 12/29/1993  . Smokeless tobacco: Never Used  . Alcohol Use: No   OB History    No data available     Review of Systems  All other systems reviewed and are negative.     Allergies  Asa; Phosphate; Codeine; and Penicillins  Home Medications   Prior to Admission medications   Medication Sig Start Date End Date Taking? Authorizing Provider  acetaminophen (TYLENOL) 325 MG tablet Take 2 tablets (650 mg total) by mouth every 6 (six) hours as needed. 04/11/13  Yes Marianne L  York, PA-C  amLODipine (NORVASC) 10 MG tablet Take 1 tablet (10 mg total) by mouth daily. 03/24/13  Yes Lauree Chandler, NP  Biotin 1000 MCG tablet Take 1 tablet (1 mg total) by mouth daily. 03/24/13  Yes Lauree Chandler, NP  Calcium Carbonate-Vitamin D (CALCIUM + D PO) Take 1 tablet by mouth daily.   Yes Historical Provider, MD  clopidogrel (PLAVIX) 75 MG tablet Take 1 tablet (75 mg total) by mouth daily. 03/24/13  Yes Lauree Chandler, NP  cyanocobalamin (,VITAMIN B-12,) 1000 MCG/ML injection Inject 1 mL (1,000 mcg total) into the muscle every 30 (thirty) days. 03/24/13  Yes Lauree Chandler, NP  furosemide (LASIX) 40 MG tablet Take 1 tablet (40 mg total) by mouth daily with breakfast. 04/11/13  Yes Melton Alar, PA-C  mineral oil-hydrophilic petrolatum (AQUAPHOR) ointment Apply 1 application topically daily. To lower extremities 03/24/13  Yes Lauree Chandler, NP  Multiple Vitamin (MULTIVITAMIN WITH MINERALS) TABS Take 1 tablet by mouth daily. 03/24/13  Yes Lauree Chandler, NP  oxyCODONE (OXY IR/ROXICODONE) 5 MG immediate release tablet Take 1 tablet (5 mg total) by mouth every 4 (four) hours as needed. 04/11/13  Yes Bobby Rumpf York, PA-C  potassium chloride SA (K-DUR,KLOR-CON) 20 MEQ tablet Take 1 tablet (20 mEq total) by mouth daily. 04/11/13  Yes Marianne L  York, PA-C  zolpidem (AMBIEN) 5 MG tablet Take 1 tablet (5 mg total) by mouth at bedtime as needed for sleep (insomnia). 04/11/13  Yes Marianne L York, PA-C   BP 136/51 mmHg  Pulse 72  Temp(Src) 98.2 F (36.8 C) (Oral)  Resp 17  SpO2 100% Physical Exam  Constitutional: She is oriented to person, place, and time. She appears well-developed.  Frail, elderly. Moderate global weakness  HENT:  Head: Normocephalic and atraumatic.  Right Ear: External ear normal.  Left Ear: External ear normal.  Eyes: Conjunctivae and EOM are normal. Pupils are equal, round, and reactive to light.  Neck: Normal range of motion and phonation normal.  Neck supple.  Cardiovascular: Normal rate.   Pulmonary/Chest: Effort normal. She exhibits no bony tenderness.  Abdominal: Soft. There is no tenderness.  Musculoskeletal: Normal range of motion.  Neurological: She is alert and oriented to person, place, and time. No cranial nerve deficit or sensory deficit. She exhibits normal muscle tone. Coordination normal.  No dysarthria or aphasia  Skin: Skin is warm, dry and intact.  Psychiatric: She has a normal mood and affect. Her behavior is normal.  Nursing note and vitals reviewed.   ED Course  Procedures (including critical care time)  Medications - No data to display  Patient Vitals for the past 24 hrs:  BP Temp Temp src Pulse Resp SpO2  03/04/16 1324 (!) 136/51 mmHg 98.2 F (36.8 C) Oral 72 17 100 %      Labs Review Labs Reviewed - No data to display  Imaging Review No results found. I have personally reviewed and evaluated these images and lab results as part of my medical decision-making.   EKG Interpretation None      MDM   Final diagnoses:  Secondary hypertension, unspecified    Transient hypertension, otherwise patient at baseline. No indication for further evaluation in the emergency department setting.   Nursing Notes Reviewed/ Care Coordinated Applicable Imaging Reviewed Interpretation of Laboratory Data incorporated into ED treatment  The patient appears reasonably screened and/or stabilized for discharge and I doubt any other medical condition or other [facility] requiring further screening, evaluation, or treatment in the ED at this time prior to discharge.  Plan: Home Medications- usual; Home Treatments- rest; return here if the recommended treatment, does not improve the symptoms; Recommended follow up- PCP prn\     Daleen Bo, MD 03/04/16 1725

## 2016-03-04 NOTE — ED Notes (Signed)
Pt awaiting for ptar.

## 2017-04-05 ENCOUNTER — Encounter (HOSPITAL_COMMUNITY): Payer: Self-pay | Admitting: Emergency Medicine

## 2017-04-05 ENCOUNTER — Emergency Department (HOSPITAL_COMMUNITY): Payer: Medicare Other

## 2017-04-05 ENCOUNTER — Inpatient Hospital Stay (HOSPITAL_COMMUNITY)
Admission: EM | Admit: 2017-04-05 | Discharge: 2017-04-10 | DRG: 872 | Disposition: A | Discharge status: Skilled Nursing Facility | Payer: Medicare Other | Attending: Internal Medicine | Admitting: Internal Medicine

## 2017-04-05 DIAGNOSIS — E7401 von Gierke disease: Secondary | ICD-10-CM | POA: Diagnosis present

## 2017-04-05 DIAGNOSIS — E872 Acidosis: Secondary | ICD-10-CM | POA: Diagnosis present

## 2017-04-05 DIAGNOSIS — Z833 Family history of diabetes mellitus: Secondary | ICD-10-CM

## 2017-04-05 DIAGNOSIS — I119 Hypertensive heart disease without heart failure: Secondary | ICD-10-CM | POA: Diagnosis present

## 2017-04-05 DIAGNOSIS — K810 Acute cholecystitis: Secondary | ICD-10-CM

## 2017-04-05 DIAGNOSIS — Z8673 Personal history of transient ischemic attack (TIA), and cerebral infarction without residual deficits: Secondary | ICD-10-CM

## 2017-04-05 DIAGNOSIS — R29898 Other symptoms and signs involving the musculoskeletal system: Secondary | ICD-10-CM | POA: Diagnosis present

## 2017-04-05 DIAGNOSIS — Z7902 Long term (current) use of antithrombotics/antiplatelets: Secondary | ICD-10-CM

## 2017-04-05 DIAGNOSIS — I890 Lymphedema, not elsewhere classified: Secondary | ICD-10-CM

## 2017-04-05 DIAGNOSIS — Z885 Allergy status to narcotic agent status: Secondary | ICD-10-CM

## 2017-04-05 DIAGNOSIS — Z6841 Body Mass Index (BMI) 40.0 and over, adult: Secondary | ICD-10-CM

## 2017-04-05 DIAGNOSIS — Z888 Allergy status to other drugs, medicaments and biological substances status: Secondary | ICD-10-CM

## 2017-04-05 DIAGNOSIS — K8000 Calculus of gallbladder with acute cholecystitis without obstruction: Secondary | ICD-10-CM | POA: Diagnosis present

## 2017-04-05 DIAGNOSIS — Z7401 Bed confinement status: Secondary | ICD-10-CM

## 2017-04-05 DIAGNOSIS — E876 Hypokalemia: Secondary | ICD-10-CM | POA: Diagnosis not present

## 2017-04-05 DIAGNOSIS — I10 Essential (primary) hypertension: Secondary | ICD-10-CM | POA: Diagnosis present

## 2017-04-05 DIAGNOSIS — A419 Sepsis, unspecified organism: Secondary | ICD-10-CM

## 2017-04-05 DIAGNOSIS — Z87891 Personal history of nicotine dependence: Secondary | ICD-10-CM

## 2017-04-05 DIAGNOSIS — Z880 Allergy status to penicillin: Secondary | ICD-10-CM

## 2017-04-05 DIAGNOSIS — B9620 Unspecified Escherichia coli [E. coli] as the cause of diseases classified elsewhere: Secondary | ICD-10-CM | POA: Diagnosis present

## 2017-04-05 DIAGNOSIS — R1011 Right upper quadrant pain: Secondary | ICD-10-CM | POA: Diagnosis not present

## 2017-04-05 DIAGNOSIS — N390 Urinary tract infection, site not specified: Secondary | ICD-10-CM

## 2017-04-05 DIAGNOSIS — K819 Cholecystitis, unspecified: Secondary | ICD-10-CM

## 2017-04-05 DIAGNOSIS — M1990 Unspecified osteoarthritis, unspecified site: Secondary | ICD-10-CM | POA: Diagnosis present

## 2017-04-05 DIAGNOSIS — D75A Glucose-6-phosphate dehydrogenase (G6PD) deficiency without anemia: Secondary | ICD-10-CM | POA: Diagnosis present

## 2017-04-05 DIAGNOSIS — Z886 Allergy status to analgesic agent status: Secondary | ICD-10-CM

## 2017-04-05 DIAGNOSIS — R109 Unspecified abdominal pain: Secondary | ICD-10-CM | POA: Diagnosis present

## 2017-04-05 DIAGNOSIS — D510 Vitamin B12 deficiency anemia due to intrinsic factor deficiency: Secondary | ICD-10-CM | POA: Diagnosis present

## 2017-04-05 DIAGNOSIS — Z8249 Family history of ischemic heart disease and other diseases of the circulatory system: Secondary | ICD-10-CM

## 2017-04-05 LAB — URINALYSIS, ROUTINE W REFLEX MICROSCOPIC
Bilirubin Urine: NEGATIVE
GLUCOSE, UA: NEGATIVE mg/dL
KETONES UR: NEGATIVE mg/dL
LEUKOCYTES UA: NEGATIVE
NITRITE: POSITIVE — AB
PH: 5 (ref 5.0–8.0)
PROTEIN: 100 mg/dL — AB
Specific Gravity, Urine: 1.018 (ref 1.005–1.030)

## 2017-04-05 LAB — CBC
HEMATOCRIT: 37.9 % (ref 36.0–46.0)
HEMOGLOBIN: 12.5 g/dL (ref 12.0–15.0)
MCH: 30.6 pg (ref 26.0–34.0)
MCHC: 33 g/dL (ref 30.0–36.0)
MCV: 92.7 fL (ref 78.0–100.0)
Platelets: 315 10*3/uL (ref 150–400)
RBC: 4.09 MIL/uL (ref 3.87–5.11)
RDW: 14.1 % (ref 11.5–15.5)
WBC: 19 10*3/uL — ABNORMAL HIGH (ref 4.0–10.5)

## 2017-04-05 LAB — DIFFERENTIAL
Basophils Absolute: 0 10*3/uL (ref 0.0–0.1)
Basophils Relative: 0 %
EOS PCT: 0 %
Eosinophils Absolute: 0 10*3/uL (ref 0.0–0.7)
LYMPHS PCT: 4 %
Lymphs Abs: 0.7 10*3/uL (ref 0.7–4.0)
MONO ABS: 1.3 10*3/uL — AB (ref 0.1–1.0)
MONOS PCT: 7 %
Neutro Abs: 17.2 10*3/uL — ABNORMAL HIGH (ref 1.7–7.7)
Neutrophils Relative %: 89 %

## 2017-04-05 LAB — COMPREHENSIVE METABOLIC PANEL
ALBUMIN: 3.4 g/dL — AB (ref 3.5–5.0)
ALK PHOS: 79 U/L (ref 38–126)
ALT: 14 U/L (ref 14–54)
ANION GAP: 7 (ref 5–15)
AST: 36 U/L (ref 15–41)
BILIRUBIN TOTAL: 2.2 mg/dL — AB (ref 0.3–1.2)
BUN: 16 mg/dL (ref 6–20)
CALCIUM: 9.5 mg/dL (ref 8.9–10.3)
CO2: 26 mmol/L (ref 22–32)
Chloride: 102 mmol/L (ref 101–111)
Creatinine, Ser: 0.85 mg/dL (ref 0.44–1.00)
GFR calc Af Amer: >60 mL/min (ref >60)
GFR calc non Af Amer: >60 mL/min (ref >60)
GLUCOSE: 154 mg/dL — AB (ref 65–99)
POTASSIUM: 4.2 mmol/L (ref 3.5–5.1)
SODIUM: 135 mmol/L (ref 135–145)
Total Protein: 8.5 g/dL — ABNORMAL HIGH (ref 6.5–8.1)

## 2017-04-05 LAB — CBG MONITORING, ED: Glucose-Capillary: 136 mg/dL — ABNORMAL HIGH (ref 65–99)

## 2017-04-05 LAB — I-STAT CG4 LACTIC ACID, ED: Lactic Acid, Venous: 2.8 mmol/L (ref 0.5–1.9)

## 2017-04-05 LAB — LIPASE, BLOOD: Lipase: 14 U/L (ref 11–51)

## 2017-04-05 MED ORDER — METRONIDAZOLE IN NACL 5-0.79 MG/ML-% IV SOLN
500.0000 mg | Freq: Three times a day (TID) | INTRAVENOUS | Status: DC
  Administered 2017-04-06 – 2017-04-09 (×10): 500 mg via INTRAVENOUS
  Filled 2017-04-05 (×11): qty 100

## 2017-04-05 MED ORDER — METRONIDAZOLE IN NACL 5-0.79 MG/ML-% IV SOLN
500.0000 mg | Freq: Once | INTRAVENOUS | Status: AC
  Administered 2017-04-05: 500 mg via INTRAVENOUS
  Filled 2017-04-05: qty 100

## 2017-04-05 MED ORDER — LEVOFLOXACIN IN D5W 750 MG/150ML IV SOLN
750.0000 mg | INTRAVENOUS | Status: DC
  Administered 2017-04-06 – 2017-04-08 (×3): 750 mg via INTRAVENOUS
  Filled 2017-04-05 (×3): qty 150

## 2017-04-05 MED ORDER — LEVOFLOXACIN IN D5W 750 MG/150ML IV SOLN
750.0000 mg | Freq: Once | INTRAVENOUS | Status: AC
  Administered 2017-04-05: 750 mg via INTRAVENOUS
  Filled 2017-04-05: qty 150

## 2017-04-05 MED ORDER — IOPAMIDOL (ISOVUE-300) INJECTION 61%
INTRAVENOUS | Status: AC
  Administered 2017-04-05: 100 mL via INTRAVENOUS
  Filled 2017-04-05: qty 100

## 2017-04-05 NOTE — ED Provider Notes (Signed)
[place] DEPT Provider Note   CSN: [id number] Arrival date & time: 04/05/17  1859     History   Chief Complaint Chief Complaint  Patient presents with  . Altered Mental Status    HPI Connie Miranda is a 83 y.o. female.  HPI Patient is bedridden at baseline and cared for in her home by a caregiver. Caregiver reports that the patient normally is very alert and interactive. Yesterday she began having vomiting, multiple recurrent episodes all day. Caregiver reports minimal amount of diarrhea. He reports she's having some diffuse central abdominal pain. Aching in nature. She denies cough or dyspnea or chest pain. She denies pain or burning with urination. Caregiver notes that today the patient seems slightly confused which is atypical for her. Patient chronically has significant lower extremity swelling with lymphedema. Her giver thinks left lower extremity may be slightly more swollen than baseline. Patient is nonambulatory. Past Medical History:  Diagnosis Date  . Edema   . Edema of both legs 01/2016  . Glucose-6-phosphate dehydrogenase deficiency (HCC)    possible diagnosis of G6PD per patient "phosphate 6 deficiency"  . Hypertension   . Osteoarthritis   . Pernicious anemia   . Stroke [facility]) 09/2000   couldn't move her left foot which resolved    Patient Active Problem List   Diagnosis Date Noted  . Lower extremity weakness 02/11/2016  . Difficulty walking 04/08/2013  . Weakness of both legs 04/08/2013  . Morbid obesity ([place]) 04/08/2013  . Acute renal failure ([place]) 01/28/2013  . Hyperkalemia 01/28/2013  . Fall 01/28/2013  . Nausea 01/28/2013  . Lymphedema 10/16/2012  . Cellulitis 10/16/2012  . Diarrhea 10/13/2012  . Cellulitis of left lower leg 10/12/2012  . Anemia 10/12/2012  . Abdominal pain 10/12/2012  . Hip pain 10/12/2012  . Hypertension   . Edema   . Pernicious anemia   . Glucose-6-phosphate dehydrogenase deficiency ([place])   . Osteoarthritis   . Stroke  [facility]) 09/28/2000    Past Surgical History:  Procedure Laterality Date  . BONE MARROW BIOPSY  12/1966   for anemia  . MULTIPLE TOOTH EXTRACTIONS    . OTHER SURGICAL HISTORY     cyst from panty line removed as child    OB History    No data available       Home Medications    Prior to Admission medications   Medication Sig Start Date End Date Taking? Authorizing Provider  acetaminophen (TYLENOL) 325 MG tablet Take 2 tablets (650 mg total) by mouth every 6 (six) hours as needed. Patient taking differently: Take 650 mg by mouth every 6 (six) hours as needed for mild pain or moderate pain.  04/11/13  Yes Marianne L York, PA-C  amLODipine (NORVASC) 10 MG tablet Take 1 tablet (10 mg total) by mouth daily. 03/24/13  Yes Lauree Chandler, NP  Biotin 1000 MCG tablet Take 1 tablet (1 mg total) by mouth daily. 03/24/13  Yes Lauree Chandler, NP  Calcium Carbonate-Vitamin D (CALCIUM + D PO) Take 1 tablet by mouth daily.   Yes Historical Provider, MD  clopidogrel (PLAVIX) 75 MG tablet Take 1 tablet (75 mg total) by mouth daily. 03/24/13  Yes Lauree Chandler, NP  cyanocobalamin (,VITAMIN B-12,) 1000 MCG/ML injection Inject 1 mL (1,000 mcg total) into the muscle every 30 (thirty) days. 03/24/13  Yes Lauree Chandler, NP  furosemide (LASIX) 40 MG tablet Take 1 tablet (40 mg total) by mouth daily with breakfast. 04/11/13  Yes Bobby Rumpf  York, PA-C  mineral oil-hydrophilic petrolatum (AQUAPHOR) ointment Apply 1 application topically daily. To lower extremities 03/24/13  Yes Lauree Chandler, NP  Multiple Vitamin (MULTIVITAMIN WITH MINERALS) TABS Take 1 tablet by mouth daily. 03/24/13  Yes Lauree Chandler, NP  potassium chloride SA (K-DUR,KLOR-CON) 20 MEQ tablet Take 1 tablet (20 mEq total) by mouth daily. 04/11/13  Yes Bobby Rumpf York, PA-C  zolpidem (AMBIEN) 5 MG tablet Take 1 tablet (5 mg total) by mouth at bedtime as needed for sleep (insomnia). 04/11/13  Yes Melton Alar, PA-C    Family  History Family History  Problem Relation Age of Onset  . Diabetes Brother   . Diabetes Brother   . Heart failure Mother 57  . Heart attack Father 52    Social History Social History  Substance Use Topics  . Smoking status: Former Smoker    Types: Cigarettes    Quit date: 12/29/1993  . Smokeless tobacco: Never Used  . Alcohol use No     Allergies   Asa [aspirin]; Phosphate; Codeine; and Penicillins   Review of Systems Review of Systems 10 Systems reviewed and are negative for acute change except as noted in the HPI.   Physical Exam Updated Vital Signs BP (!) 161/61   Pulse 92   Temp (S) 99.3 F (37.4 C) (Oral)   Resp 19   Ht 5' 3"  (1.6 m)   Wt 250 lb (113.4 kg)   SpO2 95%   BMI 44.29 kg/m   Physical Exam  Constitutional: She is oriented to person, place, and time.  Patient is morbidly obese. Alert. Interactive. Mild increased work of breathing at rest.  HENT:  Head: Normocephalic and atraumatic.  Mouth/Throat: Oropharynx is clear and moist.  Eyes: EOM are normal. Pupils are equal, round, and reactive to light.  Small amount of purulent looking drainage from the left eye. No lid edema or injection of the eye.  Neck: Neck supple.  Cardiovascular:  Distant heart sounds. Regular.  Pulmonary/Chest:  Mild tachypnea with increased work of breathing. Lungs clear to anterior auscultation.  Abdominal:  Abdomen is morbidly obese. Soft with moderate diffuse central and lower abdominal discomfort to palpation. No active rash within the intertriginous folds.  Musculoskeletal:  Very large lymphedema bilateral lower extremities. Skin thickening and hypertrophic keratinous appearance. No open wounds.  Neurological: She is alert and oriented to person, place, and time. No cranial nerve deficit. Coordination normal.  Skin: Skin is warm and dry.  Psychiatric: She has a normal mood and affect.     ED Treatments / Results  Labs (all labs ordered are listed, but only abnormal  results are displayed) Labs Reviewed  COMPREHENSIVE METABOLIC PANEL - Abnormal; Notable for the following:       Result Value   Glucose, Bld 154 (*)    Total Protein 8.5 (*)    Albumin 3.4 (*)    Total Bilirubin 2.2 (*)    All other components within normal limits  CBC - Abnormal; Notable for the following:    WBC 19.0 (*)    All other components within normal limits  URINALYSIS, ROUTINE W REFLEX MICROSCOPIC - Abnormal; Notable for the following:    Color, Urine AMBER (*)    APPearance HAZY (*)    Hgb urine dipstick MODERATE (*)    Protein, ur 100 (*)    Nitrite POSITIVE (*)    Bacteria, UA MANY (*)    Squamous Epithelial / LPF 0-5 (*)    All other  components within normal limits  DIFFERENTIAL - Abnormal; Notable for the following:    Neutro Abs 17.2 (*)    Monocytes Absolute 1.3 (*)    All other components within normal limits  CBG MONITORING, ED - Abnormal; Notable for the following:    Glucose-Capillary 136 (*)    All other components within normal limits  I-STAT CG4 LACTIC ACID, ED - Abnormal; Notable for the following:    Lactic Acid, Venous 2.80 (*)    All other components within normal limits  CULTURE, BLOOD (ROUTINE X 2)  CULTURE, BLOOD (ROUTINE X 2)  URINE CULTURE  LIPASE, BLOOD  I-STAT CG4 LACTIC ACID, ED  I-STAT CG4 LACTIC ACID, ED    EKG  EKG Interpretation None       Radiology Ct Abdomen Pelvis W Contrast  Result Date: 04/05/2017 CLINICAL DATA:  83 y/o  F; right upper quadrant pain with vomiting. EXAM: CT ABDOMEN AND PELVIS WITH CONTRAST TECHNIQUE: Multidetector CT imaging of the abdomen and pelvis was performed using the standard protocol following bolus administration of intravenous contrast. CONTRAST:  100 ISOVUE-300 IOPAMIDOL (ISOVUE-300) INJECTION 61% COMPARISON:  10/11/2012 CT abdomen and pelvis. FINDINGS: Lower chest: Negative. Hepatobiliary: Scattered punctate calcifications compatible with old granulomatous disease. No additional focal liver  lesion identified. Wall thickening and surrounding inflammatory fat stranding of distended gallbladder. No intrahepatic biliary ductal dilatation. No radiopaque gallstone identified. Pancreas: Unremarkable. No pancreatic ductal dilatation or surrounding inflammatory changes. Spleen: Punctate calcifications compatible sequelae of prior granulomatous disease. Adrenals/Urinary Tract: Stable left kidney interpolar 11 mm cyst. No hydronephrosis. No appreciable urinary stone disease. Normal underdistended bladder. Normal adrenal glands. Stomach/Bowel: Small hiatal hernia. Moderate sigmoid diverticulosis without evidence for diverticulitis. No obstructive or inflammatory changes of the bowel. Normal appendix. Vascular/Lymphatic: Aortic atherosclerosis with moderate calcification. No enlarged abdominal or pelvic lymph nodes. Reproductive: Stable position of intrauterine device. No adnexal mass identified. Other: No abdominal wall hernia or abnormality. No abdominopelvic ascites. Musculoskeletal: Left iliopsoas muscle atrophy. Diffuse sclerosis of the lumbar vertebral bodies and advanced lumbar degenerative changes with both discogenic disease and advanced facet arthropathy. Multifactorial moderate to severe canal stenosis at the L3-4 through L5-S1 levels. No acute fracture identified. IMPRESSION: 1. Wall thickening and surrounding inflammatory changes of dilated gallbladder may represent acute cholecystitis in the appropriate clinical setting. 2. Small hiatal hernia. 3. Moderate sigmoid diverticulosis. 4. Moderate calcific aortic atherosclerosis. 5. Left iliopsoas atrophy either related to disuse or denervation. 6. Extensive sclerotic changes of the lumbar vertebral bodies, question sclerotic metastatic disease to the bones, bony infarcts, or advanced degenerative disease. No acute fracture identified. 7. Lumbar spondylosis with moderate to severe canal stenosis at the L3-4 through L5-S1 levels. Electronically Signed   By:  Kristine Garbe M.D.   On: 04/05/2017 23:27   Dg Chest Port 1 View  Result Date: 04/05/2017 CLINICAL DATA:  Acute onset of vomiting and diarrhea. Left-sided chest pain. Initial encounter. EXAM: PORTABLE CHEST 1 VIEW COMPARISON:  Chest radiograph performed 02/11/2016 FINDINGS: The lungs are hypoexpanded. Mild vascular crowding and vascular congestion are seen. Increased interstitial markings could reflect minimal interstitial edema. There is no evidence of pleural effusion or pneumothorax. The cardiomediastinal silhouette is within normal limits. No acute osseous abnormalities are seen. Chronic degenerative change is noted at the glenohumeral joints, with joint space loss and sclerosis. IMPRESSION: 1. Lungs hypoexpanded. Vascular congestion. Increased interstitial markings could reflect minimal interstitial edema. 2. Chronic degenerative change at the glenohumeral joints, with joint space loss and sclerosis. Electronically Signed   By:  Garald Balding M.D.   On: 04/05/2017 21:34    Procedures Procedures (including critical care time) CRITICAL CARE Performed by: Charlesetta Shanks   Total critical care time: 28mnutes  Critical care time was exclusive of separately billable procedures and treating other patients.  Critical care was necessary to treat or prevent imminent or life-threatening deterioration.  Critical care was time spent personally by me on the following activities: development of treatment plan with patient and/or surrogate as well as nursing, discussions with consultants, evaluation of patient's response to treatment, examination of patient, obtaining history from patient or surrogate, ordering and performing treatments and interventions, ordering and review of laboratory studies, ordering and review of radiographic studies, pulse oximetry and re-evaluation of patient's condition. Medications Ordered in ED Medications  levofloxacin (LEVAQUIN) IVPB 750 mg (not administered)   metroNIDAZOLE (FLAGYL) IVPB 500 mg (not administered)  0.9 %  sodium chloride infusion (0 mLs Intravenous Stopped 04/05/17 2130)    Followed by  0.9 %  sodium chloride infusion (1,000 mLs Intravenous New Bag/Given 04/06/17 0031)  levofloxacin (LEVAQUIN) IVPB 750 mg (0 mg Intravenous Stopped 04/05/17 2358)  metroNIDAZOLE (FLAGYL) IVPB 500 mg (0 mg Intravenous Stopped 04/05/17 2359)  iopamidol (ISOVUE-300) 61 % injection (100 mLs Intravenous Contrast Given 04/05/17 2222)     Initial Impression / Assessment and Plan / ED Course  I have reviewed the triage vital signs and the nursing notes.  Pertinent labs & imaging results that were available during my care of the patient were reviewed by me and considered in my medical decision making (see chart for details).    Consult: Dr. RRosendo Groshas seen the patient in consultation. Requests ultrasound and will reevaluate. Consult: [facility] hospitalist Dr. KHal Hopefor admission.   Final Clinical Impressions(s) / ED Diagnoses   Final diagnoses:  Sepsis, due to unspecified organism ([facility]  Lower urinary tract infectious disease   Patient presents with recurrent vomiting and abdominal pain yesterday and today. Patient has significant leukocytosis, fever and lactic acidosis. UA is positive. Patient also had gallbladder wall thickening on CT. Treatment has been initiated for intra-abdominal infection. Patient does remain alert and appropriate without respiratory distress at rest. Patient will be admitted for sepsis with UTI and continued evaluation for cholecystitis. New Prescriptions New Prescriptions   No medications on file     MCharlesetta Shanks MD 04/06/17 [phone]

## 2017-04-05 NOTE — ED Triage Notes (Signed)
Brought in by EMS from home with c/o emesis and diarrhea since yesterday morning.  Pt has a private caregiver who reported that pt "is very confused today, wherein she is usually with it and you can ask anything and you get the right response".

## 2017-04-05 NOTE — Progress Notes (Signed)
Pharmacy Antibiotic Note  Connie Miranda is a 83 y.o. female c/o vomiting and diarrhea admitted on 04/05/2017 with intra-abdominal  infection.  Pharmacy has been consulted for flagyl/levaquin dosing.  Plan: Flagyl 500 mg IV q8h Levaquin 750 mg IV q24h F/u scr/cultures as needed     Temp (24hrs), Avg:101.2 F (38.4 C), Min:101.2 F (38.4 C), Max:101.2 F (38.4 C)   Recent Labs Lab 04/05/17 2015 04/05/17 2116  WBC 19.0*  --   CREATININE 0.85  --   LATICACIDVEN  --  2.80*    CrCl cannot be calculated (Unknown ideal weight.).    Allergies  Allergen Reactions  . Asa [Aspirin] Other (See Comments)    Doctor told her not to take due to type anemia  . Phosphate Other (See Comments)    Anything containing phosphate 6. Unknown reaction  . Codeine Other (See Comments)    Made her too sleepy  . Penicillins Itching and Swelling    Antimicrobials this admission: 4/8 flagyl >>  4/8 levaquin >>   Dose adjustments this admission:   Microbiology results:  BCx:   UCx:    Sputum:    MRSA PCR:   Thank you for allowing pharmacy to be a part of this patient's care.  [name] 04/05/2017 10:06 PM

## 2017-04-05 NOTE — ED Notes (Signed)
EKG given to EDP,Jacubowitz,MD., for review. 

## 2017-04-06 ENCOUNTER — Emergency Department (HOSPITAL_COMMUNITY): Payer: Medicare Other

## 2017-04-06 ENCOUNTER — Inpatient Hospital Stay (HOSPITAL_COMMUNITY): Payer: Medicare Other

## 2017-04-06 ENCOUNTER — Encounter (HOSPITAL_COMMUNITY): Payer: Self-pay | Admitting: Internal Medicine

## 2017-04-06 DIAGNOSIS — E7401 von Gierke disease: Secondary | ICD-10-CM | POA: Diagnosis present

## 2017-04-06 DIAGNOSIS — Z7401 Bed confinement status: Secondary | ICD-10-CM | POA: Diagnosis not present

## 2017-04-06 DIAGNOSIS — K810 Acute cholecystitis: Secondary | ICD-10-CM | POA: Diagnosis not present

## 2017-04-06 DIAGNOSIS — K8000 Calculus of gallbladder with acute cholecystitis without obstruction: Secondary | ICD-10-CM | POA: Diagnosis present

## 2017-04-06 DIAGNOSIS — Z885 Allergy status to narcotic agent status: Secondary | ICD-10-CM | POA: Diagnosis not present

## 2017-04-06 DIAGNOSIS — D550 Anemia due to glucose-6-phosphate dehydrogenase [G6PD] deficiency: Secondary | ICD-10-CM | POA: Diagnosis not present

## 2017-04-06 DIAGNOSIS — Z8249 Family history of ischemic heart disease and other diseases of the circulatory system: Secondary | ICD-10-CM | POA: Diagnosis not present

## 2017-04-06 DIAGNOSIS — Z8673 Personal history of transient ischemic attack (TIA), and cerebral infarction without residual deficits: Secondary | ICD-10-CM | POA: Diagnosis not present

## 2017-04-06 DIAGNOSIS — R29898 Other symptoms and signs involving the musculoskeletal system: Secondary | ICD-10-CM | POA: Diagnosis not present

## 2017-04-06 DIAGNOSIS — I119 Hypertensive heart disease without heart failure: Secondary | ICD-10-CM | POA: Diagnosis present

## 2017-04-06 DIAGNOSIS — Z6841 Body Mass Index (BMI) 40.0 and over, adult: Secondary | ICD-10-CM | POA: Diagnosis not present

## 2017-04-06 DIAGNOSIS — A4151 Sepsis due to Escherichia coli [E. coli]: Secondary | ICD-10-CM | POA: Diagnosis not present

## 2017-04-06 DIAGNOSIS — Z833 Family history of diabetes mellitus: Secondary | ICD-10-CM | POA: Diagnosis not present

## 2017-04-06 DIAGNOSIS — Z87891 Personal history of nicotine dependence: Secondary | ICD-10-CM | POA: Diagnosis not present

## 2017-04-06 DIAGNOSIS — Z880 Allergy status to penicillin: Secondary | ICD-10-CM | POA: Diagnosis not present

## 2017-04-06 DIAGNOSIS — D510 Vitamin B12 deficiency anemia due to intrinsic factor deficiency: Secondary | ICD-10-CM | POA: Diagnosis present

## 2017-04-06 DIAGNOSIS — I10 Essential (primary) hypertension: Secondary | ICD-10-CM | POA: Diagnosis not present

## 2017-04-06 DIAGNOSIS — R1011 Right upper quadrant pain: Secondary | ICD-10-CM | POA: Diagnosis present

## 2017-04-06 DIAGNOSIS — E872 Acidosis: Secondary | ICD-10-CM | POA: Diagnosis present

## 2017-04-06 DIAGNOSIS — A419 Sepsis, unspecified organism: Secondary | ICD-10-CM | POA: Diagnosis present

## 2017-04-06 DIAGNOSIS — I5032 Chronic diastolic (congestive) heart failure: Secondary | ICD-10-CM | POA: Diagnosis not present

## 2017-04-06 DIAGNOSIS — Z7902 Long term (current) use of antithrombotics/antiplatelets: Secondary | ICD-10-CM | POA: Diagnosis not present

## 2017-04-06 DIAGNOSIS — Z886 Allergy status to analgesic agent status: Secondary | ICD-10-CM | POA: Diagnosis not present

## 2017-04-06 DIAGNOSIS — E876 Hypokalemia: Secondary | ICD-10-CM | POA: Diagnosis not present

## 2017-04-06 DIAGNOSIS — Z888 Allergy status to other drugs, medicaments and biological substances status: Secondary | ICD-10-CM | POA: Diagnosis not present

## 2017-04-06 DIAGNOSIS — N390 Urinary tract infection, site not specified: Secondary | ICD-10-CM | POA: Diagnosis present

## 2017-04-06 DIAGNOSIS — M1990 Unspecified osteoarthritis, unspecified site: Secondary | ICD-10-CM | POA: Diagnosis present

## 2017-04-06 DIAGNOSIS — I890 Lymphedema, not elsewhere classified: Secondary | ICD-10-CM | POA: Diagnosis present

## 2017-04-06 DIAGNOSIS — B9620 Unspecified Escherichia coli [E. coli] as the cause of diseases classified elsewhere: Secondary | ICD-10-CM | POA: Diagnosis present

## 2017-04-06 DIAGNOSIS — K819 Cholecystitis, unspecified: Secondary | ICD-10-CM | POA: Diagnosis not present

## 2017-04-06 LAB — CBC WITH DIFFERENTIAL/PLATELET
BASOS ABS: 0 10*3/uL (ref 0.0–0.1)
Basophils Relative: 0 %
Eosinophils Absolute: 0 10*3/uL (ref 0.0–0.7)
Eosinophils Relative: 0 %
HCT: 41.8 % (ref 36.0–46.0)
HEMOGLOBIN: 13.8 g/dL (ref 12.0–15.0)
LYMPHS ABS: 1 10*3/uL (ref 0.7–4.0)
LYMPHS PCT: 6 %
MCH: 31.7 pg (ref 26.0–34.0)
MCHC: 33 g/dL (ref 30.0–36.0)
MCV: 96.1 fL (ref 78.0–100.0)
Monocytes Absolute: 0.9 10*3/uL (ref 0.1–1.0)
Monocytes Relative: 5 %
NEUTROS PCT: 89 %
Neutro Abs: 15.6 10*3/uL — ABNORMAL HIGH (ref 1.7–7.7)
PLATELETS: 252 10*3/uL (ref 150–400)
RBC: 4.35 MIL/uL (ref 3.87–5.11)
RDW: 14.2 % (ref 11.5–15.5)
WBC: 17.6 10*3/uL — AB (ref 4.0–10.5)

## 2017-04-06 LAB — BASIC METABOLIC PANEL
Anion gap: 8 (ref 5–15)
BUN: 15 mg/dL (ref 6–20)
CALCIUM: 9.4 mg/dL (ref 8.9–10.3)
CHLORIDE: 102 mmol/L (ref 101–111)
CO2: 26 mmol/L (ref 22–32)
CREATININE: 0.76 mg/dL (ref 0.44–1.00)
Glucose, Bld: 110 mg/dL — ABNORMAL HIGH (ref 65–99)
Potassium: 3.3 mmol/L — ABNORMAL LOW (ref 3.5–5.1)
SODIUM: 136 mmol/L (ref 135–145)

## 2017-04-06 LAB — HEPATIC FUNCTION PANEL
ALBUMIN: 3.1 g/dL — AB (ref 3.5–5.0)
ALT: 16 U/L (ref 14–54)
AST: 24 U/L (ref 15–41)
Alkaline Phosphatase: 76 U/L (ref 38–126)
BILIRUBIN INDIRECT: 0.9 mg/dL (ref 0.3–0.9)
Bilirubin, Direct: 0.4 mg/dL (ref 0.1–0.5)
TOTAL PROTEIN: 8 g/dL (ref 6.5–8.1)
Total Bilirubin: 1.3 mg/dL — ABNORMAL HIGH (ref 0.3–1.2)

## 2017-04-06 LAB — GLUCOSE, CAPILLARY: Glucose-Capillary: 121 mg/dL — ABNORMAL HIGH (ref 65–99)

## 2017-04-06 LAB — I-STAT CG4 LACTIC ACID, ED: LACTIC ACID, VENOUS: 1.89 mmol/L (ref 0.5–1.9)

## 2017-04-06 MED ORDER — SODIUM CHLORIDE 0.9 % IV SOLN
INTRAVENOUS | Status: AC
  Administered 2017-04-06 (×2): via INTRAVENOUS

## 2017-04-06 MED ORDER — SODIUM CHLORIDE 0.9 % IV SOLN
1000.0000 mL | Freq: Once | INTRAVENOUS | Status: AC
  Administered 2017-04-05: 1000 mL via INTRAVENOUS

## 2017-04-06 MED ORDER — SODIUM CHLORIDE 0.9 % IV SOLN
1000.0000 mL | INTRAVENOUS | Status: DC
  Administered 2017-04-06: 1000 mL via INTRAVENOUS

## 2017-04-06 MED ORDER — HYDRALAZINE HCL 20 MG/ML IJ SOLN: 10.0000 mg | INTRAMUSCULAR | Status: DC | PRN

## 2017-04-06 MED ORDER — ACETAMINOPHEN 650 MG RE SUPP: 650.0000 mg | Freq: Four times a day (QID) | RECTAL | Status: DC | PRN

## 2017-04-06 MED ORDER — ACETAMINOPHEN 325 MG PO TABS
650.0000 mg | ORAL_TABLET | Freq: Four times a day (QID) | ORAL | Status: DC | PRN
  Administered 2017-04-10: 650 mg via ORAL
  Filled 2017-04-06: qty 2

## 2017-04-06 MED ORDER — ONDANSETRON HCL 4 MG/2ML IJ SOLN: 4.0000 mg | Freq: Four times a day (QID) | INTRAMUSCULAR | Status: DC | PRN

## 2017-04-06 MED ORDER — ONDANSETRON HCL 4 MG PO TABS: 4.0000 mg | ORAL_TABLET | Freq: Four times a day (QID) | ORAL | Status: DC | PRN

## 2017-04-06 NOTE — ED Notes (Signed)
Dr. Kakrakandy, hospitalist, at bedside. 

## 2017-04-06 NOTE — Progress Notes (Addendum)
Patient seen and examined, she denies pain, no vomiting,  fever subsided, NAD, but not oriented. Continue abx, General surgery input appreciated.

## 2017-04-06 NOTE — ED Notes (Signed)
Pt's brother:  Lasasha Brophy---- tel# [phone]

## 2017-04-06 NOTE — ED Notes (Signed)
General surgeon at bedside.  

## 2017-04-06 NOTE — ED Notes (Signed)
Pt's contact and POA, Genelle Gather, was called and was made awrae of pt's condition and current treatment at this time.

## 2017-04-06 NOTE — Progress Notes (Signed)
[facility] Progress Note     Subjective: CC: fever  Patient currently denies abdominal pain. Denies nausea, vomiting, abdominal distention, or diarrhea. States that she has been bedbound  for years. At baseline she reports having bowel movements daily.   Objective: Vital signs in last 24 hours: Temp:  [99.3 F (37.4 C)-101.2 F (38.4 C)] 99.5 F (37.5 C) (04/09 0224) Pulse Rate:  [92-99] 96 (04/09 0224) Resp:  [17-30] 21 (04/09 0224) BP: (95-190)/(50-84) 95/75 (04/09 0224) SpO2:  [92 %-98 %] 98 % (04/09 0224) Weight:  [113.4 kg (250 lb)] 113.4 kg (250 lb) (04/08 2237) Last BM Date: 04/05/17  Intake/Output from previous day: 04/08 0701 - 04/09 0700 In: 2623.8 [I.V.:2273.8; IV Piggyback:350] Out: -  Intake/Output this shift: No intake/output data recorded.  PE: Gen:  Somnolent but arousable and answering questions appropriately.  CV: regular rate and rhythm, pedal pulses palpable  Pulm:  Normal respiratory effort  Abd: Soft, obese, TTP RUQ without guarding, negative murphy's sign, bowel sounds present in all 4 quadrants Ext: edema and chronic skin changes of bilateral lower extremities    Lab Results:   Recent Labs  04/05/17 2015 04/06/17 0558  WBC 19.0* 17.6*  HGB 12.5 13.8  HCT 37.9 41.8  PLT 315 252   BMET  Recent Labs  04/05/17 2015 04/06/17 0558  NA 135 136  K 4.2 3.3*  CL 102 102  CO2 26 26  GLUCOSE 154* 110*  BUN 16 15  CREATININE 0.85 0.76  CALCIUM 9.5 9.4   PT/INR No results for input(s): LABPROT, INR in the last 72 hours. CMP     Component Value Date/Time   NA 136 04/06/2017 0558   K 3.3 (L) 04/06/2017 0558   CL 102 04/06/2017 0558   CO2 26 04/06/2017 0558   GLUCOSE 110 (H) 04/06/2017 0558   BUN 15 04/06/2017 0558   CREATININE 0.76 04/06/2017 0558   CALCIUM 9.4 04/06/2017 0558   PROT 8.0 04/06/2017 0558   ALBUMIN 3.1 (L) 04/06/2017 0558   AST 24 04/06/2017 0558   ALT 16 04/06/2017 0558   ALKPHOS 76 04/06/2017 0558    BILITOT 1.3 (H) 04/06/2017 0558   GFRNONAA >60 04/06/2017 0558   GFRAA >60 04/06/2017 0558   Lipase     Component Value Date/Time   LIPASE 14 04/05/2017 2015       Studies/Results: Ct Abdomen Pelvis W Contrast  Result Date: 04/05/2017 CLINICAL DATA:  83 y/o  F; right upper quadrant pain with vomiting. EXAM: CT ABDOMEN AND PELVIS WITH CONTRAST TECHNIQUE: Multidetector CT imaging of the abdomen and pelvis was performed using the standard protocol following bolus administration of intravenous contrast. CONTRAST:  100 ISOVUE-300 IOPAMIDOL (ISOVUE-300) INJECTION 61% COMPARISON:  10/11/2012 CT abdomen and pelvis. FINDINGS: Lower chest: Negative. Hepatobiliary: Scattered punctate calcifications compatible with old granulomatous disease. No additional focal liver lesion identified. Wall thickening and surrounding inflammatory fat stranding of distended gallbladder. No intrahepatic biliary ductal dilatation. No radiopaque gallstone identified. Pancreas: Unremarkable. No pancreatic ductal dilatation or surrounding inflammatory changes. Spleen: Punctate calcifications compatible sequelae of prior granulomatous disease. Adrenals/Urinary Tract: Stable left kidney interpolar 11 mm cyst. No hydronephrosis. No appreciable urinary stone disease. Normal underdistended bladder. Normal adrenal glands. Stomach/Bowel: Small hiatal hernia. Moderate sigmoid diverticulosis without evidence for diverticulitis. No obstructive or inflammatory changes of the bowel. Normal appendix. Vascular/Lymphatic: Aortic atherosclerosis with moderate calcification. No enlarged abdominal or pelvic lymph nodes. Reproductive: Stable position of intrauterine device. No adnexal mass identified. Other: No abdominal wall hernia  or abnormality. No abdominopelvic ascites. Musculoskeletal: Left iliopsoas muscle atrophy. Diffuse sclerosis of the lumbar vertebral bodies and advanced lumbar degenerative changes with both discogenic disease and advanced  facet arthropathy. Multifactorial moderate to severe canal stenosis at the L3-4 through L5-S1 levels. No acute fracture identified. IMPRESSION: 1. Wall thickening and surrounding inflammatory changes of dilated gallbladder may represent acute cholecystitis in the appropriate clinical setting. 2. Small hiatal hernia. 3. Moderate sigmoid diverticulosis. 4. Moderate calcific aortic atherosclerosis. 5. Left iliopsoas atrophy either related to disuse or denervation. 6. Extensive sclerotic changes of the lumbar vertebral bodies, question sclerotic metastatic disease to the bones, bony infarcts, or advanced degenerative disease. No acute fracture identified. 7. Lumbar spondylosis with moderate to severe canal stenosis at the L3-4 through L5-S1 levels. Electronically Signed   By: Kristine Garbe M.D.   On: 04/05/2017 23:27   US Abdomen Limited  Result Date: 04/06/2017 CLINICAL DATA:  Cholecystitis EXAM: US ABDOMEN LIMITED - RIGHT UPPER QUADRANT COMPARISON:  04/05/2017 CT scan FINDINGS: Gallbladder: Distended gallbladder is noted. There is thickening of gallbladder wall up to 5.2 mm. No sonographic Murphy's sign. Sludge and gallstones are noted within gallbladder. The largest gallstone measures 1.4 cm. Common bile duct: Diameter: Limited visualization measures 3.6 cm in diameter. Suboptimal exam due to abundant bowel gas. Liver: No focal lesion identified. Within normal limits in parenchymal echogenicity. IMPRESSION: 1. Distended gallbladder with thickening of the wall up to 5.2 mm. Although there is no sonographic Murphy's sign acute cholecystitis cannot be excluded. Clinical correlation is necessary. Sludge and layering gallstone within gallbladder the largest measures 1.4 cm. Normal CBD. No focal hepatic mass. Electronically Signed   By: Lahoma Crocker M.D.   On: 04/06/2017 08:21   Dg Chest Port 1 View  Result Date: 04/05/2017 CLINICAL DATA:  Acute onset of vomiting and diarrhea. Left-sided chest pain. Initial  encounter. EXAM: PORTABLE CHEST 1 VIEW COMPARISON:  Chest radiograph performed 02/11/2016 FINDINGS: The lungs are hypoexpanded. Mild vascular crowding and vascular congestion are seen. Increased interstitial markings could reflect minimal interstitial edema. There is no evidence of pleural effusion or pneumothorax. The cardiomediastinal silhouette is within normal limits. No acute osseous abnormalities are seen. Chronic degenerative change is noted at the glenohumeral joints, with joint space loss and sclerosis. IMPRESSION: 1. Lungs hypoexpanded. Vascular congestion. Increased interstitial markings could reflect minimal interstitial edema. 2. Chronic degenerative change at the glenohumeral joints, with joint space loss and sclerosis. Electronically Signed   By: Garald Balding M.D.   On: 04/05/2017 21:34    Anti-infectives: Anti-infectives    Start     Dose/Rate Route Frequency Ordered Stop   04/06/17 2200  levofloxacin (LEVAQUIN) IVPB 750 mg     750 mg 100 mL/hr over 90 Minutes Intravenous Every 24 hours 04/05/17 2308     04/06/17 0600  metroNIDAZOLE (FLAGYL) IVPB 500 mg     500 mg 100 mL/hr over 60 Minutes Intravenous Every 8 hours 04/05/17 2308     04/05/17 2215  levofloxacin (LEVAQUIN) IVPB 750 mg     750 mg 100 mL/hr over 90 Minutes Intravenous  Once 04/05/17 2200 04/05/17 2358   04/05/17 2215  metroNIDAZOLE (FLAGYL) IVPB 500 mg     500 mg 100 mL/hr over 60 Minutes Intravenous  Once 04/05/17 2200 04/05/17 2359     Assessment/Plan Acute cholecystitis  - RUQ tenderness and RUQ U/S significant for thickened GB wall and stones, consistent with cholecystitis.  - Hx of plavix for CVA, last dose 4/8  - given patients  multiple medical problems I think percutaneous drainage of the gallbladder is recommended over laparoscopic cholecystectomy. Will confirm treatment plan with MD - should we proceed with laparoscopic cholecystectomy the patient would require cardiac clearance and surgery would be  delayed until the end of the week due to recent use of plavix.  Hyperbilirubinemia - trending down, 1.3 from 2.2; possibly passed a gallstone  Leukocytosis  Possible UTI - urine culture pending  Hypertension Diastolic heart dysfunction - EF 60-65% in 2013 G6PD Lymphedema  Bed bound  FEN: ok for diet to be advanced for now from a surgical standpoint  ID: Levaquin, Flagyl VTE: SCD's       LOS: 0 days    Jill Alexanders , [facility] 04/06/2017, 9:31 AM Pager: [phone] Consults: [phone] Mon-Fri 7:00 am-4:30 pm Sat-Sun 7:00 am-11:30 am

## 2017-04-06 NOTE — ED Notes (Signed)
Pt's contact----  POA: Genelle Gather--- tel# [phone]

## 2017-04-06 NOTE — Consult Note (Signed)
Reason for Consult:Abdominal pain Referring Physician: Dr. Roger Shelter is an 83 y.o. female.  HPI: 83 y/o F who arrives to the ED for eval of abdominal pain, n/v over the last 24hrs.  Difficult to obtain an accurate, reliable history from the patient.  She states she had generalized lower abdominal pain.  She states this was followed by some n/v. She denies any relation to food. She denies and diarrhea.  ED w/u c/o CT and Labs.  CT revealed a distended GB and some fat stranding.  Pt had an elev WBC and elev TB, all other LFTs were WNL.  Of note the patient does take Plavix for a h/o a CVA and states she last took this yesterday.  General surgery was consulted for further evaluation.  Past Medical History:  Diagnosis Date  . Edema   . Edema of both legs 01/2016  . Glucose-6-phosphate dehydrogenase deficiency (HCC)    possible diagnosis of G6PD per patient "phosphate 6 deficiency"  . Hypertension   . Osteoarthritis   . Pernicious anemia   . Stroke [facility]) 09/2000   couldn't move her left foot which resolved    Past Surgical History:  Procedure Laterality Date  . BONE MARROW BIOPSY  12/1966   for anemia  . MULTIPLE TOOTH EXTRACTIONS    . OTHER SURGICAL HISTORY     cyst from panty line removed as child    Family History  Problem Relation Age of Onset  . Diabetes Brother   . Diabetes Brother   . Heart failure Mother 77  . Heart attack Father 38    Social History:  reports that she quit smoking about 23 years ago. Her smoking use included Cigarettes. She has never used smokeless tobacco. She reports that she does not drink alcohol or use drugs.  Allergies:  Allergies  Allergen Reactions  . Asa [Aspirin] Other (See Comments)    Doctor told her not to take due to type anemia  . Phosphate Other (See Comments)    Anything containing phosphate 6. Unknown reaction  . Codeine Other (See Comments)    Made her too sleepy  . Penicillins Itching and Swelling     Medications: I have reviewed the patient's current medications.  Results for orders placed or performed during the hospital encounter of 04/05/17 (from the past 48 hour(s))  Lipase, blood     Status: None   Collection Time: 04/05/17  8:15 PM  Result Value Ref Range   Lipase 14 11 - 51 U/L  Comprehensive metabolic panel     Status: Abnormal   Collection Time: 04/05/17  8:15 PM  Result Value Ref Range   Sodium 135 135 - 145 mmol/L   Potassium 4.2 3.5 - 5.1 mmol/L   Chloride 102 101 - 111 mmol/L   CO2 26 22 - 32 mmol/L   Glucose, Bld 154 (H) 65 - 99 mg/dL   BUN 16 6 - 20 mg/dL   Creatinine, Ser 0.85 0.44 - 1.00 mg/dL   Calcium 9.5 8.9 - 10.3 mg/dL   Total Protein 8.5 (H) 6.5 - 8.1 g/dL   Albumin 3.4 (L) 3.5 - 5.0 g/dL   AST 36 15 - 41 U/L   ALT 14 14 - 54 U/L   Alkaline Phosphatase 79 38 - 126 U/L   Total Bilirubin 2.2 (H) 0.3 - 1.2 mg/dL   GFR calc non Af Amer >60 >60 mL/min   GFR calc Af Amer >60 >60 mL/min    Comment: (  NOTE) The eGFR has been calculated using the CKD EPI equation. This calculation has not been validated in all clinical situations. eGFR's persistently <60 mL/min signify possible Chronic Kidney Disease.    Anion gap 7 5 - 15  CBC     Status: Abnormal   Collection Time: 04/05/17  8:15 PM  Result Value Ref Range   WBC 19.0 (H) 4.0 - 10.5 K/uL   RBC 4.09 3.87 - 5.11 MIL/uL   Hemoglobin 12.5 12.0 - 15.0 g/dL   HCT 37.9 36.0 - 46.0 %   MCV 92.7 78.0 - 100.0 fL   MCH 30.6 26.0 - 34.0 pg   MCHC 33.0 30.0 - 36.0 g/dL   RDW 14.1 11.5 - 15.5 %   Platelets 315 150 - 400 K/uL  Differential     Status: Abnormal   Collection Time: 04/05/17  8:15 PM  Result Value Ref Range   Neutrophils Relative % 89 %   Neutro Abs 17.2 (H) 1.7 - 7.7 K/uL   Lymphocytes Relative 4 %   Lymphs Abs 0.7 0.7 - 4.0 K/uL   Monocytes Relative 7 %   Monocytes Absolute 1.3 (H) 0.1 - 1.0 K/uL   Eosinophils Relative 0 %   Eosinophils Absolute 0.0 0.0 - 0.7 K/uL   Basophils Relative 0  %   Basophils Absolute 0.0 0.0 - 0.1 K/uL  CBG monitoring, ED     Status: Abnormal   Collection Time: 04/05/17  8:35 PM  Result Value Ref Range   Glucose-Capillary 136 (H) 65 - 99 mg/dL  I-Stat CG4 Lactic Acid, ED     Status: Abnormal   Collection Time: 04/05/17  9:16 PM  Result Value Ref Range   Lactic Acid, Venous 2.80 (HH) 0.5 - 1.9 mmol/L   Comment NOTIFIED PHYSICIAN   Urinalysis, Routine w reflex microscopic     Status: Abnormal   Collection Time: 04/05/17  9:31 PM  Result Value Ref Range   Color, Urine AMBER (A) YELLOW    Comment: BIOCHEMICALS MAY BE AFFECTED BY COLOR   APPearance HAZY (A) CLEAR   Specific Gravity, Urine 1.018 1.005 - 1.030   pH 5.0 5.0 - 8.0   Glucose, UA NEGATIVE NEGATIVE mg/dL   Hgb urine dipstick MODERATE (A) NEGATIVE   Bilirubin Urine NEGATIVE NEGATIVE   Ketones, ur NEGATIVE NEGATIVE mg/dL   Protein, ur 100 (A) NEGATIVE mg/dL   Nitrite POSITIVE (A) NEGATIVE   Leukocytes, UA NEGATIVE NEGATIVE   RBC / HPF 0-5 0 - 5 RBC/hpf   WBC, UA 6-30 0 - 5 WBC/hpf   Bacteria, UA MANY (A) NONE SEEN   Squamous Epithelial / LPF 0-5 (A) NONE SEEN   Mucous PRESENT    Budding Yeast PRESENT   I-Stat CG4 Lactic Acid, ED  (not at  [facility])     Status: None   Collection Time: 04/06/17 12:32 AM  Result Value Ref Range   Lactic Acid, Venous 1.89 0.5 - 1.9 mmol/L    Ct Abdomen Pelvis W Contrast  Result Date: 04/05/2017 CLINICAL DATA:  83 y/o  F; right upper quadrant pain with vomiting. EXAM: CT ABDOMEN AND PELVIS WITH CONTRAST TECHNIQUE: Multidetector CT imaging of the abdomen and pelvis was performed using the standard protocol following bolus administration of intravenous contrast. CONTRAST:  100 ISOVUE-300 IOPAMIDOL (ISOVUE-300) INJECTION 61% COMPARISON:  10/11/2012 CT abdomen and pelvis. FINDINGS: Lower chest: Negative. Hepatobiliary: Scattered punctate calcifications compatible with old granulomatous disease. No additional focal liver lesion identified. Wall thickening and  surrounding inflammatory fat  stranding of distended gallbladder. No intrahepatic biliary ductal dilatation. No radiopaque gallstone identified. Pancreas: Unremarkable. No pancreatic ductal dilatation or surrounding inflammatory changes. Spleen: Punctate calcifications compatible sequelae of prior granulomatous disease. Adrenals/Urinary Tract: Stable left kidney interpolar 11 mm cyst. No hydronephrosis. No appreciable urinary stone disease. Normal underdistended bladder. Normal adrenal glands. Stomach/Bowel: Small hiatal hernia. Moderate sigmoid diverticulosis without evidence for diverticulitis. No obstructive or inflammatory changes of the bowel. Normal appendix. Vascular/Lymphatic: Aortic atherosclerosis with moderate calcification. No enlarged abdominal or pelvic lymph nodes. Reproductive: Stable position of intrauterine device. No adnexal mass identified. Other: No abdominal wall hernia or abnormality. No abdominopelvic ascites. Musculoskeletal: Left iliopsoas muscle atrophy. Diffuse sclerosis of the lumbar vertebral bodies and advanced lumbar degenerative changes with both discogenic disease and advanced facet arthropathy. Multifactorial moderate to severe canal stenosis at the L3-4 through L5-S1 levels. No acute fracture identified. IMPRESSION: 1. Wall thickening and surrounding inflammatory changes of dilated gallbladder may represent acute cholecystitis in the appropriate clinical setting. 2. Small hiatal hernia. 3. Moderate sigmoid diverticulosis. 4. Moderate calcific aortic atherosclerosis. 5. Left iliopsoas atrophy either related to disuse or denervation. 6. Extensive sclerotic changes of the lumbar vertebral bodies, question sclerotic metastatic disease to the bones, bony infarcts, or advanced degenerative disease. No acute fracture identified. 7. Lumbar spondylosis with moderate to severe canal stenosis at the L3-4 through L5-S1 levels. Electronically Signed   By: Kristine Garbe M.D.   On:  04/05/2017 23:27   Dg Chest Port 1 View  Result Date: 04/05/2017 CLINICAL DATA:  Acute onset of vomiting and diarrhea. Left-sided chest pain. Initial encounter. EXAM: PORTABLE CHEST 1 VIEW COMPARISON:  Chest radiograph performed 02/11/2016 FINDINGS: The lungs are hypoexpanded. Mild vascular crowding and vascular congestion are seen. Increased interstitial markings could reflect minimal interstitial edema. There is no evidence of pleural effusion or pneumothorax. The cardiomediastinal silhouette is within normal limits. No acute osseous abnormalities are seen. Chronic degenerative change is noted at the glenohumeral joints, with joint space loss and sclerosis. IMPRESSION: 1. Lungs hypoexpanded. Vascular congestion. Increased interstitial markings could reflect minimal interstitial edema. 2. Chronic degenerative change at the glenohumeral joints, with joint space loss and sclerosis. Electronically Signed   By: Garald Balding M.D.   On: 04/05/2017 21:34    Review of Systems  Constitutional: Negative for chills, fever, malaise/fatigue and weight loss.  HENT: Negative for ear discharge, ear pain, hearing loss, nosebleeds and tinnitus.   Eyes: Negative for blurred vision and double vision.  Respiratory: Negative for cough, hemoptysis, sputum production and shortness of breath.   Cardiovascular: Negative for chest pain, palpitations, orthopnea and claudication.  Gastrointestinal: Positive for abdominal pain, nausea and vomiting. Negative for constipation, diarrhea and heartburn.  Musculoskeletal: Negative for back pain, joint pain, myalgias and neck pain.  Neurological: Negative for dizziness, tingling, tremors and headaches.  All other systems reviewed and are negative.  Blood pressure (!) 188/59, pulse 99, temperature (S) 99.3 F (37.4 C), temperature source (S) Oral, resp. rate (!) 25, height 5' 3" (1.6 m), weight 113.4 kg (250 lb), SpO2 93 %. Physical Exam  Constitutional: She is oriented to person,  place, and time. She appears well-developed and well-nourished. No distress.  HENT:  Head: Normocephalic and atraumatic.  Right Ear: External ear normal.  Left Ear: External ear normal.  Nose: Nose normal.  Eyes: Conjunctivae and EOM are normal. Pupils are equal, round, and reactive to light. Right eye exhibits no discharge. Left eye exhibits no discharge. No scleral icterus.  Neck: Normal range of  motion. No tracheal deviation present. No thyromegaly present.  Cardiovascular: Normal rate, regular rhythm, normal heart sounds and intact distal pulses.   Respiratory: Effort normal and breath sounds normal. No stridor. No respiratory distress. She has no wheezes. She has no rales. She exhibits no tenderness.  GI: Soft. Bowel sounds are normal. She exhibits no distension and no mass. There is tenderness (lower abdomen > upper abdomen, min pain on deep palp RUQ). There is no rebound and no guarding.  Musculoskeletal: Normal range of motion. She exhibits no edema, tenderness or deformity.  Lymphadenopathy:    She has no cervical adenopathy.  Neurological: She is alert and oriented to person, place, and time.  Skin: Skin is warm. No rash noted. She is not diaphoretic. No erythema. No pallor.  Psychiatric: She has a normal mood and affect. Her behavior is normal. Judgment and thought content normal.    Assessment/Plan: 83 y/o F with Sepsis, UTI, ?Acute cholecystitis Past Medical History:  Diagnosis Date  . Edema   . Edema of both legs 01/2016  . Glucose-6-phosphate dehydrogenase deficiency (HCC)    possible diagnosis of G6PD per patient "phosphate 6 deficiency"  . Hypertension   . Osteoarthritis   . Pernicious anemia   . Stroke [facility]) 09/2000   couldn't move her left foot which resolved   Plans: 1. Based on the patient's presentation and CT scan, it is difficult to state the patient's sx are due to acute cholecystitis.  I would rec [place] to further eval GB.  Pt is on Plavix and would need to be  reversed vs. Waiting 5d prior to surgery.  Rec abx 2. Pt with a reported G6PD. Hemolysis could be a source of her elev TB. 3. Will follow along   Rosario Jacks., Anne Hahn 04/06/2017, 12:44 AM

## 2017-04-06 NOTE — H&P (Signed)
History and Physical    Connie Miranda KXF:[id number] DOB: [birth year] DOA: 04/05/2017  PCP: Philis Fendt, MD  Patient coming from: Home.  Chief Complaint: Abdominal pain.  HPI: Connie Miranda is a 83 y.o. female with history of hypertension, diastolic dysfunction and chronically bedbound with chronic lymphedema presents to the ER because of abdominal pain with nausea. Patient also has been having subjective feeling of fever chills. Patient has been having these symptoms for last 2 days. Denies any diarrhea. Pain is mostly in the epigastric and right upper quadrant.   ED Course: On exam patient has right upper quadrant tenderness and CT scan of the abdomen shows possible cholecystitis. Patient is febrile. Lactate is elevated. UA also shows features concerning for UTI. On-call general surgeon Dr. Rosendo Gros was consulted and Dr. Rosendo Gros has requested sonogram abdomen and surgery will be following in consult.  Review of Systems: As per HPI, rest all negative.   Past Medical History:  Diagnosis Date  . Edema   . Edema of both legs 01/2016  . Glucose-6-phosphate dehydrogenase deficiency (HCC)    possible diagnosis of G6PD per patient "phosphate 6 deficiency"  . Hypertension   . Osteoarthritis   . Pernicious anemia   . Stroke [facility]) 09/2000   couldn't move her left foot which resolved    Past Surgical History:  Procedure Laterality Date  . BONE MARROW BIOPSY  12/1966   for anemia  . MULTIPLE TOOTH EXTRACTIONS    . OTHER SURGICAL HISTORY     cyst from panty line removed as child     reports that she quit smoking about 23 years ago. Her smoking use included Cigarettes. She has never used smokeless tobacco. She reports that she does not drink alcohol or use drugs.  Allergies  Allergen Reactions  . Asa [Aspirin] Other (See Comments)    Doctor told her not to take due to type anemia  . Phosphate Other (See Comments)    Anything containing phosphate 6. Unknown reaction  . Codeine  Other (See Comments)    Made her too sleepy  . Penicillins Itching and Swelling    Family History  Problem Relation Age of Onset  . Diabetes Brother   . Diabetes Brother   . Heart failure Mother 64  . Heart attack Father 57    Prior to Admission medications   Medication Sig Start Date End Date Taking? Authorizing Provider  acetaminophen (TYLENOL) 325 MG tablet Take 2 tablets (650 mg total) by mouth every 6 (six) hours as needed. Patient taking differently: Take 650 mg by mouth every 6 (six) hours as needed for mild pain or moderate pain.  04/11/13  Yes Marianne L York, PA-C  amLODipine (NORVASC) 10 MG tablet Take 1 tablet (10 mg total) by mouth daily. 03/24/13  Yes Lauree Chandler, NP  Biotin 1000 MCG tablet Take 1 tablet (1 mg total) by mouth daily. 03/24/13  Yes Lauree Chandler, NP  Calcium Carbonate-Vitamin D (CALCIUM + D PO) Take 1 tablet by mouth daily.   Yes Historical Provider, MD  clopidogrel (PLAVIX) 75 MG tablet Take 1 tablet (75 mg total) by mouth daily. 03/24/13  Yes Lauree Chandler, NP  cyanocobalamin (,VITAMIN B-12,) 1000 MCG/ML injection Inject 1 mL (1,000 mcg total) into the muscle every 30 (thirty) days. 03/24/13  Yes Lauree Chandler, NP  furosemide (LASIX) 40 MG tablet Take 1 tablet (40 mg total) by mouth daily with breakfast. 04/11/13  Yes Melton Alar, PA-C  mineral oil-hydrophilic petrolatum (AQUAPHOR) ointment Apply 1 application topically daily. To lower extremities 03/24/13  Yes Lauree Chandler, NP  Multiple Vitamin (MULTIVITAMIN WITH MINERALS) TABS Take 1 tablet by mouth daily. 03/24/13  Yes Lauree Chandler, NP  potassium chloride SA (K-DUR,KLOR-CON) 20 MEQ tablet Take 1 tablet (20 mEq total) by mouth daily. 04/11/13  Yes Bobby Rumpf York, PA-C  zolpidem (AMBIEN) 5 MG tablet Take 1 tablet (5 mg total) by mouth at bedtime as needed for sleep (insomnia). 04/11/13  Yes Melton Alar, PA-C    Physical Exam: Vitals:   04/06/17 0030 04/06/17 0055 04/06/17 0100  04/06/17 0130  BP: (!) 161/61 (!) 161/61 98/75 130/66  Pulse:  92 92 92  Resp: 17 19 (!) 30 20  Temp:      TempSrc:      SpO2:  95% 94% 94%  Weight:      Height:          Constitutional: Obese not in distress. Vitals:   04/06/17 0030 04/06/17 0055 04/06/17 0100 04/06/17 0130  BP: (!) 161/61 (!) 161/61 98/75 130/66  Pulse:  92 92 92  Resp: 17 19 (!) 30 20  Temp:      TempSrc:      SpO2:  95% 94% 94%  Weight:      Height:       Eyes: Anicteric no pallor. ENMT: No discharge from the ears eyes nose and mouth. Neck: No JVD appreciated no mass felt. Respiratory: No rhonchi or crepitations. Cardiovascular: S1-S2 heard no murmurs appreciated. Abdomen: Right upper quadrant tenderness no guarding or rigidity. Musculoskeletal: Bilateral lower extremity chronic edema. Chronic skin changes. Skin: Chronic skin changes in the lower extremity. Neurologic: Alert awake oriented to time place and person. Moves all extremities. Psychiatric: Appears normal. Normal affect.   Labs on Admission: I have personally reviewed following labs and imaging studies  CBC:  Recent Labs Lab 04/05/17 2015  WBC 19.0*  NEUTROABS 17.2*  HGB 12.5  HCT 37.9  MCV 92.7  PLT 384   Basic Metabolic Panel:  Recent Labs Lab 04/05/17 2015  NA 135  K 4.2  CL 102  CO2 26  GLUCOSE 154*  BUN 16  CREATININE 0.85  CALCIUM 9.5   GFR: Estimated Creatinine Clearance: 62.9 mL/min (by C-G formula based on SCr of 0.85 mg/dL). Liver Function Tests:  Recent Labs Lab 04/05/17 2015  AST 36  ALT 14  ALKPHOS 79  BILITOT 2.2*  PROT 8.5*  ALBUMIN 3.4*    Recent Labs Lab 04/05/17 2015  LIPASE 14   No results for input(s): AMMONIA in the last 168 hours. Coagulation Profile: No results for input(s): INR, PROTIME in the last 168 hours. Cardiac Enzymes: No results for input(s): CKTOTAL, CKMB, CKMBINDEX, TROPONINI in the last 168 hours. BNP (last 3 results) No results for input(s): PROBNP in the last  8760 hours. HbA1C: No results for input(s): HGBA1C in the last 72 hours. CBG:  Recent Labs Lab 04/05/17 2035  GLUCAP 136*   Lipid Profile: No results for input(s): CHOL, HDL, LDLCALC, TRIG, CHOLHDL, LDLDIRECT in the last 72 hours. Thyroid Function Tests: No results for input(s): TSH, T4TOTAL, FREET4, T3FREE, THYROIDAB in the last 72 hours. Anemia Panel: No results for input(s): VITAMINB12, FOLATE, FERRITIN, TIBC, IRON, RETICCTPCT in the last 72 hours. Urine analysis:    Component Value Date/Time   COLORURINE AMBER (A) 04/05/2017 2131   APPEARANCEUR HAZY (A) 04/05/2017 2131   LABSPEC 1.018 04/05/2017 2131   PHURINE 5.0  04/05/2017 2131   GLUCOSEU NEGATIVE 04/05/2017 2131   HGBUR MODERATE (A) 04/05/2017 2131   BILIRUBINUR NEGATIVE 04/05/2017 2131   [place] 04/05/2017 2131   PROTEINUR 100 (A) 04/05/2017 2131   UROBILINOGEN 1.0 04/09/2013 0151   NITRITE POSITIVE (A) 04/05/2017 2131   LEUKOCYTESUR NEGATIVE 04/05/2017 2131   Sepsis Labs: @LABRCNTIP (procalcitonin:4,lacticidven:4) )No results found for this or any previous visit (from the past 240 hour(s)).   Radiological Exams on Admission: Ct Abdomen Pelvis W Contrast  Result Date: 04/05/2017 CLINICAL DATA:  83 y/o  F; right upper quadrant pain with vomiting. EXAM: CT ABDOMEN AND PELVIS WITH CONTRAST TECHNIQUE: Multidetector CT imaging of the abdomen and pelvis was performed using the standard protocol following bolus administration of intravenous contrast. CONTRAST:  100 ISOVUE-300 IOPAMIDOL (ISOVUE-300) INJECTION 61% COMPARISON:  10/11/2012 CT abdomen and pelvis. FINDINGS: Lower chest: Negative. Hepatobiliary: Scattered punctate calcifications compatible with old granulomatous disease. No additional focal liver lesion identified. Wall thickening and surrounding inflammatory fat stranding of distended gallbladder. No intrahepatic biliary ductal dilatation. No radiopaque gallstone identified. Pancreas: Unremarkable. No  pancreatic ductal dilatation or surrounding inflammatory changes. Spleen: Punctate calcifications compatible sequelae of prior granulomatous disease. Adrenals/Urinary Tract: Stable left kidney interpolar 11 mm cyst. No hydronephrosis. No appreciable urinary stone disease. Normal underdistended bladder. Normal adrenal glands. Stomach/Bowel: Small hiatal hernia. Moderate sigmoid diverticulosis without evidence for diverticulitis. No obstructive or inflammatory changes of the bowel. Normal appendix. Vascular/Lymphatic: Aortic atherosclerosis with moderate calcification. No enlarged abdominal or pelvic lymph nodes. Reproductive: Stable position of intrauterine device. No adnexal mass identified. Other: No abdominal wall hernia or abnormality. No abdominopelvic ascites. Musculoskeletal: Left iliopsoas muscle atrophy. Diffuse sclerosis of the lumbar vertebral bodies and advanced lumbar degenerative changes with both discogenic disease and advanced facet arthropathy. Multifactorial moderate to severe canal stenosis at the L3-4 through L5-S1 levels. No acute fracture identified. IMPRESSION: 1. Wall thickening and surrounding inflammatory changes of dilated gallbladder may represent acute cholecystitis in the appropriate clinical setting. 2. Small hiatal hernia. 3. Moderate sigmoid diverticulosis. 4. Moderate calcific aortic atherosclerosis. 5. Left iliopsoas atrophy either related to disuse or denervation. 6. Extensive sclerotic changes of the lumbar vertebral bodies, question sclerotic metastatic disease to the bones, bony infarcts, or advanced degenerative disease. No acute fracture identified. 7. Lumbar spondylosis with moderate to severe canal stenosis at the L3-4 through L5-S1 levels. Electronically Signed   By: Kristine Garbe M.D.   On: 04/05/2017 23:27   Dg Chest Port 1 View  Result Date: 04/05/2017 CLINICAL DATA:  Acute onset of vomiting and diarrhea. Left-sided chest pain. Initial encounter. EXAM:  PORTABLE CHEST 1 VIEW COMPARISON:  Chest radiograph performed 02/11/2016 FINDINGS: The lungs are hypoexpanded. Mild vascular crowding and vascular congestion are seen. Increased interstitial markings could reflect minimal interstitial edema. There is no evidence of pleural effusion or pneumothorax. The cardiomediastinal silhouette is within normal limits. No acute osseous abnormalities are seen. Chronic degenerative change is noted at the glenohumeral joints, with joint space loss and sclerosis. IMPRESSION: 1. Lungs hypoexpanded. Vascular congestion. Increased interstitial markings could reflect minimal interstitial edema. 2. Chronic degenerative change at the glenohumeral joints, with joint space loss and sclerosis. Electronically Signed   By: Garald Balding M.D.   On: 04/05/2017 21:34      Assessment/Plan Principal Problem:   Abdominal pain Active Problems:   Hypertension   Pernicious anemia   Glucose-6-phosphate dehydrogenase deficiency ([place])   Lymphedema   Morbid obesity (HCC)   Lower extremity weakness    1. Abdominal pain most  likely from acute cholecystitis - sonogram of the abdomen is pending. Patient is placed on empiric antibiotics. Follow cultures. Continue with gentle hydration. 2. Possible UTI - patient is on empiric antibiotics for urine cultures. 3. Hypertension - since patient is nothing by mouth I have placed patient on when necessary IV hydralazine. 4. History of diastolic dysfunction last EF measured in 2013 was 60-65% with grade 1 diastolic dysfunction - Lasix on hold and patient is receiving IV fluids. 5. Chronic lymphedema and chronically bedbound   DVT prophylaxis: Foot pump. Code Status: Full code.  Family Communication: Discussed with patient.  Disposition Plan: Home.  Consults called: General surgery.  Admission status: Inpatient.    Rise Patience MD [facility] Pager [phone].  If 7PM-7AM, please contact  night-coverage [email or web address] Password [email or web address]  04/06/2017, 2:15 AM

## 2017-04-07 ENCOUNTER — Inpatient Hospital Stay (HOSPITAL_COMMUNITY): Payer: Medicare Other

## 2017-04-07 DIAGNOSIS — E876 Hypokalemia: Secondary | ICD-10-CM

## 2017-04-07 DIAGNOSIS — I5032 Chronic diastolic (congestive) heart failure: Secondary | ICD-10-CM

## 2017-04-07 LAB — BLOOD CULTURE ID PANEL (REFLEXED)
ACINETOBACTER BAUMANNII: NOT DETECTED
CANDIDA GLABRATA: NOT DETECTED
CANDIDA KRUSEI: NOT DETECTED
CANDIDA PARAPSILOSIS: NOT DETECTED
Candida albicans: NOT DETECTED
Candida tropicalis: NOT DETECTED
ENTEROBACTER CLOACAE COMPLEX: NOT DETECTED
ENTEROCOCCUS SPECIES: NOT DETECTED
ESCHERICHIA COLI: NOT DETECTED
Enterobacteriaceae species: NOT DETECTED
Haemophilus influenzae: NOT DETECTED
KLEBSIELLA OXYTOCA: NOT DETECTED
Klebsiella pneumoniae: NOT DETECTED
LISTERIA MONOCYTOGENES: NOT DETECTED
Methicillin resistance: NOT DETECTED
Neisseria meningitidis: NOT DETECTED
PSEUDOMONAS AERUGINOSA: NOT DETECTED
Proteus species: NOT DETECTED
STREPTOCOCCUS AGALACTIAE: NOT DETECTED
STREPTOCOCCUS PNEUMONIAE: NOT DETECTED
STREPTOCOCCUS PYOGENES: NOT DETECTED
Serratia marcescens: NOT DETECTED
Staphylococcus aureus (BCID): NOT DETECTED
Staphylococcus species: DETECTED — AB
Streptococcus species: NOT DETECTED

## 2017-04-07 LAB — COMPREHENSIVE METABOLIC PANEL
ALK PHOS: 66 U/L (ref 38–126)
ALT: 15 U/L (ref 14–54)
ANION GAP: 9 (ref 5–15)
AST: 18 U/L (ref 15–41)
Albumin: 2.8 g/dL — ABNORMAL LOW (ref 3.5–5.0)
BILIRUBIN TOTAL: 1 mg/dL (ref 0.3–1.2)
BUN: 25 mg/dL — AB (ref 6–20)
CALCIUM: 9.3 mg/dL (ref 8.9–10.3)
CO2: 21 mmol/L — AB (ref 22–32)
Chloride: 110 mmol/L (ref 101–111)
Creatinine, Ser: 1.11 mg/dL — ABNORMAL HIGH (ref 0.44–1.00)
GFR calc Af Amer: 53 mL/min — ABNORMAL LOW (ref >60)
GFR calc non Af Amer: 45 mL/min — ABNORMAL LOW (ref >60)
GLUCOSE: 103 mg/dL — AB (ref 65–99)
POTASSIUM: 3.1 mmol/L — AB (ref 3.5–5.1)
SODIUM: 140 mmol/L (ref 135–145)
Total Protein: 7.1 g/dL (ref 6.5–8.1)

## 2017-04-07 LAB — GLUCOSE, CAPILLARY
GLUCOSE-CAPILLARY: 107 mg/dL — AB (ref 65–99)
GLUCOSE-CAPILLARY: 93 mg/dL (ref 65–99)
Glucose-Capillary: 124 mg/dL — ABNORMAL HIGH (ref 65–99)
Glucose-Capillary: 79 mg/dL (ref 65–99)

## 2017-04-07 LAB — CBC
HEMATOCRIT: 33.5 % — AB (ref 36.0–46.0)
Hemoglobin: 10.9 g/dL — ABNORMAL LOW (ref 12.0–15.0)
MCH: 30.5 pg (ref 26.0–34.0)
MCHC: 32.5 g/dL (ref 30.0–36.0)
MCV: 93.8 fL (ref 78.0–100.0)
Platelets: 250 10*3/uL (ref 150–400)
RBC: 3.57 MIL/uL — ABNORMAL LOW (ref 3.87–5.11)
RDW: 14.3 % (ref 11.5–15.5)
WBC: 17.2 10*3/uL — ABNORMAL HIGH (ref 4.0–10.5)

## 2017-04-07 LAB — MAGNESIUM: Magnesium: 1.7 mg/dL (ref 1.7–2.4)

## 2017-04-07 MED ORDER — SODIUM CHLORIDE 0.9 % IV SOLN: 30.0000 meq | INTRAVENOUS | Status: DC

## 2017-04-07 MED ORDER — MAGNESIUM SULFATE 2 GM/50ML IV SOLN
2.0000 g | Freq: Once | INTRAVENOUS | Status: AC
  Administered 2017-04-07: 2 g via INTRAVENOUS
  Filled 2017-04-07: qty 50

## 2017-04-07 MED ORDER — MORPHINE SULFATE (PF) 2 MG/ML IV SOLN
4.5000 mg | Freq: Once | INTRAVENOUS | Status: AC
  Administered 2017-04-07: 4.5 mg via INTRAVENOUS
  Filled 2017-04-07: qty 3

## 2017-04-07 MED ORDER — TECHNETIUM TC 99M MEBROFENIN IV KIT
5.4600 | PACK | Freq: Once | INTRAVENOUS | Status: AC | PRN
  Administered 2017-04-07: 5.46 via INTRAVENOUS

## 2017-04-07 MED ORDER — POTASSIUM CHLORIDE 10 MEQ/100ML IV SOLN
10.0000 meq | INTRAVENOUS | Status: AC
  Administered 2017-04-08 (×6): 10 meq via INTRAVENOUS
  Filled 2017-04-07 (×6): qty 100

## 2017-04-07 MED ORDER — MORPHINE SULFATE (PF) 2 MG/ML IV SOLN: 4.5000 mg | Freq: Once | INTRAVENOUS | Status: DC

## 2017-04-07 NOTE — Progress Notes (Signed)
PHARMACY - PHYSICIAN COMMUNICATION CRITICAL VALUE ALERT - BLOOD CULTURE IDENTIFICATION (BCID)  Results for orders placed or performed during the hospital encounter of 04/05/17  Blood Culture ID Panel (Reflexed) (Collected: 04/05/2017  8:15 PM)  Result Value Ref Range   Enterococcus species NOT DETECTED NOT DETECTED   Listeria monocytogenes NOT DETECTED NOT DETECTED   Staphylococcus species DETECTED (A) NOT DETECTED   Staphylococcus aureus NOT DETECTED NOT DETECTED   Methicillin resistance NOT DETECTED NOT DETECTED   Streptococcus species NOT DETECTED NOT DETECTED   Streptococcus agalactiae NOT DETECTED NOT DETECTED   Streptococcus pneumoniae NOT DETECTED NOT DETECTED   Streptococcus pyogenes NOT DETECTED NOT DETECTED   Acinetobacter baumannii NOT DETECTED NOT DETECTED   Enterobacteriaceae species NOT DETECTED NOT DETECTED   Enterobacter cloacae complex NOT DETECTED NOT DETECTED   Escherichia coli NOT DETECTED NOT DETECTED   Klebsiella oxytoca NOT DETECTED NOT DETECTED   Klebsiella pneumoniae NOT DETECTED NOT DETECTED   Proteus species NOT DETECTED NOT DETECTED   Serratia marcescens NOT DETECTED NOT DETECTED   Haemophilus influenzae NOT DETECTED NOT DETECTED   Neisseria meningitidis NOT DETECTED NOT DETECTED   Pseudomonas aeruginosa NOT DETECTED NOT DETECTED   Candida albicans NOT DETECTED NOT DETECTED   Candida glabrata NOT DETECTED NOT DETECTED   Candida krusei NOT DETECTED NOT DETECTED   Candida parapsilosis NOT DETECTED NOT DETECTED   Candida tropicalis NOT DETECTED NOT DETECTED    Name of physician (or Provider) Contacted: K. Schorr  Changes to prescribed antibiotics required: Since still covering for multiple possible sites of infection, recommend continue current antibiotics and f/u with final cultures.  Connie Miranda 04/07/2017  5:02 AM

## 2017-04-07 NOTE — Progress Notes (Addendum)
PROGRESS NOTE  Connie Miranda KVQ:[id number] DOB: 01-Dec-1933 DOA: 04/05/2017 PCP: [name], MD  Brief summary:   Connie Miranda is a 83 y.o. female with history of hypertension, diastolic dysfunction and chronically bedbound with chronic lymphedema presents to the ER because of abdominal pain with nausea.   HPI/Recap of past 24 hours:  She denies pain, fever resolved  Assessment/Plan: Principal Problem:   Abdominal pain Active Problems:   Hypertension   Pernicious anemia   Glucose-6-phosphate dehydrogenase deficiency (HCC)   Lymphedema   Morbid obesity (HCC)   Lower extremity weakness  Sepsis presented on admission with fever 101.2, leukocytosis wbc 19, lactic acidosis , lactic acid 2.8, ecoli UTI and possible cholecystitis  Blood culture 1/2 with coag negative staph, possible contamination  Urine + ecoli She is on abx with levaquin flagyl  Acute Cholecystitis?  [place] and CT findings of a thickened gallbladder wall and gallstones. [place] with negative  sonographic murphy's sign She did presented to the ED with ab pain and nausea, She currently denies ab pain, still some nausea, no vomiting hida scan pending, general surgery and IR following Diet per general surgery/IR.  Hypokalemia: replace k, check mag  Hypertension - she is npo, home meds norvasc held, on  IV hydralazine prn Avoid betablocker due to brief sinus pause on tele  diastolic dysfunction  last EF measured in 2013 was 60-65% with grade 1 diastolic dysfunction -  She received brief hydration in the ED,  Home med Lasix on hold, she is not on ivf currently, Continue resuming lasix on 4/11   H/o CVA :  plavix held (last dose on 4/8)  Morbid obesity: Body mass index is 44.29 kg/m.  Chronic lymphedema and chronically bedbound  Possible h/o of G6PD    Code Status: full  Family Communication: patient   Disposition Plan: pending   Consultants:  General surgery  IR  Procedures:  HIDA  scan on 4/10  Antibiotics:  levaquin/flagyl from admission   Objective: BP (!) 163/50 (BP Location: Right Wrist)   Pulse 96   Temp 99.6 F (37.6 C) (Oral)   Resp 18   Ht 5\' 3"  (1.6 m)   Wt 113.4 kg (250 lb)   SpO2 96%   BMI 44.29 kg/m   Intake/Output Summary (Last 24 hours) at 04/07/17 1837 Last data filed at 04/06/17 2342  Gross per 24 hour  Intake            902.5 ml  Output                0 ml  Net            902.5 ml   Filed Weights   04/05/17 2237  Weight: 113.4 kg (250 lb)    Exam:   General:  NAD, obese  Cardiovascular: RRR  Respiratory: diminished at basis, no rales, no rhonchi, no wheezing  Abdomen: Soft/ND/NT, positive BS  Musculoskeletal: chronic edema bilateral lower extremity  Neuro: aaox3  Data Reviewed: Basic Metabolic Panel:  Recent Labs Lab 04/05/17 2015 04/06/17 0558 04/07/17 0613  NA 135 136 140  K 4.2 3.3* 3.1*  CL 102 102 110  CO2 26 26 21*  GLUCOSE 154* 110* 103*  BUN 16 15 25*  CREATININE 0.85 0.76 1.11*  CALCIUM 9.5 9.4 9.3  MG  --   --  1.7   Liver Function Tests:  Recent Labs Lab 04/05/17 2015 04/06/17 0558 04/07/17 0613  AST 36 24 18  ALT 14  16 15  ALKPHOS 79 76 66  BILITOT 2.2* 1.3* 1.0  PROT 8.5* 8.0 7.1  ALBUMIN 3.4* 3.1* 2.8*    Recent Labs Lab 04/05/17 2015  LIPASE 14   No results for input(s): AMMONIA in the last 168 hours. CBC:  Recent Labs Lab 04/05/17 2015 04/06/17 0558 04/07/17 0613  WBC 19.0* 17.6* 17.2*  NEUTROABS 17.2* 15.6*  --   HGB 12.5 13.8 10.9*  HCT 37.9 41.8 33.5*  MCV 92.7 96.1 93.8  PLT 315 252 250   Cardiac Enzymes:   No results for input(s): CKTOTAL, CKMB, CKMBINDEX, TROPONINI in the last 168 hours. BNP (last 3 results) No results for input(s): BNP in the last 8760 hours.  ProBNP (last 3 results) No results for input(s): PROBNP in the last 8760 hours.  CBG:  Recent Labs Lab 04/05/17 2035 04/06/17 1632 04/07/17 0010 04/07/17 0727 04/07/17 1718  GLUCAP  136* 121* 124* 107* 93    Recent Results (from the past 240 hour(s))  Culture, blood (routine x 2)     Status: None (Preliminary result)   Collection Time: 04/05/17  8:10 PM  Result Value Ref Range Status   Specimen Description BLOOD LEFT HAND  Final   Special Requests IN PEDIATRIC BOTTLE Blood Culture adequate volume  Final   Culture   Final    NO GROWTH 1 DAY Performed at [facility], [address] [place], [place] [address]    Report Status PENDING  Incomplete  Culture, blood (routine x 2)     Status: None (Preliminary result)   Collection Time: 04/05/17  8:15 PM  Result Value Ref Range Status   Specimen Description BLOOD LEFT ANTECUBITAL  Final   Special Requests   Final    BOTTLES DRAWN AEROBIC AND ANAEROBIC Blood Culture adequate volume   Culture  Setup Time   Final    GRAM POSITIVE COCCI IN CLUSTERS IN BOTH AEROBIC AND ANAEROBIC BOTTLES Organism ID to follow CRITICAL RESULT CALLED TO, READ BACK BY AND VERIFIED WITH: Peggyann Juba, PHARMD @0422  04/07/17 MKELLY,MLT    Culture   Final    GRAM POSITIVE COCCI CULTURE REINCUBATED FOR BETTER GROWTH Performed at [facility], [address] [place], [place] [address]    Report Status PENDING  Incomplete  Blood Culture ID Panel (Reflexed)     Status: Abnormal   Collection Time: 04/05/17  8:15 PM  Result Value Ref Range Status   Enterococcus species NOT DETECTED NOT DETECTED Final   Listeria monocytogenes NOT DETECTED NOT DETECTED Final   Staphylococcus species DETECTED (A) NOT DETECTED Final    Comment: Methicillin (oxacillin) susceptible coagulase negative staphylococcus. Possible blood culture contaminant (unless isolated from more than one blood culture draw or clinical case suggests pathogenicity). No antibiotic treatment is indicated for blood  culture contaminants. CRITICAL RESULT CALLED TO, READ BACK BY AND VERIFIED WITH: J GRIMSLEY,PHARMD @0422  04/07/17 MKELLY,MLT    Staphylococcus aureus NOT DETECTED NOT  DETECTED Final   Methicillin resistance NOT DETECTED NOT DETECTED Final   Streptococcus species NOT DETECTED NOT DETECTED Final   Streptococcus agalactiae NOT DETECTED NOT DETECTED Final   Streptococcus pneumoniae NOT DETECTED NOT DETECTED Final   Streptococcus pyogenes NOT DETECTED NOT DETECTED Final   Acinetobacter baumannii NOT DETECTED NOT DETECTED Final   Enterobacteriaceae species NOT DETECTED NOT DETECTED Final   Enterobacter cloacae complex NOT DETECTED NOT DETECTED Final   Escherichia coli NOT DETECTED NOT DETECTED Final   Klebsiella oxytoca NOT DETECTED NOT DETECTED Final  Klebsiella pneumoniae NOT DETECTED NOT DETECTED Final   Proteus species NOT DETECTED NOT DETECTED Final   Serratia marcescens NOT DETECTED NOT DETECTED Final   Haemophilus influenzae NOT DETECTED NOT DETECTED Final   Neisseria meningitidis NOT DETECTED NOT DETECTED Final   Pseudomonas aeruginosa NOT DETECTED NOT DETECTED Final   Candida albicans NOT DETECTED NOT DETECTED Final   Candida glabrata NOT DETECTED NOT DETECTED Final   Candida krusei NOT DETECTED NOT DETECTED Final   Candida parapsilosis NOT DETECTED NOT DETECTED Final   Candida tropicalis NOT DETECTED NOT DETECTED Final    Comment: Performed at [facility], [address], [place], [place] [address]  Urine culture     Status: Abnormal (Preliminary result)   Collection Time: 04/05/17  9:31 PM  Result Value Ref Range Status   Specimen Description URINE, RANDOM  Final   Special Requests NONE  Final   Culture (A)  Final    >=100,000 COLONIES/mL ESCHERICHIA COLI SUSCEPTIBILITIES TO FOLLOW Performed at [facility], [address] [place], [place] [address]    Report Status PENDING  Incomplete     Studies: No results found.  Scheduled Meds: . levofloxacin (LEVAQUIN) IV  750 mg Intravenous Q24H  . metronidazole  500 mg Intravenous Q8H    Continuous Infusions:   Time spent:  Tajai Ihde MD, PhD  [facility] Pager [phone]. If 7PM-7AM, please contact night-coverage at [email or web address], password [facility] 04/07/2017, 6:37 PM  LOS: 1 day

## 2017-04-07 NOTE — Progress Notes (Signed)
IR aware of request for perc chole drain.  Patient [place] and CT findings of a thickened gallbladder wall and gallstones.  Her [place] states she has no sonographic murphy's sign and today the notes state she denies abdominal pain.  She is having some low grade fevers and an elevated WBC of 17k.  She has a urine cx that shows E. Coli which may be contributing to her WBC and fever as well.  Before proceeding with a drain placement, we would like a HIDA scan to confirm that the patient needs a drain.  This was relayed to the general surgery team.  Dhanvi Boesen E   2:27 PM 04/07/2017

## 2017-04-07 NOTE — Progress Notes (Signed)
CMT notified writer of patient having multiple pauses 1.25 seconds or less. Dr. Erlinda Hong notified. Will continue to monitor.

## 2017-04-07 NOTE — Progress Notes (Signed)
[facility] [place] [facility] Progress Note     Subjective: CC: fever  Patient currently denies abdominal pain. Occasional nausea present.   Objective: Vital signs in last 24 hours: Temp:  [99.5 F (37.5 C)-99.9 F (37.7 C)] 99.6 F (37.6 C) (04/10 0530) Pulse Rate:  [93-96] 96 (04/10 0530) Resp:  [18-20] 18 (04/10 0530) BP: (130-163)/(50-70) 163/50 (04/10 0530) SpO2:  [92 %-96 %] 96 % (04/10 0530) Last BM Date: 04/05/17  Intake/Output from previous day: 04/09 0701 - 04/10 0700 In: 1677.5 [I.V.:1327.5; IV Piggyback:350] Out: -  Intake/Output this shift: No intake/output data recorded.  PE: Gen:  Somnolent but arousable and answering questions appropriately.  CV: regular rate and rhythm, pedal pulses palpable  Pulm:  Normal respiratory effort  Abd: Soft, obese, TTP RUQ without guarding,  Ext: edema and chronic skin changes of bilateral lower extremities    Lab Results:   Recent Labs  04/06/17 0558 04/07/17 0613  WBC 17.6* 17.2*  HGB 13.8 10.9*  HCT 41.8 33.5*  PLT 252 250   BMET  Recent Labs  04/06/17 0558 04/07/17 0613  NA 136 140  K 3.3* 3.1*  CL 102 110  CO2 26 21*  GLUCOSE 110* 103*  BUN 15 25*  CREATININE 0.76 1.11*  CALCIUM 9.4 9.3   PT/INR No results for input(s): LABPROT, INR in the last 72 hours. CMP     Component Value Date/Time   NA 140 04/07/2017 0613   K 3.1 (L) 04/07/2017 0613   CL 110 04/07/2017 0613   CO2 21 (L) 04/07/2017 0613   GLUCOSE 103 (H) 04/07/2017 0613   BUN 25 (H) 04/07/2017 0613   CREATININE 1.11 (H) 04/07/2017 0613   CALCIUM 9.3 04/07/2017 0613   PROT 7.1 04/07/2017 0613   ALBUMIN 2.8 (L) 04/07/2017 0613   AST 18 04/07/2017 0613   ALT 15 04/07/2017 0613   ALKPHOS 66 04/07/2017 0613   BILITOT 1.0 04/07/2017 0613   GFRNONAA 45 (L) 04/07/2017 0613   GFRAA 53 (L) 04/07/2017 0613   Lipase     Component Value Date/Time   LIPASE 14 04/05/2017 2015       Studies/Results: Ct Abdomen Pelvis W Contrast  Result  Date: 04/05/2017 CLINICAL DATA:  83 y/o  F; right upper quadrant pain with vomiting. EXAM: CT ABDOMEN AND PELVIS WITH CONTRAST TECHNIQUE: Multidetector CT imaging of the abdomen and pelvis was performed using the standard protocol following bolus administration of intravenous contrast. CONTRAST:  100 ISOVUE-300 IOPAMIDOL (ISOVUE-300) INJECTION 61% COMPARISON:  10/11/2012 CT abdomen and pelvis. FINDINGS: Lower chest: Negative. Hepatobiliary: Scattered punctate calcifications compatible with old granulomatous disease. No additional focal liver lesion identified. Wall thickening and surrounding inflammatory fat stranding of distended gallbladder. No intrahepatic biliary ductal dilatation. No radiopaque gallstone identified. Pancreas: Unremarkable. No pancreatic ductal dilatation or surrounding inflammatory changes. Spleen: Punctate calcifications compatible sequelae of prior granulomatous disease. Adrenals/Urinary Tract: Stable left kidney interpolar 11 mm cyst. No hydronephrosis. No appreciable urinary stone disease. Normal underdistended bladder. Normal adrenal glands. Stomach/Bowel: Small hiatal hernia. Moderate sigmoid diverticulosis without evidence for diverticulitis. No obstructive or inflammatory changes of the bowel. Normal appendix. Vascular/Lymphatic: Aortic atherosclerosis with moderate calcification. No enlarged abdominal or pelvic lymph nodes. Reproductive: Stable position of intrauterine device. No adnexal mass identified. Other: No abdominal wall hernia or abnormality. No abdominopelvic ascites. Musculoskeletal: Left iliopsoas muscle atrophy. Diffuse sclerosis of the lumbar vertebral bodies and advanced lumbar degenerative changes with both discogenic disease and advanced facet arthropathy. Multifactorial moderate to severe canal stenosis at the  L3-4 through L5-S1 levels. No acute fracture identified. IMPRESSION: 1. Wall thickening and surrounding inflammatory changes of dilated gallbladder may represent  acute cholecystitis in the appropriate clinical setting. 2. Small hiatal hernia. 3. Moderate sigmoid diverticulosis. 4. Moderate calcific aortic atherosclerosis. 5. Left iliopsoas atrophy either related to disuse or denervation. 6. Extensive sclerotic changes of the lumbar vertebral bodies, question sclerotic metastatic disease to the bones, bony infarcts, or advanced degenerative disease. No acute fracture identified. 7. Lumbar spondylosis with moderate to severe canal stenosis at the L3-4 through L5-S1 levels. Electronically Signed   By: Kristine Garbe M.D.   On: 04/05/2017 23:27   US Abdomen Limited  Result Date: 04/06/2017 CLINICAL DATA:  Cholecystitis EXAM: US ABDOMEN LIMITED - RIGHT UPPER QUADRANT COMPARISON:  04/05/2017 CT scan FINDINGS: Gallbladder: Distended gallbladder is noted. There is thickening of gallbladder wall up to 5.2 mm. No sonographic Murphy's sign. Sludge and gallstones are noted within gallbladder. The largest gallstone measures 1.4 cm. Common bile duct: Diameter: Limited visualization measures 3.6 cm in diameter. Suboptimal exam due to abundant bowel gas. Liver: No focal lesion identified. Within normal limits in parenchymal echogenicity. IMPRESSION: 1. Distended gallbladder with thickening of the wall up to 5.2 mm. Although there is no sonographic Murphy's sign acute cholecystitis cannot be excluded. Clinical correlation is necessary. Sludge and layering gallstone within gallbladder the largest measures 1.4 cm. Normal CBD. No focal hepatic mass. Electronically Signed   By: Lahoma Crocker M.D.   On: 04/06/2017 08:21   Dg Chest Port 1 View  Result Date: 04/05/2017 CLINICAL DATA:  Acute onset of vomiting and diarrhea. Left-sided chest pain. Initial encounter. EXAM: PORTABLE CHEST 1 VIEW COMPARISON:  Chest radiograph performed 02/11/2016 FINDINGS: The lungs are hypoexpanded. Mild vascular crowding and vascular congestion are seen. Increased interstitial markings could reflect  minimal interstitial edema. There is no evidence of pleural effusion or pneumothorax. The cardiomediastinal silhouette is within normal limits. No acute osseous abnormalities are seen. Chronic degenerative change is noted at the glenohumeral joints, with joint space loss and sclerosis. IMPRESSION: 1. Lungs hypoexpanded. Vascular congestion. Increased interstitial markings could reflect minimal interstitial edema. 2. Chronic degenerative change at the glenohumeral joints, with joint space loss and sclerosis. Electronically Signed   By: Garald Balding M.D.   On: 04/05/2017 21:34    Anti-infectives: Anti-infectives    Start     Dose/Rate Route Frequency Ordered Stop   04/06/17 2200  levofloxacin (LEVAQUIN) IVPB 750 mg     750 mg 100 mL/hr over 90 Minutes Intravenous Every 24 hours 04/05/17 2308     04/06/17 0600  metroNIDAZOLE (FLAGYL) IVPB 500 mg     500 mg 100 mL/hr over 60 Minutes Intravenous Every 8 hours 04/05/17 2308     04/05/17 2215  levofloxacin (LEVAQUIN) IVPB 750 mg     750 mg 100 mL/hr over 90 Minutes Intravenous  Once 04/05/17 2200 04/05/17 2358   04/05/17 2215  metroNIDAZOLE (FLAGYL) IVPB 500 mg     500 mg 100 mL/hr over 60 Minutes Intravenous  Once 04/05/17 2200 04/05/17 2359     Assessment/Plan Acute cholecystitis  - RUQ tenderness and RUQ U/S significant for thickened GB wall and stones, consistent with cholecystitis.  - Hx of plavix for CVA, last dose 4/8  - given patients multiple medical problems I think percutaneous drainage of the gallbladder is prudent  Hyperbilirubinemia - trending down Leukocytosis: will most likely improve after cholecystostomy  Possible UTI - urine culture pending  Hypertension Diastolic heart dysfunction - EF  60-65% in 2013 G6PD Lymphedema  Bed bound  FEN: clears as tolerated ID: Levaquin, Flagyl VTE: SCD's       LOS: 1 day    [facility] 04/07/2017, 9:54 AM Pager: [phone] Consults:  [phone] Mon-Fri 7:00 am-4:30 pm Sat-Sun 7:00 am-11:30 am

## 2017-04-08 ENCOUNTER — Encounter (HOSPITAL_COMMUNITY): Payer: Self-pay | Admitting: General Surgery

## 2017-04-08 ENCOUNTER — Inpatient Hospital Stay (HOSPITAL_COMMUNITY): Payer: Medicare Other

## 2017-04-08 DIAGNOSIS — D550 Anemia due to glucose-6-phosphate dehydrogenase [G6PD] deficiency: Secondary | ICD-10-CM

## 2017-04-08 DIAGNOSIS — B9620 Unspecified Escherichia coli [E. coli] as the cause of diseases classified elsewhere: Secondary | ICD-10-CM

## 2017-04-08 DIAGNOSIS — R1011 Right upper quadrant pain: Secondary | ICD-10-CM

## 2017-04-08 DIAGNOSIS — K810 Acute cholecystitis: Secondary | ICD-10-CM

## 2017-04-08 DIAGNOSIS — I10 Essential (primary) hypertension: Secondary | ICD-10-CM

## 2017-04-08 DIAGNOSIS — R29898 Other symptoms and signs involving the musculoskeletal system: Secondary | ICD-10-CM

## 2017-04-08 DIAGNOSIS — K819 Cholecystitis, unspecified: Secondary | ICD-10-CM

## 2017-04-08 DIAGNOSIS — I890 Lymphedema, not elsewhere classified: Secondary | ICD-10-CM

## 2017-04-08 DIAGNOSIS — N390 Urinary tract infection, site not specified: Secondary | ICD-10-CM

## 2017-04-08 DIAGNOSIS — A419 Sepsis, unspecified organism: Principal | ICD-10-CM

## 2017-04-08 DIAGNOSIS — A4151 Sepsis due to Escherichia coli [E. coli]: Secondary | ICD-10-CM

## 2017-04-08 HISTORY — PX: IR PERC CHOLECYSTOSTOMY: IMG2326

## 2017-04-08 LAB — COMPREHENSIVE METABOLIC PANEL
ALT: 15 U/L (ref 14–54)
AST: 24 U/L (ref 15–41)
Albumin: 2.7 g/dL — ABNORMAL LOW (ref 3.5–5.0)
Alkaline Phosphatase: 60 U/L (ref 38–126)
Anion gap: 8 (ref 5–15)
BUN: 20 mg/dL (ref 6–20)
CHLORIDE: 114 mmol/L — AB (ref 101–111)
CO2: 17 mmol/L — AB (ref 22–32)
CREATININE: 0.73 mg/dL (ref 0.44–1.00)
Calcium: 9.5 mg/dL (ref 8.9–10.3)
GFR calc non Af Amer: >60 mL/min (ref >60)
Glucose, Bld: 80 mg/dL (ref 65–99)
POTASSIUM: 4.9 mmol/L (ref 3.5–5.1)
SODIUM: 139 mmol/L (ref 135–145)
Total Bilirubin: 1.1 mg/dL (ref 0.3–1.2)
Total Protein: 7.5 g/dL (ref 6.5–8.1)

## 2017-04-08 LAB — CBC
HCT: 33.7 % — ABNORMAL LOW (ref 36.0–46.0)
Hemoglobin: 11.2 g/dL — ABNORMAL LOW (ref 12.0–15.0)
MCH: 31.7 pg (ref 26.0–34.0)
MCHC: 33.2 g/dL (ref 30.0–36.0)
MCV: 95.5 fL (ref 78.0–100.0)
PLATELETS: 272 10*3/uL (ref 150–400)
RBC: 3.53 MIL/uL — AB (ref 3.87–5.11)
RDW: 14.4 % (ref 11.5–15.5)
WBC: 17 10*3/uL — AB (ref 4.0–10.5)

## 2017-04-08 LAB — PROTIME-INR
INR: 1.37
Prothrombin Time: 17 seconds — ABNORMAL HIGH (ref 11.4–15.2)

## 2017-04-08 LAB — URINE CULTURE

## 2017-04-08 LAB — GLUCOSE, CAPILLARY
GLUCOSE-CAPILLARY: 79 mg/dL (ref 65–99)
GLUCOSE-CAPILLARY: 77 mg/dL (ref 65–99)
Glucose-Capillary: 78 mg/dL (ref 65–99)

## 2017-04-08 LAB — DIFFERENTIAL
BASOS ABS: 0 10*3/uL (ref 0.0–0.1)
BASOS PCT: 0 %
Eosinophils Absolute: 0.4 10*3/uL (ref 0.0–0.7)
Eosinophils Relative: 3 %
Lymphocytes Relative: 7 %
Lymphs Abs: 1.2 10*3/uL (ref 0.7–4.0)
MONO ABS: 1 10*3/uL (ref 0.1–1.0)
Monocytes Relative: 6 %
NEUTROS ABS: 14.3 10*3/uL — AB (ref 1.7–7.7)
Neutrophils Relative %: 84 %

## 2017-04-08 LAB — MAGNESIUM: Magnesium: 2.2 mg/dL (ref 1.7–2.4)

## 2017-04-08 MED ORDER — IOPAMIDOL (ISOVUE-300) INJECTION 61%
10.0000 mL | Freq: Once | INTRAVENOUS | Status: AC | PRN
  Administered 2017-04-08: 10 mL

## 2017-04-08 MED ORDER — FENTANYL CITRATE (PF) 100 MCG/2ML IJ SOLN
INTRAMUSCULAR | Status: AC | PRN
  Administered 2017-04-08 (×2): 50 ug via INTRAVENOUS

## 2017-04-08 MED ORDER — MIDAZOLAM HCL 2 MG/2ML IJ SOLN
INTRAMUSCULAR | Status: AC | PRN
  Administered 2017-04-08 (×4): 1 mg via INTRAVENOUS

## 2017-04-08 MED ORDER — FENTANYL CITRATE (PF) 100 MCG/2ML IJ SOLN
INTRAMUSCULAR | Status: AC
  Filled 2017-04-08: qty 4

## 2017-04-08 MED ORDER — LIDOCAINE HCL 1 % IJ SOLN
INTRAMUSCULAR | Status: DC | PRN
  Administered 2017-04-08: 10 mL

## 2017-04-08 MED ORDER — LIDOCAINE HCL 1 % IJ SOLN
INTRAMUSCULAR | Status: AC
  Filled 2017-04-08: qty 20

## 2017-04-08 MED ORDER — MIDAZOLAM HCL 2 MG/2ML IJ SOLN
INTRAMUSCULAR | Status: AC
  Filled 2017-04-08: qty 6

## 2017-04-08 MED ORDER — IOPAMIDOL (ISOVUE-300) INJECTION 61%
INTRAVENOUS | Status: AC
  Administered 2017-04-08: 10 mL
  Filled 2017-04-08: qty 50

## 2017-04-08 NOTE — Consult Note (Signed)
Chief Complaint: acute cholecystitis  Referring Physician:Dr. Leighton Ruff  Supervising Physician: Aletta Edouard  Patient Status: Connie Miranda - In-pt  HPI: Connie Miranda is an 83 y.o. female who was admitted secondary to abdominal pain and nausea.  Her caregiver noted she was not acting like herself and so she was brought to the ED.  She is noted to have an E. Coli UTI, but also found to have gallstones and thickening of her gallbladder wall.  Her abdominal pain is improving since admission and abx therapy, but she is still having some nausea and emesis.  She underwent a CT and an [place] that suggested possible cholecystitis, but given her pain was improved and she had a UTI, a HIDA scan was ordered to verify her gallbladder really needed a perc chole drain prior to proceeding with placement.  This was done overnight and did confirm nonvisualization of the gallbladder.  She does take plavix and today is day 3 off of it.  Her last dose was on 04-05-17.  Past Medical History:  Past Medical History:  Diagnosis Date  . Edema   . Edema of both legs 01/2016  . Glucose-6-phosphate dehydrogenase deficiency (HCC)    possible diagnosis of G6PD per patient "phosphate 6 deficiency"  . Hypertension   . Osteoarthritis   . Pernicious anemia   . Stroke [facility]) 09/2000   couldn't move her left foot which resolved    Past Surgical History:  Past Surgical History:  Procedure Laterality Date  . BONE MARROW BIOPSY  12/1966   for anemia  . MULTIPLE TOOTH EXTRACTIONS    . OTHER SURGICAL HISTORY     cyst from panty line removed as child    Family History:  Family History  Problem Relation Age of Onset  . Diabetes Brother   . Diabetes Brother   . Heart failure Mother 84  . Heart attack Father 29    Social History:  reports that she quit smoking about 23 years ago. Her smoking use included Cigarettes. She has never used smokeless tobacco. She reports that she does not drink alcohol or use  drugs.  Allergies:  Allergies  Allergen Reactions  . Asa [Aspirin] Other (See Comments)    Doctor told her not to take due to type anemia  . Phosphate Other (See Comments)    Anything containing phosphate 6. Unknown reaction  . Codeine Other (See Comments)    Made her too sleepy  . Penicillins Itching and Swelling    Medications: Medications reviewed in epic  Please HPI for pertinent positives, otherwise complete 10 system ROS negative.  Mallampati Score: MD Evaluation Airway: WNL Heart: WNL Abdomen: WNL Chest/ Lungs: WNL ASA  Classification: 3 Mallampati/Airway Score: Two  Physical Exam: BP (!) 150/78 (BP Location: Right Wrist)   Pulse 80   Temp 98.3 F (36.8 C) (Oral)   Resp 18   Ht 5' 3"  (1.6 m)   Wt 250 lb (113.4 kg)   SpO2 100%   BMI 44.29 kg/m  Body mass index is 44.29 kg/m. General: pleasant, elderly, obese black female who is laying in bed in NAD HEENT: head is normocephalic, atraumatic.  Sclera are noninjected.  PERRL.  Ears and nose without any masses or lesions.  Mouth is pink and moist Heart: regular, rate, and rhythm.  Normal s1,s2. No obvious murmurs, gallops, or rubs noted.  Palpable radial pulses bilaterally Lungs: CTAB, no wheezes, rhonchi, or rales noted.  Respiratory effort nonlabored Abd: soft, tender to deep palpation  in RUQ, ND, +BS, no masses, hernias, or organomegaly Psych: A&Ox3 with an appropriate affect.   Labs: Results for orders placed or performed during the hospital encounter of 04/05/17 (from the past 48 hour(s))  Glucose, capillary     Status: Abnormal   Collection Time: 04/06/17  4:32 PM  Result Value Ref Range   Glucose-Capillary 121 (H) 65 - 99 mg/dL  Glucose, capillary     Status: Abnormal   Collection Time: 04/07/17 12:10 AM  Result Value Ref Range   Glucose-Capillary 124 (H) 65 - 99 mg/dL  CBC     Status: Abnormal   Collection Time: 04/07/17  6:13 AM  Result Value Ref Range   WBC 17.2 (H) 4.0 - 10.5 K/uL   RBC 3.57  (L) 3.87 - 5.11 MIL/uL   Hemoglobin 10.9 (L) 12.0 - 15.0 g/dL    Comment: REPEATED TO VERIFY DELTA CHECK NOTED    HCT 33.5 (L) 36.0 - 46.0 %   MCV 93.8 78.0 - 100.0 fL   MCH 30.5 26.0 - 34.0 pg   MCHC 32.5 30.0 - 36.0 g/dL   RDW 14.3 11.5 - 15.5 %   Platelets 250 150 - 400 K/uL  Comprehensive metabolic panel     Status: Abnormal   Collection Time: 04/07/17  6:13 AM  Result Value Ref Range   Sodium 140 135 - 145 mmol/L   Potassium 3.1 (L) 3.5 - 5.1 mmol/L   Chloride 110 101 - 111 mmol/L   CO2 21 (L) 22 - 32 mmol/L   Glucose, Bld 103 (H) 65 - 99 mg/dL   BUN 25 (H) 6 - 20 mg/dL   Creatinine, Ser 1.11 (H) 0.44 - 1.00 mg/dL   Calcium 9.3 8.9 - 10.3 mg/dL   Total Protein 7.1 6.5 - 8.1 g/dL   Albumin 2.8 (L) 3.5 - 5.0 g/dL   AST 18 15 - 41 U/L   ALT 15 14 - 54 U/L   Alkaline Phosphatase 66 38 - 126 U/L   Total Bilirubin 1.0 0.3 - 1.2 mg/dL   GFR calc non Af Amer 45 (L) >60 mL/min   GFR calc Af Amer 53 (L) >60 mL/min    Comment: (NOTE) The eGFR has been calculated using the CKD EPI equation. This calculation has not been validated in all clinical situations. eGFR's persistently <60 mL/min signify possible Chronic Kidney Disease.    Anion gap 9 5 - 15  Magnesium     Status: None   Collection Time: 04/07/17  6:13 AM  Result Value Ref Range   Magnesium 1.7 1.7 - 2.4 mg/dL  Glucose, capillary     Status: Abnormal   Collection Time: 04/07/17  7:27 AM  Result Value Ref Range   Glucose-Capillary 107 (H) 65 - 99 mg/dL  Glucose, capillary     Status: None   Collection Time: 04/07/17  5:18 PM  Result Value Ref Range   Glucose-Capillary 93 65 - 99 mg/dL  Glucose, capillary     Status: None   Collection Time: 04/07/17 11:02 PM  Result Value Ref Range   Glucose-Capillary 79 65 - 99 mg/dL  Protime-INR     Status: Abnormal   Collection Time: 04/08/17  5:00 AM  Result Value Ref Range   Prothrombin Time 17.0 (H) 11.4 - 15.2 seconds   INR 1.37   Glucose, capillary     Status: None    Collection Time: 04/08/17  7:36 AM  Result Value Ref Range   Glucose-Capillary 79 65 - 99  mg/dL  CBC     Status: Abnormal   Collection Time: 04/08/17  8:31 AM  Result Value Ref Range   WBC 17.0 (H) 4.0 - 10.5 K/uL   RBC 3.53 (L) 3.87 - 5.11 MIL/uL   Hemoglobin 11.2 (L) 12.0 - 15.0 g/dL   HCT 33.7 (L) 36.0 - 46.0 %   MCV 95.5 78.0 - 100.0 fL   MCH 31.7 26.0 - 34.0 pg   MCHC 33.2 30.0 - 36.0 g/dL   RDW 14.4 11.5 - 15.5 %   Platelets 272 150 - 400 K/uL  Comprehensive metabolic panel     Status: Abnormal   Collection Time: 04/08/17  8:31 AM  Result Value Ref Range   Sodium 139 135 - 145 mmol/L   Potassium 4.9 3.5 - 5.1 mmol/L    Comment: DELTA CHECK NOTED NO VISIBLE HEMOLYSIS    Chloride 114 (H) 101 - 111 mmol/L   CO2 17 (L) 22 - 32 mmol/L   Glucose, Bld 80 65 - 99 mg/dL   BUN 20 6 - 20 mg/dL   Creatinine, Ser 0.73 0.44 - 1.00 mg/dL   Calcium 9.5 8.9 - 10.3 mg/dL   Total Protein 7.5 6.5 - 8.1 g/dL   Albumin 2.7 (L) 3.5 - 5.0 g/dL   AST 24 15 - 41 U/L   ALT 15 14 - 54 U/L   Alkaline Phosphatase 60 38 - 126 U/L   Total Bilirubin 1.1 0.3 - 1.2 mg/dL   GFR calc non Af Amer >60 >60 mL/min   GFR calc Af Amer >60 >60 mL/min    Comment: (NOTE) The eGFR has been calculated using the CKD EPI equation. This calculation has not been validated in all clinical situations. eGFR's persistently <60 mL/min signify possible Chronic Kidney Disease.    Anion gap 8 5 - 15  Magnesium     Status: None   Collection Time: 04/08/17  8:31 AM  Result Value Ref Range   Magnesium 2.2 1.7 - 2.4 mg/dL  Differential     Status: Abnormal   Collection Time: 04/08/17  8:31 AM  Result Value Ref Range   Neutrophils Relative % 84 %   Neutro Abs 14.3 (H) 1.7 - 7.7 K/uL   Lymphocytes Relative 7 %   Lymphs Abs 1.2 0.7 - 4.0 K/uL   Monocytes Relative 6 %   Monocytes Absolute 1.0 0.1 - 1.0 K/uL   Eosinophils Relative 3 %   Eosinophils Absolute 0.4 0.0 - 0.7 K/uL   Basophils Relative 0 %   Basophils  Absolute 0.0 0.0 - 0.1 K/uL    Imaging: Nm Hepatobiliary Liver Func  Result Date: 04/07/2017 CLINICAL DATA:  Acute cholecystitis EXAM: NUCLEAR MEDICINE HEPATOBILIARY IMAGING TECHNIQUE: Sequential images of the abdomen were obtained out to 60 minutes following intravenous administration of radiopharmaceutical. RADIOPHARMACEUTICALS:  5.45 mCi Tc-16mCholetec IV. This was subsequently followed by 4.5 mg of morphine IV and imaging performed for an additional 30 minutes after the gallbladder was not visualized at the 60 minute mark. COMPARISON:  04/06/2017 abdomen ultrasound, 04/05/2017 CT FINDINGS: The gallbladder was not visualized during the first 50 minutes post injection of Choletec. Patency of the common bile duct with small bowel activity is demonstrated with washout of the liver seen. No masslike abnormality of the liver is identified. The patient was then administered 4.5 mg of morphine and an additional 30 minutes of imaging was performed. The gallbladder was still not visualized. IMPRESSION: Nonvisualization of the gallbladder consistent with findings of acute cholecystitis.  Visualization of small bowel is in keeping a patent common bile duct. Electronically Signed   By: Ashley Royalty M.D.   On: 04/07/2017 22:28    Assessment/Plan 1. Acute cholecystitis We will plan to proceed with percutaneous cholecystostomy drain placement this afternoon.  The patient is NPO and her labs have been reviewed and are okay to proceed.  We discussed that this drain would stay in for at least 6-8 weeks.  The surgeon would then have to decide if she were a surgical candidate.  If so, then the drain would be removed at the time of surgery.  If not, she may possibly keep this drain life-long since she has cholelithiasis.  She stated she understood that. Risks and Benefits discussed with the patient including, but not limited to bleeding, infection, gallbladder perforation, bile leak, sepsis or even death. All of the  patient's questions were answered, patient is agreeable to proceed. Consent signed and in chart.   Thank you for this interesting consult.  I greatly enjoyed meeting KORTNEY POTVIN and look forward to participating in their care.  A copy of this report was sent to the requesting provider on this date.  Electronically Signed: Henreitta Cea 04/08/2017, 11:50 AM   I spent a total of 40 Minutes    in face to face in clinical consultation, greater than 50% of which was counseling/coordinating care for acute cholecystitis

## 2017-04-08 NOTE — NC FL2 (Signed)
[place] MEDICAID FL2 LEVEL OF CARE SCREENING TOOL     IDENTIFICATION  Patient Name: Connie Miranda Birthdate: 22-May-1933 Sex: female Admission Date (Current Location): 04/05/2017  [facility] and [place] Number:  Herbalist and Address:  [facility],  [place], [place]      Provider Number: [id number]  Attending Physician Name and Address:  Kerney Elbe, DO  Relative Name and Phone Number:       Current Level of Care: Hospital Recommended Level of Care: [place] Prior Approval Number:    Date Approved/Denied:   PASRR Number: [id number]  Discharge Plan: SNF    Current Diagnoses: Patient Active Problem List   Diagnosis Date Noted  . Lower extremity weakness 02/11/2016  . Difficulty walking 04/08/2013  . Weakness of both legs 04/08/2013  . Morbid obesity ([place]) 04/08/2013  . Acute renal failure ([place]) 01/28/2013  . Hyperkalemia 01/28/2013  . Fall 01/28/2013  . Nausea 01/28/2013  . Lymphedema 10/16/2012  . Cellulitis 10/16/2012  . Diarrhea 10/13/2012  . Cellulitis of left lower leg 10/12/2012  . Anemia 10/12/2012  . Abdominal pain 10/12/2012  . Hip pain 10/12/2012  . Hypertension   . Edema   . Pernicious anemia   . Glucose-6-phosphate dehydrogenase deficiency ([place])   . Osteoarthritis   . Stroke ([place]) 09/28/2000    Orientation RESPIRATION BLADDER Height & Weight     Self, Time, Place  Normal Incontinent Weight: 250 lb (113.4 kg) Height:  5\' 3"  (160 cm)  BEHAVIORAL SYMPTOMS/MOOD NEUROLOGICAL BOWEL NUTRITION STATUS        Diet (NPO time specified )  AMBULATORY STATUS COMMUNICATION OF NEEDS Skin   Total Care Verbally Other (Comment) (Skin tear; moisture associated skin damage; fissure (skin tear on left leg))                       Personal Care Assistance Level of Assistance  Bathing, Feeding, Dressing Bathing Assistance: Maximum assistance Feeding assistance: Limited assistance Dressing  Assistance: Maximum assistance     Functional Limitations Info             SPECIAL CARE FACTORS FREQUENCY  PT (By licensed PT), OT (By licensed OT)     PT Frequency: 5x/week OT Frequency: 5x/week            Contractures Contractures Info: Not present    Additional Factors Info  Code Status, Allergies Code Status Info: Full Allergies Info: Codeine;Penicillins;Asa Aspirin;Phosphate           Current Medications (04/08/2017):  This is the current hospital active medication list Current Facility-Administered Medications  Medication Dose Route Frequency Provider Last Rate Last Dose  . fentaNYL (SUBLIMAZE) 100 MCG/2ML injection           . iopamidol (ISOVUE-300) 61 % injection           . lidocaine (XYLOCAINE) 1 % (with pres) injection           . midazolam (VERSED) 2 MG/2ML injection           . acetaminophen (TYLENOL) tablet 650 mg  650 mg Oral Q6H PRN Rise Patience, MD       Or  . acetaminophen (TYLENOL) suppository 650 mg  650 mg Rectal Q6H PRN Rise Patience, MD      . hydrALAZINE (APRESOLINE) injection 10 mg  10 mg Intravenous Q4H PRN Rise Patience, MD      . levofloxacin [facility]) IVPB  750 mg  750 mg Intravenous Q24H [name], RPH   750 mg at 04/07/17 2313  . metroNIDAZOLE (FLAGYL) IVPB 500 mg  500 mg Intravenous Q8H [name], RPH   500 mg at 04/08/17 1420  . ondansetron (ZOFRAN) tablet 4 mg  4 mg Oral Q6H PRN Rise Patience, MD       Or  . ondansetron [facility]) injection 4 mg  4 mg Intravenous Q6H PRN Rise Patience, MD         Discharge Medications: Please see discharge summary for a list of discharge medications.  Relevant Imaging Results:  Relevant Lab Results:   Additional Information SSN [id number]  Burnis Medin, LCSW

## 2017-04-08 NOTE — Clinical Social Work Note (Signed)
Clinical Social Work Assessment  Patient Details  Name: Connie Miranda MRN: [id number] Date of Birth: 1933-05-10  Date of referral:  04/08/17               Reason for consult:  Facility Placement                Permission sought to share information with:  Facility Art therapist granted to share information::  Yes, Verbal Permission Granted  Name::        Agency::     Relationship::     Contact Information:     Housing/Transportation Living arrangements for the past 2 months:  Single Family Home Source of Information:  Patient Patient Interpreter Needed:  None Criminal Activity/Legal Involvement Pertinent to Current Situation/Hospitalization:  No - Comment as needed Significant Relationships:  Other(Comment), Siblings (Caregiver) Lives with:  Other (Comment) (Caregiver) Do you feel safe going back to the place where you live?  Yes Need for family participation in patient care:  No (Coment)  Care giving concerns:  Patient resides in home with caregiver, PT recommends SNF for ST rehab.   Social Worker assessment / plan:  CSW spoke with patient at bedside, patient alert and oriented x 4. CSW and patient discussed PT recommendation for SNF, patient agreeable and reports that she has been to 3 SNFs in the past. Patient reports that she prefers [facility] because of the close proximity to her home. CSW will complete SNF process and continue to assist patient with discharge planning.   Employment status:  Retired Forensic scientist:  [employer] PT Recommendations:  [place] / Referral to community resources:  [place]  Patient/Family's Response to care:  Patient agreeable to SNF, prefers [facility].   Patient/Family's Understanding of and Emotional Response to Diagnosis, Current Treatment, and Prognosis:  Patient reports that she came into hospital after her side started hurting. Patient noted that she  is still having tests done to see what's going on. Patient reports that her current treatment at the hospital has been okay and that she wants to return home with caregiver after ST [facility] at [facility].   Emotional Assessment Appearance:  Appears stated age Attitude/Demeanor/Rapport:  Other (Cooperative) Affect (typically observed):  Calm Orientation:  Oriented to Self, Oriented to Place, Oriented to  Time, Oriented to Situation Alcohol / Substance use:  Not Applicable Psych involvement (Current and /or in the community):  No (Comment)  Discharge Needs  Concerns to be addressed:  No discharge needs identified Readmission within the last 30 days:  No Current discharge risk:  None Barriers to Discharge:  No Barriers Identified   Burnis Medin, LCSW 04/08/2017, 3:22 PM

## 2017-04-08 NOTE — Progress Notes (Signed)
[facility] Progress Note     Subjective: CC: vomiting Pt reports vomiting up jello and banana pudding around 0500. Denies abdominal pain.   Objective: Vital signs in last 24 hours: Temp:  [98.3 F (36.8 C)] 98.3 F (36.8 C) (04/11 0451) Pulse Rate:  [80] 80 (04/11 0451) Resp:  [18] 18 (04/11 0451) BP: (150)/(78) 150/78 (04/11 0451) SpO2:  [100 %] 100 % (04/11 0451) Last BM Date: 04/05/17  Intake/Output from previous day: 04/10 0701 - 04/11 0700 In: 100 [IV Piggyback:100] Out: -  Intake/Output this shift: Total I/O In: 950 [IV Piggyback:950] Out: -   PE: Gen:  alert and answering questions appropriately. NAD CV: regular rate and rhythm, pedal pulses palpable  Pulm:  Normal respiratory effort  Abd: Soft, obese, TTP RUQ without guarding,  Ext: edema and chronic skin changes of bilateral lower extremities  Psych: A&Ox3  Lab Results:   Recent Labs  04/07/17 0613 04/08/17 0831  WBC 17.2* 17.0*  HGB 10.9* 11.2*  HCT 33.5* 33.7*  PLT 250 272   BMET  Recent Labs  04/07/17 0613 04/08/17 0831  NA 140 139  K 3.1* 4.9  CL 110 114*  CO2 21* 17*  GLUCOSE 103* 80  BUN 25* 20  CREATININE 1.11* 0.73  CALCIUM 9.3 9.5   PT/INR  Recent Labs  04/08/17 0500  LABPROT 17.0*  INR 1.37   CMP     Component Value Date/Time   NA 139 04/08/2017 0831   K 4.9 04/08/2017 0831   CL 114 (H) 04/08/2017 0831   CO2 17 (L) 04/08/2017 0831   GLUCOSE 80 04/08/2017 0831   BUN 20 04/08/2017 0831   CREATININE 0.73 04/08/2017 0831   CALCIUM 9.5 04/08/2017 0831   PROT 7.5 04/08/2017 0831   ALBUMIN 2.7 (L) 04/08/2017 0831   AST 24 04/08/2017 0831   ALT 15 04/08/2017 0831   ALKPHOS 60 04/08/2017 0831   BILITOT 1.1 04/08/2017 0831   GFRNONAA >60 04/08/2017 0831   GFRAA >60 04/08/2017 0831   Lipase     Component Value Date/Time   LIPASE 14 04/05/2017 2015       Studies/Results: Nm Hepatobiliary Liver Func  Result Date: 04/07/2017 CLINICAL DATA:  Acute  cholecystitis EXAM: NUCLEAR MEDICINE HEPATOBILIARY IMAGING TECHNIQUE: Sequential images of the abdomen were obtained out to 60 minutes following intravenous administration of radiopharmaceutical. RADIOPHARMACEUTICALS:  5.45 mCi Tc-37m Choletec IV. This was subsequently followed by 4.5 mg of morphine IV and imaging performed for an additional 30 minutes after the gallbladder was not visualized at the 60 minute mark. COMPARISON:  04/06/2017 abdomen ultrasound, 04/05/2017 CT FINDINGS: The gallbladder was not visualized during the first 50 minutes post injection of Choletec. Patency of the common bile duct with small bowel activity is demonstrated with washout of the liver seen. No masslike abnormality of the liver is identified. The patient was then administered 4.5 mg of morphine and an additional 30 minutes of imaging was performed. The gallbladder was still not visualized. IMPRESSION: Nonvisualization of the gallbladder consistent with findings of acute cholecystitis. Visualization of small bowel is in keeping a patent common bile duct. Electronically Signed   By: Ashley Royalty M.D.   On: 04/07/2017 22:28    Anti-infectives: Anti-infectives    Start     Dose/Rate Route Frequency Ordered Stop   04/06/17 2200  levofloxacin (LEVAQUIN) IVPB 750 mg     750 mg 100 mL/hr over 90 Minutes Intravenous Every 24 hours 04/05/17 2308     04/06/17  0600  metroNIDAZOLE (FLAGYL) IVPB 500 mg     500 mg 100 mL/hr over 60 Minutes Intravenous Every 8 hours 04/05/17 2308     04/05/17 2215  levofloxacin (LEVAQUIN) IVPB 750 mg     750 mg 100 mL/hr over 90 Minutes Intravenous  Once 04/05/17 2200 04/05/17 2358   04/05/17 2215  metroNIDAZOLE (FLAGYL) IVPB 500 mg     500 mg 100 mL/hr over 60 Minutes Intravenous  Once 04/05/17 2200 04/05/17 2359     Assessment/Plan Acute cholecystitis  - RUQ tenderness and RUQ U/S significant for thickened GB wall and stones, consistent with cholecystitis.  - Hx of plavix for CVA, last dose  4/8  - HIDA positive for cystic duct obstruction - Percutaneous cholecystostomy tube today by IR  Hyperbilirubinemia - resolved Leukocytosis: 17.0 will most likely improve after cholecystostomy  UTI -E.coli   Hypertension Diastolic heart dysfunction - EF 60-65% in 2013 G6PD Lymphedema  Bed bound  FEN: clears as tolerated ID: Levaquin, Flagyl VTE: SCD's   LOS: 2 days   Plan: Percutaneous cholecystostomy tube today by IR  Jill Alexanders , [facility] 04/08/2017, 10:41 AM Pager: [phone] Consults: [phone] Mon-Fri 7:00 am-4:30 pm Sat-Sun 7:00 am-11:30 am

## 2017-04-08 NOTE — Procedures (Signed)
Interventional Radiology Procedure Note  Procedure: Percutaneous cholecystostomy  Complications: None  Estimated Blood Loss: < 10 mL  Findings:  Purulent appearing bile aspirated from GB lumen. Sample sent for culture. 10 Fr drain placed in GB lumen and attached to gravity bag.  Venetia Night. Kathlene Cote, M.D Pager:  [phone]

## 2017-04-08 NOTE — Progress Notes (Signed)
PT Cancellation Note  Patient Details Name: Connie Miranda MRN: [id number] DOB: [birth year]   Cancelled Treatment:    Reason Eval/Treat Not Completed: Patient at procedure or test/unavailable. Will check back another day.    Weston Anna, MPT Pager: [phone]

## 2017-04-08 NOTE — Progress Notes (Signed)
Pharmacy Antibiotic Note  Connie Miranda is a 83 y.o. female c/o vomiting and diarrhea admitted on 04/05/2017 with intra-abdominal  Infection (likely acute cholecystitis), E.coli UTI.  Pharmacy has been consulted for flagyl/levaquin dosing.  Today, 04/08/2017 Day #4 Levaquin/Flagyl Tmax 99.6, currently afebrile WBC 17k SCr 0.73, CrCl 67 ml/min  Plan:  Continue Flagyl 500 mg IV q8h  Continue Levaquin 750 mg IV q24h  Consider narrowing antibiotic spectrum from Levaquin to Cipro as this will cover E.coli and still provide adequate abdominal gram negative coverage  Discussed with MD on 4/10 the CoNS that was isolated from 1 of 2 blood cultures on 4/8, felt to most likely be contaminant.  Height: 5\' 3"  (160 cm) Weight: 250 lb (113.4 kg) IBW/kg (Calculated) : 52.4  Temp (24hrs), Avg:98.3 F (36.8 C), Min:98.3 F (36.8 C), Max:98.3 F (36.8 C)   Recent Labs Lab 04/05/17 2015 04/05/17 2116 04/06/17 0032 04/06/17 0558 04/07/17 0613 04/08/17 0831  WBC 19.0*  --   --  17.6* 17.2* 17.0*  CREATININE 0.85  --   --  0.76 1.11* 0.73  LATICACIDVEN  --  2.80* 1.89  --   --   --     Estimated Creatinine Clearance: 66.9 mL/min (by C-G formula based on SCr of 0.73 mg/dL).    Allergies  Allergen Reactions  . Asa [Aspirin] Other (See Comments)    Doctor told her not to take due to type anemia  . Phosphate Other (See Comments)    Anything containing phosphate 6. Unknown reaction  . Codeine Other (See Comments)    Made her too sleepy  . Penicillins Itching and Swelling    Antimicrobials this admission:  4/8 Levaquin >> 4/8 Flagyl >>  Dose adjustments this admission:  ---  Microbiology results:  4/8 BCx: 1 of 2 coag negative staph 4/8 BCID: CoNS methicillin susceptible 4/8 UCx: >100k E.coli (R=ampicillin and unasyn only) Thank you for allowing pharmacy to be a part of this patient's care.  Peggyann Juba, PharmD, BCPS Pager: [phone] 04/08/2017 11:18 AM

## 2017-04-08 NOTE — Progress Notes (Signed)
PROGRESS NOTE    Connie Miranda  QIO:[id number] DOB: 03-07-1933 DOA: 04/05/2017 PCP: Connie Fendt, MD   Brief Narrative:  Connie Goyne Speightis a 83 y.o.femalewith history of hypertension, diastolic dysfunction and chronically bedbound with chronic lymphedema presents to the ER because of abdominal pain with nausea. Found to have E. Coli UTI and Acute Cholecystitis. Perc Cholecystostomy done 04/08/17.  Assessment & Plan:   Principal Problem:   Abdominal pain Active Problems:   Hypertension   Pernicious anemia   Glucose-6-phosphate dehydrogenase deficiency (HCC)   Lymphedema   Morbid obesity (HCC)   Lower extremity weakness  Sepsis from Cholecystitis and UTI, sp Perc Chole Drain -presented on admission with fever 101.2, leukocytosis wbc 19, lactic acidosis , lactic acid 2.8, ecoli UTI and Cholecystitis  -Blood culture 1/2 with coag negative staph, possible contamination  -Urine + ecoli -She is on abx with Levofloxacin 750 mg IV q24h and Flagyl 500 mg IV q8h -WBC went from 17.2 -> 17.0  Acute Cholecystitis s/p Perc Cholecystostomy -[place] and CT findings of a thickened gallbladder wall and gallstones. -[place] with negative sonographic murphy's sign She did presented to the ED with ab pain and nausea, She currently denies ab pain, still some nausea, and some vomiting -Hida scan showed nonvisualization of GB consistent with Acute Cholecystitis, -General surgery and IR following; IR placed Perc Drain -Diet per general surgery/IR.  Hypokalemia:  -Improved -K+ went from 3.1 -> 4.9 after repletion -Replete as Necessary  -Repeat CMP in AM  Hypertension -  -Was npo sp home meds norvasc held,  -on  IV hydralazine prn -Avoid betablocker due to brief sinus pause on tele  Diastolic dysfunction  -last EF measured in 2013 was 60-65% with grade 1 diastolic dysfunction -  -She received brief hydration in the ED,  -Home med Lasix on hold, she is not on ivf currently, -Continue resuming  lasix in AM  H/o CVA :  -Plavix held (last dose on 4/8)  Morbid obesity: -Body mass index is 44.29 kg/m.  Chronic lymphedema and chronically bedbound -Appreciate WOC nurse and PT/OT Evaluation  Possible h/o of G6PD -Continue to Monitor  DVT prophylaxis: SCD's Code Status: FULL CODE Family Communication: No family present at bedside Disposition Plan: Pending  Consultants:   [facility]  Interventional Radiology  Procedures: Percutaneous cholecystostomy 4/11/8  Antimicrobials: Anti-infectives    Start     Dose/Rate Route Frequency Ordered Stop   04/06/17 2200  levofloxacin (LEVAQUIN) IVPB 750 mg     750 mg 100 mL/hr over 90 Minutes Intravenous Every 24 hours 04/05/17 2308     04/06/17 0600  metroNIDAZOLE (FLAGYL) IVPB 500 mg     500 mg 100 mL/hr over 60 Minutes Intravenous Every 8 hours 04/05/17 2308     04/05/17 2215  levofloxacin (LEVAQUIN) IVPB 750 mg     750 mg 100 mL/hr over 90 Minutes Intravenous  Once 04/05/17 2200 04/05/17 2358   04/05/17 2215  metroNIDAZOLE (FLAGYL) IVPB 500 mg     500 mg 100 mL/hr over 60 Minutes Intravenous  Once 04/05/17 2200 04/05/17 2359     Subjective: Seen and examined and had some abdominal pain on Right side. No nausea. Had vomiting earlier. No CP or SOB. Asking when she will go down for perc tube.  Objective: Vitals:   04/06/17 1329 04/06/17 2031 04/07/17 0530 04/08/17 0451  BP: 130/66 (!) 141/70 (!) 163/50 (!) 150/78  Pulse: 93 94 96 80  Resp: 20 20 18 18   Temp: 99.9  F (37.7 C) 99.5 F (37.5 C) 99.6 F (37.6 C) 98.3 F (36.8 C)  TempSrc: Oral Oral Oral Oral  SpO2: 92% 92% 96% 100%  Weight:      Height:        Intake/Output Summary (Last 24 hours) at 04/08/17 0829 Last data filed at 04/08/17 0734  Gross per 24 hour  Intake             1050 ml  Output                0 ml  Net             1050 ml   Filed Weights   04/05/17 2237  Weight: 113.4 kg (250 lb)   Examination: Physical  Exam:  Constitutional: WN/WD obese AAF, NAD and appears calm and comfortable Eyes: Lids and conjunctivae normal, sclerae anicteric  ENMT: External Ears, Nose appear normal. Neck: Appears normal, supple, no cervical masses, normal ROM, no appreciable thyromegaly, no JVD Respiratory: Diminished to auscultation bilaterally, no wheezing, rales, rhonchi or crackles. Normal respiratory effort and patient is not tachypenic. No accessory muscle use.  Cardiovascular: RRR, no murmurs / rubs / gallops. S1 and S2 auscultated.  Abdomen: Soft, mildly tender on Right, non-distended. No masses palpated. No appreciable hepatosplenomegaly. Bowel sounds positive x4.  GU: Deferred. Musculoskeletal: No clubbing / cyanosis of digits/nails. Lymphedema with bilateral skin changes noted.  Skin: No rashes, lesions, ulcers. No induration; Warm and dry.  Neurologic: CN 2-12 grossly intact with no focal deficits. Romberg sign cerebellar reflexes not assessed.  Psychiatric: Normal judgment and insight. Alert and oriented x 3. Normal mood and appropriate affect.   Data Reviewed: I have personally reviewed following labs and imaging studies  CBC:  Recent Labs Lab 04/05/17 2015 04/06/17 0558 04/07/17 0613  WBC 19.0* 17.6* 17.2*  NEUTROABS 17.2* 15.6*  --   HGB 12.5 13.8 10.9*  HCT 37.9 41.8 33.5*  MCV 92.7 96.1 93.8  PLT 315 252 086   Basic Metabolic Panel:  Recent Labs Lab 04/05/17 2015 04/06/17 0558 04/07/17 0613  NA 135 136 140  K 4.2 3.3* 3.1*  CL 102 102 110  CO2 26 26 21*  GLUCOSE 154* 110* 103*  BUN 16 15 25*  CREATININE 0.85 0.76 1.11*  CALCIUM 9.5 9.4 9.3  MG  --   --  1.7   GFR: Estimated Creatinine Clearance: 48.2 mL/min (A) (by C-G formula based on SCr of 1.11 mg/dL (H)). Liver Function Tests:  Recent Labs Lab 04/05/17 2015 04/06/17 0558 04/07/17 0613  AST 36 24 18  ALT 14 16 15   ALKPHOS 79 76 66  BILITOT 2.2* 1.3* 1.0  PROT 8.5* 8.0 7.1  ALBUMIN 3.4* 3.1* 2.8*    Recent  Labs Lab 04/05/17 2015  LIPASE 14   No results for input(s): AMMONIA in the last 168 hours. Coagulation Profile:  Recent Labs Lab 04/08/17 0500  INR 1.37   Cardiac Enzymes: No results for input(s): CKTOTAL, CKMB, CKMBINDEX, TROPONINI in the last 168 hours. BNP (last 3 results) No results for input(s): PROBNP in the last 8760 hours. HbA1C: No results for input(s): HGBA1C in the last 72 hours. CBG:  Recent Labs Lab 04/07/17 0010 04/07/17 0727 04/07/17 1718 04/07/17 2302 04/08/17 0736  GLUCAP 124* 107* 93 79 79   Lipid Profile: No results for input(s): CHOL, HDL, LDLCALC, TRIG, CHOLHDL, LDLDIRECT in the last 72 hours. Thyroid Function Tests: No results for input(s): TSH, T4TOTAL, FREET4, T3FREE, THYROIDAB in the last  72 hours. Anemia Panel: No results for input(s): VITAMINB12, FOLATE, FERRITIN, TIBC, IRON, RETICCTPCT in the last 72 hours. Sepsis Labs:  Recent Labs Lab 04/05/17 2116 04/06/17 0032  LATICACIDVEN 2.80* 1.89    Recent Results (from the past 240 hour(s))  Culture, blood (routine x 2)     Status: None (Preliminary result)   Collection Time: 04/05/17  8:10 PM  Result Value Ref Range Status   Specimen Description BLOOD LEFT HAND  Final   Special Requests IN PEDIATRIC BOTTLE Blood Culture adequate volume  Final   Culture   Final    NO GROWTH 1 DAY Performed at [facility], [place] [address] [place], [place] [address]    Report Status PENDING  Incomplete  Culture, blood (routine x 2)     Status: None (Preliminary result)   Collection Time: 04/05/17  8:15 PM  Result Value Ref Range Status   Specimen Description BLOOD LEFT ANTECUBITAL  Final   Special Requests   Final    BOTTLES DRAWN AEROBIC AND ANAEROBIC Blood Culture adequate volume   Culture  Setup Time   Final    GRAM POSITIVE COCCI IN CLUSTERS IN BOTH AEROBIC AND ANAEROBIC BOTTLES Organism ID to follow CRITICAL RESULT CALLED TO, READ BACK BY AND VERIFIED WITH: Lavell Luster, PHARMD @0422   04/07/17 MKELLY,MLT    Culture   Final    GRAM POSITIVE COCCI CULTURE REINCUBATED FOR BETTER GROWTH Performed at [facility], [place] [address], [place], [place] [address]    Report Status PENDING  Incomplete  Blood Culture ID Panel (Reflexed)     Status: Abnormal   Collection Time: 04/05/17  8:15 PM  Result Value Ref Range Status   Enterococcus species NOT DETECTED NOT DETECTED Final   Listeria monocytogenes NOT DETECTED NOT DETECTED Final   Staphylococcus species DETECTED (A) NOT DETECTED Final    Comment: Methicillin (oxacillin) susceptible coagulase negative staphylococcus. Possible blood culture contaminant (unless isolated from more than one blood culture draw or clinical case suggests pathogenicity). No antibiotic treatment is indicated for blood  culture contaminants. CRITICAL RESULT CALLED TO, READ BACK BY AND VERIFIED WITH: J GRIMSLEY,PHARMD @0422  04/07/17 MKELLY,MLT    Staphylococcus aureus NOT DETECTED NOT DETECTED Final   Methicillin resistance NOT DETECTED NOT DETECTED Final   Streptococcus species NOT DETECTED NOT DETECTED Final   Streptococcus agalactiae NOT DETECTED NOT DETECTED Final   Streptococcus pneumoniae NOT DETECTED NOT DETECTED Final   Streptococcus pyogenes NOT DETECTED NOT DETECTED Final   Acinetobacter baumannii NOT DETECTED NOT DETECTED Final   Enterobacteriaceae species NOT DETECTED NOT DETECTED Final   Enterobacter cloacae complex NOT DETECTED NOT DETECTED Final   Escherichia coli NOT DETECTED NOT DETECTED Final   Klebsiella oxytoca NOT DETECTED NOT DETECTED Final   Klebsiella pneumoniae NOT DETECTED NOT DETECTED Final   Proteus species NOT DETECTED NOT DETECTED Final   Serratia marcescens NOT DETECTED NOT DETECTED Final   Haemophilus influenzae NOT DETECTED NOT DETECTED Final   Neisseria meningitidis NOT DETECTED NOT DETECTED Final   Pseudomonas aeruginosa NOT DETECTED NOT DETECTED Final   Candida albicans NOT DETECTED NOT DETECTED Final    Candida glabrata NOT DETECTED NOT DETECTED Final   Candida krusei NOT DETECTED NOT DETECTED Final   Candida parapsilosis NOT DETECTED NOT DETECTED Final   Candida tropicalis NOT DETECTED NOT DETECTED Final    Comment: Performed at [facility], [place]. [address], [place], [place] [address]  Urine culture     Status: Abnormal  Collection Time: 04/05/17  9:31 PM  Result Value Ref Range Status   Specimen Description URINE, RANDOM  Final   Special Requests NONE  Final   Culture >=100,000 COLONIES/mL ESCHERICHIA COLI (A)  Final   Report Status 04/08/2017 FINAL  Final   Organism ID, Bacteria ESCHERICHIA COLI (A)  Final      Susceptibility   Escherichia coli - MIC*    AMPICILLIN >=32 RESISTANT Resistant     CEFAZOLIN 16 SENSITIVE Sensitive     CEFTRIAXONE <=1 SENSITIVE Sensitive     CIPROFLOXACIN <=0.25 SENSITIVE Sensitive     GENTAMICIN <=1 SENSITIVE Sensitive     IMIPENEM <=0.25 SENSITIVE Sensitive     NITROFURANTOIN <=16 SENSITIVE Sensitive     TRIMETH/SULFA <=20 SENSITIVE Sensitive     AMPICILLIN/SULBACTAM >=32 RESISTANT Resistant     PIP/TAZO <=4 SENSITIVE Sensitive     Extended ESBL NEGATIVE Sensitive     * >=100,000 COLONIES/mL ESCHERICHIA COLI    Radiology Studies: Nm Hepatobiliary Liver Func  Result Date: 04/07/2017 CLINICAL DATA:  Acute cholecystitis EXAM: NUCLEAR MEDICINE HEPATOBILIARY IMAGING TECHNIQUE: Sequential images of the abdomen were obtained out to 60 minutes following intravenous administration of radiopharmaceutical. RADIOPHARMACEUTICALS:  5.45 mCi Tc-62m Choletec IV. This was subsequently followed by 4.5 mg of morphine IV and imaging performed for an additional 30 minutes after the gallbladder was not visualized at the 60 minute mark. COMPARISON:  04/06/2017 abdomen ultrasound, 04/05/2017 CT FINDINGS: The gallbladder was not visualized during the first 50 minutes post injection of Choletec. Patency of the common bile duct with small bowel activity is demonstrated  with washout of the liver seen. No masslike abnormality of the liver is identified. The patient was then administered 4.5 mg of morphine and an additional 30 minutes of imaging was performed. The gallbladder was still not visualized. IMPRESSION: Nonvisualization of the gallbladder consistent with findings of acute cholecystitis. Visualization of small bowel is in keeping a patent common bile duct. Electronically Signed   By: Ashley Royalty M.D.   On: 04/07/2017 22:28   Scheduled Meds: . levofloxacin (LEVAQUIN) IV  750 mg Intravenous Q24H  . metronidazole  500 mg Intravenous Q8H   Continuous Infusions:   LOS: 2 days   Kerney Elbe, DO [facility] Pager [phone]  If 7PM-7AM, please contact night-coverage [email or web address] Password [facility] 04/08/2017, 8:29 AM

## 2017-04-08 NOTE — Evaluation (Signed)
Physical Therapy Evaluation Patient Details Name: Connie Miranda MRN: [id number] DOB: 22-Apr-1933 Today's Date: 04/08/2017   History of Present Illness  83 yo female admitted with abdominal pain, cholecystitis, sepsis. hx of chronic lymphedema, HTN, CVA affecting L side. Hx of OA, chronic lymphedema  Clinical Impression  Bed level eval only. Mobility limited by pain. Pt is scheduled to go for a procedure later today. Discussed d/c plan-pt is open to SNF rehab. Will follow and progress activity as able. If pt returns home, recommend HHPT follow up if pt is agreeable.     Follow Up Recommendations SNF    Equipment Recommendations   (TBD at next venue)    Recommendations for Other Services       Precautions / Restrictions Precautions Precautions: Fall Restrictions Weight Bearing Restrictions: No      Mobility  Bed Mobility               General bed mobility comments: bed mobility limited by pain. Will need +2 to get to EOB  Transfers                    Ambulation/Gait                Stairs            Wheelchair Mobility    Modified Rankin (Stroke Patients Only)       Balance                                             Pertinent Vitals/Pain Pain Assessment: Faces Faces Pain Scale: Hurts whole lot Pain Location: joints. Generalized pain. Pain Descriptors / Indicators: Grimacing;Guarding;Sore;Aching Pain Intervention(s): Limited activity within patient's tolerance    Home Living Family/patient expects to be discharged to:: Private residence Living Arrangements:  (with caregiver) Available Help at Discharge: Personal care attendant Type of Home: House Home Access: Ramped entrance     Home Layout: Able to live on main level with bedroom/bathroom Home Equipment: Walker - 2 wheels;Wheelchair - manual;Bedside commode (hoyer lift)      Prior Function Level of Independence: Needs assistance   Gait / Transfers  Assistance Needed: primarily stays in bed. Requires extensive assist to get OOB  ADL's / Homemaking Assistance Needed: total assist for bathing, dressing        Hand Dominance        Extremity/Trunk Assessment   Upper Extremity Assessment Upper Extremity Assessment: RUE deficits/detail;LUE deficits/detail RUE Deficits / Details: Shoulder flex ~45 degrees until pain. Elbow flexion ~60 degrees until pain. Strength at least 3/5. RUE: Unable to fully assess due to pain LUE Deficits / Details: Strength at least 3/5. Shoulder flex ~60 degrees until pain.     Lower Extremity Assessment Lower Extremity Assessment: RLE deficits/detail;LLE deficits/detail RLE Deficits / Details: Hip Abd, Hip flex 2/5. Pt able to DF/PF ankle. Less pain compared to L LE.  LLE Deficits / Details: Severe pain with movement. Pt able to DF/PF ankle LLE: Unable to fully assess due to pain       Communication   Communication: No difficulties  Cognition Arousal/Alertness: Awake/alert Behavior During Therapy: WFL for tasks assessed/performed Overall Cognitive Status: Within Functional Limits for tasks assessed  General Comments      Exercises     Assessment/Plan    PT Assessment Patient needs continued PT services  PT Problem List Decreased strength;Decreased mobility;Decreased range of motion;Decreased activity tolerance;Decreased balance;Decreased knowledge of use of DME;Pain;Obesity       PT Treatment Interventions Therapeutic activities;Therapeutic exercise;Functional mobility training;Patient/family education;DME instruction    PT Goals (Current goals can be found in the Care Plan section)  Acute Rehab PT Goals Patient Stated Goal: to get better PT Goal Formulation: With patient Time For Goal Achievement: 04/22/17 Potential to Achieve Goals: Fair    Frequency Min 2X/week   Barriers to discharge        Co-evaluation                End of Session   Activity Tolerance: Patient limited by pain Patient left: in bed;with call bell/phone within reach;with bed alarm set   PT Visit Diagnosis: Muscle weakness (generalized) (M62.81);Difficulty in walking, not elsewhere classified (R26.2)    Time: 6433-2951 PT Time Calculation (min) (ACUTE ONLY): 10 min   Charges:   PT Evaluation $PT Eval Moderate Complexity: 1 Procedure     PT G Codes:          Weston Anna, MPT Pager: [phone]

## 2017-04-09 LAB — COMPREHENSIVE METABOLIC PANEL
ALBUMIN: 3 g/dL — AB (ref 3.5–5.0)
ALK PHOS: 55 U/L (ref 38–126)
ALT: 16 U/L (ref 14–54)
ANION GAP: 8 (ref 5–15)
AST: 21 U/L (ref 15–41)
BUN: 17 mg/dL (ref 6–20)
CALCIUM: 9.3 mg/dL (ref 8.9–10.3)
CO2: 22 mmol/L (ref 22–32)
Chloride: 107 mmol/L (ref 101–111)
Creatinine, Ser: 0.66 mg/dL (ref 0.44–1.00)
GFR calc Af Amer: >60 mL/min (ref >60)
GFR calc non Af Amer: >60 mL/min (ref >60)
GLUCOSE: 85 mg/dL (ref 65–99)
Potassium: 3.9 mmol/L (ref 3.5–5.1)
SODIUM: 137 mmol/L (ref 135–145)
Total Bilirubin: 1 mg/dL (ref 0.3–1.2)
Total Protein: 7.5 g/dL (ref 6.5–8.1)

## 2017-04-09 LAB — CBC WITH DIFFERENTIAL/PLATELET
BASOS PCT: 0 %
Basophils Absolute: 0 10*3/uL (ref 0.0–0.1)
Eosinophils Absolute: 0.4 10*3/uL (ref 0.0–0.7)
Eosinophils Relative: 4 %
HEMATOCRIT: 30.7 % — AB (ref 36.0–46.0)
Hemoglobin: 10 g/dL — ABNORMAL LOW (ref 12.0–15.0)
LYMPHS PCT: 11 %
Lymphs Abs: 1.1 10*3/uL (ref 0.7–4.0)
MCH: 30.8 pg (ref 26.0–34.0)
MCHC: 32.6 g/dL (ref 30.0–36.0)
MCV: 94.5 fL (ref 78.0–100.0)
MONOS PCT: 6 %
Monocytes Absolute: 0.6 10*3/uL (ref 0.1–1.0)
NEUTROS ABS: 7.9 10*3/uL — AB (ref 1.7–7.7)
Neutrophils Relative %: 79 %
Platelets: 296 10*3/uL (ref 150–400)
RBC: 3.25 MIL/uL — ABNORMAL LOW (ref 3.87–5.11)
RDW: 14.2 % (ref 11.5–15.5)
WBC: 10 10*3/uL (ref 4.0–10.5)

## 2017-04-09 LAB — MAGNESIUM: Magnesium: 2 mg/dL (ref 1.7–2.4)

## 2017-04-09 LAB — GLUCOSE, CAPILLARY
Glucose-Capillary: 80 mg/dL (ref 65–99)
Glucose-Capillary: 82 mg/dL (ref 65–99)

## 2017-04-09 LAB — PHOSPHORUS: Phosphorus: 2.7 mg/dL (ref 2.5–4.6)

## 2017-04-09 LAB — CULTURE, BLOOD (ROUTINE X 2): Special Requests: ADEQUATE

## 2017-04-09 MED ORDER — CEPHALEXIN 500 MG PO CAPS: 500.0000 mg | ORAL_CAPSULE | Freq: Four times a day (QID) | ORAL | 0 refills | Status: AC

## 2017-04-09 MED ORDER — CEPHALEXIN 500 MG PO CAPS
500.0000 mg | ORAL_CAPSULE | Freq: Four times a day (QID) | ORAL | Status: DC
  Administered 2017-04-09 – 2017-04-10 (×5): 500 mg via ORAL
  Filled 2017-04-09 (×5): qty 1

## 2017-04-09 MED ORDER — ENOXAPARIN SODIUM 60 MG/0.6ML ~~LOC~~ SOLN
50.0000 mg | SUBCUTANEOUS | Status: DC
  Administered 2017-04-09 – 2017-04-10 (×2): 50 mg via SUBCUTANEOUS
  Filled 2017-04-09 (×2): qty 0.6

## 2017-04-09 NOTE — Progress Notes (Signed)
PTAR transportation at bedside to transport patient to skill nursing facility (SNF) [facility] but patient refused to go. Patient stated she was unaware that she will be discharged today and that she does not want to go to [place]. ED social worker and provider on call were notified. Patient stated her family members are on the way to visit her and she will discus with them before discharging to skilled nursing facility. Will continue to monitor pt

## 2017-04-09 NOTE — Discharge Summary (Signed)
Physician Discharge Summary  Connie Miranda GEX:[id number] DOB: Apr 21, 1933 DOA: 04/05/2017  PCP: Philis Fendt, MD  Admit date: 04/05/2017 Discharge date: 04/09/2017  Admitted From: Home Disposition:  SNF  Recommendations for Outpatient Follow-up:  1. Follow up with PCP in 1-2 weeks 2. Have Drain remain in for at least 4-6 Weeks unless Cholecystectomy is done 3. Will need to follow up with IR [facility] near General Surgery Appointment 4. Follow up with General Surgery within 1 week 5. Please obtain CMP/CBC in one week 6. Please follow up on the following pending results: Bile Cultures  Home Health: No Equipment/Devices: None  Discharge Condition: Stable CODE STATUS: FULL CODE Diet recommendation: Heart Healthy / Carb Modified / Regular / Dysphagia   Brief/Interim Summary: Zarahi Fuerst Speightis a 83 y.o.femalewith history of hypertension, diastolic dysfunction and chronically bedbound with chronic lymphedema presented to the ER because of abdominal pain with nausea. Found to have E. Coli UTI and Acute Cholecystitis. Perc Cholecystostomy done 04/08/17 and patient improved. Discussed with ID Dr. Linus Salmons who recommending placing patient on po Keflex for total of 10 day Abx duration. Patient will need to follow up with [facility] and General Surgery within 4 weeks. IR Drain to remain in for at least 4-6 weeks unless patient has Cholecystectomy done. Patient deemed medically stable to be transferred to SNF at this time.   Discharge Diagnoses:  Principal Problem:   Abdominal pain Active Problems:   Hypertension   Pernicious anemia   Glucose-6-phosphate dehydrogenase deficiency (HCC)   Lymphedema   Morbid obesity (HCC)   Lower extremity weakness  Sepsis from Cholecystitis and UTI, sp Perc Chole Drain -presented on admission with fever 101.2, leukocytosis wbc 19, lactic acidosis , lactic acid 2.8, ecoli UTIand Cholecystitis  -Blood culture 1/2 with coag negative staph, possible  contamination  -Sepsis Physiology improved -Urine + ecoli -Was on abx with Levofloxacin 750 mg IV q24h and Flagyl 500 mg IV q8h and after discussion with Dr. Linus Salmons ID recommending narrowing to Keflex 500 mg po 4x daily; Patient has PCN listed as allergy but not a true allergy as does not cause anaphylaxis  -WBC went from 17.2 -> 17.0 -> 10.0  Acute Cholecystitis s/p Perc Cholecystostomy -[place] and CT findings of a thickened gallbladder wall and gallstones. -[place] with negative sonographic murphy's sign She did presented to the ED with ab pain and nausea, She currently denies ab pain, still some nausea, and some vomiting -Hida scan showed nonvisualization of GB consistent with Acute Cholecystitis, -General surgery and IR follwed; IR placed Perc Drain -Diet advanced -Follow up with General Surgery in 4 weeks and follow up with IR Drain clinic close to General Surgery appointment -Have Drain Remain in for at least 4-6 weeks  Hypokalemia: -Improved -K+ went from 3.1 -> 3.9 after repletion -Replete as Necessary  -Repeat CMP at SNF  Hypertension-  -Was npo sp home meds norvasc held, Resume Home Norvasc and Lasix -Was on IV hydralazine prn while hospitalized -Avoid betablocker due to brief sinus pause on tele  Diastolic dysfunction -last EF measured in 2013 was 60-65% with grade 1 diastolic dysfunction -  -She received brief hydration in the ED,  -Home med Lasix was on hold, she is not on ivf currently, -Continue Home Lasix  H/o CVA :  -Plavix held (last dose on 4/8) and will restart at D/C  Morbid obesity: -Body mass index is 44.29 kg/m.  Chronic lymphedema and chronically bedbound -Appreciate WOC nurse and PT Recommends SNF  Possible h/o of G6PD -Continue to Monitor  Discharge Instructions  Discharge Instructions    Call MD for:  difficulty breathing, headache or visual disturbances    Complete by:  As directed    Call MD for:  extreme fatigue    Complete by:  As  directed    Call MD for:  hives    Complete by:  As directed    Call MD for:  persistant dizziness or light-headedness    Complete by:  As directed    Call MD for:  persistant nausea and vomiting    Complete by:  As directed    Call MD for:  redness, tenderness, or signs of infection (pain, swelling, redness, odor or green/yellow discharge around incision site)    Complete by:  As directed    Call MD for:  severe uncontrolled pain    Complete by:  As directed    Call MD for:  temperature >100.4    Complete by:  As directed    Diet - low sodium heart healthy    Complete by:  As directed    Discharge instructions    Complete by:  As directed    Follow up Care at SNF. Follow up with General Surgery within 4 weeks and follow up with IR for Drain Management in 6-8 weeks. Take all medications as prescribed. If symptoms change or worsen please return to the ED for evaluation.   Increase activity slowly    Complete by:  As directed      Allergies as of 04/09/2017      Reactions   Asa [aspirin] Other (See Comments)   Doctor told her not to take due to type anemia   Phosphate Other (See Comments)   Anything containing phosphate 6. Unknown reaction   Codeine Other (See Comments)   Made her too sleepy   Penicillins Itching, Swelling      Medication List    TAKE these medications   acetaminophen 325 MG tablet Commonly known as:  TYLENOL Take 2 tablets (650 mg total) by mouth every 6 (six) hours as needed. What changed:  reasons to take this   amLODipine 10 MG tablet Commonly known as:  NORVASC Take 1 tablet (10 mg total) by mouth daily.   Biotin 1000 MCG tablet Take 1 tablet (1 mg total) by mouth daily.   CALCIUM + D PO Take 1 tablet by mouth daily.   cephALEXin 500 MG capsule Commonly known as:  KEFLEX Take 1 capsule (500 mg total) by mouth every 6 (six) hours. Start taking on:  04/10/2017   clopidogrel 75 MG tablet Commonly known as:  PLAVIX Take 1 tablet (75 mg total)  by mouth daily.   cyanocobalamin 1000 MCG/ML injection Commonly known as:  (VITAMIN B-12) Inject 1 mL (1,000 mcg total) into the muscle every 30 (thirty) days.   furosemide 40 MG tablet Commonly known as:  LASIX Take 1 tablet (40 mg total) by mouth daily with breakfast.   mineral oil-hydrophilic petrolatum ointment Apply 1 application topically daily. To lower extremities   multivitamin with minerals Tabs tablet Take 1 tablet by mouth daily.   potassium chloride SA 20 MEQ tablet Commonly known as:  K-DUR,KLOR-CON Take 1 tablet (20 mEq total) by mouth daily.   zolpidem 5 MG tablet Commonly known as:  AMBIEN Take 1 tablet (5 mg total) by mouth at bedtime as needed for sleep (insomnia).       Contact information for follow-up providers  Philis Fendt, MD. Call in 1 week(s).   Specialty:  Internal Medicine Why:  Call to schedule within 1 week Contact information: [place] [address] [phone]        Rosario Adie., MD Follow up in 4 week(s).   Specialty:  General Surgery Why:  Follow up in 4 weeks with General Surgery Contact information: [address] [place] [address] [phone]        Azzie Roup, MD Follow up.   Specialty:  Interventional Radiology Why:  Follow up in 6 to 8 weeks Contact information: [place] [address] [phone]            Contact information for after-discharge care    [facility] Follow up.   Specialty:  Skilled Nursing Facility Contact information: [address] [address] [phone]                 Allergies  Allergen Reactions  . Asa [Aspirin] Other (See Comments)    Doctor told her not to take due to type anemia  . Phosphate Other (See Comments)    Anything containing phosphate 6. Unknown reaction  . Codeine Other (See Comments)    Made her too sleepy  . Penicillins  Itching and Swelling   Consultations:  General Surgery  Interventional Radiology  Procedures/Studies: Nm Hepatobiliary Liver Func  Result Date: 04/07/2017 CLINICAL DATA:  Acute cholecystitis EXAM: NUCLEAR MEDICINE HEPATOBILIARY IMAGING TECHNIQUE: Sequential images of the abdomen were obtained out to 60 minutes following intravenous administration of radiopharmaceutical. RADIOPHARMACEUTICALS:  5.45 mCi Tc-71m Choletec IV. This was subsequently followed by 4.5 mg of morphine IV and imaging performed for an additional 30 minutes after the gallbladder was not visualized at the 60 minute mark. COMPARISON:  04/06/2017 abdomen ultrasound, 04/05/2017 CT FINDINGS: The gallbladder was not visualized during the first 50 minutes post injection of Choletec. Patency of the common bile duct with small bowel activity is demonstrated with washout of the liver seen. No masslike abnormality of the liver is identified. The patient was then administered 4.5 mg of morphine and an additional 30 minutes of imaging was performed. The gallbladder was still not visualized. IMPRESSION: Nonvisualization of the gallbladder consistent with findings of acute cholecystitis. Visualization of small bowel is in keeping a patent common bile duct. Electronically Signed   By: Ashley Royalty M.D.   On: 04/07/2017 22:28   Ct Abdomen Pelvis W Contrast  Result Date: 04/05/2017 CLINICAL DATA:  83 y/o  F; right upper quadrant pain with vomiting. EXAM: CT ABDOMEN AND PELVIS WITH CONTRAST TECHNIQUE: Multidetector CT imaging of the abdomen and pelvis was performed using the standard protocol following bolus administration of intravenous contrast. CONTRAST:  100 ISOVUE-300 IOPAMIDOL (ISOVUE-300) INJECTION 61% COMPARISON:  10/11/2012 CT abdomen and pelvis. FINDINGS: Lower chest: Negative. Hepatobiliary: Scattered punctate calcifications compatible with old granulomatous disease. No additional focal liver lesion identified. Wall thickening and surrounding  inflammatory fat stranding of distended gallbladder. No intrahepatic biliary ductal dilatation. No radiopaque gallstone identified. Pancreas: Unremarkable. No pancreatic ductal dilatation or surrounding inflammatory changes. Spleen: Punctate calcifications compatible sequelae of prior granulomatous disease. Adrenals/Urinary Tract: Stable left kidney interpolar 11 mm cyst. No hydronephrosis. No appreciable urinary stone disease. Normal underdistended bladder. Normal adrenal glands. Stomach/Bowel: Small hiatal hernia. Moderate sigmoid diverticulosis without evidence for diverticulitis. No obstructive or inflammatory changes of the bowel. Normal appendix. Vascular/Lymphatic: Aortic atherosclerosis with moderate calcification.  No enlarged abdominal or pelvic lymph nodes. Reproductive: Stable position of intrauterine device. No adnexal mass identified. Other: No abdominal wall hernia or abnormality. No abdominopelvic ascites. Musculoskeletal: Left iliopsoas muscle atrophy. Diffuse sclerosis of the lumbar vertebral bodies and advanced lumbar degenerative changes with both discogenic disease and advanced facet arthropathy. Multifactorial moderate to severe canal stenosis at the L3-4 through L5-S1 levels. No acute fracture identified. IMPRESSION: 1. Wall thickening and surrounding inflammatory changes of dilated gallbladder may represent acute cholecystitis in the appropriate clinical setting. 2. Small hiatal hernia. 3. Moderate sigmoid diverticulosis. 4. Moderate calcific aortic atherosclerosis. 5. Left iliopsoas atrophy either related to disuse or denervation. 6. Extensive sclerotic changes of the lumbar vertebral bodies, question sclerotic metastatic disease to the bones, bony infarcts, or advanced degenerative disease. No acute fracture identified. 7. Lumbar spondylosis with moderate to severe canal stenosis at the L3-4 through L5-S1 levels. Electronically Signed   By: Kristine Garbe M.D.   On: 04/05/2017  23:27   US Abdomen Limited  Result Date: 04/06/2017 CLINICAL DATA:  Cholecystitis EXAM: US ABDOMEN LIMITED - RIGHT UPPER QUADRANT COMPARISON:  04/05/2017 CT scan FINDINGS: Gallbladder: Distended gallbladder is noted. There is thickening of gallbladder wall up to 5.2 mm. No sonographic Murphy's sign. Sludge and gallstones are noted within gallbladder. The largest gallstone measures 1.4 cm. Common bile duct: Diameter: Limited visualization measures 3.6 cm in diameter. Suboptimal exam due to abundant bowel gas. Liver: No focal lesion identified. Within normal limits in parenchymal echogenicity. IMPRESSION: 1. Distended gallbladder with thickening of the wall up to 5.2 mm. Although there is no sonographic Murphy's sign acute cholecystitis cannot be excluded. Clinical correlation is necessary. Sludge and layering gallstone within gallbladder the largest measures 1.4 cm. Normal CBD. No focal hepatic mass. Electronically Signed   By: Lahoma Crocker M.D.   On: 04/06/2017 08:21   Ir Perc Cholecystostomy  Result Date: 04/08/2017 INDICATION: Acute cholecystitis and need for percutaneous cholecystostomy has the patient is currently not a candidate for operative cholecystectomy. EXAM: CHOLECYSTOSTOMY MEDICATIONS: None ANESTHESIA/SEDATION: Moderate (conscious) sedation was employed during this procedure. A total of Versed 4.0 mg and Fentanyl 100 mcg was administered intravenously. Moderate Sedation Time: 24 minutes. The patient's level of consciousness and vital signs were monitored continuously by radiology nursing throughout the procedure under my direct supervision. FLUOROSCOPY TIME:  Fluoroscopy Time: 1 minute and 6 seconds.(68 mGy). COMPLICATIONS: None immediate. PROCEDURE: Informed written consent was obtained from the patient after a thorough discussion of the procedural risks, benefits and alternatives. All questions were addressed. Maximal Sterile Barrier Technique was utilized including caps, mask, sterile gowns,  sterile gloves, sterile drape, hand hygiene and skin antiseptic. A timeout was performed prior to the initiation of the procedure. Ultrasound was utilized to localize the gallbladder. Under direct ultrasound guidance, a 21 gauge needle was advanced into the gallbladder lumen. Contrast injection was performed under fluoroscopy. A transitional dilator was placed. Over a guidewire, the percutaneous tract was dilated and a 10 French percutaneous drainage catheter placed. Additional bile sample was aspirated and sent for culture analysis. The catheter was injected with diluted contrast and a fluoroscopic spot image obtained. The catheter was attached to a gravity drainage bag. The catheter was secured at the skin with a Prolene retention suture and StatLock device. FINDINGS: Purulent bile was aspirated from the gallbladder lumen. The catheter was formed in the gallbladder and is draining well after placement. IMPRESSION: Placement of 10 French percutaneous cholecystostomy tube into the gallbladder lumen. There was return of purulent bile  and a sample was sent for culture analysis. The drainage catheter was connected to gravity bag drainage. Electronically Signed   By: Aletta Edouard M.D.   On: 04/08/2017 17:27   Dg Chest Port 1 View  Result Date: 04/05/2017 CLINICAL DATA:  Acute onset of vomiting and diarrhea. Left-sided chest pain. Initial encounter. EXAM: PORTABLE CHEST 1 VIEW COMPARISON:  Chest radiograph performed 02/11/2016 FINDINGS: The lungs are hypoexpanded. Mild vascular crowding and vascular congestion are seen. Increased interstitial markings could reflect minimal interstitial edema. There is no evidence of pleural effusion or pneumothorax. The cardiomediastinal silhouette is within normal limits. No acute osseous abnormalities are seen. Chronic degenerative change is noted at the glenohumeral joints, with joint space loss and sclerosis. IMPRESSION: 1. Lungs hypoexpanded. Vascular congestion. Increased  interstitial markings could reflect minimal interstitial edema. 2. Chronic degenerative change at the glenohumeral joints, with joint space loss and sclerosis. Electronically Signed   By: Garald Balding M.D.   On: 04/05/2017 21:34     Subjective: Seen and examined at bedside and had some discomfort when coughing. No Nausea or Vomiting and states she slept ok. No other concerns or complaints and wanting to do rehab.   Discharge Exam: Vitals:   04/09/17 0445 04/09/17 1541  BP: (!) 160/59 (!) 153/74  Pulse: 78 82  Resp: 18 18  Temp: 99.1 F (37.3 C) 99 F (37.2 C)   Vitals:   04/08/17 1753 04/08/17 2121 04/09/17 0445 04/09/17 1541  BP: 139/68 (!) 154/82 (!) 160/59 (!) 153/74  Pulse: 79 79 78 82  Resp: 18 18 18 18   Temp:  99.1 F (37.3 C) 99.1 F (37.3 C) 99 F (37.2 C)  TempSrc:  Oral Oral Oral  SpO2: 93% 93% 93% 94%  Weight:      Height:       General: Pt is alert, awake, not in acute distress Cardiovascular: RRR, S1/S2 +, no rubs, no gallops Respiratory: CTA bilaterally, no wheezing, no rhonchi Abdominal: Soft, NT, ND, bowel sounds +, Perc Chole Drain in place draining Extremities: Chronic lower extremity edema/lymphedema with scaling, no cyanosis  The results of significant diagnostics from this hospitalization (including imaging, microbiology, ancillary and laboratory) are listed below for reference.    Microbiology: Recent Results (from the past 240 hour(s))  Culture, blood (routine x 2)     Status: None (Preliminary result)   Collection Time: 04/05/17  8:10 PM  Result Value Ref Range Status   Specimen Description BLOOD LEFT HAND  Final   Special Requests IN PEDIATRIC BOTTLE Blood Culture adequate volume  Final   Culture   Final    NO GROWTH 3 DAYS Performed at [facility], [address] [place], [place] [address]    Report Status PENDING  Incomplete  Culture, blood (routine x 2)     Status: Abnormal   Collection Time: 04/05/17  8:15 PM  Result Value Ref  Range Status   Specimen Description BLOOD LEFT ANTECUBITAL  Final   Special Requests   Final    BOTTLES DRAWN AEROBIC AND ANAEROBIC Blood Culture adequate volume   Culture  Setup Time   Final    GRAM POSITIVE COCCI IN CLUSTERS IN BOTH AEROBIC AND ANAEROBIC BOTTLES Organism ID to follow CRITICAL RESULT CALLED TO, READ BACK BY AND VERIFIED WITH: Lavell Luster, PHARMD @0422  04/07/17 MKELLY,MLT    Culture (A)  Final    STAPHYLOCOCCUS SPECIES (COAGULASE NEGATIVE) THE SIGNIFICANCE OF ISOLATING THIS ORGANISM FROM A SINGLE SET OF BLOOD CULTURES WHEN  MULTIPLE SETS ARE DRAWN IS UNCERTAIN. PLEASE NOTIFY THE MICROBIOLOGY DEPARTMENT WITHIN ONE WEEK IF SPECIATION AND SENSITIVITIES ARE REQUIRED. Performed at [facility], [place] [address], [place], [place] [address]    Report Status 04/09/2017 FINAL  Final  Blood Culture ID Panel (Reflexed)     Status: Abnormal   Collection Time: 04/05/17  8:15 PM  Result Value Ref Range Status   Enterococcus species NOT DETECTED NOT DETECTED Final   Listeria monocytogenes NOT DETECTED NOT DETECTED Final   Staphylococcus species DETECTED (A) NOT DETECTED Final    Comment: Methicillin (oxacillin) susceptible coagulase negative staphylococcus. Possible blood culture contaminant (unless isolated from more than one blood culture draw or clinical case suggests pathogenicity). No antibiotic treatment is indicated for blood  culture contaminants. CRITICAL RESULT CALLED TO, READ BACK BY AND VERIFIED WITH: J GRIMSLEY,PHARMD @0422  04/07/17 MKELLY,MLT    Staphylococcus aureus NOT DETECTED NOT DETECTED Final   Methicillin resistance NOT DETECTED NOT DETECTED Final   Streptococcus species NOT DETECTED NOT DETECTED Final   Streptococcus agalactiae NOT DETECTED NOT DETECTED Final   Streptococcus pneumoniae NOT DETECTED NOT DETECTED Final   Streptococcus pyogenes NOT DETECTED NOT DETECTED Final   Acinetobacter baumannii NOT DETECTED NOT DETECTED Final   Enterobacteriaceae  species NOT DETECTED NOT DETECTED Final   Enterobacter cloacae complex NOT DETECTED NOT DETECTED Final   Escherichia coli NOT DETECTED NOT DETECTED Final   Klebsiella oxytoca NOT DETECTED NOT DETECTED Final   Klebsiella pneumoniae NOT DETECTED NOT DETECTED Final   Proteus species NOT DETECTED NOT DETECTED Final   Serratia marcescens NOT DETECTED NOT DETECTED Final   Haemophilus influenzae NOT DETECTED NOT DETECTED Final   Neisseria meningitidis NOT DETECTED NOT DETECTED Final   Pseudomonas aeruginosa NOT DETECTED NOT DETECTED Final   Candida albicans NOT DETECTED NOT DETECTED Final   Candida glabrata NOT DETECTED NOT DETECTED Final   Candida krusei NOT DETECTED NOT DETECTED Final   Candida parapsilosis NOT DETECTED NOT DETECTED Final   Candida tropicalis NOT DETECTED NOT DETECTED Final    Comment: Performed at [facility], [place] [address], [place], [place] [address]  Urine culture     Status: Abnormal   Collection Time: 04/05/17  9:31 PM  Result Value Ref Range Status   Specimen Description URINE, RANDOM  Final   Special Requests NONE  Final   Culture >=100,000 COLONIES/mL ESCHERICHIA COLI (A)  Final   Report Status 04/08/2017 FINAL  Final   Organism ID, Bacteria ESCHERICHIA COLI (A)  Final      Susceptibility   Escherichia coli - MIC*    AMPICILLIN >=32 RESISTANT Resistant     CEFAZOLIN 16 SENSITIVE Sensitive     CEFTRIAXONE <=1 SENSITIVE Sensitive     CIPROFLOXACIN <=0.25 SENSITIVE Sensitive     GENTAMICIN <=1 SENSITIVE Sensitive     IMIPENEM <=0.25 SENSITIVE Sensitive     NITROFURANTOIN <=16 SENSITIVE Sensitive     TRIMETH/SULFA <=20 SENSITIVE Sensitive     AMPICILLIN/SULBACTAM >=32 RESISTANT Resistant     PIP/TAZO <=4 SENSITIVE Sensitive     Extended ESBL NEGATIVE Sensitive     * >=100,000 COLONIES/mL ESCHERICHIA COLI  Aerobic/Anaerobic Culture (surgical/deep wound)     Status: None (Preliminary result)   Collection Time: 04/08/17  4:14 PM  Result Value Ref Range  Status   Specimen Description BILE GALL BLADDER  Final   Special Requests Normal  Final   Gram Stain   Final    ABUNDANT WBC PRESENT, PREDOMINANTLY PMN RARE  GRAM POSITIVE COCCI IN PAIRS    Culture   Final    TOO YOUNG TO READ Performed at [facility], [place] [address] [place], [place] [address]    Report Status PENDING  Incomplete    Labs: BNP (last 3 results) No results for input(s): BNP in the last 8760 hours. Basic Metabolic Panel:  Recent Labs Lab 04/05/17 2015 04/06/17 0558 04/07/17 0613 04/08/17 0831 04/09/17 0635  NA 135 136 140 139 137  K 4.2 3.3* 3.1* 4.9 3.9  CL 102 102 110 114* 107  CO2 26 26 21* 17* 22  GLUCOSE 154* 110* 103* 80 85  BUN 16 15 25* 20 17  CREATININE 0.85 0.76 1.11* 0.73 0.66  CALCIUM 9.5 9.4 9.3 9.5 9.3  MG  --   --  1.7 2.2 2.0  PHOS  --   --   --   --  2.7   Liver Function Tests:  Recent Labs Lab 04/05/17 2015 04/06/17 0558 04/07/17 0613 04/08/17 0831 04/09/17 0635  AST 36 24 18 24 21   ALT 14 16 15 15 16   ALKPHOS 79 76 66 60 55  BILITOT 2.2* 1.3* 1.0 1.1 1.0  PROT 8.5* 8.0 7.1 7.5 7.5  ALBUMIN 3.4* 3.1* 2.8* 2.7* 3.0*    Recent Labs Lab 04/05/17 2015  LIPASE 14   No results for input(s): AMMONIA in the last 168 hours. CBC:  Recent Labs Lab 04/05/17 2015 04/06/17 0558 04/07/17 0613 04/08/17 0831 04/09/17 0635  WBC 19.0* 17.6* 17.2* 17.0* 10.0  NEUTROABS 17.2* 15.6*  --  14.3* 7.9*  HGB 12.5 13.8 10.9* 11.2* 10.0*  HCT 37.9 41.8 33.5* 33.7* 30.7*  MCV 92.7 96.1 93.8 95.5 94.5  PLT 315 252 250 272 296   Cardiac Enzymes: No results for input(s): CKTOTAL, CKMB, CKMBINDEX, TROPONINI in the last 168 hours. BNP: Invalid input(s): POCBNP CBG:  Recent Labs Lab 04/08/17 0736 04/08/17 1143 04/08/17 1756 04/09/17 0011 04/09/17 0855  GLUCAP 79 77 78 82 80   D-Dimer No results for input(s): DDIMER in the last 72 hours. Hgb A1c No results for input(s): HGBA1C in the last 72 hours. Lipid Profile No  results for input(s): CHOL, HDL, LDLCALC, TRIG, CHOLHDL, LDLDIRECT in the last 72 hours. Thyroid function studies No results for input(s): TSH, T4TOTAL, T3FREE, THYROIDAB in the last 72 hours.  Invalid input(s): FREET3 Anemia work up No results for input(s): VITAMINB12, FOLATE, FERRITIN, TIBC, IRON, RETICCTPCT in the last 72 hours. Urinalysis    Component Value Date/Time   COLORURINE AMBER (A) 04/05/2017 2131   APPEARANCEUR HAZY (A) 04/05/2017 2131   LABSPEC 1.018 04/05/2017 2131   PHURINE 5.0 04/05/2017 2131   GLUCOSEU NEGATIVE 04/05/2017 2131   HGBUR MODERATE (A) 04/05/2017 2131   BILIRUBINUR NEGATIVE 04/05/2017 2131   KETONESUR NEGATIVE 04/05/2017 2131   PROTEINUR 100 (A) 04/05/2017 2131   UROBILINOGEN 1.0 04/09/2013 0151   NITRITE POSITIVE (A) 04/05/2017 2131   LEUKOCYTESUR NEGATIVE 04/05/2017 2131   Sepsis Labs Invalid input(s): PROCALCITONIN,  WBC,  LACTICIDVEN Microbiology Recent Results (from the past 240 hour(s))  Culture, blood (routine x 2)     Status: None (Preliminary result)   Collection Time: 04/05/17  8:10 PM  Result Value Ref Range Status   Specimen Description BLOOD LEFT HAND  Final   Special Requests IN PEDIATRIC BOTTLE Blood Culture adequate volume  Final   Culture   Final    NO GROWTH 3 DAYS Performed at [facility], [place] [address]  [place], [place] [address]    Report Status PENDING  Incomplete  Culture, blood (routine x 2)     Status: Abnormal   Collection Time: 04/05/17  8:15 PM  Result Value Ref Range Status   Specimen Description BLOOD LEFT ANTECUBITAL  Final   Special Requests   Final    BOTTLES DRAWN AEROBIC AND ANAEROBIC Blood Culture adequate volume   Culture  Setup Time   Final    GRAM POSITIVE COCCI IN CLUSTERS IN BOTH AEROBIC AND ANAEROBIC BOTTLES Organism ID to follow CRITICAL RESULT CALLED TO, READ BACK BY AND VERIFIED WITH: Lavell Luster, PHARMD @0422  04/07/17 MKELLY,MLT    Culture (A)  Final    STAPHYLOCOCCUS SPECIES  (COAGULASE NEGATIVE) THE SIGNIFICANCE OF ISOLATING THIS ORGANISM FROM A SINGLE SET OF BLOOD CULTURES WHEN MULTIPLE SETS ARE DRAWN IS UNCERTAIN. PLEASE NOTIFY THE MICROBIOLOGY DEPARTMENT WITHIN ONE WEEK IF SPECIATION AND SENSITIVITIES ARE REQUIRED. Performed at [facility], [place] [address] [place], [place] [address]    Report Status 04/09/2017 FINAL  Final  Blood Culture ID Panel (Reflexed)     Status: Abnormal   Collection Time: 04/05/17  8:15 PM  Result Value Ref Range Status   Enterococcus species NOT DETECTED NOT DETECTED Final   Listeria monocytogenes NOT DETECTED NOT DETECTED Final   Staphylococcus species DETECTED (A) NOT DETECTED Final    Comment: Methicillin (oxacillin) susceptible coagulase negative staphylococcus. Possible blood culture contaminant (unless isolated from more than one blood culture draw or clinical case suggests pathogenicity). No antibiotic treatment is indicated for blood  culture contaminants. CRITICAL RESULT CALLED TO, READ BACK BY AND VERIFIED WITH: J GRIMSLEY,PHARMD @0422  04/07/17 MKELLY,MLT    Staphylococcus aureus NOT DETECTED NOT DETECTED Final   Methicillin resistance NOT DETECTED NOT DETECTED Final   Streptococcus species NOT DETECTED NOT DETECTED Final   Streptococcus agalactiae NOT DETECTED NOT DETECTED Final   Streptococcus pneumoniae NOT DETECTED NOT DETECTED Final   Streptococcus pyogenes NOT DETECTED NOT DETECTED Final   Acinetobacter baumannii NOT DETECTED NOT DETECTED Final   Enterobacteriaceae species NOT DETECTED NOT DETECTED Final   Enterobacter cloacae complex NOT DETECTED NOT DETECTED Final   Escherichia coli NOT DETECTED NOT DETECTED Final   Klebsiella oxytoca NOT DETECTED NOT DETECTED Final   Klebsiella pneumoniae NOT DETECTED NOT DETECTED Final   Proteus species NOT DETECTED NOT DETECTED Final   Serratia marcescens NOT DETECTED NOT DETECTED Final   Haemophilus influenzae NOT DETECTED NOT DETECTED Final   Neisseria meningitidis  NOT DETECTED NOT DETECTED Final   Pseudomonas aeruginosa NOT DETECTED NOT DETECTED Final   Candida albicans NOT DETECTED NOT DETECTED Final   Candida glabrata NOT DETECTED NOT DETECTED Final   Candida krusei NOT DETECTED NOT DETECTED Final   Candida parapsilosis NOT DETECTED NOT DETECTED Final   Candida tropicalis NOT DETECTED NOT DETECTED Final    Comment: Performed at [facility], [place] [address], [place],  [address]  Urine culture     Status: Abnormal   Collection Time: 04/05/17  9:31 PM  Result Value Ref Range Status   Specimen Description URINE, RANDOM  Final   Special Requests NONE  Final   Culture >=100,000 COLONIES/mL ESCHERICHIA COLI (A)  Final   Report Status 04/08/2017 FINAL  Final   Organism ID, Bacteria ESCHERICHIA COLI (A)  Final      Susceptibility   Escherichia coli - MIC*    AMPICILLIN >=32 RESISTANT Resistant     CEFAZOLIN 16 SENSITIVE Sensitive  CEFTRIAXONE <=1 SENSITIVE Sensitive     CIPROFLOXACIN <=0.25 SENSITIVE Sensitive     GENTAMICIN <=1 SENSITIVE Sensitive     IMIPENEM <=0.25 SENSITIVE Sensitive     NITROFURANTOIN <=16 SENSITIVE Sensitive     TRIMETH/SULFA <=20 SENSITIVE Sensitive     AMPICILLIN/SULBACTAM >=32 RESISTANT Resistant     PIP/TAZO <=4 SENSITIVE Sensitive     Extended ESBL NEGATIVE Sensitive     * >=100,000 COLONIES/mL ESCHERICHIA COLI  Aerobic/Anaerobic Culture (surgical/deep wound)     Status: None (Preliminary result)   Collection Time: 04/08/17  4:14 PM  Result Value Ref Range Status   Specimen Description BILE GALL BLADDER  Final   Special Requests Normal  Final   Gram Stain   Final    ABUNDANT WBC PRESENT, PREDOMINANTLY PMN RARE GRAM POSITIVE COCCI IN PAIRS    Culture   Final    TOO YOUNG TO READ Performed at [facility], [address] [place], [place] [address]    Report Status PENDING  Incomplete   Time coordinating discharge: 40 minutes  SIGNED:  Kerney Elbe, DO [facility] 04/09/2017, 4:25 PM Pager [phone]  If 7PM-7AM, please contact night-coverage [email or web address] Password [email or web address]

## 2017-04-09 NOTE — Progress Notes (Signed)
Referring Physician(s): Melba  Supervising Physician: Daryll Brod  Patient Status:  [facility] - In-pt  Chief Complaint:  cholecystitis  Subjective: Pt doing fairly well; does have some intermittent RUQ discomfort, occ nausea   Allergies: Asa [aspirin]; Phosphate; Codeine; and Penicillins  Medications: Prior to Admission medications   Medication Sig Start Date End Date Taking? Authorizing Provider  acetaminophen (TYLENOL) 325 MG tablet Take 2 tablets (650 mg total) by mouth every 6 (six) hours as needed. Patient taking differently: Take 650 mg by mouth every 6 (six) hours as needed for mild pain or moderate pain.  04/11/13  Yes Marianne L York, PA-C  amLODipine (NORVASC) 10 MG tablet Take 1 tablet (10 mg total) by mouth daily. 03/24/13  Yes Lauree Chandler, NP  Biotin 1000 MCG tablet Take 1 tablet (1 mg total) by mouth daily. 03/24/13  Yes Lauree Chandler, NP  Calcium Carbonate-Vitamin D (CALCIUM + D PO) Take 1 tablet by mouth daily.   Yes Historical Provider, MD  clopidogrel (PLAVIX) 75 MG tablet Take 1 tablet (75 mg total) by mouth daily. 03/24/13  Yes Lauree Chandler, NP  cyanocobalamin (,VITAMIN B-12,) 1000 MCG/ML injection Inject 1 mL (1,000 mcg total) into the muscle every 30 (thirty) days. 03/24/13  Yes Lauree Chandler, NP  furosemide (LASIX) 40 MG tablet Take 1 tablet (40 mg total) by mouth daily with breakfast. 04/11/13  Yes Melton Alar, PA-C  mineral oil-hydrophilic petrolatum (AQUAPHOR) ointment Apply 1 application topically daily. To lower extremities 03/24/13  Yes Lauree Chandler, NP  Multiple Vitamin (MULTIVITAMIN WITH MINERALS) TABS Take 1 tablet by mouth daily. 03/24/13  Yes Lauree Chandler, NP  potassium chloride SA (K-DUR,KLOR-CON) 20 MEQ tablet Take 1 tablet (20 mEq total) by mouth daily. 04/11/13  Yes Bobby Rumpf York, PA-C  zolpidem (AMBIEN) 5 MG tablet Take 1 tablet (5 mg total) by mouth at bedtime as needed for sleep (insomnia). 04/11/13  Yes Bobby Rumpf  York, PA-C     Vital Signs: BP (!) 153/74 (BP Location: Right Arm)   Pulse 82   Temp 99 F (37.2 C) (Oral)   Resp 18   Ht 5\' 3"  (1.6 m)   Wt 250 lb (113.4 kg)   SpO2 94%   BMI 44.29 kg/m   Physical Exam GB drain intact, dressing dry, site mild-mod tender,output 200 cc golden bile; cx pending  Imaging: Nm Hepatobiliary Liver Func  Result Date: 04/07/2017 CLINICAL DATA:  Acute cholecystitis EXAM: NUCLEAR MEDICINE HEPATOBILIARY IMAGING TECHNIQUE: Sequential images of the abdomen were obtained out to 60 minutes following intravenous administration of radiopharmaceutical. RADIOPHARMACEUTICALS:  5.45 mCi Tc-79m Choletec IV. This was subsequently followed by 4.5 mg of morphine IV and imaging performed for an additional 30 minutes after the gallbladder was not visualized at the 60 minute mark. COMPARISON:  04/06/2017 abdomen ultrasound, 04/05/2017 CT FINDINGS: The gallbladder was not visualized during the first 50 minutes post injection of Choletec. Patency of the common bile duct with small bowel activity is demonstrated with washout of the liver seen. No masslike abnormality of the liver is identified. The patient was then administered 4.5 mg of morphine and an additional 30 minutes of imaging was performed. The gallbladder was still not visualized. IMPRESSION: Nonvisualization of the gallbladder consistent with findings of acute cholecystitis. Visualization of small bowel is in keeping a patent common bile duct. Electronically Signed   By: Ashley Royalty M.D.   On: 04/07/2017 22:28   Ct Abdomen Pelvis W Contrast  Result Date: 04/05/2017 CLINICAL DATA:  83 y/o  F; right upper quadrant pain with vomiting. EXAM: CT ABDOMEN AND PELVIS WITH CONTRAST TECHNIQUE: Multidetector CT imaging of the abdomen and pelvis was performed using the standard protocol following bolus administration of intravenous contrast. CONTRAST:  100 ISOVUE-300 IOPAMIDOL (ISOVUE-300) INJECTION 61% COMPARISON:  10/11/2012 CT abdomen  and pelvis. FINDINGS: Lower chest: Negative. Hepatobiliary: Scattered punctate calcifications compatible with old granulomatous disease. No additional focal liver lesion identified. Wall thickening and surrounding inflammatory fat stranding of distended gallbladder. No intrahepatic biliary ductal dilatation. No radiopaque gallstone identified. Pancreas: Unremarkable. No pancreatic ductal dilatation or surrounding inflammatory changes. Spleen: Punctate calcifications compatible sequelae of prior granulomatous disease. Adrenals/Urinary Tract: Stable left kidney interpolar 11 mm cyst. No hydronephrosis. No appreciable urinary stone disease. Normal underdistended bladder. Normal adrenal glands. Stomach/Bowel: Small hiatal hernia. Moderate sigmoid diverticulosis without evidence for diverticulitis. No obstructive or inflammatory changes of the bowel. Normal appendix. Vascular/Lymphatic: Aortic atherosclerosis with moderate calcification. No enlarged abdominal or pelvic lymph nodes. Reproductive: Stable position of intrauterine device. No adnexal mass identified. Other: No abdominal wall hernia or abnormality. No abdominopelvic ascites. Musculoskeletal: Left iliopsoas muscle atrophy. Diffuse sclerosis of the lumbar vertebral bodies and advanced lumbar degenerative changes with both discogenic disease and advanced facet arthropathy. Multifactorial moderate to severe canal stenosis at the L3-4 through L5-S1 levels. No acute fracture identified. IMPRESSION: 1. Wall thickening and surrounding inflammatory changes of dilated gallbladder may represent acute cholecystitis in the appropriate clinical setting. 2. Small hiatal hernia. 3. Moderate sigmoid diverticulosis. 4. Moderate calcific aortic atherosclerosis. 5. Left iliopsoas atrophy either related to disuse or denervation. 6. Extensive sclerotic changes of the lumbar vertebral bodies, question sclerotic metastatic disease to the bones, bony infarcts, or advanced degenerative  disease. No acute fracture identified. 7. Lumbar spondylosis with moderate to severe canal stenosis at the L3-4 through L5-S1 levels. Electronically Signed   By: Kristine Garbe M.D.   On: 04/05/2017 23:27   US Abdomen Limited  Result Date: 04/06/2017 CLINICAL DATA:  Cholecystitis EXAM: US ABDOMEN LIMITED - RIGHT UPPER QUADRANT COMPARISON:  04/05/2017 CT scan FINDINGS: Gallbladder: Distended gallbladder is noted. There is thickening of gallbladder wall up to 5.2 mm. No sonographic Murphy's sign. Sludge and gallstones are noted within gallbladder. The largest gallstone measures 1.4 cm. Common bile duct: Diameter: Limited visualization measures 3.6 cm in diameter. Suboptimal exam due to abundant bowel gas. Liver: No focal lesion identified. Within normal limits in parenchymal echogenicity. IMPRESSION: 1. Distended gallbladder with thickening of the wall up to 5.2 mm. Although there is no sonographic Murphy's sign acute cholecystitis cannot be excluded. Clinical correlation is necessary. Sludge and layering gallstone within gallbladder the largest measures 1.4 cm. Normal CBD. No focal hepatic mass. Electronically Signed   By: Lahoma Crocker M.D.   On: 04/06/2017 08:21   Ir Perc Cholecystostomy  Result Date: 04/08/2017 INDICATION: Acute cholecystitis and need for percutaneous cholecystostomy has the patient is currently not a candidate for operative cholecystectomy. EXAM: CHOLECYSTOSTOMY MEDICATIONS: None ANESTHESIA/SEDATION: Moderate (conscious) sedation was employed during this procedure. A total of Versed 4.0 mg and Fentanyl 100 mcg was administered intravenously. Moderate Sedation Time: 24 minutes. The patient's level of consciousness and vital signs were monitored continuously by radiology nursing throughout the procedure under my direct supervision. FLUOROSCOPY TIME:  Fluoroscopy Time: 1 minute and 6 seconds.(68 mGy). COMPLICATIONS: None immediate. PROCEDURE: Informed written consent was obtained from  the patient after a thorough discussion of the procedural risks, benefits and alternatives. All questions were addressed.  Maximal Sterile Barrier Technique was utilized including caps, mask, sterile gowns, sterile gloves, sterile drape, hand hygiene and skin antiseptic. A timeout was performed prior to the initiation of the procedure. Ultrasound was utilized to localize the gallbladder. Under direct ultrasound guidance, a 21 gauge needle was advanced into the gallbladder lumen. Contrast injection was performed under fluoroscopy. A transitional dilator was placed. Over a guidewire, the percutaneous tract was dilated and a 10 French percutaneous drainage catheter placed. Additional bile sample was aspirated and sent for culture analysis. The catheter was injected with diluted contrast and a fluoroscopic spot image obtained. The catheter was attached to a gravity drainage bag. The catheter was secured at the skin with a Prolene retention suture and StatLock device. FINDINGS: Purulent bile was aspirated from the gallbladder lumen. The catheter was formed in the gallbladder and is draining well after placement. IMPRESSION: Placement of 10 French percutaneous cholecystostomy tube into the gallbladder lumen. There was return of purulent bile and a sample was sent for culture analysis. The drainage catheter was connected to gravity bag drainage. Electronically Signed   By: Aletta Edouard M.D.   On: 04/08/2017 17:27   Dg Chest Port 1 View  Result Date: 04/05/2017 CLINICAL DATA:  Acute onset of vomiting and diarrhea. Left-sided chest pain. Initial encounter. EXAM: PORTABLE CHEST 1 VIEW COMPARISON:  Chest radiograph performed 02/11/2016 FINDINGS: The lungs are hypoexpanded. Mild vascular crowding and vascular congestion are seen. Increased interstitial markings could reflect minimal interstitial edema. There is no evidence of pleural effusion or pneumothorax. The cardiomediastinal silhouette is within normal limits. No  acute osseous abnormalities are seen. Chronic degenerative change is noted at the glenohumeral joints, with joint space loss and sclerosis. IMPRESSION: 1. Lungs hypoexpanded. Vascular congestion. Increased interstitial markings could reflect minimal interstitial edema. 2. Chronic degenerative change at the glenohumeral joints, with joint space loss and sclerosis. Electronically Signed   By: Garald Balding M.D.   On: 04/05/2017 21:34    Labs:  CBC:  Recent Labs  04/06/17 0558 04/07/17 0613 04/08/17 0831 04/09/17 0635  WBC 17.6* 17.2* 17.0* 10.0  HGB 13.8 10.9* 11.2* 10.0*  HCT 41.8 33.5* 33.7* 30.7*  PLT 252 250 272 296    COAGS:  Recent Labs  04/08/17 0500  INR 1.37    BMP:  Recent Labs  04/06/17 0558 04/07/17 0613 04/08/17 0831 04/09/17 0635  NA 136 140 139 137  K 3.3* 3.1* 4.9 3.9  CL 102 110 114* 107  CO2 26 21* 17* 22  GLUCOSE 110* 103* 80 85  BUN 15 25* 20 17  CALCIUM 9.4 9.3 9.5 9.3  CREATININE 0.76 1.11* 0.73 0.66  GFRNONAA >60 45* >60 >60  GFRAA >60 53* >60 >60    LIVER FUNCTION TESTS:  Recent Labs  04/06/17 0558 04/07/17 0613 04/08/17 0831 04/09/17 0635  BILITOT 1.3* 1.0 1.1 1.0  AST 24 18 24 21   ALT 16 15 15 16   ALKPHOS 76 66 60 55  PROT 8.0 7.1 7.5 7.5  ALBUMIN 3.1* 2.8* 2.7* 3.0*    Assessment and Plan: Acute cholecystitis, s/p perc cholecystostomy 4/11; temp 99; WBC nl; hgb 10 (11.2), LFT's nl; urine cx- e coli; bile cx pend; cont drain irrigation; drain will need to remain in place for at least 4-6 weeks unless cholecystectomy done in interim; additional plans as per CCS/IM; she can f/u in IR drain clinic near time of CCS visit to coordinate care of drain/further disposition   Electronically Signed: D. Rowe Robert 04/09/2017, 4:05 PM  I spent a total of 15 minutes at the the patient's bedside AND on the patient's hospital floor or unit, greater than 50% of which was counseling/coordinating care for percutaneous  cholecystostomy    Patient ID: OSHA RANE, female   DOB: 1933-09-07, 83 y.o.   MRN: [id number]

## 2017-04-09 NOTE — Progress Notes (Addendum)
CSW received a call from the pt's RN that the PTAR had arrived to transport the pt to [facility] and that upon learning she was being D/C'd to [facility] the pt had refused saying she didn't know she was being D/C'd and that she did not want to go to [facility].  Pt denied accepting [facility] and stated she "thought I would be signing something saying I accepted".  CSW called pt's daughter Genelle Gather at ph: [phone] who will arrive in [place] mid-morning on Saturday 4/13.  Pt's daugher Ronney Lion stated she didn't think the pt "really understood" that she was being discharged. Daughter Ronney Lion stated she had spoken to the pt's caregiver Joycelyn Schmid who stated she was unaware pt was being D/C'd.  CSW called pt's caregiver Lewie Loron at [phone] who stated she was unaware of pt's D/C but that the pt stated to her that the "[place] next door to ya'll" was the pt's first choice and that "her second choice was [facility]".  CSW believes caregiver is referring to "[facility]" due to it's location on [address] near [facility].  When asked by the CSW later the pt stated [facility] was her first choice.  Joycelyn Schmid stated that she believes that if the patient doesn't get into [facility] then she will have to go to [facility].  Margaret further stated that the pt's daughter Genelle Gather will need to be notified of any changes in the future regarding the pt.  10:13 PM CSW met with pt who states that her first choice is [facility] and that she would like a list of all SNF's that have accepted her.  CSW asked the pt again if she would be willing to transport to [facility] and pt refused saying her father was at [facility] and she didn't like it there.  CSW will highlight all SNF's that have accepted her and provide that list to the pt.    Alphonse Guild. Oona Trammel, Latanya Presser, LCAS Clinical Social Worker Ph:  [phone]

## 2017-04-09 NOTE — Clinical Social Work Placement (Signed)
   CLINICAL SOCIAL WORK PLACEMENT  NOTE  Date:  04/09/2017  Patient Details  Name: Connie Miranda MRN: [id number] Date of Birth: 03/30/1933  Clinical Social Work is seeking post-discharge placement for this patient at the [place] level of care (*CSW will initial, date and re-position this form in  chart as items are completed):  Yes   Patient/family provided with [facility] [place] Work Department's list of facilities offering this level of care within the geographic area requested by the patient (or if unable, by the patient's family).  Yes   Patient/family informed of their freedom to choose among providers that offer the needed level of care, that participate in Medicare, Medicaid or managed care program needed by the patient, have an available bed and are willing to accept the patient.  Yes   Patient/family informed of [facility]'s ownership interest in [facility] and [facility], as well as of the fact that they are under no obligation to receive care at these facilities.  PASRR submitted to EDS on       PASRR number received on 04/08/17     Existing PASRR number confirmed on       FL2 transmitted to all facilities in geographic area requested by pt/family on       FL2 transmitted to all facilities within larger geographic area on 04/08/17     Patient informed that his/her managed care company has contracts with or will negotiate with certain facilities, including the following:        Yes   Patient/family informed of bed offers received.  Patient chooses bed at [facility]     Physician recommends and patient chooses bed at [facility]    Patient to be transferred to [facility] on 04/09/17.  Patient to be transferred to facility by PTAR     Patient family notified on   of transfer.  Name of family member notified:        PHYSICIAN Please prepare priority discharge summary, including medications      Additional Comment:    _______________________________________________ Nila Nephew, LCSW 04/09/2017, 4:08 PM

## 2017-04-09 NOTE — Plan of Care (Signed)
RN paged because transportation is here and pt is refusing to be discharged. Per RN note, pt states she didn't know she was being d/c'd today and didn't want to go to [facility]. The notes on the chart by the social worker clearly state that pt chose to go to [facility] and d/c was planned for today.  KJKG, NP [facility]

## 2017-04-09 NOTE — Progress Notes (Signed)
LCSW following for discharge planning/disposition:  Pt DC to [facility]. [facility] representative confirms bed ready for pt at 17:00.  Patient will transport by PTAR- CSW called and arranged transportation. Family notified regarding discharge - by patient.  All information sent to facility by [place] Report number for RN:  [phone]  No other needs at this time   Plan: DC to [facility].  Sharren Bridge, MSW, LCSW Clinical Social Work 04/09/2017 [phone]

## 2017-04-09 NOTE — Progress Notes (Signed)
CSW met with the pt's charge nurse and requested the on-call provider be notified that pt will be D/C'ing on 4/13.  Pt's CN verified that the on-call provider was aware and noted the change in the pt's chart.     CSW met with the pt again and provided the pt with a list of facilities that had accepted the pt.  CSW informed the pt that the CSW on-duty in the AM will be by to visit the pt regarding D/C.  Pt state she understood, thanked the CSW and was appreciative of the CSW's efforts.  2nd shift ED CSW will update the AM CSW.assigned to the pt.  CSW signing off.   Connie Miranda. Connie Miranda, Connie Miranda, LCAS Clinical Social Worker Ph: [phone]

## 2017-04-09 NOTE — Progress Notes (Signed)
[facility] Progress Note     Subjective: CC: abdominal pain Intermittent cramping abdominal pain around drain site. Denies nausea/vomiting. Tolerating diet. +flatus.   Objective: Vital signs in last 24 hours: Temp:  [98.3 F (36.8 C)-99.3 F (37.4 C)] 99.1 F (37.3 C) (04/12 0445) Pulse Rate:  [73-88] 78 (04/12 0445) Resp:  [14-26] 18 (04/12 0445) BP: (132-170)/(59-82) 160/59 (04/12 0445) SpO2:  [93 %-100 %] 93 % (04/12 0445) Last BM Date: 04/05/17  Intake/Output from previous day: 04/11 0701 - 04/12 0700 In: 1410 [IV Piggyback:1400] Out: 203 [Urine:3; Drains:200] Intake/Output this shift: No intake/output data recorded.  PE: Gen: alert and answering questions appropriately. NAD CV: regular rate and rhythm, pedal pulses palpable  Pulm: Normal respiratory effort  Abd: Soft, obese, good bowel sounds, RUQ drain in place with clean dressing -- 200 cc/24h light brown/bilious output  Ext: edema and chronic skin changes of bilateral lower extremities  Psych: A&Ox3  Lab Results:   Recent Labs  04/08/17 0831 04/09/17 0635  WBC 17.0* 10.0  HGB 11.2* 10.0*  HCT 33.7* 30.7*  PLT 272 296   BMET  Recent Labs  04/08/17 0831 04/09/17 0635  NA 139 137  K 4.9 3.9  CL 114* 107  CO2 17* 22  GLUCOSE 80 85  BUN 20 17  CREATININE 0.73 0.66  CALCIUM 9.5 9.3   PT/INR  Recent Labs  04/08/17 0500  LABPROT 17.0*  INR 1.37   CMP     Component Value Date/Time   NA 137 04/09/2017 0635   K 3.9 04/09/2017 0635   CL 107 04/09/2017 0635   CO2 22 04/09/2017 0635   GLUCOSE 85 04/09/2017 0635   BUN 17 04/09/2017 0635   CREATININE 0.66 04/09/2017 0635   CALCIUM 9.3 04/09/2017 0635   PROT 7.5 04/09/2017 0635   ALBUMIN 3.0 (L) 04/09/2017 0635   AST 21 04/09/2017 0635   ALT 16 04/09/2017 0635   ALKPHOS 55 04/09/2017 0635   BILITOT 1.0 04/09/2017 0635   GFRNONAA >60 04/09/2017 0635   GFRAA >60 04/09/2017 0635   Lipase     Component Value Date/Time   LIPASE 14 04/05/2017 2015       Studies/Results: Nm Hepatobiliary Liver Func  Result Date: 04/07/2017 CLINICAL DATA:  Acute cholecystitis EXAM: NUCLEAR MEDICINE HEPATOBILIARY IMAGING TECHNIQUE: Sequential images of the abdomen were obtained out to 60 minutes following intravenous administration of radiopharmaceutical. RADIOPHARMACEUTICALS:  5.45 mCi Tc-76m Choletec IV. This was subsequently followed by 4.5 mg of morphine IV and imaging performed for an additional 30 minutes after the gallbladder was not visualized at the 60 minute mark. COMPARISON:  04/06/2017 abdomen ultrasound, 04/05/2017 CT FINDINGS: The gallbladder was not visualized during the first 50 minutes post injection of Choletec. Patency of the common bile duct with small bowel activity is demonstrated with washout of the liver seen. No masslike abnormality of the liver is identified. The patient was then administered 4.5 mg of morphine and an additional 30 minutes of imaging was performed. The gallbladder was still not visualized. IMPRESSION: Nonvisualization of the gallbladder consistent with findings of acute cholecystitis. Visualization of small bowel is in keeping a patent common bile duct. Electronically Signed   By: Ashley Royalty M.D.   On: 04/07/2017 22:28   Ir Perc Cholecystostomy  Result Date: 04/08/2017 INDICATION: Acute cholecystitis and need for percutaneous cholecystostomy has the patient is currently not a candidate for operative cholecystectomy. EXAM: CHOLECYSTOSTOMY MEDICATIONS: None ANESTHESIA/SEDATION: Moderate (conscious) sedation was employed during this procedure. A total  of Versed 4.0 mg and Fentanyl 100 mcg was administered intravenously. Moderate Sedation Time: 24 minutes. The patient's level of consciousness and vital signs were monitored continuously by radiology nursing throughout the procedure under my direct supervision. FLUOROSCOPY TIME:  Fluoroscopy Time: 1 minute and 6 seconds.(68 mGy). COMPLICATIONS: None  immediate. PROCEDURE: Informed written consent was obtained from the patient after a thorough discussion of the procedural risks, benefits and alternatives. All questions were addressed. Maximal Sterile Barrier Technique was utilized including caps, mask, sterile gowns, sterile gloves, sterile drape, hand hygiene and skin antiseptic. A timeout was performed prior to the initiation of the procedure. Ultrasound was utilized to localize the gallbladder. Under direct ultrasound guidance, a 21 gauge needle was advanced into the gallbladder lumen. Contrast injection was performed under fluoroscopy. A transitional dilator was placed. Over a guidewire, the percutaneous tract was dilated and a 10 French percutaneous drainage catheter placed. Additional bile sample was aspirated and sent for culture analysis. The catheter was injected with diluted contrast and a fluoroscopic spot image obtained. The catheter was attached to a gravity drainage bag. The catheter was secured at the skin with a Prolene retention suture and StatLock device. FINDINGS: Purulent bile was aspirated from the gallbladder lumen. The catheter was formed in the gallbladder and is draining well after placement. IMPRESSION: Placement of 10 French percutaneous cholecystostomy tube into the gallbladder lumen. There was return of purulent bile and a sample was sent for culture analysis. The drainage catheter was connected to gravity bag drainage. Electronically Signed   By: Aletta Edouard M.D.   On: 04/08/2017 17:27    Anti-infectives: Anti-infectives    Start     Dose/Rate Route Frequency Ordered Stop   04/06/17 2200  levofloxacin (LEVAQUIN) IVPB 750 mg     750 mg 100 mL/hr over 90 Minutes Intravenous Every 24 hours 04/05/17 2308     04/06/17 0600  metroNIDAZOLE (FLAGYL) IVPB 500 mg     500 mg 100 mL/hr over 60 Minutes Intravenous Every 8 hours 04/05/17 2308     04/05/17 2215  levofloxacin (LEVAQUIN) IVPB 750 mg     750 mg 100 mL/hr over 90  Minutes Intravenous  Once 04/05/17 2200 04/05/17 2358   04/05/17 2215  metroNIDAZOLE (FLAGYL) IVPB 500 mg     500 mg 100 mL/hr over 60 Minutes Intravenous  Once 04/05/17 2200 04/05/17 2359       Assessment/Plan Acute cholecystitis  S/P Percutaneous 53F cholecystostomy tube placement 04/08/17 Dr. Kathlene Cote- purulent and bilious fluid, culture taken. - GS growing rare gram positive cocci in pairs, Cx Pending. - Hx of plavix for CVA, last dose 4/8      Hyperbilirubinemia - resolved Leukocytosis: resolved s/p cholecystostomy  UTI -E.coli   Hypertension Diastolic heart dysfunction - EF 60-65% in 2013 G6PD Lymphedema  Bed bound  FEN: full liquid diet - advance as tolerated to low fat diet ID: Levaquin, Flagyl VTE: SCD's, Lovenox    LOS: 2 days   Plan: follow cultures, mobilize Pt will require outpatient general surgery follow up in 4 weeks.    LOS: 3 days    Jill Alexanders , [facility] 04/09/2017, 10:16 AM Pager: [phone] Consults: [phone] Mon-Fri 7:00 am-4:30 pm Sat-Sun 7:00 am-11:30 am

## 2017-04-10 LAB — GLUCOSE, CAPILLARY
GLUCOSE-CAPILLARY: 107 mg/dL — AB (ref 65–99)
Glucose-Capillary: 105 mg/dL — ABNORMAL HIGH (ref 65–99)

## 2017-04-10 NOTE — Discharge Summary (Signed)
Physician Discharge Summary  Connie Miranda QIO:[id number] DOB: 1933-12-08 DOA: 04/05/2017  PCP: Philis Fendt, MD  Admit date: 04/05/2017 Discharge date: 04/10/2017  Admitted From: Home Disposition:  SNF  ADDENDUM to Previous D/C Summary: Patient was seen and examined today and did not go to SNF because she refused her placement. Willing to go today. No new clinical changes or updates since yesterday. Patient medically stable at this time and will D/C to SNF.  Recommendations for Outpatient Follow-up:  1. Follow up with PCP in 1-2 weeks 2. Have Drain remain in for at least 4-6 Weeks unless Cholecystectomy is done 3. Will need to follow up with IR [facility] near General Surgery Appointment 4. Follow up with General Surgery within 4 week 5. Please obtain CMP/CBC in one week 6. Please follow up on the following pending results: Bile Cultures as was re-incubated for better   Home Health: No Equipment/Devices: None  Discharge Condition: Stable CODE STATUS: FULL CODE Diet recommendation: Heart Healthy / Carb Modified / Regular / Dysphagia   Brief/Interim Summary: Zynasia Burklow Speightis a 83 y.o.femalewith history of hypertension, diastolic dysfunction and chronically bedbound with chronic lymphedema presented to the ER because of abdominal pain with nausea. Found to have E. Coli UTI and Acute Cholecystitis. Perc Cholecystostomy done 04/08/17 and patient improved. Discussed with ID Dr. Linus Salmons who recommending placing patient on po Keflex for total of 10 day Abx duration. Patient will need to follow up with [facility] and General Surgery within 4 weeks. IR Drain to remain in for at least 4-6 weeks unless patient has Cholecystectomy done. Patient deemed medically stable to be transferred to SNF at this time.   Discharge Diagnoses:  Principal Problem:   Abdominal pain Active Problems:   Hypertension   Pernicious anemia   Glucose-6-phosphate dehydrogenase deficiency (HCC)   Lymphedema    Morbid obesity (HCC)   Lower extremity weakness  Sepsis from Cholecystitis and UTI, sp Perc Chole Drain -presented on admission with fever 101.2, leukocytosis wbc 19, lactic acidosis , lactic acid 2.8, ecoli UTIand Cholecystitis  -Blood culture 1/2 with coag negative staph, possible contamination  -Sepsis Physiology improved -Urine + ecoli -Was on abx with Levofloxacin 750 mg IV q24h and Flagyl 500 mg IV q8h and after discussion with Dr. Linus Salmons ID recommending narrowing to Keflex 500 mg po 4x daily; Patient has PCN listed as allergy but not a true allergy as does not cause anaphylaxis  -WBC went from 17.2 -> 17.0 -> 10.0  Acute Cholecystitis s/p Perc Cholecystostomy -[place] and CT findings of a thickened gallbladder wall and gallstones. -[place] with negative sonographic murphy's sign She did presented to the ED with ab pain and nausea, She currently denies ab pain, still some nausea, and some vomiting -Hida scan showed nonvisualization of GB consistent with Acute Cholecystitis, -General surgery and IR follwed; IR placed Perc Drain -Diet advanced -Follow up with General Surgery in 4 weeks and follow up with IR Drain clinic close to General Surgery appointment -Have Drain Remain in for at least 4-6 weeks  Hypokalemia: -Improved -K+ went from 3.1 -> 3.9 after repletion -Replete as Necessary  -Repeat CMP at SNF  Hypertension-  -Was npo sp home meds norvasc held, Resume Home Norvasc and Lasix -Was on IV hydralazine prn while hospitalized -Avoid betablocker due to brief sinus pause on tele  Diastolic dysfunction -last EF measured in 2013 was 60-65% with grade 1 diastolic dysfunction -  -She received brief hydration in the ED,  -Home med  Lasix was on hold, she is not on ivf currently, -Continue Home Lasix  H/o CVA :  -Plavix held (last dose on 4/8) and will restart at D/C  Morbid obesity: -Body mass index is 44.29 kg/m.  Chronic lymphedema and chronically bedbound -Appreciate  WOC nurse and PT Recommends SNF  Possible h/o of G6PD -Continue to Monitor  Discharge Instructions  Discharge Instructions    Call MD for:  difficulty breathing, headache or visual disturbances    Complete by:  As directed    Call MD for:  extreme fatigue    Complete by:  As directed    Call MD for:  hives    Complete by:  As directed    Call MD for:  persistant dizziness or light-headedness    Complete by:  As directed    Call MD for:  persistant nausea and vomiting    Complete by:  As directed    Call MD for:  redness, tenderness, or signs of infection (pain, swelling, redness, odor or green/yellow discharge around incision site)    Complete by:  As directed    Call MD for:  severe uncontrolled pain    Complete by:  As directed    Call MD for:  temperature >100.4    Complete by:  As directed    Diet - low sodium heart healthy    Complete by:  As directed    Discharge instructions    Complete by:  As directed    Follow up Care at SNF. Follow up with General Surgery within 4 weeks and follow up with IR for Drain Management in 6-8 weeks. Take all medications as prescribed. If symptoms change or worsen please return to the ED for evaluation.   Increase activity slowly    Complete by:  As directed      Allergies as of 04/10/2017      Reactions   Asa [aspirin] Other (See Comments)   Doctor told her not to take due to type anemia   Phosphate Other (See Comments)   Anything containing phosphate 6. Unknown reaction   Codeine Other (See Comments)   Made her too sleepy   Penicillins Itching, Swelling   Tolerates keflex      Medication List    TAKE these medications   acetaminophen 325 MG tablet Commonly known as:  TYLENOL Take 2 tablets (650 mg total) by mouth every 6 (six) hours as needed. What changed:  reasons to take this   amLODipine 10 MG tablet Commonly known as:  NORVASC Take 1 tablet (10 mg total) by mouth daily.   Biotin 1000 MCG tablet Take 1 tablet (1 mg  total) by mouth daily.   CALCIUM + D PO Take 1 tablet by mouth daily.   cephALEXin 500 MG capsule Commonly known as:  KEFLEX Take 1 capsule (500 mg total) by mouth every 6 (six) hours.   clopidogrel 75 MG tablet Commonly known as:  PLAVIX Take 1 tablet (75 mg total) by mouth daily.   cyanocobalamin 1000 MCG/ML injection Commonly known as:  (VITAMIN B-12) Inject 1 mL (1,000 mcg total) into the muscle every 30 (thirty) days.   furosemide 40 MG tablet Commonly known as:  LASIX Take 1 tablet (40 mg total) by mouth daily with breakfast.   mineral oil-hydrophilic petrolatum ointment Apply 1 application topically daily. To lower extremities   multivitamin with minerals Tabs tablet Take 1 tablet by mouth daily.   potassium chloride SA 20 MEQ tablet Commonly known  as:  K-DUR,KLOR-CON Take 1 tablet (20 mEq total) by mouth daily.   zolpidem 5 MG tablet Commonly known as:  AMBIEN Take 1 tablet (5 mg total) by mouth at bedtime as needed for sleep (insomnia).       Contact information for follow-up providers    Philis Fendt, MD. Call in 1 week(s).   Specialty:  Internal Medicine Why:  Call to schedule within 1 week Contact information: [place] [address] [phone]        Rosario Adie., MD Follow up in 4 week(s).   Specialty:  General Surgery Why:  Follow up in 4 weeks with General Surgery Contact information: [address] [place] [address] [phone]        Azzie Roup, MD Follow up.   Specialty:  Interventional Radiology Why:  Follow up in 6 to 8 weeks Contact information: [place] [address] [phone]            Contact information for after-discharge care    [facility] Follow up.   Specialty:  Skilled Nursing Facility Contact information: [address] [address] [phone]                  Allergies  Allergen Reactions  . Asa [Aspirin] Other (See Comments)    Doctor told her not to take due to type anemia  . Phosphate Other (See Comments)    Anything containing phosphate 6. Unknown reaction  . Codeine Other (See Comments)    Made her too sleepy  . Penicillins Itching and Swelling    Tolerates keflex   Consultations:  General Surgery  Interventional Radiology  Procedures/Studies: Nm Hepatobiliary Liver Func  Result Date: 04/07/2017 CLINICAL DATA:  Acute cholecystitis EXAM: NUCLEAR MEDICINE HEPATOBILIARY IMAGING TECHNIQUE: Sequential images of the abdomen were obtained out to 60 minutes following intravenous administration of radiopharmaceutical. RADIOPHARMACEUTICALS:  5.45 mCi Tc-41m Choletec IV. This was subsequently followed by 4.5 mg of morphine IV and imaging performed for an additional 30 minutes after the gallbladder was not visualized at the 60 minute mark. COMPARISON:  04/06/2017 abdomen ultrasound, 04/05/2017 CT FINDINGS: The gallbladder was not visualized during the first 50 minutes post injection of Choletec. Patency of the common bile duct with small bowel activity is demonstrated with washout of the liver seen. No masslike abnormality of the liver is identified. The patient was then administered 4.5 mg of morphine and an additional 30 minutes of imaging was performed. The gallbladder was still not visualized. IMPRESSION: Nonvisualization of the gallbladder consistent with findings of acute cholecystitis. Visualization of small bowel is in keeping a patent common bile duct. Electronically Signed   By: Ashley Royalty M.D.   On: 04/07/2017 22:28   Ct Abdomen Pelvis W Contrast  Result Date: 04/05/2017 CLINICAL DATA:  83 y/o  F; right upper quadrant pain with vomiting. EXAM: CT ABDOMEN AND PELVIS WITH CONTRAST TECHNIQUE: Multidetector CT imaging of the abdomen and pelvis was performed using the standard protocol following bolus administration of intravenous contrast.  CONTRAST:  100 ISOVUE-300 IOPAMIDOL (ISOVUE-300) INJECTION 61% COMPARISON:  10/11/2012 CT abdomen and pelvis. FINDINGS: Lower chest: Negative. Hepatobiliary: Scattered punctate calcifications compatible with old granulomatous disease. No additional focal liver lesion identified. Wall thickening and surrounding inflammatory fat stranding of distended gallbladder. No intrahepatic biliary ductal dilatation. No radiopaque gallstone identified. Pancreas: Unremarkable. No pancreatic ductal dilatation or surrounding inflammatory changes. Spleen:  Punctate calcifications compatible sequelae of prior granulomatous disease. Adrenals/Urinary Tract: Stable left kidney interpolar 11 mm cyst. No hydronephrosis. No appreciable urinary stone disease. Normal underdistended bladder. Normal adrenal glands. Stomach/Bowel: Small hiatal hernia. Moderate sigmoid diverticulosis without evidence for diverticulitis. No obstructive or inflammatory changes of the bowel. Normal appendix. Vascular/Lymphatic: Aortic atherosclerosis with moderate calcification. No enlarged abdominal or pelvic lymph nodes. Reproductive: Stable position of intrauterine device. No adnexal mass identified. Other: No abdominal wall hernia or abnormality. No abdominopelvic ascites. Musculoskeletal: Left iliopsoas muscle atrophy. Diffuse sclerosis of the lumbar vertebral bodies and advanced lumbar degenerative changes with both discogenic disease and advanced facet arthropathy. Multifactorial moderate to severe canal stenosis at the L3-4 through L5-S1 levels. No acute fracture identified. IMPRESSION: 1. Wall thickening and surrounding inflammatory changes of dilated gallbladder may represent acute cholecystitis in the appropriate clinical setting. 2. Small hiatal hernia. 3. Moderate sigmoid diverticulosis. 4. Moderate calcific aortic atherosclerosis. 5. Left iliopsoas atrophy either related to disuse or denervation. 6. Extensive sclerotic changes of the lumbar vertebral  bodies, question sclerotic metastatic disease to the bones, bony infarcts, or advanced degenerative disease. No acute fracture identified. 7. Lumbar spondylosis with moderate to severe canal stenosis at the L3-4 through L5-S1 levels. Electronically Signed   By: Kristine Garbe M.D.   On: 04/05/2017 23:27   US Abdomen Limited  Result Date: 04/06/2017 CLINICAL DATA:  Cholecystitis EXAM: US ABDOMEN LIMITED - RIGHT UPPER QUADRANT COMPARISON:  04/05/2017 CT scan FINDINGS: Gallbladder: Distended gallbladder is noted. There is thickening of gallbladder wall up to 5.2 mm. No sonographic Murphy's sign. Sludge and gallstones are noted within gallbladder. The largest gallstone measures 1.4 cm. Common bile duct: Diameter: Limited visualization measures 3.6 cm in diameter. Suboptimal exam due to abundant bowel gas. Liver: No focal lesion identified. Within normal limits in parenchymal echogenicity. IMPRESSION: 1. Distended gallbladder with thickening of the wall up to 5.2 mm. Although there is no sonographic Murphy's sign acute cholecystitis cannot be excluded. Clinical correlation is necessary. Sludge and layering gallstone within gallbladder the largest measures 1.4 cm. Normal CBD. No focal hepatic mass. Electronically Signed   By: Lahoma Crocker M.D.   On: 04/06/2017 08:21   Ir Perc Cholecystostomy  Result Date: 04/08/2017 INDICATION: Acute cholecystitis and need for percutaneous cholecystostomy has the patient is currently not a candidate for operative cholecystectomy. EXAM: CHOLECYSTOSTOMY MEDICATIONS: None ANESTHESIA/SEDATION: Moderate (conscious) sedation was employed during this procedure. A total of Versed 4.0 mg and Fentanyl 100 mcg was administered intravenously. Moderate Sedation Time: 24 minutes. The patient's level of consciousness and vital signs were monitored continuously by radiology nursing throughout the procedure under my direct supervision. FLUOROSCOPY TIME:  Fluoroscopy Time: 1 minute and 6  seconds.(68 mGy). COMPLICATIONS: None immediate. PROCEDURE: Informed written consent was obtained from the patient after a thorough discussion of the procedural risks, benefits and alternatives. All questions were addressed. Maximal Sterile Barrier Technique was utilized including caps, mask, sterile gowns, sterile gloves, sterile drape, hand hygiene and skin antiseptic. A timeout was performed prior to the initiation of the procedure. Ultrasound was utilized to localize the gallbladder. Under direct ultrasound guidance, a 21 gauge needle was advanced into the gallbladder lumen. Contrast injection was performed under fluoroscopy. A transitional dilator was placed. Over a guidewire, the percutaneous tract was dilated and a 10 French percutaneous drainage catheter placed. Additional bile sample was aspirated and sent for culture analysis. The catheter was injected with diluted contrast and a fluoroscopic spot image obtained. The catheter was attached to a gravity  drainage bag. The catheter was secured at the skin with a Prolene retention suture and StatLock device. FINDINGS: Purulent bile was aspirated from the gallbladder lumen. The catheter was formed in the gallbladder and is draining well after placement. IMPRESSION: Placement of 10 French percutaneous cholecystostomy tube into the gallbladder lumen. There was return of purulent bile and a sample was sent for culture analysis. The drainage catheter was connected to gravity bag drainage. Electronically Signed   By: Aletta Edouard M.D.   On: 04/08/2017 17:27   Dg Chest Port 1 View  Result Date: 04/05/2017 CLINICAL DATA:  Acute onset of vomiting and diarrhea. Left-sided chest pain. Initial encounter. EXAM: PORTABLE CHEST 1 VIEW COMPARISON:  Chest radiograph performed 02/11/2016 FINDINGS: The lungs are hypoexpanded. Mild vascular crowding and vascular congestion are seen. Increased interstitial markings could reflect minimal interstitial edema. There is no  evidence of pleural effusion or pneumothorax. The cardiomediastinal silhouette is within normal limits. No acute osseous abnormalities are seen. Chronic degenerative change is noted at the glenohumeral joints, with joint space loss and sclerosis. IMPRESSION: 1. Lungs hypoexpanded. Vascular congestion. Increased interstitial markings could reflect minimal interstitial edema. 2. Chronic degenerative change at the glenohumeral joints, with joint space loss and sclerosis. Electronically Signed   By: Garald Balding M.D.   On: 04/05/2017 21:34    Subjective: Seen and examined at bedside and was feeling better. No Nausea or Vomiting or CP. No other concerns or complaints.  Discharge Exam: Vitals:   04/09/17 2230 04/10/17 0404  BP: 135/89 130/87  Pulse: 87 67  Resp: 18 18  Temp: 97 F (36.1 C) 97.4 F (36.3 C)   Vitals:   04/09/17 0445 04/09/17 1541 04/09/17 2230 04/10/17 0404  BP: (!) 160/59 (!) 153/74 135/89 130/87  Pulse: 78 82 87 67  Resp: 18 18 18 18   Temp: 99.1 F (37.3 C) 99 F (37.2 C) 97 F (36.1 C) 97.4 F (36.3 C)  TempSrc: Oral Oral Oral Oral  SpO2: 93% 94% 96% 96%  Weight:      Height:       General: Pt is alert, awake, not in acute distress Cardiovascular: RRR, S1/S2 +, no rubs, no gallops Respiratory: CTA bilaterally, no wheezing, no rhonchi Abdominal: Soft, NT, ND, bowel sounds +, Perc Chole Drain in place draining Extremities: Chronic lower extremity edema/lymphedema with scaling, no cyanosis  The results of significant diagnostics from this hospitalization (including imaging, microbiology, ancillary and laboratory) are listed below for reference.    Microbiology: Recent Results (from the past 240 hour(s))  Culture, blood (routine x 2)     Status: None (Preliminary result)   Collection Time: 04/05/17  8:10 PM  Result Value Ref Range Status   Specimen Description BLOOD LEFT HAND  Final   Special Requests IN PEDIATRIC BOTTLE Blood Culture adequate volume  Final    Culture   Final    NO GROWTH 4 DAYS Performed at [facility], [address] [place], [place] [address]    Report Status PENDING  Incomplete  Culture, blood (routine x 2)     Status: Abnormal   Collection Time: 04/05/17  8:15 PM  Result Value Ref Range Status   Specimen Description BLOOD LEFT ANTECUBITAL  Final   Special Requests   Final    BOTTLES DRAWN AEROBIC AND ANAEROBIC Blood Culture adequate volume   Culture  Setup Time   Final    GRAM POSITIVE COCCI IN CLUSTERS IN BOTH AEROBIC AND ANAEROBIC BOTTLES Organism ID to  follow CRITICAL RESULT CALLED TO, READ BACK BY AND VERIFIED WITH: Lavell Luster, PHARMD @0422  04/07/17 MKELLY,MLT    Culture (A)  Final    STAPHYLOCOCCUS SPECIES (COAGULASE NEGATIVE) THE SIGNIFICANCE OF ISOLATING THIS ORGANISM FROM A SINGLE SET OF BLOOD CULTURES WHEN MULTIPLE SETS ARE DRAWN IS UNCERTAIN. PLEASE NOTIFY THE MICROBIOLOGY DEPARTMENT WITHIN ONE WEEK IF SPECIATION AND SENSITIVITIES ARE REQUIRED. Performed at [facility], [place] [address], [place], [place] [address]    Report Status 04/09/2017 FINAL  Final  Blood Culture ID Panel (Reflexed)     Status: Abnormal   Collection Time: 04/05/17  8:15 PM  Result Value Ref Range Status   Enterococcus species NOT DETECTED NOT DETECTED Final   Listeria monocytogenes NOT DETECTED NOT DETECTED Final   Staphylococcus species DETECTED (A) NOT DETECTED Final    Comment: Methicillin (oxacillin) susceptible coagulase negative staphylococcus. Possible blood culture contaminant (unless isolated from more than one blood culture draw or clinical case suggests pathogenicity). No antibiotic treatment is indicated for blood  culture contaminants. CRITICAL RESULT CALLED TO, READ BACK BY AND VERIFIED WITH: J GRIMSLEY,PHARMD @0422  04/07/17 MKELLY,MLT    Staphylococcus aureus NOT DETECTED NOT DETECTED Final   Methicillin resistance NOT DETECTED NOT DETECTED Final   Streptococcus species NOT DETECTED NOT DETECTED Final    Streptococcus agalactiae NOT DETECTED NOT DETECTED Final   Streptococcus pneumoniae NOT DETECTED NOT DETECTED Final   Streptococcus pyogenes NOT DETECTED NOT DETECTED Final   Acinetobacter baumannii NOT DETECTED NOT DETECTED Final   Enterobacteriaceae species NOT DETECTED NOT DETECTED Final   Enterobacter cloacae complex NOT DETECTED NOT DETECTED Final   Escherichia coli NOT DETECTED NOT DETECTED Final   Klebsiella oxytoca NOT DETECTED NOT DETECTED Final   Klebsiella pneumoniae NOT DETECTED NOT DETECTED Final   Proteus species NOT DETECTED NOT DETECTED Final   Serratia marcescens NOT DETECTED NOT DETECTED Final   Haemophilus influenzae NOT DETECTED NOT DETECTED Final   Neisseria meningitidis NOT DETECTED NOT DETECTED Final   Pseudomonas aeruginosa NOT DETECTED NOT DETECTED Final   Candida albicans NOT DETECTED NOT DETECTED Final   Candida glabrata NOT DETECTED NOT DETECTED Final   Candida krusei NOT DETECTED NOT DETECTED Final   Candida parapsilosis NOT DETECTED NOT DETECTED Final   Candida tropicalis NOT DETECTED NOT DETECTED Final    Comment: Performed at [facility], [place] [address], [place], [place] [address]  Urine culture     Status: Abnormal   Collection Time: 04/05/17  9:31 PM  Result Value Ref Range Status   Specimen Description URINE, RANDOM  Final   Special Requests NONE  Final   Culture >=100,000 COLONIES/mL ESCHERICHIA COLI (A)  Final   Report Status 04/08/2017 FINAL  Final   Organism ID, Bacteria ESCHERICHIA COLI (A)  Final      Susceptibility   Escherichia coli - MIC*    AMPICILLIN >=32 RESISTANT Resistant     CEFAZOLIN 16 SENSITIVE Sensitive     CEFTRIAXONE <=1 SENSITIVE Sensitive     CIPROFLOXACIN <=0.25 SENSITIVE Sensitive     GENTAMICIN <=1 SENSITIVE Sensitive     IMIPENEM <=0.25 SENSITIVE Sensitive     NITROFURANTOIN <=16 SENSITIVE Sensitive     TRIMETH/SULFA <=20 SENSITIVE Sensitive     AMPICILLIN/SULBACTAM >=32 RESISTANT Resistant     PIP/TAZO <=4  SENSITIVE Sensitive     Extended ESBL NEGATIVE Sensitive     * >=100,000 COLONIES/mL ESCHERICHIA COLI  Aerobic/Anaerobic Culture (surgical/deep wound)     Status: None (Preliminary result)  Collection Time: 04/08/17  4:14 PM  Result Value Ref Range Status   Specimen Description BILE GALL BLADDER  Final   Special Requests Normal  Final   Gram Stain   Final    ABUNDANT WBC PRESENT, PREDOMINANTLY PMN RARE GRAM POSITIVE COCCI IN PAIRS    Culture   Final    TOO YOUNG TO READ Performed at [facility], [place] [address], [place], [place] [address]    Report Status PENDING  Incomplete    Labs: BNP (last 3 results) No results for input(s): BNP in the last 8760 hours. Basic Metabolic Panel:  Recent Labs Lab 04/05/17 2015 04/06/17 0558 04/07/17 0613 04/08/17 0831 04/09/17 0635  NA 135 136 140 139 137  K 4.2 3.3* 3.1* 4.9 3.9  CL 102 102 110 114* 107  CO2 26 26 21* 17* 22  GLUCOSE 154* 110* 103* 80 85  BUN 16 15 25* 20 17  CREATININE 0.85 0.76 1.11* 0.73 0.66  CALCIUM 9.5 9.4 9.3 9.5 9.3  MG  --   --  1.7 2.2 2.0  PHOS  --   --   --   --  2.7   Liver Function Tests:  Recent Labs Lab 04/05/17 2015 04/06/17 0558 04/07/17 0613 04/08/17 0831 04/09/17 0635  AST 36 24 18 24 21   ALT 14 16 15 15 16   ALKPHOS 79 76 66 60 55  BILITOT 2.2* 1.3* 1.0 1.1 1.0  PROT 8.5* 8.0 7.1 7.5 7.5  ALBUMIN 3.4* 3.1* 2.8* 2.7* 3.0*    Recent Labs Lab 04/05/17 2015  LIPASE 14   No results for input(s): AMMONIA in the last 168 hours. CBC:  Recent Labs Lab 04/05/17 2015 04/06/17 0558 04/07/17 0613 04/08/17 0831 04/09/17 0635  WBC 19.0* 17.6* 17.2* 17.0* 10.0  NEUTROABS 17.2* 15.6*  --  14.3* 7.9*  HGB 12.5 13.8 10.9* 11.2* 10.0*  HCT 37.9 41.8 33.5* 33.7* 30.7*  MCV 92.7 96.1 93.8 95.5 94.5  PLT 315 252 250 272 296   Cardiac Enzymes: No results for input(s): CKTOTAL, CKMB, CKMBINDEX, TROPONINI in the last 168 hours. BNP: Invalid input(s): POCBNP CBG:  Recent Labs Lab  04/08/17 1756 04/09/17 0011 04/09/17 0855 04/10/17 0029 04/10/17 0730  GLUCAP 78 82 80 107* 105*   D-Dimer No results for input(s): DDIMER in the last 72 hours. Hgb A1c No results for input(s): HGBA1C in the last 72 hours. Lipid Profile No results for input(s): CHOL, HDL, LDLCALC, TRIG, CHOLHDL, LDLDIRECT in the last 72 hours. Thyroid function studies No results for input(s): TSH, T4TOTAL, T3FREE, THYROIDAB in the last 72 hours.  Invalid input(s): FREET3 Anemia work up No results for input(s): VITAMINB12, FOLATE, FERRITIN, TIBC, IRON, RETICCTPCT in the last 72 hours. Urinalysis    Component Value Date/Time   COLORURINE AMBER (A) 04/05/2017 2131   APPEARANCEUR HAZY (A) 04/05/2017 2131   LABSPEC 1.018 04/05/2017 2131   PHURINE 5.0 04/05/2017 2131   GLUCOSEU NEGATIVE 04/05/2017 2131   HGBUR MODERATE (A) 04/05/2017 2131   BILIRUBINUR NEGATIVE 04/05/2017 2131   KETONESUR NEGATIVE 04/05/2017 2131   PROTEINUR 100 (A) 04/05/2017 2131   UROBILINOGEN 1.0 04/09/2013 0151   NITRITE POSITIVE (A) 04/05/2017 2131   LEUKOCYTESUR NEGATIVE 04/05/2017 2131   [facility] Invalid input(s): PROCALCITONIN,  WBC,  LACTICIDVEN Microbiology Recent Results (from the past 240 hour(s))  Culture, blood (routine x 2)     Status: None (Preliminary result)   Collection Time: 04/05/17  8:10 PM  Result Value Ref Range Status  Specimen Description BLOOD LEFT HAND  Final   Special Requests IN PEDIATRIC BOTTLE Blood Culture adequate volume  Final   Culture   Final    NO GROWTH 4 DAYS Performed at [facility], [address] [place], [place] [address]    Report Status PENDING  Incomplete  Culture, blood (routine x 2)     Status: Abnormal   Collection Time: 04/05/17  8:15 PM  Result Value Ref Range Status   Specimen Description BLOOD LEFT ANTECUBITAL  Final   Special Requests   Final    BOTTLES DRAWN AEROBIC AND ANAEROBIC Blood Culture adequate volume   Culture  Setup Time   Final    GRAM  POSITIVE COCCI IN CLUSTERS IN BOTH AEROBIC AND ANAEROBIC BOTTLES Organism ID to follow CRITICAL RESULT CALLED TO, READ BACK BY AND VERIFIED WITH: Lavell Luster, PHARMD @0422  04/07/17 MKELLY,MLT    Culture (A)  Final    STAPHYLOCOCCUS SPECIES (COAGULASE NEGATIVE) THE SIGNIFICANCE OF ISOLATING THIS ORGANISM FROM A SINGLE SET OF BLOOD CULTURES WHEN MULTIPLE SETS ARE DRAWN IS UNCERTAIN. PLEASE NOTIFY THE MICROBIOLOGY DEPARTMENT WITHIN ONE WEEK IF SPECIATION AND SENSITIVITIES ARE REQUIRED. Performed at [facility], [place] [address], [place], [place] [address]    Report Status 04/09/2017 FINAL  Final  Blood Culture ID Panel (Reflexed)     Status: Abnormal   Collection Time: 04/05/17  8:15 PM  Result Value Ref Range Status   Enterococcus species NOT DETECTED NOT DETECTED Final   Listeria monocytogenes NOT DETECTED NOT DETECTED Final   Staphylococcus species DETECTED (A) NOT DETECTED Final    Comment: Methicillin (oxacillin) susceptible coagulase negative staphylococcus. Possible blood culture contaminant (unless isolated from more than one blood culture draw or clinical case suggests pathogenicity). No antibiotic treatment is indicated for blood  culture contaminants. CRITICAL RESULT CALLED TO, READ BACK BY AND VERIFIED WITH: J GRIMSLEY,PHARMD @0422  04/07/17 MKELLY,MLT    Staphylococcus aureus NOT DETECTED NOT DETECTED Final   Methicillin resistance NOT DETECTED NOT DETECTED Final   Streptococcus species NOT DETECTED NOT DETECTED Final   Streptococcus agalactiae NOT DETECTED NOT DETECTED Final   Streptococcus pneumoniae NOT DETECTED NOT DETECTED Final   Streptococcus pyogenes NOT DETECTED NOT DETECTED Final   Acinetobacter baumannii NOT DETECTED NOT DETECTED Final   Enterobacteriaceae species NOT DETECTED NOT DETECTED Final   Enterobacter cloacae complex NOT DETECTED NOT DETECTED Final   Escherichia coli NOT DETECTED NOT DETECTED Final   Klebsiella oxytoca NOT DETECTED NOT DETECTED Final    Klebsiella pneumoniae NOT DETECTED NOT DETECTED Final   Proteus species NOT DETECTED NOT DETECTED Final   Serratia marcescens NOT DETECTED NOT DETECTED Final   Haemophilus influenzae NOT DETECTED NOT DETECTED Final   Neisseria meningitidis NOT DETECTED NOT DETECTED Final   Pseudomonas aeruginosa NOT DETECTED NOT DETECTED Final   Candida albicans NOT DETECTED NOT DETECTED Final   Candida glabrata NOT DETECTED NOT DETECTED Final   Candida krusei NOT DETECTED NOT DETECTED Final   Candida parapsilosis NOT DETECTED NOT DETECTED Final   Candida tropicalis NOT DETECTED NOT DETECTED Final    Comment: Performed at [facility], [address], [place], [place] [address]  Urine culture     Status: Abnormal   Collection Time: 04/05/17  9:31 PM  Result Value Ref Range Status   Specimen Description URINE, RANDOM  Final   Special Requests NONE  Final   Culture >=100,000 COLONIES/mL ESCHERICHIA COLI (A)  Final   Report Status 04/08/2017 FINAL  Final   Organism ID, Bacteria ESCHERICHIA COLI (A)  Final      Susceptibility   Escherichia coli - MIC*    AMPICILLIN >=32 RESISTANT Resistant     CEFAZOLIN 16 SENSITIVE Sensitive     CEFTRIAXONE <=1 SENSITIVE Sensitive     CIPROFLOXACIN <=0.25 SENSITIVE Sensitive     GENTAMICIN <=1 SENSITIVE Sensitive     IMIPENEM <=0.25 SENSITIVE Sensitive     NITROFURANTOIN <=16 SENSITIVE Sensitive     TRIMETH/SULFA <=20 SENSITIVE Sensitive     AMPICILLIN/SULBACTAM >=32 RESISTANT Resistant     PIP/TAZO <=4 SENSITIVE Sensitive     Extended ESBL NEGATIVE Sensitive     * >=100,000 COLONIES/mL ESCHERICHIA COLI  Aerobic/Anaerobic Culture (surgical/deep wound)     Status: None (Preliminary result)   Collection Time: 04/08/17  4:14 PM  Result Value Ref Range Status   Specimen Description BILE GALL BLADDER  Final   Special Requests Normal  Final   Gram Stain   Final    ABUNDANT WBC PRESENT, PREDOMINANTLY PMN RARE GRAM POSITIVE COCCI IN PAIRS    Culture   Final     TOO YOUNG TO READ Performed at [facility], [address] [place], [place] [address]    Report Status PENDING  Incomplete   Time coordinating discharge: 40 minutes  SIGNED:  Kerney Elbe, DO [facility] 04/10/2017, 11:44 AM Pager [phone]  If 7PM-7AM, please contact night-coverage [email or web address] Password [email or web address]

## 2017-04-10 NOTE — Clinical Social Work Note (Signed)
Pt has chosen to go to [facility] for [facility].  Pt is ready for d/c and will be transported by EMS.  CSW arranged transport, provided discharge documentation to the facility and facility information to RN.  CSW signing off as there are no further needs at this time.  [place], [place]

## 2017-04-10 NOTE — Progress Notes (Signed)
Patient discharged to [facility] via ambulance. Patient alert and oriented, Patient in no distress. During transportation.  No pressure ulcer noted, BLE discolored dry/flaky. Report given to Rush Landmark, nurse at [facility].

## 2017-04-10 NOTE — Clinical Social Work Placement (Signed)
   CLINICAL SOCIAL WORK PLACEMENT  NOTE  Date:  04/10/2017  Patient Details  Name: Connie Miranda MRN: [id number] Date of Birth: 05-Jun-1933  Clinical Social Work is seeking post-discharge placement for this patient at the [place] level of care (*CSW will initial, date and re-position this form in  chart as items are completed):  Yes   Patient/family provided with [facility] Work Department's list of facilities offering this level of care within the geographic area requested by the patient (or if unable, by the patient's family).  Yes   Patient/family informed of their freedom to choose among providers that offer the needed level of care, that participate in Medicare, Medicaid or managed care program needed by the patient, have an available bed and are willing to accept the patient.  Yes   Patient/family informed of [facility]'s ownership interest in [facility] and [facility], as well as of the fact that they are under no obligation to receive care at these facilities.  PASRR submitted to EDS on       PASRR number received on 04/08/17     Existing PASRR number confirmed on       FL2 transmitted to all facilities in geographic area requested by pt/family on       FL2 transmitted to all facilities within larger geographic area on 04/08/17     Patient informed that his/her managed care company has contracts with or will negotiate with certain facilities, including the following:        Yes   Patient/family informed of bed offers received.  Patient chooses bed at [facility], [facility]     Physician recommends and patient chooses bed at      Patient to be transferred to [facility], [facility] on 04/10/17.  Patient to be transferred to facility by [facility]     Patient family notified on 04/10/17 of transfer.  Name of family member notified:        PHYSICIAN Please prepare  prescriptions     Additional Comment:    _______________________________________________ Naida Sleight, LCSW 04/10/2017, 3:26 PM

## 2017-04-10 NOTE — Care Management Important Message (Signed)
Important Message  Patient Details  Name: Connie Miranda MRN: [id number] Date of Birth: [birth year]   Medicare Important Message Given:  Yes    Kerin Salen 04/10/2017, 9:45 AMImportant Message  Patient Details  Name: Connie Miranda MRN: [id number] Date of Birth: 1933/03/09   Medicare Important Message Given:  Yes    Kerin Salen 04/10/2017, 9:45 AM

## 2017-04-11 LAB — CULTURE, BLOOD (ROUTINE X 2)
CULTURE: NO GROWTH
SPECIAL REQUESTS: ADEQUATE

## 2017-04-13 ENCOUNTER — Other Ambulatory Visit: Payer: Self-pay | Admitting: General Surgery

## 2017-04-13 DIAGNOSIS — K810 Acute cholecystitis: Secondary | ICD-10-CM

## 2017-04-13 LAB — AEROBIC/ANAEROBIC CULTURE (SURGICAL/DEEP WOUND): SPECIAL REQUESTS: NORMAL

## 2017-04-13 LAB — AEROBIC/ANAEROBIC CULTURE W GRAM STAIN (SURGICAL/DEEP WOUND)

## 2017-04-13 NOTE — Progress Notes (Signed)
Finalized Bile culture results faxed to Butch Penny, RN at [facility].  She will report them to house PA-C.  Heide Guile, PharmD, BCPS-AQ ID Clinical Pharmacist Pager [phone]

## 2017-05-20 ENCOUNTER — Ambulatory Visit
Admission: RE | Admit: 2017-05-20 | Discharge: 2017-05-20 | Disposition: A | Discharge status: Home / Self Care | Payer: Medicare Other | Source: Ambulatory Visit | Attending: General Surgery | Admitting: General Surgery

## 2017-05-20 ENCOUNTER — Other Ambulatory Visit (HOSPITAL_COMMUNITY): Payer: Self-pay | Admitting: Interventional Radiology

## 2017-05-20 DIAGNOSIS — K819 Cholecystitis, unspecified: Secondary | ICD-10-CM

## 2017-05-20 DIAGNOSIS — K810 Acute cholecystitis: Secondary | ICD-10-CM

## 2017-05-20 HISTORY — PX: IR RADIOLOGIST EVAL & MGMT: IMG5224

## 2017-05-20 NOTE — Progress Notes (Signed)
Referring Physician(s): Thomas,Alicia  Chief Complaint: The patient is seen in follow up today s/p percutaneous cholecystostomy on 04/08/17  History of present illness: Ms. Connie Miranda is an 83 year old female with history of acute calculus cholecystitis in April of this year, status post percutaneous cholecystostomy on 04/08/17. Bile cultures at the time grew streptococcus gallolyticus as well as enterobacter.She has completed antibiotic therapy.She currently resides at [facility] and presents today for follow-up drain evaluation and injection. According to the patient she has been doing well since discharge. She currently denies any fevers, chest pain, worsening abdominal pain, nausea, vomiting or abnormal bleeding. She is eating okay. Drain appears to be functioning properly. There was approximately 150 mL of dark green bile in drain bag.The drain is currently not being flushed.   Past Medical History:  Diagnosis Date  . Edema   . Edema of both legs 01/2016  . Glucose-6-phosphate dehydrogenase deficiency (HCC)    possible diagnosis of G6PD per patient "phosphate 6 deficiency"  . Hypertension   . Osteoarthritis   . Pernicious anemia   . Stroke [facility]) 09/2000   couldn't move her left foot which resolved    Past Surgical History:  Procedure Laterality Date  . BONE MARROW BIOPSY  12/1966   for anemia  . IR PERC CHOLECYSTOSTOMY  04/08/2017  . MULTIPLE TOOTH EXTRACTIONS    . OTHER SURGICAL HISTORY     cyst from panty line removed as child    Allergies: Asa [aspirin]; Phosphate; Codeine; and Penicillins  Medications: Prior to Admission medications   Medication Sig Start Date End Date Taking? Authorizing Provider  acetaminophen (TYLENOL) 325 MG tablet Take 2 tablets (650 mg total) by mouth every 6 (six) hours as needed. Patient taking differently: Take 650 mg by mouth every 6 (six) hours as needed for mild pain or moderate pain.  04/11/13   York, Marianne L, PA-C    amLODipine (NORVASC) 10 MG tablet Take 1 tablet (10 mg total) by mouth daily. 03/24/13   Lauree Chandler, NP  Biotin 1000 MCG tablet Take 1 tablet (1 mg total) by mouth daily. 03/24/13   Lauree Chandler, NP  Calcium Carbonate-Vitamin D (CALCIUM + D PO) Take 1 tablet by mouth daily.    [provider]  clopidogrel (PLAVIX) 75 MG tablet Take 1 tablet (75 mg total) by mouth daily. 03/24/13   Lauree Chandler, NP  cyanocobalamin (,VITAMIN B-12,) 1000 MCG/ML injection Inject 1 mL (1,000 mcg total) into the muscle every 30 (thirty) days. 03/24/13   Lauree Chandler, NP  furosemide (LASIX) 40 MG tablet Take 1 tablet (40 mg total) by mouth daily with breakfast. 04/11/13   York, Marianne L, PA-C  mineral oil-hydrophilic petrolatum (AQUAPHOR) ointment Apply 1 application topically daily. To lower extremities 03/24/13   Lauree Chandler, NP  Multiple Vitamin (MULTIVITAMIN WITH MINERALS) TABS Take 1 tablet by mouth daily. 03/24/13   Lauree Chandler, NP  potassium chloride SA (K-DUR,KLOR-CON) 20 MEQ tablet Take 1 tablet (20 mEq total) by mouth daily. 04/11/13   York, Marianne L, PA-C  zolpidem (AMBIEN) 5 MG tablet Take 1 tablet (5 mg total) by mouth at bedtime as needed for sleep (insomnia). 04/11/13   Melton Alar, PA-C     Family History  Problem Relation Age of Onset  . Diabetes Brother   . Diabetes Brother   . Heart failure Mother 37  . Heart attack Father 33    Social History   Social History  . Marital  status: Married    Spouse name: N/A  . Number of children: N/A  . Years of education: N/A   Social History Main Topics  . Smoking status: Former Smoker    Types: Cigarettes    Quit date: 12/29/1993  . Smokeless tobacco: Never Used  . Alcohol use No  . Drug use: No  . Sexual activity: Not on file   Other Topics Concern  . Not on file   Social History Narrative   Live alone in house and uses a cane and wheelchair to get around.  She has not been upstairs in over a  year.  She does not drive, but she uses a cab or bus to go to grocery store, but usually friends and family bring her things.  Niece and nephew in [place].       Vital Signs: BP (!) 180/78   Pulse 84   Temp 98.7 F (37.1 C)   SpO2 97%   Physical Exam awake, alert. Chest clear to auscultation bilaterally. Heart with regular rate and rhythm. Abdomen obese, soft, positive bowel sounds, intact right upper quadrant drain with 150 mL of dark green bile in bag; insertion site okay, minimal tenderness; no apparent drainage at area.  Imaging: No results found.  Labs:  CBC:  Recent Labs  04/06/17 0558 04/07/17 0613 04/08/17 0831 04/09/17 0635  WBC 17.6* 17.2* 17.0* 10.0  HGB 13.8 10.9* 11.2* 10.0*  HCT 41.8 33.5* 33.7* 30.7*  PLT 252 250 272 296    COAGS:  Recent Labs  04/08/17 0500  INR 1.37    BMP:  Recent Labs  04/06/17 0558 04/07/17 0613 04/08/17 0831 04/09/17 0635  NA 136 140 139 137  K 3.3* 3.1* 4.9 3.9  CL 102 110 114* 107  CO2 26 21* 17* 22  GLUCOSE 110* 103* 80 85  BUN 15 25* 20 17  CALCIUM 9.4 9.3 9.5 9.3  CREATININE 0.76 1.11* 0.73 0.66  GFRNONAA >60 45* >60 >60  GFRAA >60 53* >60 >60    LIVER FUNCTION TESTS:  Recent Labs  04/06/17 0558 04/07/17 0613 04/08/17 0831 04/09/17 0635  BILITOT 1.3* 1.0 1.1 1.0  AST _0 ALT _1 ALKPHOS 76 66 60 55  PROT 8.0 7.1 7.5 7.5  ALBUMIN 3.1* 2.8* 2.7* 3.0*    Assessment:  Patient with history of acute calculus cholecystitis in April of this year, status post percutaneous cholecystostomy on 04/08/17. At the time she was felt to not be good surgical candidate.She is currently stable with properly functioning gallbladder drain. Drain injection today reveals patent cystic duct with free flowing of contrast to the duodenum. Patient is scheduled to follow-up with Dr. Marcello Moores on 05/29/17. In the interim the patient's drain was capped and she was given an extra bag to reattach if she becomes  symptomatic with increasing abdominal pain, nausea, vomiting or fever. Above instructions were discussed with patient and written down on accompanying paperwork for nursing staff to see.   Signed: D. Rowe Robert, PA-C 05/20/2017, 1:40 PM   Please refer to Dr. Katrinka Blazing attestation of this note for management and plan.      Patient ID: Connie Miranda, female   DOB: [birth year], 83 y.o.   MRN: [id number]

## 2017-06-11 ENCOUNTER — Encounter (HOSPITAL_COMMUNITY): Payer: Self-pay | Admitting: Interventional Radiology

## 2017-06-11 ENCOUNTER — Other Ambulatory Visit (HOSPITAL_COMMUNITY): Payer: Self-pay | Admitting: Interventional Radiology

## 2017-06-11 ENCOUNTER — Ambulatory Visit (HOSPITAL_COMMUNITY)
Admission: RE | Admit: 2017-06-11 | Discharge: 2017-06-11 | Disposition: A | Discharge status: Home / Self Care | Payer: Medicare Other | Source: Ambulatory Visit | Attending: Interventional Radiology | Admitting: Interventional Radiology

## 2017-06-11 DIAGNOSIS — K819 Cholecystitis, unspecified: Secondary | ICD-10-CM

## 2017-06-11 DIAGNOSIS — K8020 Calculus of gallbladder without cholecystitis without obstruction: Secondary | ICD-10-CM | POA: Insufficient documentation

## 2017-06-11 DIAGNOSIS — Z4803 Encounter for change or removal of drains: Secondary | ICD-10-CM | POA: Diagnosis present

## 2017-06-11 HISTORY — PX: IR SINUS/FIST TUBE CHK-NON GI: IMG673

## 2017-06-11 MED ORDER — LIDOCAINE HCL 1 % IJ SOLN
INTRAMUSCULAR | Status: AC
  Filled 2017-06-11: qty 20

## 2017-06-11 MED ORDER — IOPAMIDOL (ISOVUE-300) INJECTION 61%
INTRAVENOUS | Status: AC
  Administered 2017-06-11: 10 mL
  Filled 2017-06-11: qty 50

## 2017-06-11 NOTE — Procedures (Signed)
The patient has successfully passed trial of cholecystostomy tube capping / internalization.   Repeat cholangiogram demonstrated cholelithiasis without evidence of choledocholithiasis.  This was discussed with referring surgeon, Dr. Dorita Fray states the patient remains a poor surgical candidate.    The options of proceeding with routine cholecystostomy tube exchanges versus definitive catheter removal were discussed with the patient and the patient's caretaker.  I explained that if the chole tube is removed and she were develop recurrent obstructive symptoms, necessitating placement of a new cholecystostomy tube, the tube would likely be considered permanent at that time.   Following this prolonged a detailed conversation, the patient wishes to have the cholecystostomy tube removed.  The chole tube was removed at the bedside without incident.  Ronny Bacon, MD Pager #: [phone]

## 2017-06-12 ENCOUNTER — Encounter: Payer: Self-pay | Admitting: General Surgery

## 2017-07-03 IMAGING — RF DG CHOLANGIOGRAM VIA EXIST CATHETER
2 series · 5 of 5 positions shown · non-contrast
Comparison: none

INDICATION: 83-year-old female with a history of acute calculus cholecystitis
treated by placement of a percutaneous cholecystostomy tube on
04/08/2017. She presents for drain injection.

[Series 1: one shot · 1 of 1 slices shown]
[im 1/1]
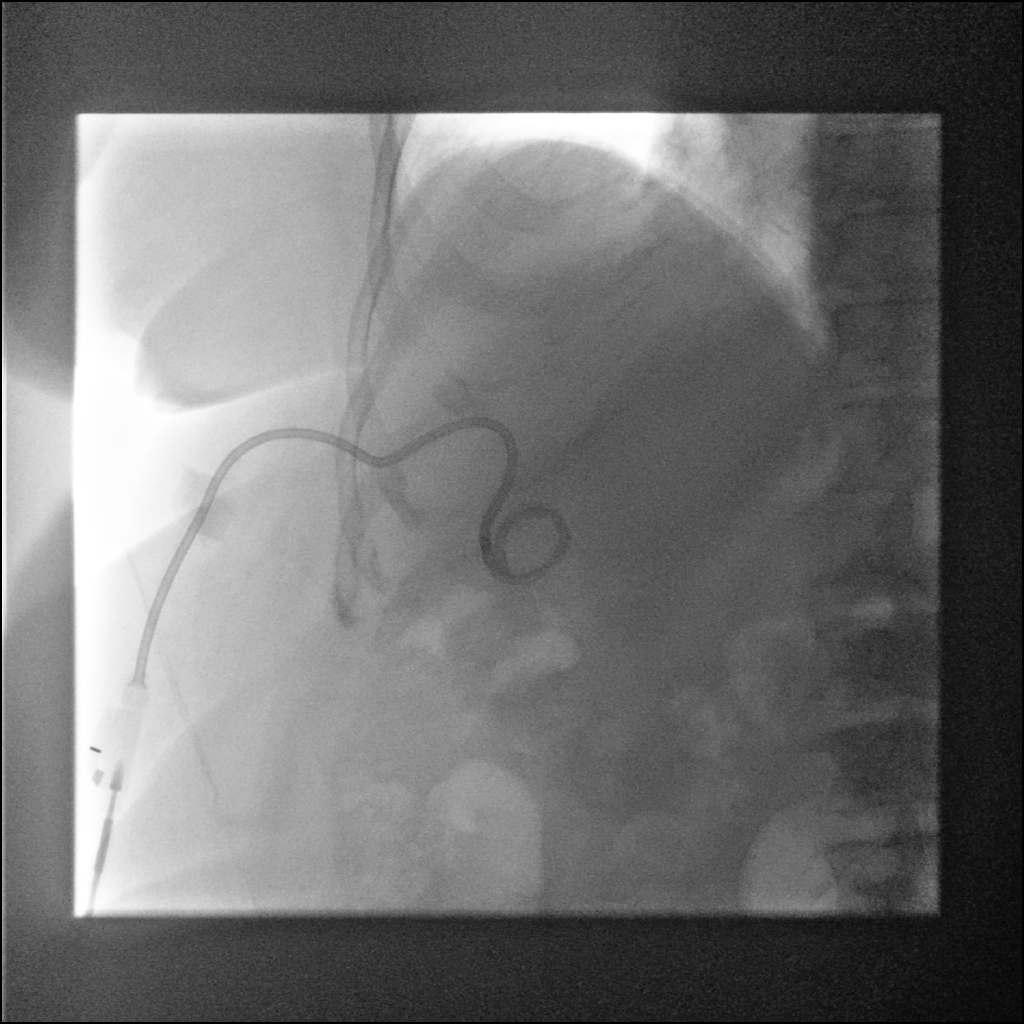

[Series 2: sequence · 4 of 50 frames shown]
[frame 1/50]
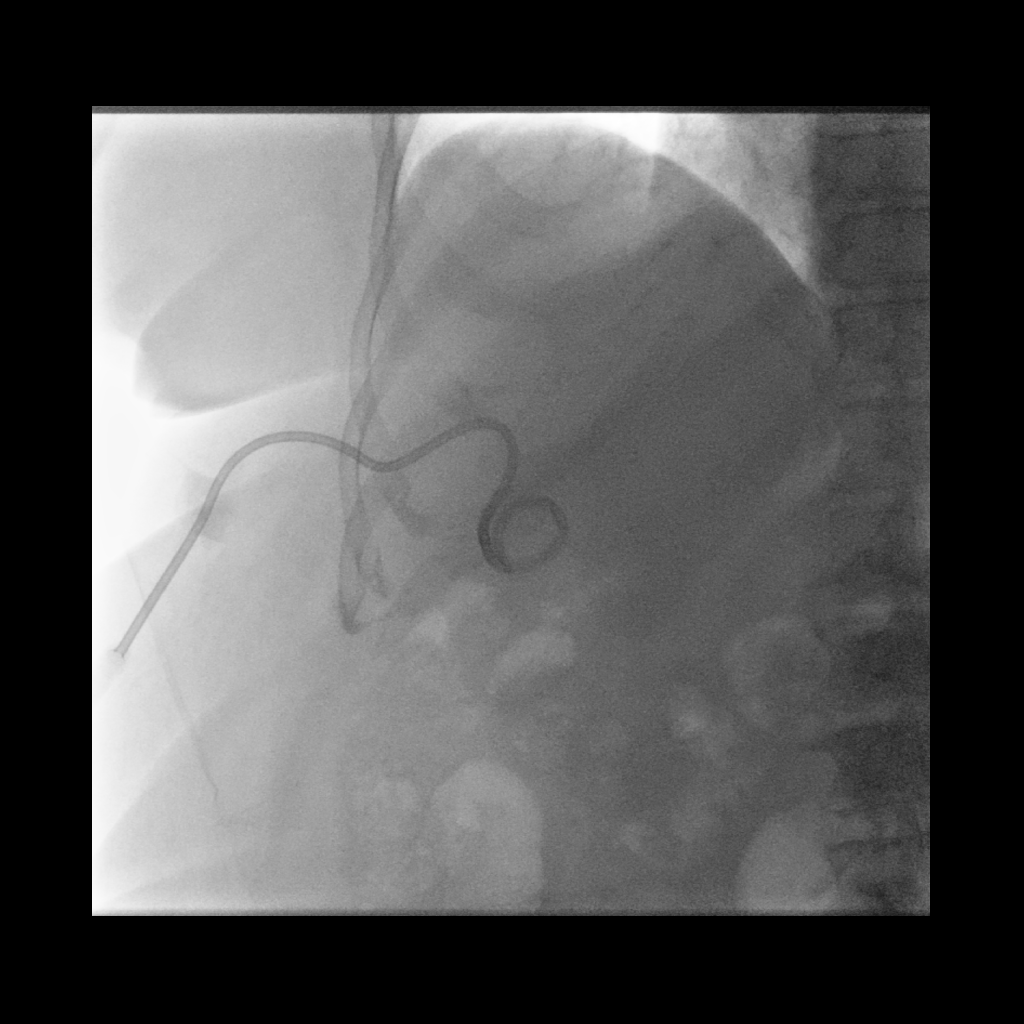
[frame 8/50]
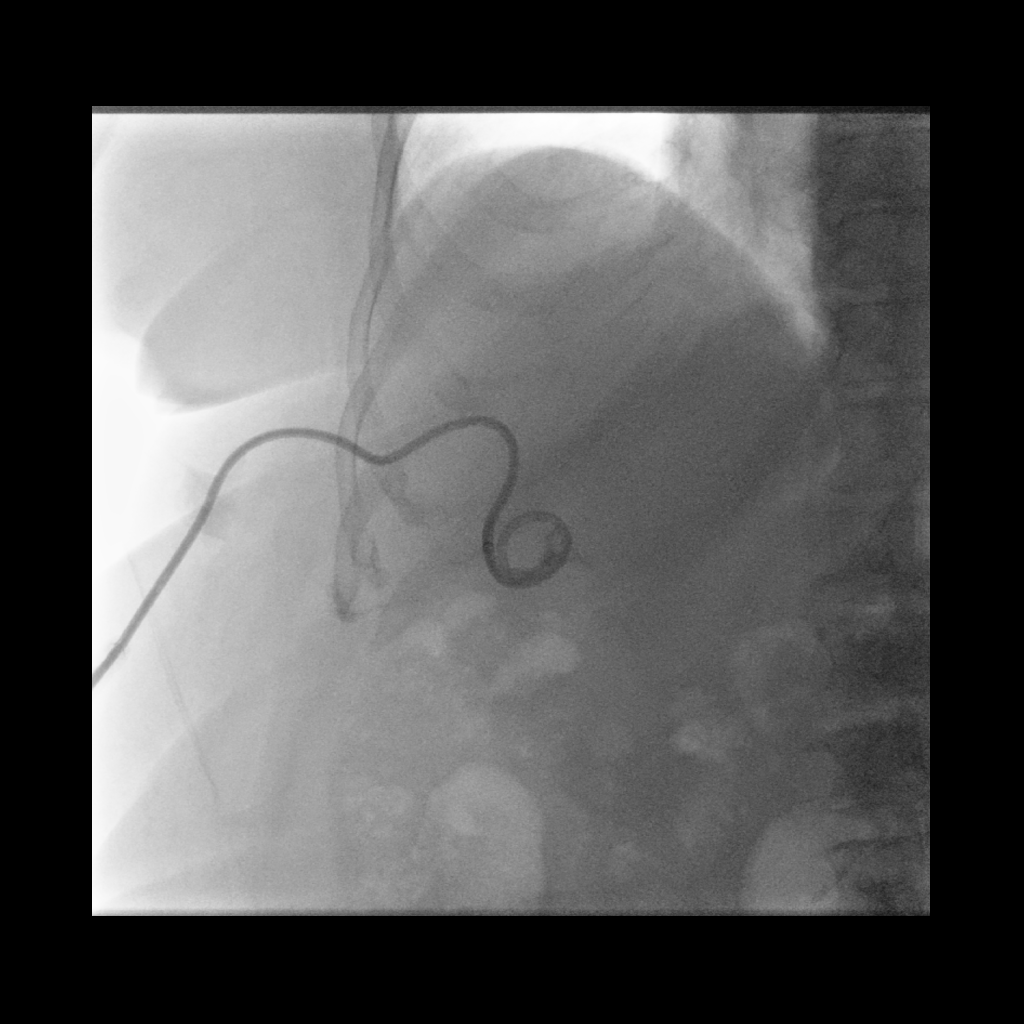
[frame 26/50]
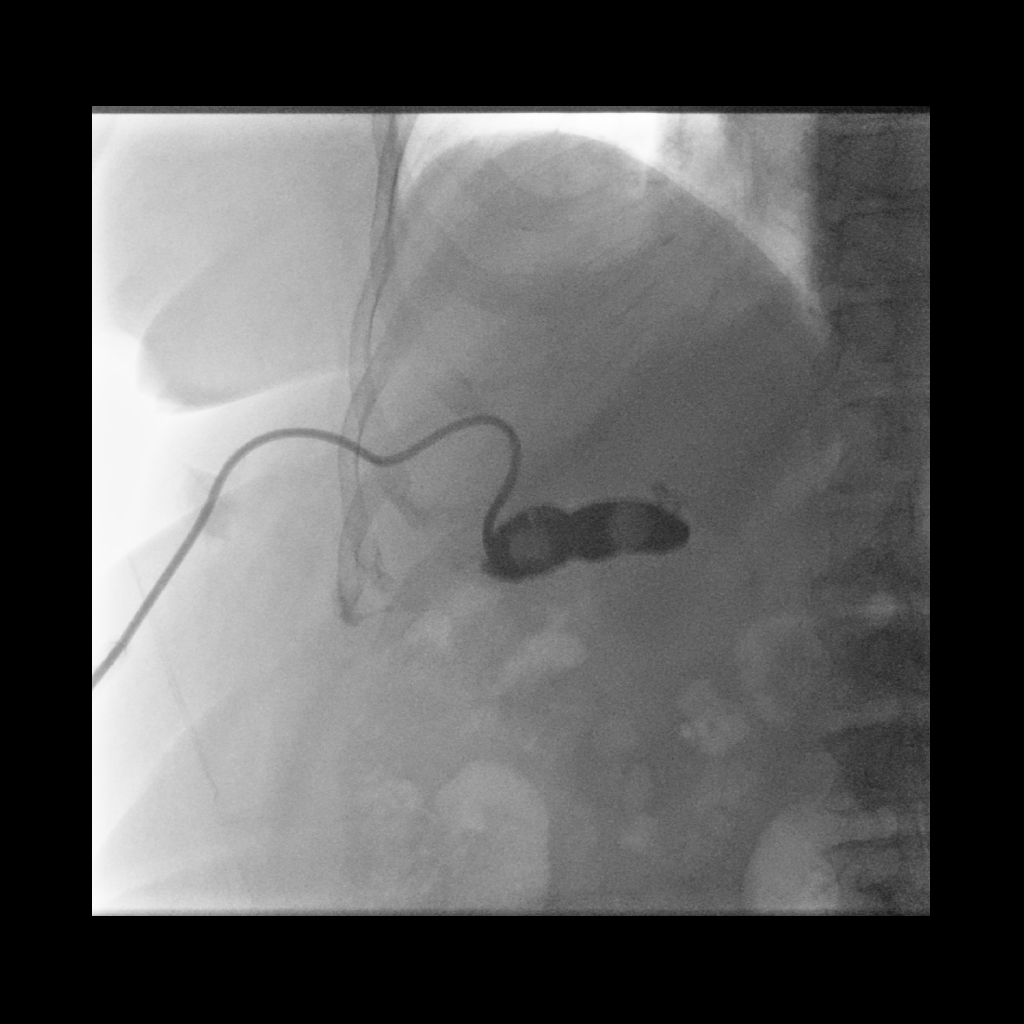
[frame 43/50]
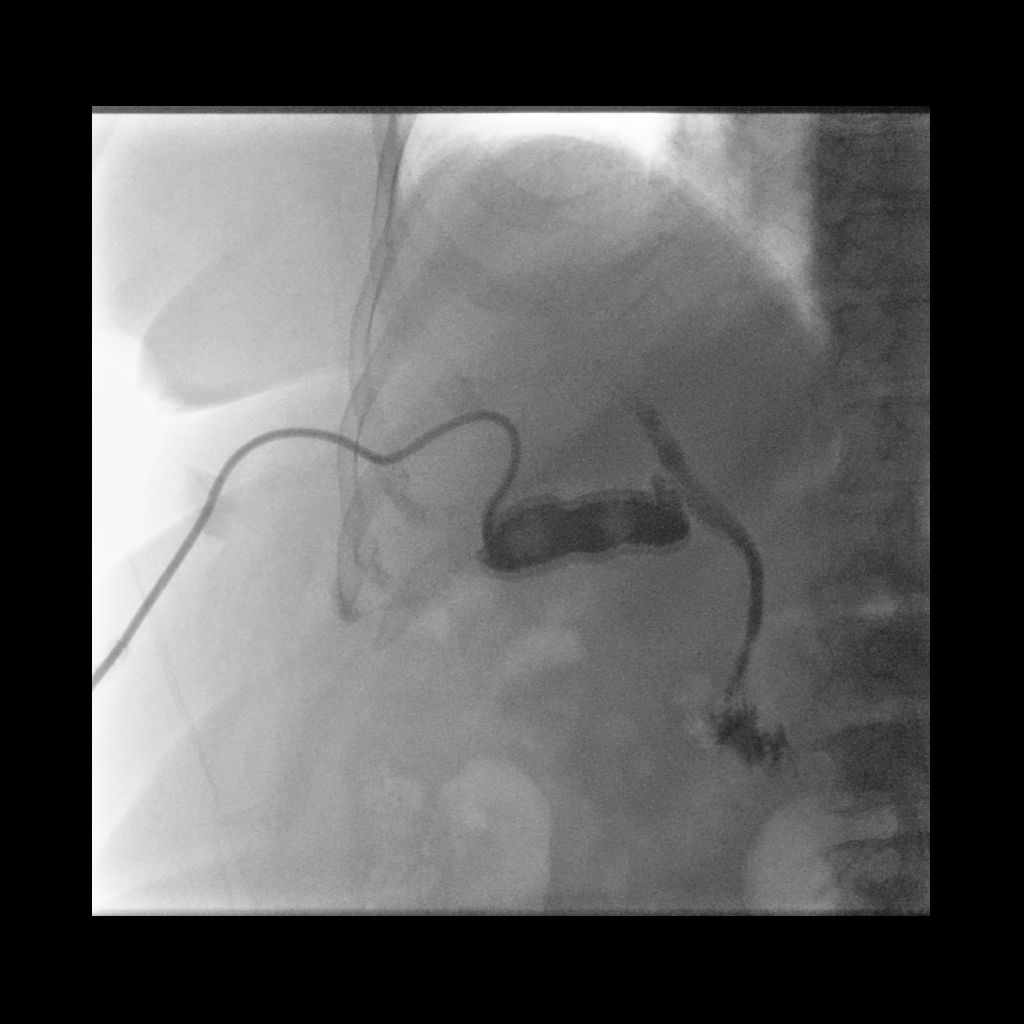

[5 of 5 positions shown; findings below may reference images not displayed]

EXAM:
Cholangiogram through existing tube

MEDICATIONS:
None

ANESTHESIA/SEDATION:
None

FLUOROSCOPY TIME:  Fluoroscopy Time: 0 minutes 18 seconds (17 mGy).

COMPLICATIONS:
None immediate.

PROCEDURE:
A gentle hand injection of contrast material was performed. The
gallbladder is decompressed. The cystic duct, common hepatic duct
and common bile duct are patent. Contrast material flows through the
ampulla and into the duodenum. Mobile filling defects are present
within the gallbladder lumen consistent with cholelithiasis.
IMPRESSION: 1. Restoration of patency of the cystic duct. The common bile duct
is normal.
2. Cholelithiasis.

## 2018-06-09 ENCOUNTER — Other Ambulatory Visit: Payer: Self-pay

## 2018-06-09 ENCOUNTER — Emergency Department (HOSPITAL_COMMUNITY): Payer: Medicare Other

## 2018-06-09 ENCOUNTER — Encounter (HOSPITAL_COMMUNITY): Payer: Self-pay

## 2018-06-09 ENCOUNTER — Inpatient Hospital Stay (HOSPITAL_COMMUNITY)
Admission: EM | Admit: 2018-06-09 | Discharge: 2018-06-14 | DRG: 871 | Disposition: A | Discharge status: Skilled Nursing Facility | Payer: Medicare Other | Attending: Internal Medicine | Admitting: Internal Medicine

## 2018-06-09 DIAGNOSIS — I5032 Chronic diastolic (congestive) heart failure: Secondary | ICD-10-CM | POA: Diagnosis present

## 2018-06-09 DIAGNOSIS — J189 Pneumonia, unspecified organism: Secondary | ICD-10-CM

## 2018-06-09 DIAGNOSIS — Z8249 Family history of ischemic heart disease and other diseases of the circulatory system: Secondary | ICD-10-CM

## 2018-06-09 DIAGNOSIS — I110 Hypertensive heart disease with heart failure: Secondary | ICD-10-CM | POA: Diagnosis present

## 2018-06-09 DIAGNOSIS — E7401 von Gierke disease: Secondary | ICD-10-CM | POA: Diagnosis present

## 2018-06-09 DIAGNOSIS — Z6841 Body Mass Index (BMI) 40.0 and over, adult: Secondary | ICD-10-CM

## 2018-06-09 DIAGNOSIS — Z833 Family history of diabetes mellitus: Secondary | ICD-10-CM

## 2018-06-09 DIAGNOSIS — Z886 Allergy status to analgesic agent status: Secondary | ICD-10-CM

## 2018-06-09 DIAGNOSIS — I639 Cerebral infarction, unspecified: Secondary | ICD-10-CM | POA: Diagnosis present

## 2018-06-09 DIAGNOSIS — Z7902 Long term (current) use of antithrombotics/antiplatelets: Secondary | ICD-10-CM

## 2018-06-09 DIAGNOSIS — E876 Hypokalemia: Secondary | ICD-10-CM | POA: Diagnosis present

## 2018-06-09 DIAGNOSIS — I69354 Hemiplegia and hemiparesis following cerebral infarction affecting left non-dominant side: Secondary | ICD-10-CM

## 2018-06-09 DIAGNOSIS — R05 Cough: Secondary | ICD-10-CM

## 2018-06-09 DIAGNOSIS — D510 Vitamin B12 deficiency anemia due to intrinsic factor deficiency: Secondary | ICD-10-CM | POA: Diagnosis present

## 2018-06-09 DIAGNOSIS — J209 Acute bronchitis, unspecified: Secondary | ICD-10-CM | POA: Diagnosis present

## 2018-06-09 DIAGNOSIS — Z880 Allergy status to penicillin: Secondary | ICD-10-CM

## 2018-06-09 DIAGNOSIS — A419 Sepsis, unspecified organism: Secondary | ICD-10-CM | POA: Diagnosis not present

## 2018-06-09 DIAGNOSIS — M1990 Unspecified osteoarthritis, unspecified site: Secondary | ICD-10-CM | POA: Diagnosis present

## 2018-06-09 DIAGNOSIS — R059 Cough, unspecified: Secondary | ICD-10-CM

## 2018-06-09 DIAGNOSIS — I10 Essential (primary) hypertension: Secondary | ICD-10-CM | POA: Diagnosis present

## 2018-06-09 DIAGNOSIS — Z885 Allergy status to narcotic agent status: Secondary | ICD-10-CM

## 2018-06-09 DIAGNOSIS — Z7401 Bed confinement status: Secondary | ICD-10-CM

## 2018-06-09 DIAGNOSIS — Z87891 Personal history of nicotine dependence: Secondary | ICD-10-CM

## 2018-06-09 DIAGNOSIS — J9601 Acute respiratory failure with hypoxia: Secondary | ICD-10-CM | POA: Diagnosis present

## 2018-06-09 DIAGNOSIS — J181 Lobar pneumonia, unspecified organism: Secondary | ICD-10-CM | POA: Diagnosis present

## 2018-06-09 DIAGNOSIS — Z888 Allergy status to other drugs, medicaments and biological substances status: Secondary | ICD-10-CM

## 2018-06-09 MED ORDER — ACETAMINOPHEN 325 MG PO TABS
650.0000 mg | ORAL_TABLET | Freq: Once | ORAL | Status: AC
  Administered 2018-06-10: 650 mg via ORAL

## 2018-06-09 MED ORDER — ACETAMINOPHEN 325 MG PO TABS
650.0000 mg | ORAL_TABLET | Freq: Once | ORAL | Status: DC
  Filled 2018-06-09: qty 2

## 2018-06-09 MED ORDER — LEVOFLOXACIN IN D5W 750 MG/150ML IV SOLN
750.0000 mg | Freq: Once | INTRAVENOUS | Status: AC
  Administered 2018-06-10: 750 mg via INTRAVENOUS
  Filled 2018-06-09: qty 150

## 2018-06-09 MED ORDER — VANCOMYCIN HCL 10 G IV SOLR
2000.0000 mg | Freq: Once | INTRAVENOUS | Status: DC
  Filled 2018-06-09: qty 2000

## 2018-06-09 MED ORDER — SODIUM CHLORIDE 0.9 % IV SOLN: 2.0000 g | Freq: Once | INTRAVENOUS | Status: DC

## 2018-06-09 NOTE — Progress Notes (Signed)
RT to beside to assess for SOB/wheeze. Breath sounds are mostly clear/diminished with upper airway wheeze heard. Per EMS, no respiratory history or history of heart failure. RT will continue to monitor.

## 2018-06-09 NOTE — ED Provider Notes (Signed)
Medical screening examination/treatment/procedure(s) were conducted as a shared visit with non-physician practitioner(s) and myself.  I personally evaluated the patient during the encounter.  None 84 year old female here with shortness of breath as well as cough and congestion.  Temperature here of 103.5.  He was treated with Tylenol.  Suspect pneumonia.  Will start empiric antibiotics and patient will likely require hospital admission   Lacretia Leigh, MD 06/09/18 2329

## 2018-06-09 NOTE — ED Provider Notes (Signed)
[place] EMERGENCY DEPARTMENT Provider Note   CSN: [id number] Arrival date & time: 06/09/18  2232     History   Chief Complaint Chief Complaint  Patient presents with  . Shortness of Breath    HPI Connie Miranda is a 84 y.o. female.  Patient presents with caregiver who reports cough and "heavy breathing" since yesterday. The patient denies chest pain, abdominal pain, nausea, vomiting or urinary symptoms. Per caregiver, there has been no confusion, fever. Their main concern has been her breathing. The patient is essentially bed bound with history of pernicious anemia, HTN, lymphedema, G6PD deficiency, CVA.   The history is provided by the patient and a caregiver. No language interpreter was used.    Past Medical History:  Diagnosis Date  . Edema   . Edema of both legs 01/2016  . Glucose-6-phosphate dehydrogenase deficiency (HCC)    possible diagnosis of G6PD per patient "phosphate 6 deficiency"  . Hypertension   . Osteoarthritis   . Pernicious anemia   . Stroke [facility]) 09/2000   couldn't move her left foot which resolved    Patient Active Problem List   Diagnosis Date Noted  . Lower extremity weakness 02/11/2016  . Difficulty walking 04/08/2013  . Weakness of both legs 04/08/2013  . Morbid obesity ([place]) 04/08/2013  . Acute renal failure ([place]) 01/28/2013  . Hyperkalemia 01/28/2013  . Fall 01/28/2013  . Nausea 01/28/2013  . Lymphedema 10/16/2012  . Cellulitis 10/16/2012  . Diarrhea 10/13/2012  . Cellulitis of left lower leg 10/12/2012  . Anemia 10/12/2012  . Abdominal pain 10/12/2012  . Hip pain 10/12/2012  . Hypertension   . Edema   . Pernicious anemia   . Glucose-6-phosphate dehydrogenase deficiency ([place])   . Osteoarthritis   . Stroke [facility]) 09/28/2000    Past Surgical History:  Procedure Laterality Date  . BONE MARROW BIOPSY  12/1966   for anemia  . IR PERC CHOLECYSTOSTOMY  04/08/2017  . IR RADIOLOGIST EVAL & MGMT  05/20/2017  . IR  SINUS/FIST TUBE CHK-NON GI  06/11/2017  . MULTIPLE TOOTH EXTRACTIONS    . OTHER SURGICAL HISTORY     cyst from panty line removed as child     OB History   None      Home Medications    Prior to Admission medications   Medication Sig Start Date End Date Taking? Authorizing Provider  acetaminophen (TYLENOL) 325 MG tablet Take 2 tablets (650 mg total) by mouth every 6 (six) hours as needed. Patient taking differently: Take 650 mg by mouth every 6 (six) hours as needed for mild pain or moderate pain.  04/11/13   Dellinger, Bobby Rumpf, PA-C  amLODipine (NORVASC) 10 MG tablet Take 1 tablet (10 mg total) by mouth daily. 03/24/13   Lauree Chandler, NP  Biotin 1000 MCG tablet Take 1 tablet (1 mg total) by mouth daily. 03/24/13   Lauree Chandler, NP  Calcium Carbonate-Vitamin D (CALCIUM + D PO) Take 1 tablet by mouth daily.    [provider]  clopidogrel (PLAVIX) 75 MG tablet Take 1 tablet (75 mg total) by mouth daily. 03/24/13   Lauree Chandler, NP  cyanocobalamin (,VITAMIN B-12,) 1000 MCG/ML injection Inject 1 mL (1,000 mcg total) into the muscle every 30 (thirty) days. 03/24/13   Lauree Chandler, NP  furosemide (LASIX) 40 MG tablet Take 1 tablet (40 mg total) by mouth daily with breakfast. 04/11/13   Dellinger, Bobby Rumpf, PA-C  mineral oil-hydrophilic petrolatum (AQUAPHOR)  ointment Apply 1 application topically daily. To lower extremities 03/24/13   Lauree Chandler, NP  Multiple Vitamin (MULTIVITAMIN WITH MINERALS) TABS Take 1 tablet by mouth daily. 03/24/13   Lauree Chandler, NP  potassium chloride SA (K-DUR,KLOR-CON) 20 MEQ tablet Take 1 tablet (20 mEq total) by mouth daily. 04/11/13   Dellinger, Bobby Rumpf, PA-C  zolpidem (AMBIEN) 5 MG tablet Take 1 tablet (5 mg total) by mouth at bedtime as needed for sleep (insomnia). 04/11/13   Dellinger, Bobby Rumpf, PA-C    Family History Family History  Problem Relation Age of Onset  . Diabetes Brother   . Diabetes Brother   .  Heart failure Mother 69  . Heart attack Father 68    Social History Social History   Tobacco Use  . Smoking status: Former Smoker    Types: Cigarettes    Last attempt to quit: 12/29/1993    Years since quitting: 24.4  . Smokeless tobacco: Never Used  Substance Use Topics  . Alcohol use: No  . Drug use: No     Allergies   Asa [aspirin]; Phosphate; Codeine; and Penicillins   Review of Systems Review of Systems  Constitutional: Negative for chills and fever.  HENT: Negative.  Negative for congestion and sore throat.   Respiratory: Positive for cough and shortness of breath.   Cardiovascular: Negative.  Negative for chest pain.  Gastrointestinal: Negative.  Negative for abdominal pain and nausea.  Genitourinary: Negative.  Negative for difficulty urinating and dysuria.  Musculoskeletal: Negative.  Negative for myalgias.  Skin: Negative.   Neurological: Negative.  Negative for weakness.  Psychiatric/Behavioral: Negative for confusion.     Physical Exam Updated Vital Signs BP (!) 185/59 (BP Location: Right Arm)   Pulse 100   Temp (S) (!) 103.5 F (39.7 C) (Oral)   Resp (!) 35   Ht '5\' 2"'$  (1.575 m)   Wt 113.4 kg (250 lb)   SpO2 99%   BMI 45.73 kg/m   Physical Exam  Constitutional: She is oriented to person, place, and time. She appears well-developed and well-nourished.  Chronically ill appearing.  HENT:  Head: Normocephalic.  Neck: Normal range of motion. Neck supple.  Cardiovascular: Normal rate and regular rhythm.  Pulmonary/Chest: Effort normal and breath sounds normal. She has no wheezes. She has no rhonchi. She has no rales. She exhibits no tenderness.  Poor effort.  Abdominal: Soft. Bowel sounds are normal. There is no tenderness. There is no rebound and no guarding.  Musculoskeletal: Normal range of motion.       Right lower leg: She exhibits no edema.       Left lower leg: She exhibits no edema.  Neurological: She is alert and oriented to person, place,  and time.  Skin: Skin is warm and dry. No rash noted. No erythema.  Psychiatric: She has a normal mood and affect.     ED Treatments / Results  Labs (all labs ordered are listed, but only abnormal results are displayed) Labs Reviewed  CULTURE, BLOOD (ROUTINE X 2)  CULTURE, BLOOD (ROUTINE X 2)  URINE CULTURE  COMPREHENSIVE METABOLIC PANEL  CBC WITH DIFFERENTIAL/PLATELET  PROTIME-INR  URINALYSIS, ROUTINE W REFLEX MICROSCOPIC  I-STAT CG4 LACTIC ACID, ED    EKG None  Radiology Dg Chest Port 1 View  Result Date: 06/09/2018 CLINICAL DATA:  Initial evaluation for acute cough and fever. EXAM: PORTABLE CHEST 1 VIEW COMPARISON:  Prior radiograph from 04/05/2017. FINDINGS: Mild cardiomegaly, stable from previous. Mediastinal silhouette within normal  limits. Lungs mildly hypoinflated. Perihilar vascular congestion without overt pulmonary edema. No focal infiltrates. No pleural effusion. No pneumothorax. No acute osseus abnormality. Advanced degenerative changes about the shoulders. IMPRESSION: 1. Mild cardiomegaly with perihilar vascular congestion without overt pulmonary edema. 2. No other active cardiopulmonary disease. No focal infiltrates identified. Electronically Signed   By: Jeannine Boga M.D.   On: 06/09/2018 23:38    Procedures Procedures (including critical care time)  Medications Ordered in ED Medications  levofloxacin (LEVAQUIN) IVPB 750 mg (has no administration in time range)  acetaminophen (TYLENOL) tablet 650 mg (has no administration in time range)     Initial Impression / Assessment and Plan / ED Course  I have reviewed the triage vital signs and the nursing notes.  Pertinent labs & imaging results that were available during my care of the patient were reviewed by me and considered in my medical decision making (see chart for details).     Patient presents with caregiver with concern for labored breathing since today. They were not aware of fever that was  103.5 on arrival. She has borderline tachycardia and tachpnea. Sepsis work up was initiated. She is actively coughing during exam.   Presume sepsis due to pneumonia based on presentation. CXR shows congestion but no focal infiltrates. Consider congestion may obscure infection or early pneumonia. IV levaquin started for CAP. Cultures drawn and pending.   Recheck: the patient is feeling a little better than on arrival. She continues to be alert and oriented and in NAD.   Discussed with the hospitalist, Dr. Blaine Hamper, who accepts for admission for further IV antibiotics.   Final Clinical Impressions(s) / ED Diagnoses   Final diagnoses:  None   1. CAP 2. Sepsis   ED Discharge Orders    None       Charlann Lange, Hershal Coria 06/10/18 0450    Lacretia Leigh, MD 06/10/18 [phone]

## 2018-06-09 NOTE — ED Triage Notes (Signed)
Pt BIB [facility] for eval of worsening SOB. Pt is poor historian and reports feeling this way for "a while". Pt endorses SOB that got acutely worse today. Pt is extremely warm to touch on arrival, audible wheezing w/ dyspnea, productive cough. 100% on 4L DISH.  Pt is A&Ox4.

## 2018-06-10 DIAGNOSIS — I5032 Chronic diastolic (congestive) heart failure: Secondary | ICD-10-CM

## 2018-06-10 DIAGNOSIS — D510 Vitamin B12 deficiency anemia due to intrinsic factor deficiency: Secondary | ICD-10-CM | POA: Diagnosis not present

## 2018-06-10 DIAGNOSIS — Z833 Family history of diabetes mellitus: Secondary | ICD-10-CM | POA: Diagnosis not present

## 2018-06-10 DIAGNOSIS — M1990 Unspecified osteoarthritis, unspecified site: Secondary | ICD-10-CM | POA: Diagnosis present

## 2018-06-10 DIAGNOSIS — I110 Hypertensive heart disease with heart failure: Secondary | ICD-10-CM | POA: Diagnosis present

## 2018-06-10 DIAGNOSIS — E7401 von Gierke disease: Secondary | ICD-10-CM | POA: Diagnosis present

## 2018-06-10 DIAGNOSIS — J181 Lobar pneumonia, unspecified organism: Secondary | ICD-10-CM | POA: Diagnosis present

## 2018-06-10 DIAGNOSIS — Z885 Allergy status to narcotic agent status: Secondary | ICD-10-CM | POA: Diagnosis not present

## 2018-06-10 DIAGNOSIS — E876 Hypokalemia: Secondary | ICD-10-CM | POA: Diagnosis present

## 2018-06-10 DIAGNOSIS — R0989 Other specified symptoms and signs involving the circulatory and respiratory systems: Secondary | ICD-10-CM | POA: Diagnosis not present

## 2018-06-10 DIAGNOSIS — I69354 Hemiplegia and hemiparesis following cerebral infarction affecting left non-dominant side: Secondary | ICD-10-CM | POA: Diagnosis not present

## 2018-06-10 DIAGNOSIS — Z7902 Long term (current) use of antithrombotics/antiplatelets: Secondary | ICD-10-CM | POA: Diagnosis not present

## 2018-06-10 DIAGNOSIS — Z6841 Body Mass Index (BMI) 40.0 and over, adult: Secondary | ICD-10-CM | POA: Diagnosis not present

## 2018-06-10 DIAGNOSIS — J9601 Acute respiratory failure with hypoxia: Secondary | ICD-10-CM | POA: Diagnosis not present

## 2018-06-10 DIAGNOSIS — I509 Heart failure, unspecified: Secondary | ICD-10-CM | POA: Diagnosis not present

## 2018-06-10 DIAGNOSIS — Z87891 Personal history of nicotine dependence: Secondary | ICD-10-CM | POA: Diagnosis not present

## 2018-06-10 DIAGNOSIS — B951 Streptococcus, group B, as the cause of diseases classified elsewhere: Secondary | ICD-10-CM | POA: Diagnosis not present

## 2018-06-10 DIAGNOSIS — A419 Sepsis, unspecified organism: Secondary | ICD-10-CM | POA: Diagnosis present

## 2018-06-10 DIAGNOSIS — D550 Anemia due to glucose-6-phosphate dehydrogenase [G6PD] deficiency: Secondary | ICD-10-CM | POA: Diagnosis not present

## 2018-06-10 DIAGNOSIS — B957 Other staphylococcus as the cause of diseases classified elsewhere: Secondary | ICD-10-CM | POA: Diagnosis not present

## 2018-06-10 DIAGNOSIS — E669 Obesity, unspecified: Secondary | ICD-10-CM | POA: Diagnosis not present

## 2018-06-10 DIAGNOSIS — Z7401 Bed confinement status: Secondary | ICD-10-CM | POA: Diagnosis not present

## 2018-06-10 DIAGNOSIS — Z8249 Family history of ischemic heart disease and other diseases of the circulatory system: Secondary | ICD-10-CM | POA: Diagnosis not present

## 2018-06-10 DIAGNOSIS — R609 Edema, unspecified: Secondary | ICD-10-CM | POA: Diagnosis not present

## 2018-06-10 DIAGNOSIS — J209 Acute bronchitis, unspecified: Secondary | ICD-10-CM | POA: Diagnosis present

## 2018-06-10 DIAGNOSIS — Z880 Allergy status to penicillin: Secondary | ICD-10-CM | POA: Diagnosis not present

## 2018-06-10 DIAGNOSIS — Z886 Allergy status to analgesic agent status: Secondary | ICD-10-CM | POA: Diagnosis not present

## 2018-06-10 DIAGNOSIS — R05 Cough: Secondary | ICD-10-CM | POA: Diagnosis present

## 2018-06-10 DIAGNOSIS — I10 Essential (primary) hypertension: Secondary | ICD-10-CM | POA: Diagnosis not present

## 2018-06-10 DIAGNOSIS — R7881 Bacteremia: Secondary | ICD-10-CM | POA: Diagnosis not present

## 2018-06-10 DIAGNOSIS — Z888 Allergy status to other drugs, medicaments and biological substances status: Secondary | ICD-10-CM | POA: Diagnosis not present

## 2018-06-10 LAB — COMPREHENSIVE METABOLIC PANEL
ALK PHOS: 81 U/L (ref 38–126)
ALT: 15 U/L (ref 14–54)
AST: 27 U/L (ref 15–41)
Albumin: 2.8 g/dL — ABNORMAL LOW (ref 3.5–5.0)
Anion gap: 10 (ref 5–15)
BUN: 12 mg/dL (ref 6–20)
CALCIUM: 9.3 mg/dL (ref 8.9–10.3)
CO2: 25 mmol/L (ref 22–32)
CREATININE: 1.06 mg/dL — AB (ref 0.44–1.00)
Chloride: 104 mmol/L (ref 101–111)
GFR calc Af Amer: 55 mL/min — ABNORMAL LOW (ref >60)
GFR, EST NON AFRICAN AMERICAN: 48 mL/min — AB (ref >60)
Glucose, Bld: 128 mg/dL — ABNORMAL HIGH (ref 65–99)
POTASSIUM: 3.6 mmol/L (ref 3.5–5.1)
Sodium: 139 mmol/L (ref 135–145)
TOTAL PROTEIN: 8.5 g/dL — AB (ref 6.5–8.1)
Total Bilirubin: 1.2 mg/dL (ref 0.3–1.2)

## 2018-06-10 LAB — URINALYSIS, ROUTINE W REFLEX MICROSCOPIC
GLUCOSE, UA: NEGATIVE mg/dL
Ketones, ur: 5 mg/dL — AB
LEUKOCYTES UA: NEGATIVE
NITRITE: NEGATIVE
PROTEIN: 100 mg/dL — AB
SPECIFIC GRAVITY, URINE: 1.021 (ref 1.005–1.030)
pH: 5 (ref 5.0–8.0)

## 2018-06-10 LAB — CBC WITH DIFFERENTIAL/PLATELET
Abs Immature Granulocytes: 0.1 10*3/uL (ref 0.0–0.1)
BASOS ABS: 0.1 10*3/uL (ref 0.0–0.1)
BASOS PCT: 0 %
EOS PCT: 1 %
Eosinophils Absolute: 0.1 10*3/uL (ref 0.0–0.7)
HCT: 37.9 % (ref 36.0–46.0)
Hemoglobin: 11.6 g/dL — ABNORMAL LOW (ref 12.0–15.0)
Immature Granulocytes: 1 %
Lymphocytes Relative: 5 %
Lymphs Abs: 0.7 10*3/uL (ref 0.7–4.0)
MCH: 30.1 pg (ref 26.0–34.0)
MCHC: 30.6 g/dL (ref 30.0–36.0)
MCV: 98.4 fL (ref 78.0–100.0)
MONO ABS: 0.6 10*3/uL (ref 0.1–1.0)
Monocytes Relative: 5 %
NEUTROS ABS: 12.6 10*3/uL — AB (ref 1.7–7.7)
Neutrophils Relative %: 88 %
PLATELETS: 311 10*3/uL (ref 150–400)
RBC: 3.85 MIL/uL — ABNORMAL LOW (ref 3.87–5.11)
RDW: 13.2 % (ref 11.5–15.5)
WBC: 14.1 10*3/uL — ABNORMAL HIGH (ref 4.0–10.5)

## 2018-06-10 LAB — BLOOD CULTURE ID PANEL (REFLEXED)
ACINETOBACTER BAUMANNII: NOT DETECTED
CANDIDA PARAPSILOSIS: NOT DETECTED
Candida albicans: NOT DETECTED
Candida glabrata: NOT DETECTED
Candida krusei: NOT DETECTED
Candida tropicalis: NOT DETECTED
Enterobacter cloacae complex: NOT DETECTED
Enterobacteriaceae species: NOT DETECTED
Enterococcus species: NOT DETECTED
Escherichia coli: NOT DETECTED
HAEMOPHILUS INFLUENZAE: NOT DETECTED
KLEBSIELLA OXYTOCA: NOT DETECTED
Klebsiella pneumoniae: NOT DETECTED
Listeria monocytogenes: NOT DETECTED
METHICILLIN RESISTANCE: NOT DETECTED
Neisseria meningitidis: NOT DETECTED
Proteus species: NOT DETECTED
Pseudomonas aeruginosa: NOT DETECTED
STREPTOCOCCUS PNEUMONIAE: NOT DETECTED
STREPTOCOCCUS SPECIES: DETECTED — AB
Serratia marcescens: NOT DETECTED
Staphylococcus aureus (BCID): NOT DETECTED
Staphylococcus species: DETECTED — AB
Streptococcus agalactiae: DETECTED — AB
Streptococcus pyogenes: NOT DETECTED

## 2018-06-10 LAB — RESPIRATORY PANEL BY PCR
Adenovirus: NOT DETECTED
BORDETELLA PERTUSSIS-RVPCR: NOT DETECTED
CORONAVIRUS OC43-RVPPCR: NOT DETECTED
Chlamydophila pneumoniae: NOT DETECTED
Coronavirus 229E: NOT DETECTED
Coronavirus HKU1: NOT DETECTED
Coronavirus NL63: NOT DETECTED
INFLUENZA A-RVPPCR: NOT DETECTED
INFLUENZA B-RVPPCR: NOT DETECTED
Metapneumovirus: NOT DETECTED
Mycoplasma pneumoniae: NOT DETECTED
PARAINFLUENZA VIRUS 1-RVPPCR: NOT DETECTED
PARAINFLUENZA VIRUS 2-RVPPCR: NOT DETECTED
PARAINFLUENZA VIRUS 4-RVPPCR: NOT DETECTED
Parainfluenza Virus 3: NOT DETECTED
RESPIRATORY SYNCYTIAL VIRUS-RVPPCR: NOT DETECTED
Rhinovirus / Enterovirus: NOT DETECTED

## 2018-06-10 LAB — I-STAT CG4 LACTIC ACID, ED
LACTIC ACID, VENOUS: 2.68 mmol/L — AB (ref 0.5–1.9)
Lactic Acid, Venous: 0.93 mmol/L (ref 0.5–1.9)

## 2018-06-10 LAB — PROCALCITONIN: Procalcitonin: 1.48 ng/mL

## 2018-06-10 LAB — PROTIME-INR
INR: 1.12
PROTHROMBIN TIME: 14.3 s (ref 11.4–15.2)

## 2018-06-10 LAB — LACTIC ACID, PLASMA: Lactic Acid, Venous: 0.9 mmol/L (ref 0.5–1.9)

## 2018-06-10 LAB — STREP PNEUMONIAE URINARY ANTIGEN: Strep Pneumo Urinary Antigen: NEGATIVE

## 2018-06-10 LAB — BRAIN NATRIURETIC PEPTIDE: B NATRIURETIC PEPTIDE 5: 124.7 pg/mL — AB (ref 0.0–100.0)

## 2018-06-10 LAB — MAGNESIUM: Magnesium: 1.7 mg/dL (ref 1.7–2.4)

## 2018-06-10 MED ORDER — ALBUTEROL SULFATE (2.5 MG/3ML) 0.083% IN NEBU: 2.5000 mg | INHALATION_SOLUTION | RESPIRATORY_TRACT | Status: DC | PRN

## 2018-06-10 MED ORDER — CEFAZOLIN SODIUM-DEXTROSE 2-4 GM/100ML-% IV SOLN
2.0000 g | Freq: Three times a day (TID) | INTRAVENOUS | Status: DC
Start: 2018-06-10 — End: 2018-06-11
  Administered 2018-06-10 – 2018-06-11 (×3): 2 g via INTRAVENOUS
  Filled 2018-06-10 (×5): qty 100

## 2018-06-10 MED ORDER — GUAIFENESIN ER 600 MG PO TB12
600.0000 mg | ORAL_TABLET | Freq: Two times a day (BID) | ORAL | Status: DC
  Administered 2018-06-10 – 2018-06-14 (×9): 600 mg via ORAL
  Filled 2018-06-10 (×9): qty 1

## 2018-06-10 MED ORDER — ADULT MULTIVITAMIN W/MINERALS CH
1.0000 | ORAL_TABLET | Freq: Every day | ORAL | Status: DC
  Administered 2018-06-10 – 2018-06-12 (×3): 1 via ORAL
  Filled 2018-06-10 (×4): qty 1

## 2018-06-10 MED ORDER — POTASSIUM CHLORIDE 20 MEQ/15ML (10%) PO SOLN
40.0000 meq | Freq: Once | ORAL | Status: AC
  Administered 2018-06-10: 40 meq via ORAL
  Filled 2018-06-10: qty 30

## 2018-06-10 MED ORDER — LEVOFLOXACIN IN D5W 750 MG/150ML IV SOLN: 750.0000 mg | INTRAVENOUS | Status: DC

## 2018-06-10 MED ORDER — ENOXAPARIN SODIUM 40 MG/0.4ML ~~LOC~~ SOLN
40.0000 mg | Freq: Every day | SUBCUTANEOUS | Status: DC
  Administered 2018-06-10 – 2018-06-14 (×5): 40 mg via SUBCUTANEOUS
  Filled 2018-06-10 (×5): qty 0.4

## 2018-06-10 MED ORDER — ONDANSETRON HCL 4 MG/2ML IJ SOLN: 4.0000 mg | Freq: Three times a day (TID) | INTRAMUSCULAR | Status: DC | PRN

## 2018-06-10 MED ORDER — BIOTIN 1000 MCG PO TABS: 1000.0000 ug | ORAL_TABLET | Freq: Every day | ORAL | Status: DC

## 2018-06-10 MED ORDER — CYANOCOBALAMIN 1000 MCG/ML IJ SOLN: 1000.0000 ug | INTRAMUSCULAR | Status: DC

## 2018-06-10 MED ORDER — HYDRALAZINE HCL 20 MG/ML IJ SOLN: 5.0000 mg | INTRAMUSCULAR | Status: DC | PRN

## 2018-06-10 MED ORDER — ZOLPIDEM TARTRATE 5 MG PO TABS: 5.0000 mg | ORAL_TABLET | Freq: Every evening | ORAL | Status: DC | PRN

## 2018-06-10 MED ORDER — CLOPIDOGREL BISULFATE 75 MG PO TABS
75.0000 mg | ORAL_TABLET | Freq: Every day | ORAL | Status: DC
  Administered 2018-06-10 – 2018-06-14 (×5): 75 mg via ORAL
  Filled 2018-06-10 (×5): qty 1

## 2018-06-10 MED ORDER — AQUAPHOR EX OINT
1.0000 "application " | TOPICAL_OINTMENT | Freq: Every day | CUTANEOUS | Status: DC
  Administered 2018-06-13: 1 via TOPICAL
  Filled 2018-06-10: qty 50

## 2018-06-10 MED ORDER — CALCIUM CARBONATE-VITAMIN D 500-200 MG-UNIT PO TABS
1.0000 | ORAL_TABLET | Freq: Every day | ORAL | Status: DC
  Administered 2018-06-10 – 2018-06-14 (×5): 1 via ORAL
  Filled 2018-06-10 (×5): qty 1

## 2018-06-10 MED ORDER — DM-GUAIFENESIN ER 30-600 MG PO TB12: 1.0000 | ORAL_TABLET | Freq: Two times a day (BID) | ORAL | Status: DC | PRN

## 2018-06-10 MED ORDER — ACETAMINOPHEN 325 MG PO TABS
650.0000 mg | ORAL_TABLET | Freq: Four times a day (QID) | ORAL | Status: DC | PRN
  Administered 2018-06-12: 650 mg via ORAL
  Filled 2018-06-10: qty 2

## 2018-06-10 NOTE — H&P (Signed)
History and Physical    Connie Miranda:[id number] DOB: 1933/03/25 DOA: 06/09/2018  Referring MD/NP/PA:   PCP: [name], MD   Patient coming from:  The patient is coming from home.  At baseline, pt is independent for most of ADL.  Chief Complaint: cough, fever and chills  HPI: Connie Miranda is a 84 y.o. female with medical history significant of hypertension, stroke, G6PD deficiency, pernicious anemia, edema, CHF, morbid obesity, who presents with cough, fever and chills.  Patient states that she has been having cough in the past 2 or 3 days.  She has little mucus production.  Denies shortness of breath to me, but per EMS report, she endorsed SOB. She also has fever and chills.  She denies chest pain, runny nose or sore throat.  She states she has nausea and nausea and vomited once, but no abdominal pain or diarrhea.  Denies symptoms of UTI or unilateral weakness.  EMS report, pt had wheezing earlier.  ED Course: pt was found to have WBC 14.1, lactic acid 2.60, 0.93, negative urinalysis, potassium 3.0, creatinine 1.06, temperature 103.5, no tachycardia, has tachypnea, oxygen saturation 90% on room air, which improved to 100% on 3 to 4 L nasal cannula oxygen.  Patient is admitted to telemetry bed as inpatient.  Chest x-ray showed 1. Mild cardiomegaly with perihilar vascular congestion without overt pulmonary edema. 2. No other active cardiopulmonary disease. No focal infiltrates identified.  Review of Systems:   General: has fevers, chills, no body weight gain, has fatigue HEENT: no blurry vision, hearing changes or sore throat Respiratory: has dyspnea, coughing, wheezing CV: no chest pain, no palpitations GI: has nausea, vomiting, no abdominal pain, diarrhea, constipation GU: no dysuria, burning on urination, increased urinary frequency, hematuria  Ext: has leg edema Neuro: no unilateral weakness, numbness, or tingling, no vision change or hearing loss Skin: no rash, no  skin tear. MSK: No muscle spasm, no deformity, no limitation of range of movement in spin Heme: No easy bruising.  Travel history: No recent long distant travel.  Allergy:  Allergies  Allergen Reactions  . Asa [Aspirin] Other (See Comments)    Doctor told her not to take due to type anemia  . Phosphate Other (See Comments)    Anything containing phosphate 6. Unknown reaction  . Codeine Other (See Comments)    Made her too sleepy  . Penicillins Itching and Swelling    Tolerates keflex    Past Medical History:  Diagnosis Date  . Edema   . Edema of both legs 01/2016  . Glucose-6-phosphate dehydrogenase deficiency (HCC)    possible diagnosis of G6PD per patient "phosphate 6 deficiency"  . Hypertension   . Osteoarthritis   . Pernicious anemia   . Stroke [facility]) 09/2000   couldn't move her left foot which resolved    Past Surgical History:  Procedure Laterality Date  . BONE MARROW BIOPSY  12/1966   for anemia  . IR PERC CHOLECYSTOSTOMY  04/08/2017  . IR RADIOLOGIST EVAL & MGMT  05/20/2017  . IR SINUS/FIST TUBE CHK-NON GI  06/11/2017  . MULTIPLE TOOTH EXTRACTIONS    . OTHER SURGICAL HISTORY     cyst from panty line removed as child    Social History:  reports that she quit smoking about 24 years ago. Her smoking use included cigarettes. She has never used smokeless tobacco. She reports that she does not drink alcohol or use drugs.  Family History:  Family History  Problem Relation Age of  Onset  . Diabetes Brother   . Diabetes Brother   . Heart failure Mother 14  . Heart attack Father 69     Prior to Admission medications   Medication Sig Start Date End Date Taking? Authorizing Provider  acetaminophen (TYLENOL) 325 MG tablet Take 2 tablets (650 mg total) by mouth every 6 (six) hours as needed. Patient taking differently: Take 650 mg by mouth every 6 (six) hours as needed for mild pain or moderate pain.  04/11/13   Dellinger, Bobby Rumpf, PA-C  amLODipine (NORVASC) 10 MG  tablet Take 1 tablet (10 mg total) by mouth daily. 03/24/13   Lauree Chandler, NP  Biotin 1000 MCG tablet Take 1 tablet (1 mg total) by mouth daily. 03/24/13   Lauree Chandler, NP  Calcium Carbonate-Vitamin D (CALCIUM + D PO) Take 1 tablet by mouth daily.    [provider]  clopidogrel (PLAVIX) 75 MG tablet Take 1 tablet (75 mg total) by mouth daily. 03/24/13   Lauree Chandler, NP  cyanocobalamin (,VITAMIN B-12,) 1000 MCG/ML injection Inject 1 mL (1,000 mcg total) into the muscle every 30 (thirty) days. 03/24/13   Lauree Chandler, NP  furosemide (LASIX) 40 MG tablet Take 1 tablet (40 mg total) by mouth daily with breakfast. 04/11/13   Dellinger, Bobby Rumpf, PA-C  mineral oil-hydrophilic petrolatum (AQUAPHOR) ointment Apply 1 application topically daily. To lower extremities 03/24/13   Lauree Chandler, NP  Multiple Vitamin (MULTIVITAMIN WITH MINERALS) TABS Take 1 tablet by mouth daily. 03/24/13   Lauree Chandler, NP  potassium chloride SA (K-DUR,KLOR-CON) 20 MEQ tablet Take 1 tablet (20 mEq total) by mouth daily. 04/11/13   Dellinger, Bobby Rumpf, PA-C  zolpidem (AMBIEN) 5 MG tablet Take 1 tablet (5 mg total) by mouth at bedtime as needed for sleep (insomnia). 04/11/13   Dellinger, Bobby Rumpf, PA-C    Physical Exam: Vitals:   06/10/18 0045 06/10/18 0115 06/10/18 0145 06/10/18 0215  BP: (!) 149/59 (!) 150/49 (!) 137/46 (!) 132/48  Pulse: 89 88 85 85  Resp: (!) 21 (!) 24 (!) 23 20  Temp:      TempSrc:      SpO2: 100% 100% 100% 100%  Weight:      Height:       General: Not in acute distress HEENT:       Eyes: PERRL, EOMI, no scleral icterus.       ENT: No discharge from the ears and nose, no pharynx injection, no tonsillar enlargement.        Neck: No JVD, no bruit, no mass felt. Heme: No neck lymph node enlargement. Cardiac: S1/S2, RRR, No murmurs, No gallops or rubs. Respiratory: has decreasedair movement bilaterally. Has scattered crackles bilaterally. Currently no  wheezing (patient had wheezing earlier) GI: Soft, nondistended, nontender, no rebound pain, no organomegaly, BS present. GU: No hematuria Ext: has 1+ pitting leg edema and lymphedema bilaterally. 2+DP/PT pulse bilaterally. Musculoskeletal: No joint deformities, No joint redness or warmth, no limitation of ROM in spin. Skin: No rashes.  Neuro: Alert, oriented X3, cranial nerves II-XII grossly intact, moves all extremities normally.   Labs on Admission: I have personally reviewed following labs and imaging studies  CBC: Recent Labs  Lab 06/09/18 2323  WBC 14.1*  NEUTROABS 12.6*  HGB 11.6*  HCT 37.9  MCV 98.4  PLT 462   Basic Metabolic Panel: Recent Labs  Lab 06/09/18 2323 06/10/18 0500  NA 139  --   K 3.6  --  CL 104  --   CO2 25  --   GLUCOSE 128*  --   BUN 12  --   CREATININE 1.06*  --   CALCIUM 9.3  --   MG  --  1.7   GFR: Estimated Creatinine Clearance: 48.7 mL/min (A) (by C-G formula based on SCr of 1.06 mg/dL (H)). Liver Function Tests: Recent Labs  Lab 06/09/18 2323  AST 27  ALT 15  ALKPHOS 81  BILITOT 1.2  PROT 8.5*  ALBUMIN 2.8*   No results for input(s): LIPASE, AMYLASE in the last 168 hours. No results for input(s): AMMONIA in the last 168 hours. Coagulation Profile: Recent Labs  Lab 06/09/18 2323  INR 1.12   Cardiac Enzymes: No results for input(s): CKTOTAL, CKMB, CKMBINDEX, TROPONINI in the last 168 hours. BNP (last 3 results) No results for input(s): PROBNP in the last 8760 hours. HbA1C: No results for input(s): HGBA1C in the last 72 hours. CBG: No results for input(s): GLUCAP in the last 168 hours. Lipid Profile: No results for input(s): CHOL, HDL, LDLCALC, TRIG, CHOLHDL, LDLDIRECT in the last 72 hours. Thyroid Function Tests: No results for input(s): TSH, T4TOTAL, FREET4, T3FREE, THYROIDAB in the last 72 hours. Anemia Panel: No results for input(s): VITAMINB12, FOLATE, FERRITIN, TIBC, IRON, RETICCTPCT in the last 72 hours. Urine  analysis:    Component Value Date/Time   COLORURINE AMBER (A) 06/09/2018 2348   APPEARANCEUR CLOUDY (A) 06/09/2018 2348   LABSPEC 1.021 06/09/2018 2348   PHURINE 5.0 06/09/2018 2348   GLUCOSEU NEGATIVE 06/09/2018 2348   HGBUR MODERATE (A) 06/09/2018 2348   BILIRUBINUR SMALL (A) 06/09/2018 2348   KETONESUR 5 (A) 06/09/2018 2348   PROTEINUR 100 (A) 06/09/2018 2348   UROBILINOGEN 1.0 04/09/2013 0151   NITRITE NEGATIVE 06/09/2018 2348   LEUKOCYTESUR NEGATIVE 06/09/2018 2348   Sepsis Labs: _0 (procalcitonin:4,lacticidven:4) )No results found for this or any previous visit (from the past 240 hour(s)).   Radiological Exams on Admission: Dg Chest Port 1 View  Result Date: 06/09/2018 CLINICAL DATA:  Initial evaluation for acute cough and fever. EXAM: PORTABLE CHEST 1 VIEW COMPARISON:  Prior radiograph from 04/05/2017. FINDINGS: Mild cardiomegaly, stable from previous. Mediastinal silhouette within normal limits. Lungs mildly hypoinflated. Perihilar vascular congestion without overt pulmonary edema. No focal infiltrates. No pleural effusion. No pneumothorax. No acute osseus abnormality. Advanced degenerative changes about the shoulders. IMPRESSION: 1. Mild cardiomegaly with perihilar vascular congestion without overt pulmonary edema. 2. No other active cardiopulmonary disease. No focal infiltrates identified. Electronically Signed   By: Jeannine Boga M.D.   On: 06/09/2018 23:38     EKG: Not done in ED, will get one.   Assessment/Plan Principal Problem:   Acute respiratory failure with hypoxia (HCC) Active Problems:   Hypertension   Stroke (HCC)   Pernicious anemia   Chronic diastolic CHF (congestive heart failure) (HCC)   Sepsis (HCC)   Hypokalemia   Acute bronchitis   Acute respiratory failure with hypoxia ([place]): likely due to acute bronchitis, but clinically early stage of lobar pneumonia is also possible though CXR has no infiltration shown. - will admit to tele bed  as inpt - IV levaquin - Mucinex for cough  - prn Albuterol Nebs for SOB - Urine legionella and S. pneumococcal antigen - Follow up blood culture x2, sputum culture and respiratory virus panel  Sepsis due to possible acute bronchitis: Patient meets criteria for sepsis with leukocytosis, fever, and tachypnea. Lactic acid 2.60->0.93. Currently hemodynamically stable. - will hold off IVF since  Lactic acid is normalized and pt has history of dCHF, chest x-ray showed vascular congestion -will get Procalcitonin and trend lactic acid level per sepsis protocol. If lactic acid trends up, will start IVF  Stroke [facility]): -on plavix  Pernicious anemia: Hgb stable, 11.6 -continue VF64  Chronic diastolic CHF and lymphedema: pt is on lasix 40 mg daily. She has bilateral leg edema. chest x-ray showed vascular congestion, no obvious pulmonary edema.  2D echo on 10/13/2012 showed EF CT deficit 5% with grade 1 diastolic dysfunction. -Hold off Lasix due to sepsis -Check BNP  Hypokalemia: K= 3.0 on admission. - Repleted - Check Mg level  HTN: -Hold Lasix and amlodipine since patient is at risk of developing hypotension due to sepsis -IV hydralazine as needed    DVT ppx:  SQ Lovenox Code Status: Full code Family Communication: None at bed side.   Disposition Plan:  Anticipate discharge back to previous home environment Consults called:  none Admission status: Inpatient/tele      Date of Service 06/10/2018    Ivor Costa [facility] Pager [phone]  If 7PM-7AM, please contact night-coverage [email or web address] Password [facility] 06/10/2018, 3:39 AM

## 2018-06-10 NOTE — ED Notes (Signed)
Pt repositioned in bed so that she could eat.

## 2018-06-10 NOTE — Progress Notes (Signed)
[facility] PROGRESS NOTE  Connie Miranda ASN:[id number] DOB: Apr 07, 1933 DOA: 06/09/2018  PCP: [name], MD  Brief History/Interval Summary: 84 y.o. female with medical history significant of hypertension, stroke, G6PD deficiency, pernicious anemia, edema, CHF, morbid obesity, who presented with cough, fever and chills. Chest x-ray did not show any acute process however concern was for a respiratory infectious process.  She was admitted to the hospital and placed on antibiotics.  Reason for Visit:   Suspected community-acquired pneumonia  Consultants:  none  Procedures:  none  Antibiotics:  IV Levaquin  Subjective/Interval History: Patient denies any shortness of breath this morning.  Does have a cough with the clear expectoration.  Denies any chest pain.    ROS:   Denies any headaches.  No nausea vomiting.  Objective:  Vital Signs  Vitals:   06/10/18 1115 06/10/18 1130 06/10/18 1145 06/10/18 1200  BP:    (!) 142/48  Pulse: 73 76 74 73  Resp: (!) 22 (!) 22 (!) 26 19  Temp:      TempSrc:      SpO2: 100% 100% 100% 98%  Weight:      Height:        Intake/Output Summary (Last 24 hours) at 06/10/2018 1305 Last data filed at 06/10/2018 0144 Gross per 24 hour  Intake 150 ml  Output -  Net 150 ml   Filed Weights   06/09/18 2242  Weight: 113.4 kg (250 lb)    General appearance: alert, cooperative, appears stated age and no distress Head: Normocephalic, without obvious abnormality, atraumatic Resp: Coarse breath sounds bilaterally.  Few crackles at the bases.  No wheezing.  No rhonchi.  Normal effort at rest. Cardio: regular rate and rhythm, S1, S2 normal, no murmur, click, rub or gallop GI: soft, non-tender; bowel sounds normal; no masses,  no organomegaly Extremities: Chronic changes of venous stasis noted.  Mild edema. Pulses:Poorly palpable in the lower extremities Neurologic:  Left hemiparesis from previous stroke.  Lab Results:  Data Reviewed: I  have personally reviewed following labs and imaging studies  CBC: Recent Labs  Lab 06/09/18 2323  WBC 14.1*  NEUTROABS 12.6*  HGB 11.6*  HCT 37.9  MCV 98.4  PLT 193    Basic Metabolic Panel: Recent Labs  Lab 06/09/18 2323 06/10/18 0500  NA 139  --   K 3.6  --   CL 104  --   CO2 25  --   GLUCOSE 128*  --   BUN 12  --   CREATININE 1.06*  --   CALCIUM 9.3  --   MG  --  1.7    GFR: Estimated Creatinine Clearance: 48.7 mL/min (A) (by C-G formula based on SCr of 1.06 mg/dL (H)).  Liver Function Tests: Recent Labs  Lab 06/09/18 2323  AST 27  ALT 15  ALKPHOS 81  BILITOT 1.2  PROT 8.5*  ALBUMIN 2.8*    Coagulation Profile: Recent Labs  Lab 06/09/18 2323  INR 1.12     Recent Results (from the past 240 hour(s))  Respiratory Panel by PCR     Status: None   Collection Time: 06/10/18  3:58 AM  Result Value Ref Range Status   Adenovirus NOT DETECTED NOT DETECTED Final   Coronavirus 229E NOT DETECTED NOT DETECTED Final   Coronavirus HKU1 NOT DETECTED NOT DETECTED Final   Coronavirus NL63 NOT DETECTED NOT DETECTED Final   Coronavirus OC43 NOT DETECTED NOT DETECTED Final   Metapneumovirus NOT DETECTED NOT DETECTED Final  Rhinovirus / Enterovirus NOT DETECTED NOT DETECTED Final   Influenza A NOT DETECTED NOT DETECTED Final   Influenza B NOT DETECTED NOT DETECTED Final   Parainfluenza Virus 1 NOT DETECTED NOT DETECTED Final   Parainfluenza Virus 2 NOT DETECTED NOT DETECTED Final   Parainfluenza Virus 3 NOT DETECTED NOT DETECTED Final   Parainfluenza Virus 4 NOT DETECTED NOT DETECTED Final   Respiratory Syncytial Virus NOT DETECTED NOT DETECTED Final   Bordetella pertussis NOT DETECTED NOT DETECTED Final   Chlamydophila pneumoniae NOT DETECTED NOT DETECTED Final   Mycoplasma pneumoniae NOT DETECTED NOT DETECTED Final    Comment: Performed at [facility], [place] [address] [place], [place] [address]      Radiology Studies: Dg Chest Port 1 View  Result  Date: 06/09/2018 CLINICAL DATA:  Initial evaluation for acute cough and fever. EXAM: PORTABLE CHEST 1 VIEW COMPARISON:  Prior radiograph from 04/05/2017. FINDINGS: Mild cardiomegaly, stable from previous. Mediastinal silhouette within normal limits. Lungs mildly hypoinflated. Perihilar vascular congestion without overt pulmonary edema. No focal infiltrates. No pleural effusion. No pneumothorax. No acute osseus abnormality. Advanced degenerative changes about the shoulders. IMPRESSION: 1. Mild cardiomegaly with perihilar vascular congestion without overt pulmonary edema. 2. No other active cardiopulmonary disease. No focal infiltrates identified. Electronically Signed   By: Jeannine Boga M.D.   On: 06/09/2018 23:38     Medications:  Scheduled: . calcium-vitamin D  1 tablet Oral Q breakfast  . clopidogrel  75 mg Oral Daily  . [START ON 06/28/2018] cyanocobalamin  1,000 mcg Intramuscular Q30 days  . enoxaparin (LOVENOX) injection  40 mg Subcutaneous Daily  . mineral oil-hydrophilic petrolatum  1 application Topical Daily  . multivitamin with minerals  1 tablet Oral Daily   Continuous: . [START ON 06/11/2018] levofloxacin (LEVAQUIN) IV     BWG:YKZLDJTTSVXBL, albuterol, dextromethorphan-guaiFENesin, hydrALAZINE, ondansetron (ZOFRAN) IV, zolpidem  Assessment/Plan:    Acute respiratory failure with hypoxia  Possibly due to acute bronchitis the but clinically pneumonia is also a possibility.  Chest x-ray however did not show any infiltrates.  Could be because she might have been dehydrated.  She is appropriately on IV antibiotics.  P.r.n. Nebulizer treatments.  Mucinex.  Respiratory viral panel is negative.  Follow up on cultures.  Repeat chest x-ray tomorrow morning.  Strep pneumo urinary antigen is negative.  Sepsis due to  Unknown organism Possibly from respiratory source.  Procalcitonin level noted to be elevated.  Lactic acid level was elevated and has improved.  Follow up on cultures.     History of stroke She has old left-sided hemiparesis.  Continue Plavix.  PT and OT evaluation.  Does not ambulate much at home.  History of pernicious anemia  Continue home medications.  Monitor hemoglobin.    Chronic diastolic CHF and lymphedema Patient has a bilateral leg edema.  Chest x-ray did not show any obvious pulmonary edema.  Patient is on Lasix at home which has been held due to her sepsis.  Continue to monitor volume status.  Hold off on IV fluids.  Hypokalemia This has been repleted. magnesium level is 1.7.  Repeat labs tomorrow.    Essential hypertension  Monitor blood pressures closely.  Holding her home medications for now.  Morbid obesity Body mass index is 45.73 kg/m.   DVT Prophylaxis:   Lovenox    Code Status:  Full code Family Communication:  Discussed with the patient.  She would like for me to discuss with her brother as well. Disposition Plan:  Continue management as outlined above.  PT and OT evaluation.    LOS: 0 days   [facility] Pager [phone] 06/10/2018, 1:05 PM  If 7PM-7AM, please contact night-coverage at [email or web address], password [facility]

## 2018-06-10 NOTE — ED Notes (Signed)
Brother of pt Connie Miranda) called and spoke with pt on phone. Pt gave permission for MD to discuss medical information pertaining to the Pt with her brother, Connie Miranda. Admitting Md paged with brother's request for information as well as his phone number for a return call. Bruce does not live locally. Phone number [phone].

## 2018-06-10 NOTE — Progress Notes (Signed)
Pharmacy Antibiotic Note  Connie Miranda is a 84 y.o. female admitted on 06/09/2018 with Group B strep bacteremia.  Pharmacy has been consulted for cefazolin dosing. Patient is currently on IV Levaquin with improving lactate. Currently afebrile. CrCl ~ 45 mL/min  Plan: -Start cefazolin 2 gm IV Q 8 hours  -Consider stopping Levaquin  -Monitor CBC, renal fx, cultures and clinical progress   Height: 5\' 3"  (160 cm) Weight: 246 lb 4.1 oz (111.7 kg) IBW/kg (Calculated) : 52.4  Temp (24hrs), Avg:99.9 F (37.7 C), Min:97.9 F (36.6 C), Max:103.5 F (39.7 C)  Recent Labs  Lab 06/09/18 2323 06/09/18 2340 06/10/18 0140 06/10/18 0255  WBC 14.1*  --   --   --   CREATININE 1.06*  --   --   --   LATICACIDVEN  --  2.68* 0.93 0.9    Estimated Creatinine Clearance: 49.2 mL/min (A) (by C-G formula based on SCr of 1.06 mg/dL (H)).    Allergies  Allergen Reactions  . Asa [Aspirin] Other (See Comments)    Doctor told her not to take due to type anemia  . Phosphate Other (See Comments)    Anything containing phosphate 6. Unknown reaction  . Codeine Other (See Comments)    Made her too sleepy  . Penicillins Itching and Swelling    Tolerates keflex    Antimicrobials this admission: Levaquin 6/13 >>  Cefazolin 6/13 >>   Dose adjustments this admission: None  Microbiology results: 6/12 BCx: 2/4 BCx with GPC (Group B strep on BCID) 6/12 UCx: sent    Thank you for allowing pharmacy to be a part of this patient's care.  Albertina Parr, PharmD., BCPS Clinical Pharmacist Clinical phone for 06/10/18 until 10:30pm: [phone] If after 10:30pm, please call main pharmacy at: [phone]

## 2018-06-10 NOTE — Progress Notes (Signed)

## 2018-06-10 NOTE — Evaluation (Signed)
Physical Therapy Evaluation Patient Details Name: Connie Miranda MRN: [id number] DOB: 1933-03-16 Today's Date: 06/10/2018   History of Present Illness  84 yo female with onset of sepsis from susp PNA, with cardiomegaly and LE edema in CHF.  Has fever, low K+ and maintaining O2 sats with room air.  PMHx;  HTN, L hemi from stroke, LE edema, CHF, morbid obesity, pernicious anemia,   Clinical Impression  Pt is up to side of bed with PT supporting trunk, resistance from pt to return to bed as tested.  Follow pt acutely to look at tolerance for activity, progression to gait and balance.  This plan is in place to get pt to SNF and then home.    Follow Up Recommendations SNF    Equipment Recommendations  None recommended by PT    Recommendations for Other Services       Precautions / Restrictions Precautions Precautions: Fall(telemetry) Restrictions Weight Bearing Restrictions: No      Mobility  Bed Mobility Overal bed mobility: Needs Assistance Bed Mobility: Supine to Sit;Sit to Supine     Supine to sit: Total assist Sit to supine: Total assist      Transfers Overall transfer level: Needs assistance Equipment used: 1 person hand held assist Transfers: Sit to/from Stand Sit to Stand: Total assist            Ambulation/Gait             General Gait Details: unable  Stairs            Wheelchair Mobility    Modified Rankin (Stroke Patients Only)       Balance Overall balance assessment: Mild deficits observed, not formally tested                                           Pertinent Vitals/Pain Pain Assessment: Faces Pain Location: legs Pain Descriptors / Indicators: Heaviness;Tender Pain Intervention(s): Limited activity within patient's tolerance;Monitored during session;Premedicated before session;Repositioned    Home Living Family/patient expects to be discharged to:: Unsure Living Arrangements: Alone                    Prior Function Level of Independence: Independent               Hand Dominance   Dominant Hand: Right    Extremity/Trunk Assessment   Upper Extremity Assessment Upper Extremity Assessment: Generalized weakness    Lower Extremity Assessment Lower Extremity Assessment: Generalized weakness    Cervical / Trunk Assessment Cervical / Trunk Assessment: Kyphotic  Communication   Communication: No difficulties  Cognition Arousal/Alertness: Awake/alert Behavior During Therapy: WFL for tasks assessed/performed Overall Cognitive Status: Within Functional Limits for tasks assessed                                        General Comments General comments (skin integrity, edema, etc.): edema and discoloration over LE's with dense pitting, trunk is edematous and weak    Exercises     Assessment/Plan    PT Assessment Patient needs continued PT services  PT Problem List Decreased strength;Decreased range of motion;Decreased activity tolerance;Decreased balance;Decreased mobility;Decreased coordination;Decreased cognition;Decreased knowledge of use of DME;Decreased safety awareness;Decreased knowledge of precautions;Cardiopulmonary status limiting activity;Obesity;Decreased skin integrity       PT Treatment  Interventions DME instruction;Functional mobility training;Therapeutic activities;Therapeutic exercise;Balance training;Neuromuscular re-education;Patient/family education    PT Goals (Current goals can be found in the Care Plan section)  Acute Rehab PT Goals Patient Stated Goal: To feel better PT Goal Formulation: With patient Time For Goal Achievement: 06/24/18 Potential to Achieve Goals: Good    Frequency Min 2X/week   Barriers to discharge Inaccessible home environment;Decreased caregiver support home     Co-evaluation               AM-PAC PT "6 Clicks" Daily Activity  Outcome Measure Difficulty turning over in bed (including adjusting  bedclothes, sheets and blankets)?: Unable Difficulty moving from lying on back to sitting on the side of the bed? : Unable Difficulty sitting down on and standing up from a chair with arms (e.g., wheelchair, bedside commode, etc,.)?: Unable Help needed moving to and from a bed to chair (including a wheelchair)?: Total Help needed walking in hospital room?: Total Help needed climbing 3-5 steps with a railing? : Total 6 Click Score: 6    End of Session Equipment Utilized During Treatment: Oxygen Activity Tolerance: Patient limited by lethargy;Patient limited by fatigue Patient left: in bed;with call bell/phone within reach;with bed alarm set;with nursing/sitter in room Nurse Communication: Mobility status;Weight bearing status PT Visit Diagnosis: Unsteadiness on feet (R26.81);Other abnormalities of gait and mobility (R26.89);Muscle weakness (generalized) (M62.81);Difficulty in walking, not elsewhere classified (R26.2);Dizziness and giddiness (R42);Adult, failure to thrive (R62.7);Pain Pain - Right/Left: (Bilateral) Pain - part of body: Leg    Time: 2549-8264 PT Time Calculation (min) (ACUTE ONLY): 15 min   Charges:   PT Evaluation $PT Eval Moderate Complexity: 1 Mod     PT G Codes:   PT G-Codes **NOT FOR INPATIENT CLASS** Functional Assessment Tool Used: AM-PAC 6 Clicks Basic Mobility     Ramond Dial 06/10/2018, 10:48 PM   Mee Hives, PT MS Acute Rehab Dept. Number: [place] [facility] [place]

## 2018-06-10 NOTE — ED Notes (Signed)
Pt Sister Phone Number [phone]

## 2018-06-10 NOTE — Progress Notes (Signed)
PHARMACY - PHYSICIAN COMMUNICATION CRITICAL VALUE ALERT - BLOOD CULTURE IDENTIFICATION (BCID)  Connie Miranda is an 84 y.o. female who presented to [facility] on 06/09/2018 with a chief complaint of pneumonia   Assessment: Patient was placed on IV Levaquin due to concern for pneumonia from unknown organism. 1/2 BCx grew group B strep on BCID.   Name of physician (or Provider) Contacted: K Schorr  Current antibiotics: Levaquin 750 mg IV Q 48 hours  Changes to prescribed antibiotics recommended: D/c Levaquin and start cefazolin Provider started cefazolin. Levaquin remains active   Results for orders placed or performed during the hospital encounter of 06/09/18  Blood Culture ID Panel (Reflexed) (Collected: 06/09/2018 11:23 PM)  Result Value Ref Range   Enterococcus species NOT DETECTED NOT DETECTED   Listeria monocytogenes NOT DETECTED NOT DETECTED   Staphylococcus species DETECTED (A) NOT DETECTED   Staphylococcus aureus NOT DETECTED NOT DETECTED   Methicillin resistance NOT DETECTED NOT DETECTED   Streptococcus species DETECTED (A) NOT DETECTED   Streptococcus agalactiae DETECTED (A) NOT DETECTED   Streptococcus pneumoniae NOT DETECTED NOT DETECTED   Streptococcus pyogenes NOT DETECTED NOT DETECTED   Acinetobacter baumannii NOT DETECTED NOT DETECTED   Enterobacteriaceae species NOT DETECTED NOT DETECTED   Enterobacter cloacae complex NOT DETECTED NOT DETECTED   Escherichia coli NOT DETECTED NOT DETECTED   Klebsiella oxytoca NOT DETECTED NOT DETECTED   Klebsiella pneumoniae NOT DETECTED NOT DETECTED   Proteus species NOT DETECTED NOT DETECTED   Serratia marcescens NOT DETECTED NOT DETECTED   Haemophilus influenzae NOT DETECTED NOT DETECTED   Neisseria meningitidis NOT DETECTED NOT DETECTED   Pseudomonas aeruginosa NOT DETECTED NOT DETECTED   Candida albicans NOT DETECTED NOT DETECTED   Candida glabrata NOT DETECTED NOT DETECTED   Candida krusei NOT DETECTED NOT DETECTED   Candida parapsilosis NOT DETECTED NOT DETECTED   Candida tropicalis NOT DETECTED NOT DETECTED    Albertina Parr, PharmD., BCPS Clinical Pharmacist Clinical phone for 06/10/18 until 10:30pm: [phone] If after 10:30pm, please call main pharmacy at: [phone]

## 2018-06-11 ENCOUNTER — Inpatient Hospital Stay (HOSPITAL_COMMUNITY): Payer: Medicare Other

## 2018-06-11 DIAGNOSIS — R7881 Bacteremia: Secondary | ICD-10-CM

## 2018-06-11 DIAGNOSIS — Z885 Allergy status to narcotic agent status: Secondary | ICD-10-CM

## 2018-06-11 DIAGNOSIS — D510 Vitamin B12 deficiency anemia due to intrinsic factor deficiency: Secondary | ICD-10-CM

## 2018-06-11 DIAGNOSIS — D550 Anemia due to glucose-6-phosphate dehydrogenase [G6PD] deficiency: Secondary | ICD-10-CM

## 2018-06-11 DIAGNOSIS — Z886 Allergy status to analgesic agent status: Secondary | ICD-10-CM

## 2018-06-11 DIAGNOSIS — I110 Hypertensive heart disease with heart failure: Secondary | ICD-10-CM

## 2018-06-11 DIAGNOSIS — B957 Other staphylococcus as the cause of diseases classified elsewhere: Secondary | ICD-10-CM

## 2018-06-11 DIAGNOSIS — J209 Acute bronchitis, unspecified: Secondary | ICD-10-CM

## 2018-06-11 DIAGNOSIS — Z87891 Personal history of nicotine dependence: Secondary | ICD-10-CM

## 2018-06-11 DIAGNOSIS — B951 Streptococcus, group B, as the cause of diseases classified elsewhere: Secondary | ICD-10-CM

## 2018-06-11 DIAGNOSIS — E669 Obesity, unspecified: Secondary | ICD-10-CM

## 2018-06-11 DIAGNOSIS — Z8249 Family history of ischemic heart disease and other diseases of the circulatory system: Secondary | ICD-10-CM

## 2018-06-11 DIAGNOSIS — R609 Edema, unspecified: Secondary | ICD-10-CM

## 2018-06-11 DIAGNOSIS — I10 Essential (primary) hypertension: Secondary | ICD-10-CM

## 2018-06-11 DIAGNOSIS — Z880 Allergy status to penicillin: Secondary | ICD-10-CM

## 2018-06-11 DIAGNOSIS — R0989 Other specified symptoms and signs involving the circulatory and respiratory systems: Secondary | ICD-10-CM

## 2018-06-11 DIAGNOSIS — Z888 Allergy status to other drugs, medicaments and biological substances status: Secondary | ICD-10-CM

## 2018-06-11 DIAGNOSIS — I509 Heart failure, unspecified: Secondary | ICD-10-CM

## 2018-06-11 LAB — CBC
HEMATOCRIT: 33.7 % — AB (ref 36.0–46.0)
Hemoglobin: 10.5 g/dL — ABNORMAL LOW (ref 12.0–15.0)
MCH: 30.4 pg (ref 26.0–34.0)
MCHC: 31.2 g/dL (ref 30.0–36.0)
MCV: 97.7 fL (ref 78.0–100.0)
Platelets: 318 10*3/uL (ref 150–400)
RBC: 3.45 MIL/uL — AB (ref 3.87–5.11)
RDW: 12.8 % (ref 11.5–15.5)
WBC: 9.7 10*3/uL (ref 4.0–10.5)

## 2018-06-11 LAB — BASIC METABOLIC PANEL
ANION GAP: 6 (ref 5–15)
BUN: 17 mg/dL (ref 6–20)
CO2: 27 mmol/L (ref 22–32)
CREATININE: 0.98 mg/dL (ref 0.44–1.00)
Calcium: 9.4 mg/dL (ref 8.9–10.3)
Chloride: 106 mmol/L (ref 101–111)
GFR calc non Af Amer: 52 mL/min — ABNORMAL LOW (ref >60)
Glucose, Bld: 121 mg/dL — ABNORMAL HIGH (ref 65–99)
POTASSIUM: 4.6 mmol/L (ref 3.5–5.1)
SODIUM: 139 mmol/L (ref 135–145)

## 2018-06-11 LAB — URINE CULTURE: Culture: NO GROWTH

## 2018-06-11 LAB — LEGIONELLA PNEUMOPHILA SEROGP 1 UR AG: L. PNEUMOPHILA SEROGP 1 UR AG: NEGATIVE

## 2018-06-11 MED ORDER — CEFAZOLIN SODIUM-DEXTROSE 2-4 GM/100ML-% IV SOLN
2.0000 g | Freq: Three times a day (TID) | INTRAVENOUS | Status: DC
  Administered 2018-06-12 – 2018-06-13 (×4): 2 g via INTRAVENOUS
  Filled 2018-06-11 (×6): qty 100

## 2018-06-11 MED ORDER — AMLODIPINE BESYLATE 10 MG PO TABS
10.0000 mg | ORAL_TABLET | Freq: Every day | ORAL | Status: DC
  Administered 2018-06-11 – 2018-06-14 (×4): 10 mg via ORAL
  Filled 2018-06-11 (×4): qty 1

## 2018-06-11 MED ORDER — FUROSEMIDE 40 MG PO TABS
40.0000 mg | ORAL_TABLET | Freq: Every day | ORAL | Status: DC
  Administered 2018-06-12 – 2018-06-14 (×3): 40 mg via ORAL
  Filled 2018-06-11 (×3): qty 1

## 2018-06-11 NOTE — Progress Notes (Addendum)
[facility] PROGRESS NOTE  Connie Miranda ZOX:[id number] DOB: April 24, 1933 DOA: 06/09/2018  PCP: [name], MD  Brief History/Interval Summary: 84 y.o. female with medical history significant of hypertension, stroke, G6PD deficiency, pernicious anemia, edema, CHF, morbid obesity, who presented with cough, fever and chills. Chest x-ray did not show any acute process however concern was for a respiratory infectious process.  She was admitted to the hospital and placed on antibiotics.  Reason for Visit:   Suspected community-acquired pneumonia  Consultants:  none  Procedures:  none  Antibiotics:  IV Levaquin Cefazolin was added last night  Subjective/Interval History: She denies any complaints this morning.  States that her cough has improved.  Denies any sputum production.  Denies any chest pain.  No nausea vomiting.     ROS:   Denies any headaches  Objective:  Vital Signs  Vitals:   06/11/18 0021 06/11/18 0023 06/11/18 0048 06/11/18 0430  BP:  (!) 159/54  (!) 155/56  Pulse:  80  85  Resp:   18 18  Temp: 98.6 F (37 C)   98.9 F (37.2 C)  TempSrc:    Oral  SpO2:  93%  95%  Weight:    112.8 kg (248 lb 10.9 oz)  Height:        Intake/Output Summary (Last 24 hours) at 06/11/2018 0923 Last data filed at 06/11/2018 0048 Gross per 24 hour  Intake 586.67 ml  Output 250 ml  Net 336.67 ml   Filed Weights   06/09/18 2242 06/10/18 1543 06/11/18 0430  Weight: 113.4 kg (250 lb) 111.7 kg (246 lb 4.1 oz) 112.8 kg (248 lb 10.9 oz)    General appearance: Awake alert.  In no distress Resp: Good air entry bilaterally.  Coarse breath sounds.  No crackles heard today.  No wheezing.  No rhonchi.  Normal effort at rest. Cardio: S1-S2 is normal regular.  No S3-S4.  No rubs murmurs or bruit. GI: Abdomen remains soft.  Nontender nondistended.  Bowel sounds are present.  No masses organomegaly. Extremities: Chronic changes of venous stasis noted.  No obvious wounds present.   Scaly skin noted. Mild edema. Pulses:Poorly palpable in the lower extremities Neurologic:  Left hemiparesis from previous stroke.  Lab Results:  Data Reviewed: I have personally reviewed following labs and imaging studies  CBC: Recent Labs  Lab 06/09/18 2323 06/11/18 0543  WBC 14.1* 9.7  NEUTROABS 12.6*  --   HGB 11.6* 10.5*  HCT 37.9 33.7*  MCV 98.4 97.7  PLT 311 811    Basic Metabolic Panel: Recent Labs  Lab 06/09/18 2323 06/10/18 0500 06/11/18 0543  NA 139  --  139  K 3.6  --  4.6  CL 104  --  106  CO2 25  --  27  GLUCOSE 128*  --  121*  BUN 12  --  17  CREATININE 1.06*  --  0.98  CALCIUM 9.3  --  9.4  MG  --  1.7  --     GFR: Estimated Creatinine Clearance: 53.5 mL/min (by C-G formula based on SCr of 0.98 mg/dL).  Liver Function Tests: Recent Labs  Lab 06/09/18 2323  AST 27  ALT 15  ALKPHOS 81  BILITOT 1.2  PROT 8.5*  ALBUMIN 2.8*    Coagulation Profile: Recent Labs  Lab 06/09/18 2323  INR 1.12     Recent Results (from the past 240 hour(s))  Urine culture     Status: None   Collection Time: 06/09/18 11:21 PM  Result Value Ref Range Status   Specimen Description URINE, CATHETERIZED  Final   Special Requests NONE  Final   Culture   Final    NO GROWTH Performed at [facility], [address], [place], [place] [address]    Report Status 06/11/2018 FINAL  Final  Culture, blood (Routine x 2)     Status: None (Preliminary result)   Collection Time: 06/09/18 11:23 PM  Result Value Ref Range Status   Specimen Description BLOOD LEFT ANTECUBITAL  Final   Special Requests   Final    BOTTLES DRAWN AEROBIC AND ANAEROBIC Blood Culture adequate volume   Culture  Setup Time   Final    GRAM POSITIVE COCCI IN BOTH AEROBIC AND ANAEROBIC BOTTLES CRITICAL RESULT CALLED TO, READ BACK BY AND VERIFIED WITHAsencion Islam PHARMD 2001 06/10/18 A BROWNING Performed at [facility], [place] [address], [place], [place] [address]    Culture GRAM  POSITIVE COCCI  Final   Report Status PENDING  Incomplete  Blood Culture ID Panel (Reflexed)     Status: Abnormal   Collection Time: 06/09/18 11:23 PM  Result Value Ref Range Status   Enterococcus species NOT DETECTED NOT DETECTED Final   Listeria monocytogenes NOT DETECTED NOT DETECTED Final   Staphylococcus species DETECTED (A) NOT DETECTED Final    Comment: Methicillin (oxacillin) susceptible coagulase negative staphylococcus. Possible blood culture contaminant (unless isolated from more than one blood culture draw or clinical case suggests pathogenicity). No antibiotic treatment is indicated for blood  culture contaminants. CRITICAL RESULT CALLED TO, READ BACK BY AND VERIFIED WITH: B MANCHERIL PHARMD 2001 06/10/18 A BROWNING    Staphylococcus aureus NOT DETECTED NOT DETECTED Final   Methicillin resistance NOT DETECTED NOT DETECTED Final   Streptococcus species DETECTED (A) NOT DETECTED Final    Comment: CRITICAL RESULT CALLED TO, READ BACK BY AND VERIFIED WITH: B MANCHERIL PHARMD 2001 06/10/18 A BROWNING    Streptococcus agalactiae DETECTED (A) NOT DETECTED Final    Comment: CRITICAL RESULT CALLED TO, READ BACK BY AND VERIFIED WITH: B MANCHERIL PHARMD 2001 06/10/18 A BROWNING    Streptococcus pneumoniae NOT DETECTED NOT DETECTED Final   Streptococcus pyogenes NOT DETECTED NOT DETECTED Final   Acinetobacter baumannii NOT DETECTED NOT DETECTED Final   Enterobacteriaceae species NOT DETECTED NOT DETECTED Final   Enterobacter cloacae complex NOT DETECTED NOT DETECTED Final   Escherichia coli NOT DETECTED NOT DETECTED Final   Klebsiella oxytoca NOT DETECTED NOT DETECTED Final   Klebsiella pneumoniae NOT DETECTED NOT DETECTED Final   Proteus species NOT DETECTED NOT DETECTED Final   Serratia marcescens NOT DETECTED NOT DETECTED Final   Haemophilus influenzae NOT DETECTED NOT DETECTED Final   Neisseria meningitidis NOT DETECTED NOT DETECTED Final   Pseudomonas aeruginosa NOT DETECTED NOT  DETECTED Final   Candida albicans NOT DETECTED NOT DETECTED Final   Candida glabrata NOT DETECTED NOT DETECTED Final   Candida krusei NOT DETECTED NOT DETECTED Final   Candida parapsilosis NOT DETECTED NOT DETECTED Final   Candida tropicalis NOT DETECTED NOT DETECTED Final    Comment: Performed at [facility], [place] [address], [place], [place] [address]  Respiratory Panel by PCR     Status: None   Collection Time: 06/10/18  3:58 AM  Result Value Ref Range Status   Adenovirus NOT DETECTED NOT DETECTED Final   Coronavirus 229E NOT DETECTED NOT DETECTED Final   Coronavirus HKU1 NOT DETECTED NOT DETECTED Final   Coronavirus NL63  NOT DETECTED NOT DETECTED Final   Coronavirus OC43 NOT DETECTED NOT DETECTED Final   Metapneumovirus NOT DETECTED NOT DETECTED Final   Rhinovirus / Enterovirus NOT DETECTED NOT DETECTED Final   Influenza A NOT DETECTED NOT DETECTED Final   Influenza B NOT DETECTED NOT DETECTED Final   Parainfluenza Virus 1 NOT DETECTED NOT DETECTED Final   Parainfluenza Virus 2 NOT DETECTED NOT DETECTED Final   Parainfluenza Virus 3 NOT DETECTED NOT DETECTED Final   Parainfluenza Virus 4 NOT DETECTED NOT DETECTED Final   Respiratory Syncytial Virus NOT DETECTED NOT DETECTED Final   Bordetella pertussis NOT DETECTED NOT DETECTED Final   Chlamydophila pneumoniae NOT DETECTED NOT DETECTED Final   Mycoplasma pneumoniae NOT DETECTED NOT DETECTED Final    Comment: Performed at [facility], [place] [address], [place], [place] [address]      Radiology Studies: Dg Chest Port 1 View  Result Date: 06/11/2018 CLINICAL DATA:  Cough EXAM: PORTABLE CHEST 1 VIEW COMPARISON:  06/09/2017 FINDINGS: Cardiac shadow is stable. The lungs are well aerated bilaterally. Mild vascular congestion remains without significant pulmonary edema. Degenerative changes of the shoulder joints are noted. IMPRESSION: No significant change from the previous exam. Electronically Signed   By: [name] M.D.    On: 06/11/2018 07:27   Dg Chest Port 1 View  Result Date: 06/09/2018 CLINICAL DATA:  Initial evaluation for acute cough and fever. EXAM: PORTABLE CHEST 1 VIEW COMPARISON:  Prior radiograph from 04/05/2017. FINDINGS: Mild cardiomegaly, stable from previous. Mediastinal silhouette within normal limits. Lungs mildly hypoinflated. Perihilar vascular congestion without overt pulmonary edema. No focal infiltrates. No pleural effusion. No pneumothorax. No acute osseus abnormality. Advanced degenerative changes about the shoulders. IMPRESSION: 1. Mild cardiomegaly with perihilar vascular congestion without overt pulmonary edema. 2. No other active cardiopulmonary disease. No focal infiltrates identified. Electronically Signed   By: Jeannine Boga M.D.   On: 06/09/2018 23:38     Medications:  Scheduled: . calcium-vitamin D  1 tablet Oral Q breakfast  . clopidogrel  75 mg Oral Daily  . [START ON 06/28/2018] cyanocobalamin  1,000 mcg Intramuscular Q30 days  . enoxaparin (LOVENOX) injection  40 mg Subcutaneous Daily  . guaiFENesin  600 mg Oral BID  . mineral oil-hydrophilic petrolatum  1 application Topical Daily  . multivitamin with minerals  1 tablet Oral Daily   Continuous: .  ceFAZolin (ANCEF) IV Stopped (06/11/18 0510)  . levofloxacin (LEVAQUIN) IV     ZJQ:[id number], albuterol, hydrALAZINE, ondansetron (ZOFRAN) IV, zolpidem  Assessment/Plan:    Acute respiratory failure with hypoxia  Symptoms thought to be secondary to pneumonia versus bronchitis.  Chest x-ray did not show any infiltrate.  Repeated this morning and again does not show any infiltrate.  Patient was placed on Levaquin.  Overnight cefazolin was added due to positive blood cultures.  See below.  Respiratory status is stable.  Saturating normal on room air now.  Respiratory viral panel is negative.  Strep pneumonia urinary antigen is negative.   Sepsis Possibly from respiratory source.  Procalcitonin level noted to be  elevated.  Lactic acid level was elevated and has improved.  Blood cultures positive.  See below.  Bacteremia Blood culture positive for strep agalactiae as well as coag negative staph.  Confusing picture.  Patient was started on cefazolin last night.  Discussed with Dr. Linus Salmons who will consult on this patient.  History of stroke Stable.  Has old left-sided hemiparesis.  Continue Plavix.  PT and OT evaluation.  Does  not ambulate much at home.  History of pernicious anemia  Continue home medications.  Hemoglobin is stable.  Chronic diastolic CHF and lymphedema Volume status appears to be stable.  Lasix is on hold but could be resumed tomorrow.  Holding IV fluids.    Hypokalemia Repleted.  Potassium levels are normal today.  Essential hypertension  Blood pressure suboptimally controlled.  Resume her amlodipine.  Morbid obesity Body mass index is 44.05 kg/m.   DVT Prophylaxis:   Lovenox    Code Status:  Full code Family Communication: Discussed with the patient.  Also discussed with her brother yesterday. Disposition Plan: Management as outlined above.  Await ID input.  PT is recommending skilled nursing facility.  Consult Education officer, museum.    LOS: 1 day   [facility] Pager [phone] 06/11/2018, 9:23 AM  If 7PM-7AM, please contact night-coverage at [email or web address], password [facility]

## 2018-06-11 NOTE — Progress Notes (Signed)
OT NOTE  Pt with plan to dc SNF per chart and will defer to SNF placement as patient does not ambulate total (A) for all care at this time per chart.    Jeri Modena   OTR/L Pager: [phone] Office: [phone] .

## 2018-06-11 NOTE — Consult Note (Signed)
[facility] for Infectious Disease    Date of Admission:  06/09/2018   Total days of antibiotics 3        Day 2 cefazolin              Reason for Consult: bacteremia     Referring Provider: [place] Pink  Primary Care Provider: [name], MD   Assessment: Connie Miranda is a 84 y.o. female admitted with shortness of breath, cough and fever. Work up revealed possible polymicrobial blood culture (1/2 coagulase negative staph and unidentified GPC with rapid ID showing group b strep). She is feeling much improved and afebrile now on day 3 of antibiotics. She is anxious to go home.   Possible CAP - CXR indicates vascular congestion only and seems ? bronchitis. Respiratory panel negativeIt is unlikely she has a polymicrobial bacteremia with only 1 set of positive cultures. It is possible that both of these organisms are skin contaminant but could be secondary GBS bacteremia from pulmonary source. She has no hardware in place, no cardiac device in place, no cellulitis (although her legs are quite edematous and have lots of cracked skin), edentulous, no wounds, no other complaints outside of pulmonary.She has decreased lung sounds in general but R>L on my exam. No wheezing. Her RA sat > 90%, no longer tachypneic, tachycardic or febrile - would treat for 5 days with antibiotics for community acquired pneumonia.   Dr. Megan Salon is available over the weekend for questions @ [phone].    Plan: 1. Cough = bronchitis vs CAP. Would treat with Cefdinir 300 mg BID for 5 days.   2. Bacteremia = likely contaminant. Would consider repeating blood cultures today.   Principal Problem:   Acute respiratory failure with hypoxia (HCC) Active Problems:   Hypertension   Stroke (HCC)   Pernicious anemia   Chronic diastolic CHF (congestive heart failure) (HCC)   Sepsis (HCC)   Hypokalemia   Acute bronchitis   . amLODipine  10 mg Oral Daily  . calcium-vitamin D  1 tablet Oral Q breakfast  .  clopidogrel  75 mg Oral Daily  . [START ON 06/28/2018] cyanocobalamin  1,000 mcg Intramuscular Q30 days  . enoxaparin (LOVENOX) injection  40 mg Subcutaneous Daily  . [START ON 06/12/2018] furosemide  40 mg Oral Q breakfast  . guaiFENesin  600 mg Oral BID  . mineral oil-hydrophilic petrolatum  1 application Topical Daily  . multivitamin with minerals  1 tablet Oral Daily    HPI: Connie Miranda is a 84 y.o. female admitted to [facility] from home following ER visit for cough, fever and chills. She has a history significant for HTN, CVA, G6PD deficiency, pernicious anemia, edema, CHF, obesity.   Prior to admission on 06/09/2018 she had been having worsening cough for 2-3 days. Does not characerize much mucus production and denies SOB presently but did report this to EMS when they arrived. She has had associated fever and chills.   In the ED she was found to have leukocytosis (14.1K), elevated lactic acid, fever of 103.5 F, tachypnea and hypoxia on room air @ 90% as well as decreased air movement and scattered crackles and wheezing on assessment. CXR did not show any infiltrate. Also found to have bacteremia due to group b strep and possibly CoNS - both from one set of cultures and nothing from second set is growing. She has been placed on levofloxacin and has improved. Cough is better, WBC reduced and  has been afebrile this morning.   Review of Systems: Review of Systems  Constitutional: Positive for chills and fever. Negative for malaise/fatigue.  HENT: Negative for tinnitus.   Eyes: Negative for blurred vision and photophobia.  Respiratory: Negative for cough, sputum production, shortness of breath and wheezing.   Cardiovascular: Negative for chest pain.  Gastrointestinal: Negative for abdominal pain, diarrhea, nausea and vomiting.  Genitourinary: Negative for dysuria.  Musculoskeletal: Negative for joint pain.  Skin: Negative for rash.  Neurological: Negative for headaches.    Past  Medical History:  Diagnosis Date  . Edema   . Edema of both legs 01/2016  . Glucose-6-phosphate dehydrogenase deficiency (HCC)    possible diagnosis of G6PD per patient "phosphate 6 deficiency"  . Hypertension   . Osteoarthritis   . Pernicious anemia   . Stroke [facility]) 09/2000   couldn't move her left foot which resolved    Social History   Tobacco Use  . Smoking status: Former Smoker    Types: Cigarettes    Last attempt to quit: 12/29/1993    Years since quitting: 24.4  . Smokeless tobacco: Never Used  Substance Use Topics  . Alcohol use: No  . Drug use: No    Family History  Problem Relation Age of Onset  . Diabetes Brother   . Diabetes Brother   . Heart failure Mother 78  . Heart attack Father 91   Allergies  Allergen Reactions  . Asa [Aspirin] Other (See Comments)    Doctor told her not to take due to type anemia  . Phosphate Other (See Comments)    Anything containing phosphate 6. Unknown reaction  . Codeine Other (See Comments)    Made her too sleepy  . Penicillins Itching and Swelling    Tolerates keflex    OBJECTIVE: Blood pressure (!) 155/56, pulse 85, temperature 98.9 F (37.2 C), temperature source Oral, resp. rate 18, height 5\' 3"  (1.6 m), weight 248 lb 10.9 oz (112.8 kg), SpO2 95 %.  Physical Exam  Constitutional: She is oriented to person, place, and time. She appears well-developed and well-nourished.  Seated comfortably in chair.   HENT:  Mouth/Throat: Mucous membranes are normal. No oral lesions. Normal dentition. No dental abscesses. No oropharyngeal exudate.  Cardiovascular: Normal rate, regular rhythm and normal heart sounds.  Pulmonary/Chest: Effort normal. She has decreased breath sounds in the right middle field and the right lower field.  Abdominal: Soft. She exhibits no distension. There is no tenderness.  Lymphadenopathy:    She has no cervical adenopathy.  Neurological: She is alert and oriented to person, place, and time.  Skin: Skin  is warm and dry. Capillary refill takes less than 2 seconds. No rash noted.  Psychiatric: She has a normal mood and affect. Judgment normal.  In good spirits today and engaged in care discussion  Vitals reviewed.   Lab Results Lab Results  Component Value Date   WBC 9.7 06/11/2018   HGB 10.5 (L) 06/11/2018   HCT 33.7 (L) 06/11/2018   MCV 97.7 06/11/2018   PLT 318 06/11/2018    Lab Results  Component Value Date   CREATININE 0.98 06/11/2018   BUN 17 06/11/2018   NA 139 06/11/2018   K 4.6 06/11/2018   CL 106 06/11/2018   CO2 27 06/11/2018    Lab Results  Component Value Date   ALT 15 06/09/2018   AST 27 06/09/2018   ALKPHOS 81 06/09/2018   BILITOT 1.2 06/09/2018     Microbiology: RVP  negative 6/13 BCx 6/13 >> BCID Staph species + Strep agalactiae with CoNS on culture and unidentified GPC; 2nd culture without growth   Janene Madeira, MSN, NP-C [facility] for Infectious [place] Cell: [phone] Pager: [phone]  06/11/2018 2:40 PM

## 2018-06-11 NOTE — Plan of Care (Signed)
  Problem: Health Behavior/Discharge Planning: Goal: Ability to manage health-related needs will improve Outcome: Progressing   Problem: Education: Goal: Knowledge of General Education information will improve Outcome: Progressing   Problem: Education: Goal: Ability to demonstrate management of disease process will improve Outcome: Progressing   Problem: Activity: Goal: Capacity to carry out activities will improve Outcome: Progressing

## 2018-06-11 NOTE — Plan of Care (Signed)
  Problem: Education: Goal: Knowledge of General Education information will improve Outcome: Progressing   Problem: Health Behavior/Discharge Planning: Goal: Ability to manage health-related needs will improve Outcome: Progressing   Problem: Clinical Measurements: Goal: Respiratory complications will improve Outcome: Progressing   

## 2018-06-12 LAB — BASIC METABOLIC PANEL
Anion gap: 6 (ref 5–15)
BUN: 14 mg/dL (ref 6–20)
CALCIUM: 9.1 mg/dL (ref 8.9–10.3)
CO2: 27 mmol/L (ref 22–32)
CREATININE: 0.81 mg/dL (ref 0.44–1.00)
Chloride: 105 mmol/L (ref 101–111)
GFR calc Af Amer: >60 mL/min (ref >60)
GLUCOSE: 106 mg/dL — AB (ref 65–99)
POTASSIUM: 3.6 mmol/L (ref 3.5–5.1)
SODIUM: 138 mmol/L (ref 135–145)

## 2018-06-12 LAB — CBC
HCT: 32.7 % — ABNORMAL LOW (ref 36.0–46.0)
Hemoglobin: 10.1 g/dL — ABNORMAL LOW (ref 12.0–15.0)
MCH: 30.1 pg (ref 26.0–34.0)
MCHC: 30.9 g/dL (ref 30.0–36.0)
MCV: 97.3 fL (ref 78.0–100.0)
PLATELETS: 300 10*3/uL (ref 150–400)
RBC: 3.36 MIL/uL — AB (ref 3.87–5.11)
RDW: 12.8 % (ref 11.5–15.5)
WBC: 8 10*3/uL (ref 4.0–10.5)

## 2018-06-12 MED ORDER — POTASSIUM CHLORIDE CRYS ER 20 MEQ PO TBCR
40.0000 meq | EXTENDED_RELEASE_TABLET | Freq: Once | ORAL | Status: AC
  Administered 2018-06-12: 40 meq via ORAL
  Filled 2018-06-12: qty 2

## 2018-06-12 MED ORDER — DICLOFENAC SODIUM 1 % TD GEL
2.0000 g | Freq: Four times a day (QID) | TRANSDERMAL | Status: DC
  Administered 2018-06-12 – 2018-06-14 (×7): 2 g via TOPICAL
  Filled 2018-06-12: qty 100

## 2018-06-12 MED ORDER — POTASSIUM CHLORIDE CRYS ER 20 MEQ PO TBCR
20.0000 meq | EXTENDED_RELEASE_TABLET | Freq: Every day | ORAL | Status: DC
  Administered 2018-06-13 – 2018-06-14 (×2): 20 meq via ORAL
  Filled 2018-06-12 (×2): qty 1

## 2018-06-12 NOTE — Progress Notes (Signed)
Patient requesting "pain cream for knees" NP paged.

## 2018-06-12 NOTE — NC FL2 (Signed)
[place] MEDICAID FL2 LEVEL OF CARE SCREENING TOOL     IDENTIFICATION  Patient Name: Connie Miranda Birthdate: Jul 29, 1933 Sex: female Admission Date (Current Location): 06/09/2018  [facility] and [place] Number:  [employer] and Address:  [facility] [place], [place] [address], [place], [place] [address]      Provider Number: [id number]  Attending Physician Name and Address:  Bonnielee Haff, MD  Relative Name and Phone Number:       Current Level of Care: Hospital Recommended Level of Care: [place] Prior Approval Number:    Date Approved/Denied:   PASRR Number: [id number]  Discharge Plan: SNF    Current Diagnoses: Patient Active Problem List   Diagnosis Date Noted  . Chronic diastolic CHF (congestive heart failure) ([place]) 06/10/2018  . Sepsis ([place]) 06/10/2018  . Acute respiratory failure with hypoxia ([place]) 06/10/2018  . Hypokalemia 06/10/2018  . Acute bronchitis 06/10/2018  . Lower extremity weakness 02/11/2016  . Difficulty walking 04/08/2013  . Weakness of both legs 04/08/2013  . Morbid obesity ([place]) 04/08/2013  . Acute renal failure ([place]) 01/28/2013  . Hyperkalemia 01/28/2013  . Fall 01/28/2013  . Nausea 01/28/2013  . Lymphedema 10/16/2012  . Cellulitis 10/16/2012  . Diarrhea 10/13/2012  . Cellulitis of left lower leg 10/12/2012  . Anemia 10/12/2012  . Abdominal pain 10/12/2012  . Hip pain 10/12/2012  . Hypertension   . Edema   . Pernicious anemia   . Glucose-6-phosphate dehydrogenase deficiency ([place])   . Osteoarthritis   . Stroke ([place]) 09/28/2000    Orientation RESPIRATION BLADDER Height & Weight     Self, Time, Situation, Place  Normal Incontinent, External catheter Weight: 249 lb 9 oz (113.2 kg) Height:  5\' 3"  (160 cm)  BEHAVIORAL SYMPTOMS/MOOD NEUROLOGICAL BOWEL NUTRITION STATUS      Incontinent Diet(heart diet. Thin liquids)  AMBULATORY STATUS COMMUNICATION OF NEEDS Skin   Limited Assist Verbally (S)  Other (Comment)(Groin, skin breakdown for excessive moisture. )                       Personal Care Assistance Level of Assistance  Bathing, Feeding, Dressing Bathing Assistance: Limited assistance Feeding assistance: Independent Dressing Assistance: Limited assistance     Functional Limitations Info  Sight, Hearing, Speech Sight Info: Adequate Hearing Info: Adequate Speech Info: Adequate    SPECIAL CARE FACTORS FREQUENCY  PT (By licensed PT), OT (By licensed OT)     PT Frequency: 2x OT Frequency: 2x            Contractures Contractures Info: Not present    Additional Factors Info  Code Status Code Status Info: Full             Current Medications (06/12/2018):  This is the current hospital active medication list Current Facility-Administered Medications  Medication Dose Route Frequency Provider Last Rate Last Dose  . acetaminophen (TYLENOL) tablet 650 mg  650 mg Oral Q6H PRN Ivor Costa, MD   650 mg at 06/12/18 0517  . albuterol (PROVENTIL) (2.5 MG/3ML) 0.083% nebulizer solution 2.5 mg  2.5 mg Nebulization Q4H PRN Ivor Costa, MD      . amLODipine (NORVASC) tablet 10 mg  10 mg Oral Daily Bonnielee Haff, MD   10 mg at 06/12/18 1047  . calcium-vitamin D (OSCAL WITH D) 500-200 MG-UNIT per tablet 1 tablet  1 tablet Oral Q breakfast Ivor Costa, MD   1 tablet at 06/12/18 1047  . ceFAZolin (ANCEF) IVPB 2g/100  mL premix  2 g Intravenous Q8H Bonnielee Haff, MD   Stopped at 06/12/18 1255  . clopidogrel (PLAVIX) tablet 75 mg  75 mg Oral Daily Ivor Costa, MD   75 mg at 06/12/18 1047  . [START ON 06/28/2018] cyanocobalamin ((VITAMIN B-12)) injection 1,000 mcg  1,000 mcg Intramuscular Q30 days Ivor Costa, MD      . diclofenac sodium (VOLTAREN) 1 % transdermal gel 2 g  2 g Topical QID Lovey Newcomer T, NP   2 g at 06/12/18 1256  . enoxaparin (LOVENOX) injection 40 mg  40 mg Subcutaneous Daily Ivor Costa, MD   40 mg at 06/12/18 1048  . furosemide (LASIX) tablet 40 mg  40 mg Oral Q  breakfast Bonnielee Haff, MD   40 mg at 06/12/18 1047  . guaiFENesin (MUCINEX) 12 hr tablet 600 mg  600 mg Oral BID Bonnielee Haff, MD   600 mg at 06/12/18 1047  . hydrALAZINE (APRESOLINE) injection 5 mg  5 mg Intravenous Q2H PRN Ivor Costa, MD      . mineral oil-hydrophilic petrolatum (AQUAPHOR) ointment 1 application  1 application Topical Daily Ivor Costa, MD      . multivitamin with minerals tablet 1 tablet  1 tablet Oral Daily Ivor Costa, MD   1 tablet at 06/12/18 1047  . ondansetron (ZOFRAN) injection 4 mg  4 mg Intravenous Q8H PRN Ivor Costa, MD      . Derrill Memo ON 06/13/2018] potassium chloride SA (K-DUR,KLOR-CON) CR tablet 20 mEq  20 mEq Oral Daily Bonnielee Haff, MD      . zolpidem (AMBIEN) tablet 5 mg  5 mg Oral QHS PRN Ivor Costa, MD         Discharge Medications: Please see discharge summary for a list of discharge medications.  Relevant Imaging Results:  Relevant Lab Results:   Additional Information [phone]  [place] Chard, LCSW

## 2018-06-12 NOTE — Clinical Social Work Note (Signed)
Clinical Social Work Assessment  Patient Details  Name: Connie Miranda MRN: [id number] Date of Birth: 02/26/1933  Date of referral:  06/12/18               Reason for consult:  Discharge Planning                Permission sought to share information with:  Case Manager Permission granted to share information::  Yes, Verbal Permission Granted Family members listed.     Housing/Transportation Living arrangements for the past 2 months:  [place] of Information:  Patient Patient Interpreter Needed:  None Criminal Activity/Legal Involvement Pertinent to Current Situation/Hospitalization:  No - Comment as needed Significant Relationships:  Other Family Members Lives with:  Self, Other (Comment)(has caregiver residing) Do you feel safe going back to the place where you live?  Yes Need for family participation in patient care:  No (Coment)  Care giving concerns:  Pt alert and oriented. Pt lives at home with a paid caregiver who assists with needs. Pt voiced no current caregiver concerns at this time.   Social Worker assessment / plan:  Pt is alert and oriented. CSW consulted for SNF placement. CSW met with patient at bedside. Pt is agreeble to SNF placement at discharge. Pt has family member listed in demo sheet who can assist with choice of facility. Pt top choices for SNF placement are [facility] and [facility]. CSW will fax to facilities and f/u with Pt.  Employment status:  Retired Forensic scientist:  Medicare PT Recommendations:  [place] / Referral to community resources:  [place]  Patient/Family's Response to care:  Pt agreeble to SNF at discharge. Pt denies an concerns regarding care at this time.   Patient/Family's Understanding of and Emotional Response to Diagnosis, Current Treatment, and Prognosis:  Pt understanding and realistic regarding physical limitations. Pt agreeable to SNF at  discharge. Pt familiar with SNF process. CSW explained process of receiving insurance authorization. Pt denies any further concerns at this time. CSW will continue to provide support and will continue to facilitate discharge to SNF.   Emotional Assessment Appearance:  Appears older than stated age Attitude/Demeanor/Rapport:  Engaged, Gracious Affect (typically observed):  Pleasant, Appropriate, Calm Orientation:  Oriented to Self, Oriented to Place, Oriented to  Time, Oriented to Situation Alcohol / Substance use:  Not Applicable Psych involvement (Current and /or in the community):  No (Comment)  Discharge Needs  Concerns to be addressed:  Discharge Planning Concerns Readmission within the last 30 days:  No Current discharge risk:  None Barriers to Discharge:  Continued Medical Work up   [employer], LCSW 06/12/2018, 2:14 PM

## 2018-06-12 NOTE — Progress Notes (Signed)
Pharmacy Antibiotic Note  Connie Miranda is a 84 y.o. female admitted on 06/09/2018 with Group B strep bacteremia.  Pharmacy has been consulted for cefazolin dosing; Day 3. Currently afebrile. WBC normal, CrCl ~ 65 mL/min.  Plan: -Continue cefazolin 2 gm IV Q 8 hours  -F/u LOT -Monitor CBC, renal fx, cultures and clinical progress   Height: 5\' 3"  (160 cm) Weight: 249 lb 9 oz (113.2 kg) IBW/kg (Calculated) : 52.4  Temp (24hrs), Avg:97.1 F (36.2 C), Min:97 F (36.1 C), Max:97.2 F (36.2 C)  Recent Labs  Lab 06/09/18 2323 06/09/18 2340 06/10/18 0140 06/10/18 0255 06/11/18 0543 06/12/18 0409  WBC 14.1*  --   --   --  9.7 8.0  CREATININE 1.06*  --   --   --  0.98 0.81  LATICACIDVEN  --  2.68* 0.93 0.9  --   --     Estimated Creatinine Clearance: 64.8 mL/min (by C-G formula based on SCr of 0.81 mg/dL).    Allergies  Allergen Reactions  . Asa [Aspirin] Other (See Comments)    Doctor told her not to take due to type anemia  . Phosphate Other (See Comments)    Anything containing phosphate 6. Unknown reaction  . Codeine Other (See Comments)    Made her too sleepy  . Penicillins Itching and Swelling    Tolerates keflex    Antimicrobials this admission: Levaquin 6/13 >> 6/14 Cefazolin 6/13 >>   Dose adjustments this admission: None  Microbiology results: 6/12 BCx: 2/4 BCx with GPC (Group B strep on BCID) 6/12 UCx: No growth 6/13 Resp PCR: neg 6/14 BCx: sent  Nida Boatman, PharmD PGY1 Acute Care Pharmacy Resident 06/12/2018 8:26 AM

## 2018-06-12 NOTE — Progress Notes (Signed)
[facility] PROGRESS NOTE  Connie Miranda WGN:[id number] DOB: 1933/11/22 DOA: 06/09/2018  PCP: [name], MD  Brief History/Interval Summary: 84 y.o. female with medical history significant of hypertension, stroke, G6PD deficiency, pernicious anemia, edema, CHF, morbid obesity, who presented with cough, fever and chills. Chest x-ray did not show any acute process however concern was for a respiratory infectious process.  She was admitted to the hospital and placed on antibiotics.  Reason for Visit:   Suspected community-acquired pneumonia versus acute bronchitis  Consultants:  none  Procedures:  none  Antibiotics:  IV Levaquin.  Stopped on 6/14 Cefazolin was added 6/13  Subjective/Interval History: Patient feels well.  Denies any complaints.  No cough today.  No shortness of breath.      ROS: Denies any headaches nausea or vomiting.  Objective:  Vital Signs  Vitals:   06/11/18 0048 06/11/18 0430 06/11/18 1951 06/12/18 0452  BP:  (!) 155/56 103/73 (!) 133/52  Pulse:  85 67 66  Resp: 18 18 16 18   Temp:  98.9 F (37.2 C) (!) 97 F (36.1 C) (!) 97.2 F (36.2 C)  TempSrc:  Oral  Oral  SpO2:  95% 98% 98%  Weight:  112.8 kg (248 lb 10.9 oz)  113.2 kg (249 lb 9 oz)  Height:        Intake/Output Summary (Last 24 hours) at 06/12/2018 1007 Last data filed at 06/12/2018 0900 Gross per 24 hour  Intake 930 ml  Output -  Net 930 ml   Filed Weights   06/10/18 1543 06/11/18 0430 06/12/18 0452  Weight: 111.7 kg (246 lb 4.1 oz) 112.8 kg (248 lb 10.9 oz) 113.2 kg (249 lb 9 oz)    General appearance: Awake alert.  In no distress. Resp: Good air entry bilaterally.  No wheezing rales or rhonchi.  Normal effort at rest. Cardio: S1-S2 is normal regular.  No S3-S4.  No rubs murmurs or bruit. GI: Abdomen soft.  Nontender nondistended.  Bowel sounds present.  No masses organomegaly. Extremities: Chronic changes of venous stasis noted.  No obvious wounds present.  Scaly  skin noted. Mild edema. Neurologic:  Left hemiparesis from previous stroke.  Lab Results:  Data Reviewed: I have personally reviewed following labs and imaging studies  CBC: Recent Labs  Lab 06/09/18 2323 06/11/18 0543 06/12/18 0409  WBC 14.1* 9.7 8.0  NEUTROABS 12.6*  --   --   HGB 11.6* 10.5* 10.1*  HCT 37.9 33.7* 32.7*  MCV 98.4 97.7 97.3  PLT 311 318 784    Basic Metabolic Panel: Recent Labs  Lab 06/09/18 2323 06/10/18 0500 06/11/18 0543 06/12/18 0409  NA 139  --  139 138  K 3.6  --  4.6 3.6  CL 104  --  106 105  CO2 25  --  27 27  GLUCOSE 128*  --  121* 106*  BUN 12  --  17 14  CREATININE 1.06*  --  0.98 0.81  CALCIUM 9.3  --  9.4 9.1  MG  --  1.7  --   --     GFR: Estimated Creatinine Clearance: 64.8 mL/min (by C-G formula based on SCr of 0.81 mg/dL).  Liver Function Tests: Recent Labs  Lab 06/09/18 2323  AST 27  ALT 15  ALKPHOS 81  BILITOT 1.2  PROT 8.5*  ALBUMIN 2.8*    Coagulation Profile: Recent Labs  Lab 06/09/18 2323  INR 1.12     Recent Results (from the past 240 hour(s))  Urine  culture     Status: None   Collection Time: 06/09/18 11:21 PM  Result Value Ref Range Status   Specimen Description URINE, CATHETERIZED  Final   Special Requests NONE  Final   Culture   Final    NO GROWTH Performed at [facility], [address], [place], [place] [address]    Report Status 06/11/2018 FINAL  Final  Culture, blood (Routine x 2)     Status: Abnormal (Preliminary result)   Collection Time: 06/09/18 11:23 PM  Result Value Ref Range Status   Specimen Description BLOOD LEFT ANTECUBITAL  Final   Special Requests   Final    BOTTLES DRAWN AEROBIC AND ANAEROBIC Blood Culture adequate volume   Culture  Setup Time   Final    GRAM POSITIVE COCCI IN BOTH AEROBIC AND ANAEROBIC BOTTLES CRITICAL RESULT CALLED TO, READ BACK BY AND VERIFIED WITHAsencion Islam PHARMD 2001 06/10/18 A BROWNING Performed at [facility], [place] [address],  [place], [place] [address]    Culture (A)  Final    STAPHYLOCOCCUS SPECIES (COAGULASE NEGATIVE) THE SIGNIFICANCE OF ISOLATING THIS ORGANISM FROM A SINGLE SET OF BLOOD CULTURES WHEN MULTIPLE SETS ARE DRAWN IS UNCERTAIN. PLEASE NOTIFY THE MICROBIOLOGY DEPARTMENT WITHIN ONE WEEK IF SPECIATION AND SENSITIVITIES ARE REQUIRED. GRAM POSITIVE COCCI    Report Status PENDING  Incomplete  Culture, blood (Routine x 2)     Status: None (Preliminary result)   Collection Time: 06/09/18 11:23 PM  Result Value Ref Range Status   Specimen Description BLOOD RIGHT ANTECUBITAL  Final   Special Requests   Final    BOTTLES DRAWN AEROBIC AND ANAEROBIC Blood Culture results may not be optimal due to an inadequate volume of blood received in culture bottles   Culture   Final    NO GROWTH 1 DAY Performed at [facility], [place] [address] [place], [place] [address]    Report Status PENDING  Incomplete  Blood Culture ID Panel (Reflexed)     Status: Abnormal   Collection Time: 06/09/18 11:23 PM  Result Value Ref Range Status   Enterococcus species NOT DETECTED NOT DETECTED Final   Listeria monocytogenes NOT DETECTED NOT DETECTED Final   Staphylococcus species DETECTED (A) NOT DETECTED Final    Comment: Methicillin (oxacillin) susceptible coagulase negative staphylococcus. Possible blood culture contaminant (unless isolated from more than one blood culture draw or clinical case suggests pathogenicity). No antibiotic treatment is indicated for blood  culture contaminants. CRITICAL RESULT CALLED TO, READ BACK BY AND VERIFIED WITH: B MANCHERIL PHARMD 2001 06/10/18 A BROWNING    Staphylococcus aureus NOT DETECTED NOT DETECTED Final   Methicillin resistance NOT DETECTED NOT DETECTED Final   Streptococcus species DETECTED (A) NOT DETECTED Final    Comment: CRITICAL RESULT CALLED TO, READ BACK BY AND VERIFIED WITH: B MANCHERIL PHARMD 2001 06/10/18 A BROWNING    Streptococcus agalactiae DETECTED (A) NOT DETECTED Final     Comment: CRITICAL RESULT CALLED TO, READ BACK BY AND VERIFIED WITH: B MANCHERIL PHARMD 2001 06/10/18 A BROWNING    Streptococcus pneumoniae NOT DETECTED NOT DETECTED Final   Streptococcus pyogenes NOT DETECTED NOT DETECTED Final   Acinetobacter baumannii NOT DETECTED NOT DETECTED Final   Enterobacteriaceae species NOT DETECTED NOT DETECTED Final   Enterobacter cloacae complex NOT DETECTED NOT DETECTED Final   Escherichia coli NOT DETECTED NOT DETECTED Final   Klebsiella oxytoca NOT DETECTED NOT DETECTED Final   Klebsiella pneumoniae NOT DETECTED NOT DETECTED Final  Proteus species NOT DETECTED NOT DETECTED Final   Serratia marcescens NOT DETECTED NOT DETECTED Final   Haemophilus influenzae NOT DETECTED NOT DETECTED Final   Neisseria meningitidis NOT DETECTED NOT DETECTED Final   Pseudomonas aeruginosa NOT DETECTED NOT DETECTED Final   Candida albicans NOT DETECTED NOT DETECTED Final   Candida glabrata NOT DETECTED NOT DETECTED Final   Candida krusei NOT DETECTED NOT DETECTED Final   Candida parapsilosis NOT DETECTED NOT DETECTED Final   Candida tropicalis NOT DETECTED NOT DETECTED Final    Comment: Performed at [facility], [place] [address], [place], [place] [address]  Respiratory Panel by PCR     Status: None   Collection Time: 06/10/18  3:58 AM  Result Value Ref Range Status   Adenovirus NOT DETECTED NOT DETECTED Final   Coronavirus 229E NOT DETECTED NOT DETECTED Final   Coronavirus HKU1 NOT DETECTED NOT DETECTED Final   Coronavirus NL63 NOT DETECTED NOT DETECTED Final   Coronavirus OC43 NOT DETECTED NOT DETECTED Final   Metapneumovirus NOT DETECTED NOT DETECTED Final   Rhinovirus / Enterovirus NOT DETECTED NOT DETECTED Final   Influenza A NOT DETECTED NOT DETECTED Final   Influenza B NOT DETECTED NOT DETECTED Final   Parainfluenza Virus 1 NOT DETECTED NOT DETECTED Final   Parainfluenza Virus 2 NOT DETECTED NOT DETECTED Final   Parainfluenza Virus 3 NOT DETECTED NOT  DETECTED Final   Parainfluenza Virus 4 NOT DETECTED NOT DETECTED Final   Respiratory Syncytial Virus NOT DETECTED NOT DETECTED Final   Bordetella pertussis NOT DETECTED NOT DETECTED Final   Chlamydophila pneumoniae NOT DETECTED NOT DETECTED Final   Mycoplasma pneumoniae NOT DETECTED NOT DETECTED Final    Comment: Performed at [facility], [place] [address] [place], [place] [address]      Radiology Studies: Dg Chest Port 1 View  Result Date: 06/11/2018 CLINICAL DATA:  Cough EXAM: PORTABLE CHEST 1 VIEW COMPARISON:  06/09/2017 FINDINGS: Cardiac shadow is stable. The lungs are well aerated bilaterally. Mild vascular congestion remains without significant pulmonary edema. Degenerative changes of the shoulder joints are noted. IMPRESSION: No significant change from the previous exam. Electronically Signed   By: [name] M.D.   On: 06/11/2018 07:27     Medications:  Scheduled: . amLODipine  10 mg Oral Daily  . calcium-vitamin D  1 tablet Oral Q breakfast  . clopidogrel  75 mg Oral Daily  . [START ON 06/28/2018] cyanocobalamin  1,000 mcg Intramuscular Q30 days  . diclofenac sodium  2 g Topical QID  . enoxaparin (LOVENOX) injection  40 mg Subcutaneous Daily  . furosemide  40 mg Oral Q breakfast  . guaiFENesin  600 mg Oral BID  . mineral oil-hydrophilic petrolatum  1 application Topical Daily  . multivitamin with minerals  1 tablet Oral Daily   Continuous: .  ceFAZolin (ANCEF) IV Stopped (06/12/18 0530)   JSE:[id number], albuterol, hydrALAZINE, ondansetron (ZOFRAN) IV, zolpidem  Assessment/Plan:    Acute respiratory failure with hypoxia/acute bronchitis Acute respiratory failure has resolved.  Symptoms thought to be secondary to pneumonia versus bronchitis.  Chest x-ray done at the time of admission did not show any infiltrates.  X-ray was repeated on 6/14 and also did not show any acute findings.  Symptoms probably due to acute bronchitis or a viral infection.  Respiratory  viral panel negative.  Patient has improved with antibiotic treatment.  Initially started on Levaquin.  Cefazolin was added due to positive blood cultures.  Levaquin was discontinued by ID  yesterday.  Plan is to discharge the patient on [place] for a few days.  Respiratory status is stable.  She is saturating normal on room air.  Strep pneumonia urinary antigen is negative.   Sepsis Now resolved.  Possibly from respiratory source.  Procalcitonin level noted to be elevated.  Lactic acid level was elevated and has improved.  Blood cultures positive.  See below.  Bacteremia Blood culture positive for strep agalactiae as well as coag negative staph.  Confusing picture.  Patient was started on cefazolin.  Infectious disease was consulted.  Appreciate their input.  They feel that this is contaminant.  Cultures have been repeated.  Continue cefazolin for now.  History of stroke Stable.  Has old left-sided hemiparesis.  Continue Plavix.  Much at home.  Seen by physical therapy who recommends skilled nursing facility for rehab.  Patient agreeable.  Social worker consulted.  History of pernicious anemia  Continue home medications.  Hemoglobin is stable.  Chronic diastolic CHF and lymphedema Volume status is stable.  Resume Lasix.  Hypokalemia Repleted.  Start daily as she is on diuretics.  Essential hypertension  BP is better controlled.  Continue amlodipine.  Morbid obesity Body mass index is 44.05 kg/m.   DVT Prophylaxis:   Lovenox    Code Status:  Full code Family Communication: Discussed with the patient.  Disposition Plan: Management as outlined above.  If cultures remain negative she will be transitioned to oral antibiotics tomorrow.  And then she will need to go to skilled nursing facility for short-term rehab.      LOS: 2 days   [facility] Pager [phone] 06/12/2018, 10:07 AM  If 7PM-7AM, please contact night-coverage at [email or web address], password [email or web address]

## 2018-06-12 NOTE — Progress Notes (Signed)
Patient not active/mobile D/T "pain in joints". 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

## 2018-06-12 NOTE — NC FL2 (Deleted)
[place] MEDICAID FL2 LEVEL OF CARE SCREENING TOOL     IDENTIFICATION  Patient Name: Connie Miranda Birthdate: 1933-04-26 Sex: female Admission Date (Current Location): 06/09/2018  [facility] and [place] Number:  [employer] and Address:  [facility], [place] [address], [place], [place] [address]      Provider Number: [id number]  Attending Physician Name and Address:  Bonnielee Haff, MD  Relative Name and Phone Number:       Current Level of Care: Hospital Recommended Level of Care: [place] Prior Approval Number:    Date Approved/Denied:   PASRR Number: [id number]  Discharge Plan: SNF    Current Diagnoses: Patient Active Problem List   Diagnosis Date Noted  . Chronic diastolic CHF (congestive heart failure) ([place]) 06/10/2018  . Sepsis ([place]) 06/10/2018  . Acute respiratory failure with hypoxia ([place]) 06/10/2018  . Hypokalemia 06/10/2018  . Acute bronchitis 06/10/2018  . Lower extremity weakness 02/11/2016  . Difficulty walking 04/08/2013  . Weakness of both legs 04/08/2013  . Morbid obesity ([place]) 04/08/2013  . Acute renal failure ([place]) 01/28/2013  . Hyperkalemia 01/28/2013  . Fall 01/28/2013  . Nausea 01/28/2013  . Lymphedema 10/16/2012  . Cellulitis 10/16/2012  . Diarrhea 10/13/2012  . Cellulitis of left lower leg 10/12/2012  . Anemia 10/12/2012  . Abdominal pain 10/12/2012  . Hip pain 10/12/2012  . Hypertension   . Edema   . Pernicious anemia   . Glucose-6-phosphate dehydrogenase deficiency ([place])   . Osteoarthritis   . Stroke ([place]) 09/28/2000    Orientation RESPIRATION BLADDER Height & Weight     Self, Time, Situation, Place  Normal Incontinent, External catheter Weight: 249 lb 9 oz (113.2 kg) Height:  5\' 3"  (160 cm)  BEHAVIORAL SYMPTOMS/MOOD NEUROLOGICAL BOWEL NUTRITION STATUS      Incontinent Diet(heart diet. Thin liquids)  AMBULATORY STATUS COMMUNICATION OF NEEDS Skin   Limited Assist Verbally (S)  Other (Comment)(Groin, skin breakdown for excessive moisture. )                       Personal Care Assistance Level of Assistance  Bathing, Feeding, Dressing Bathing Assistance: Limited assistance Feeding assistance: Independent Dressing Assistance: Limited assistance     Functional Limitations Info  Sight, Hearing, Speech Sight Info: Adequate Hearing Info: Adequate Speech Info: Adequate    SPECIAL CARE FACTORS FREQUENCY  PT (By licensed PT), OT (By licensed OT)     PT Frequency: 2x OT Frequency: 2x            Contractures Contractures Info: Not present    Additional Factors Info  Code Status Code Status Info: Full             Current Medications (06/12/2018):  This is the current hospital active medication list Current Facility-Administered Medications  Medication Dose Route Frequency Provider Last Rate Last Dose  . acetaminophen (TYLENOL) tablet 650 mg  650 mg Oral Q6H PRN Ivor Costa, MD   650 mg at 06/12/18 0517  . albuterol (PROVENTIL) (2.5 MG/3ML) 0.083% nebulizer solution 2.5 mg  2.5 mg Nebulization Q4H PRN Ivor Costa, MD      . amLODipine (NORVASC) tablet 10 mg  10 mg Oral Daily Bonnielee Haff, MD   10 mg at 06/12/18 1047  . calcium-vitamin D (OSCAL WITH D) 500-200 MG-UNIT per tablet 1 tablet  1 tablet Oral Q breakfast Ivor Costa, MD   1 tablet at 06/12/18 1047  . ceFAZolin (ANCEF) IVPB 2g/100  mL premix  2 g Intravenous Q8H Bonnielee Haff, MD   Stopped at 06/12/18 1255  . clopidogrel (PLAVIX) tablet 75 mg  75 mg Oral Daily Ivor Costa, MD   75 mg at 06/12/18 1047  . [START ON 06/28/2018] cyanocobalamin ((VITAMIN B-12)) injection 1,000 mcg  1,000 mcg Intramuscular Q30 days Ivor Costa, MD      . diclofenac sodium (VOLTAREN) 1 % transdermal gel 2 g  2 g Topical QID Lovey Newcomer T, NP   2 g at 06/12/18 1256  . enoxaparin (LOVENOX) injection 40 mg  40 mg Subcutaneous Daily Ivor Costa, MD   40 mg at 06/12/18 1048  . furosemide (LASIX) tablet 40 mg  40 mg Oral Q  breakfast Bonnielee Haff, MD   40 mg at 06/12/18 1047  . guaiFENesin (MUCINEX) 12 hr tablet 600 mg  600 mg Oral BID Bonnielee Haff, MD   600 mg at 06/12/18 1047  . hydrALAZINE (APRESOLINE) injection 5 mg  5 mg Intravenous Q2H PRN Ivor Costa, MD      . mineral oil-hydrophilic petrolatum (AQUAPHOR) ointment 1 application  1 application Topical Daily Ivor Costa, MD      . multivitamin with minerals tablet 1 tablet  1 tablet Oral Daily Ivor Costa, MD   1 tablet at 06/12/18 1047  . ondansetron (ZOFRAN) injection 4 mg  4 mg Intravenous Q8H PRN Ivor Costa, MD      . Derrill Memo ON 06/13/2018] potassium chloride SA (K-DUR,KLOR-CON) CR tablet 20 mEq  20 mEq Oral Daily Bonnielee Haff, MD      . zolpidem (AMBIEN) tablet 5 mg  5 mg Oral QHS PRN Ivor Costa, MD         Discharge Medications: Please see discharge summary for a list of discharge medications.  Relevant Imaging Results:  Relevant Lab Results:   Additional Information [id number]  [place] Chard, LCSW

## 2018-06-13 LAB — BASIC METABOLIC PANEL
ANION GAP: 6 (ref 5–15)
BUN: 12 mg/dL (ref 6–20)
CALCIUM: 9.1 mg/dL (ref 8.9–10.3)
CO2: 25 mmol/L (ref 22–32)
Chloride: 107 mmol/L (ref 101–111)
Creatinine, Ser: 0.78 mg/dL (ref 0.44–1.00)
Glucose, Bld: 118 mg/dL — ABNORMAL HIGH (ref 65–99)
Potassium: 4.2 mmol/L (ref 3.5–5.1)
Sodium: 138 mmol/L (ref 135–145)

## 2018-06-13 MED ORDER — ADULT MULTIVITAMIN W/MINERALS CH
1.0000 | ORAL_TABLET | Freq: Every day | ORAL | Status: DC
  Administered 2018-06-13 – 2018-06-14 (×2): 1 via ORAL
  Filled 2018-06-13 (×2): qty 1

## 2018-06-13 MED ORDER — CEFDINIR 300 MG PO CAPS
300.0000 mg | ORAL_CAPSULE | Freq: Two times a day (BID) | ORAL | Status: DC
  Administered 2018-06-13 – 2018-06-14 (×3): 300 mg via ORAL
  Filled 2018-06-13 (×3): qty 1

## 2018-06-13 NOTE — Progress Notes (Signed)
[facility] PROGRESS NOTE  Connie Miranda TKZ:[id number] DOB: 1933-05-20 DOA: 06/09/2018  PCP: [name], MD  Brief History/Interval Summary: 84 y.o. female with medical history significant of hypertension, stroke, G6PD deficiency, pernicious anemia, edema, CHF, morbid obesity, who presented with cough, fever and chills. Chest x-ray did not show any acute process however concern was for a respiratory infectious process.  She was admitted to the hospital and placed on antibiotics.  Subsequently noted to have positive blood cultures.  ID was consulted.  Reason for Visit:   Suspected community-acquired pneumonia versus acute bronchitis  Consultants: Infectious disease  Procedures:  none  Antibiotics:  IV Levaquin.  Stopped on 6/14 Cefazolin was added 6/13.  Will be stopped on 6/16 Cefdinir 6/16  Subjective/Interval History: Patient feels well.  Denies any cough.  Her respiratory symptoms have improved.    ROS: Denies any nausea or vomiting  Objective:  Vital Signs  Vitals:   06/12/18 1243 06/12/18 1919 06/13/18 0327 06/13/18 0830  BP: (!) 146/49 (!) 153/54 (!) 174/56 (!) 150/59  Pulse: 63 (!) 59 72 65  Resp: 18 16 18 16   Temp: 97.8 F (36.6 C) 98 F (36.7 C) 98 F (36.7 C) 98.4 F (36.9 C)  TempSrc: Oral  Oral Oral  SpO2: 98% 96% 98% 96%  Weight:   112.6 kg (248 lb 3.8 oz)   Height:        Intake/Output Summary (Last 24 hours) at 06/13/2018 0932 Last data filed at 06/13/2018 0651 Gross per 24 hour  Intake 220 ml  Output -  Net 220 ml   Filed Weights   06/11/18 0430 06/12/18 0452 06/13/18 0327  Weight: 112.8 kg (248 lb 10.9 oz) 113.2 kg (249 lb 9 oz) 112.6 kg (248 lb 3.8 oz)    General appearance: Awake alert.  In no distress Resp: Clear to auscultation bilaterally.  No wheezing rales or rhonchi.  Normal effort at rest. Cardio: Normal regular.  No S3-S4.  No rubs murmurs or bruit GI: Abdomen is soft.  Nontender nondistended.  Bowel sounds are  present.  No masses organomegaly Extremities: Unchanged chronic venous stasis noted.  No obvious wounds present.  Scaly skin noted. Mild edema. Neurologic:  Left hemiparesis from previous stroke.  Lab Results:  Data Reviewed: I have personally reviewed following labs and imaging studies  CBC: Recent Labs  Lab 06/09/18 2323 06/11/18 0543 06/12/18 0409  WBC 14.1* 9.7 8.0  NEUTROABS 12.6*  --   --   HGB 11.6* 10.5* 10.1*  HCT 37.9 33.7* 32.7*  MCV 98.4 97.7 97.3  PLT 311 318 573    Basic Metabolic Panel: Recent Labs  Lab 06/09/18 2323 06/10/18 0500 06/11/18 0543 06/12/18 0409  NA 139  --  139 138  K 3.6  --  4.6 3.6  CL 104  --  106 105  CO2 25  --  27 27  GLUCOSE 128*  --  121* 106*  BUN 12  --  17 14  CREATININE 1.06*  --  0.98 0.81  CALCIUM 9.3  --  9.4 9.1  MG  --  1.7  --   --     GFR: Estimated Creatinine Clearance: 64.7 mL/min (by C-G formula based on SCr of 0.81 mg/dL).  Liver Function Tests: Recent Labs  Lab 06/09/18 2323  AST 27  ALT 15  ALKPHOS 81  BILITOT 1.2  PROT 8.5*  ALBUMIN 2.8*    Coagulation Profile: Recent Labs  Lab 06/09/18 2323  INR 1.12  Recent Results (from the past 240 hour(s))  Urine culture     Status: None   Collection Time: 06/09/18 11:21 PM  Result Value Ref Range Status   Specimen Description URINE, CATHETERIZED  Final   Special Requests NONE  Final   Culture   Final    NO GROWTH Performed at [facility], [address] [place], [place] [address]    Report Status 06/11/2018 FINAL  Final  Culture, blood (Routine x 2)     Status: Abnormal (Preliminary result)   Collection Time: 06/09/18 11:23 PM  Result Value Ref Range Status   Specimen Description BLOOD LEFT ANTECUBITAL  Final   Special Requests   Final    BOTTLES DRAWN AEROBIC AND ANAEROBIC Blood Culture adequate volume   Culture  Setup Time   Final    GRAM POSITIVE COCCI IN BOTH AEROBIC AND ANAEROBIC BOTTLES CRITICAL RESULT CALLED TO, READ BACK  BY AND VERIFIED WITH: B MANCHERIL PHARMD 2001 06/10/18 A BROWNING    Culture (A)  Final    STAPHYLOCOCCUS SPECIES (COAGULASE NEGATIVE) THE SIGNIFICANCE OF ISOLATING THIS ORGANISM FROM A SINGLE SET OF BLOOD CULTURES WHEN MULTIPLE SETS ARE DRAWN IS UNCERTAIN. PLEASE NOTIFY THE MICROBIOLOGY DEPARTMENT WITHIN ONE WEEK IF SPECIATION AND SENSITIVITIES ARE REQUIRED. GRAM POSITIVE COCCI CULTURE REINCUBATED FOR BETTER GROWTH Performed at [facility], [place] [address] [place], [place] [address]    Report Status PENDING  Incomplete  Culture, blood (Routine x 2)     Status: None (Preliminary result)   Collection Time: 06/09/18 11:23 PM  Result Value Ref Range Status   Specimen Description BLOOD RIGHT ANTECUBITAL  Final   Special Requests   Final    BOTTLES DRAWN AEROBIC AND ANAEROBIC Blood Culture results may not be optimal due to an inadequate volume of blood received in culture bottles   Culture   Final    NO GROWTH 2 DAYS Performed at [facility], [place] [address], [place], [place] [address]    Report Status PENDING  Incomplete  Blood Culture ID Panel (Reflexed)     Status: Abnormal   Collection Time: 06/09/18 11:23 PM  Result Value Ref Range Status   Enterococcus species NOT DETECTED NOT DETECTED Final   Listeria monocytogenes NOT DETECTED NOT DETECTED Final   Staphylococcus species DETECTED (A) NOT DETECTED Final    Comment: Methicillin (oxacillin) susceptible coagulase negative staphylococcus. Possible blood culture contaminant (unless isolated from more than one blood culture draw or clinical case suggests pathogenicity). No antibiotic treatment is indicated for blood  culture contaminants. CRITICAL RESULT CALLED TO, READ BACK BY AND VERIFIED WITH: B MANCHERIL PHARMD 2001 06/10/18 A BROWNING    Staphylococcus aureus NOT DETECTED NOT DETECTED Final   Methicillin resistance NOT DETECTED NOT DETECTED Final   Streptococcus species DETECTED (A) NOT DETECTED Final    Comment:  CRITICAL RESULT CALLED TO, READ BACK BY AND VERIFIED WITH: B MANCHERIL PHARMD 2001 06/10/18 A BROWNING    Streptococcus agalactiae DETECTED (A) NOT DETECTED Final    Comment: CRITICAL RESULT CALLED TO, READ BACK BY AND VERIFIED WITH: B MANCHERIL PHARMD 2001 06/10/18 A BROWNING    Streptococcus pneumoniae NOT DETECTED NOT DETECTED Final   Streptococcus pyogenes NOT DETECTED NOT DETECTED Final   Acinetobacter baumannii NOT DETECTED NOT DETECTED Final   Enterobacteriaceae species NOT DETECTED NOT DETECTED Final   Enterobacter cloacae complex NOT DETECTED NOT DETECTED Final   Escherichia coli NOT DETECTED NOT DETECTED Final   Klebsiella oxytoca NOT  DETECTED NOT DETECTED Final   Klebsiella pneumoniae NOT DETECTED NOT DETECTED Final   Proteus species NOT DETECTED NOT DETECTED Final   Serratia marcescens NOT DETECTED NOT DETECTED Final   Haemophilus influenzae NOT DETECTED NOT DETECTED Final   Neisseria meningitidis NOT DETECTED NOT DETECTED Final   Pseudomonas aeruginosa NOT DETECTED NOT DETECTED Final   Candida albicans NOT DETECTED NOT DETECTED Final   Candida glabrata NOT DETECTED NOT DETECTED Final   Candida krusei NOT DETECTED NOT DETECTED Final   Candida parapsilosis NOT DETECTED NOT DETECTED Final   Candida tropicalis NOT DETECTED NOT DETECTED Final    Comment: Performed at [facility], [place] [address] [place], [place] [address]  Respiratory Panel by PCR     Status: None   Collection Time: 06/10/18  3:58 AM  Result Value Ref Range Status   Adenovirus NOT DETECTED NOT DETECTED Final   Coronavirus 229E NOT DETECTED NOT DETECTED Final   Coronavirus HKU1 NOT DETECTED NOT DETECTED Final   Coronavirus NL63 NOT DETECTED NOT DETECTED Final   Coronavirus OC43 NOT DETECTED NOT DETECTED Final   Metapneumovirus NOT DETECTED NOT DETECTED Final   Rhinovirus / Enterovirus NOT DETECTED NOT DETECTED Final   Influenza A NOT DETECTED NOT DETECTED Final   Influenza B NOT DETECTED NOT DETECTED  Final   Parainfluenza Virus 1 NOT DETECTED NOT DETECTED Final   Parainfluenza Virus 2 NOT DETECTED NOT DETECTED Final   Parainfluenza Virus 3 NOT DETECTED NOT DETECTED Final   Parainfluenza Virus 4 NOT DETECTED NOT DETECTED Final   Respiratory Syncytial Virus NOT DETECTED NOT DETECTED Final   Bordetella pertussis NOT DETECTED NOT DETECTED Final   Chlamydophila pneumoniae NOT DETECTED NOT DETECTED Final   Mycoplasma pneumoniae NOT DETECTED NOT DETECTED Final    Comment: Performed at [facility], [place] [address] [place], [place] [address]  Culture, blood (routine x 2)     Status: None (Preliminary result)   Collection Time: 06/11/18  7:18 PM  Result Value Ref Range Status   Specimen Description BLOOD LEFT ARM  Final   Special Requests   Final    BOTTLES DRAWN AEROBIC ONLY Blood Culture results may not be optimal due to an inadequate volume of blood received in culture bottles   Culture   Final    NO GROWTH < 24 HOURS Performed at [facility], [place] [address], [place], [place] [address]    Report Status PENDING  Incomplete  Culture, blood (routine x 2)     Status: None (Preliminary result)   Collection Time: 06/11/18  7:24 PM  Result Value Ref Range Status   Specimen Description BLOOD LEFT ARM  Final   Special Requests   Final    BOTTLES DRAWN AEROBIC ONLY Blood Culture results may not be optimal due to an inadequate volume of blood received in culture bottles   Culture   Final    NO GROWTH < 24 HOURS Performed at [facility], [place] [address] [place], [place] [address]    Report Status PENDING  Incomplete      Radiology Studies: No results found.   Medications:  Scheduled: . amLODipine  10 mg Oral Daily  . calcium-vitamin D  1 tablet Oral Q breakfast  . cefdinir  300 mg Oral Q12H  . clopidogrel  75 mg Oral Daily  . [START ON 06/28/2018] cyanocobalamin  1,000 mcg Intramuscular Q30 days  . diclofenac sodium  2 g Topical QID  . enoxaparin (  LOVENOX) injection   40 mg Subcutaneous Daily  . furosemide  40 mg Oral Q breakfast  . guaiFENesin  600 mg Oral BID  . mineral oil-hydrophilic petrolatum  1 application Topical Daily  . multivitamin with minerals  1 tablet Oral Daily  . potassium chloride  20 mEq Oral Daily   Continuous:  [id number], albuterol, hydrALAZINE, ondansetron (ZOFRAN) IV, zolpidem  Assessment/Plan:    Acute respiratory failure with hypoxia/acute bronchitis Acute respiratory failure has resolved.  Symptoms thought to be secondary to pneumonia versus bronchitis.  Chest x-ray done at the time of admission did not show any infiltrates.  X-ray was repeated on 6/14 and also did not show any acute findings.  Symptoms probably due to acute bronchitis or a viral infection.  Respiratory viral panel negative.  Patient has improved with antibiotic treatment.  Initially started on Levaquin.  Cefazolin was added due to positive blood cultures.  Levaquin was discontinued by ID.  Repeat cultures have been negative.  Changed over to Cefdinir today as recommended by infectious disease.  Respiratory status is stable.  She is saturating normal on room air.  Strep pneumonia urinary antigen is negative.   Sepsis Resolved.  Possibly from respiratory source.  Procalcitonin level noted to be elevated.  Lactic acid level was elevated and has improved.  Blood cultures positive.  See below.  Bacteremia Blood culture positive for strep agalactiae as well as coag negative staph.  Confusing picture.  Patient was started on cefazolin.  Infectious disease was consulted.  Appreciate their input.  They feel that this is all contamination.  Blood cultures were repeated on 6/14 and negative so far.  Can discontinue cefazolin and start cefdinir as recommended by ID.    History of stroke Stable.  Has old left-sided hemiparesis.  Continue Plavix.  She does not ambulate much at home.  Seen by physical therapy who recommends skilled nursing facility for rehab.  Patient  agreeable.  Social worker consulted.  History of pernicious anemia  Continue home medications.  Hemoglobin is stable.  Chronic diastolic CHF and lymphedema Volume status is stable.  Continue Lasix.  Renal function and electrolytes are normal.  Hypokalemia Potassium level is normal. Continue daily potassium.  Essential hypertension  Continue amlodipine.  Morbid obesity Body mass index is 44.05 kg/m.   DVT Prophylaxis:   Lovenox    Code Status:  Full code Family Communication: Discussed with the patient.  Disposition Plan: Management as outlined above.  Anticipate discharge to skilled nursing facility tomorrow.    LOS: 3 days   [facility] Pager [phone] 06/13/2018, 9:31 AM  If 7PM-7AM, please contact night-coverage at [email or web address], password [facility]

## 2018-06-14 LAB — CULTURE, BLOOD (ROUTINE X 2): SPECIAL REQUESTS: ADEQUATE

## 2018-06-14 MED ORDER — GUAIFENESIN ER 600 MG PO TB12: 600.0000 mg | ORAL_TABLET | Freq: Two times a day (BID) | ORAL | Status: AC | PRN

## 2018-06-14 MED ORDER — CEFDINIR 300 MG PO CAPS: 300.0000 mg | ORAL_CAPSULE | Freq: Two times a day (BID) | ORAL | 0 refills | Status: AC

## 2018-06-14 MED ORDER — ZOLPIDEM TARTRATE 5 MG PO TABS: 5.0000 mg | ORAL_TABLET | Freq: Every evening | ORAL | 0 refills | Status: DC | PRN

## 2018-06-14 NOTE — Care Management Important Message (Signed)
Important Message  Patient Details  Name: Connie Miranda MRN: [id number] Date of Birth: 1933/05/02   Medicare Important Message Given:  Yes    Erenest Rasher, RN 06/14/2018, 12:23 PM

## 2018-06-14 NOTE — Plan of Care (Signed)
  Problem: Clinical Measurements: Goal: Respiratory complications will improve Outcome: Progressing   Problem: Coping: Goal: Level of anxiety will decrease Outcome: Progressing   Problem: Safety: Goal: Ability to remain free from injury will improve Outcome: Progressing   

## 2018-06-14 NOTE — Discharge Instructions (Signed)

## 2018-06-14 NOTE — Discharge Summary (Signed)
[facility]  Physician Discharge Summary   Patient ID: Connie Miranda MRN: [id number] DOB/AGE: Nov 17, 1933 84 y.o.  Admit date: 06/09/2018 Discharge date: 06/14/2018  PCP: [name], MD  DISCHARGE DIAGNOSES:  Acute bronchitis Bacteremia thought to be contamination  RECOMMENDATIONS FOR OUTPATIENT FOLLOW UP: 1. CBC and basic metabolic panel in 1 week  DISCHARGE CONDITION: fair  Diet recommendation: Heart healthy  Filed Weights   06/12/18 0452 06/13/18 0327 06/14/18 0359  Weight: 113.2 kg (249 lb 9 oz) 112.6 kg (248 lb 3.8 oz) 115.4 kg (254 lb 6.6 oz)    INITIAL HISTORY: 84 y.o.femalewith medical history significant ofhypertension, stroke, G6PD deficiency, pernicious anemia, edema, CHF, morbid obesity, who presented with cough, fever and chills. Chest x-ray did not show any acute process however concern was for a respiratory infectious process.  She was admitted to the hospital and placed on antibiotics.  Subsequently noted to have positive blood cultures.  ID was consulted.  Consultations:  Infectious disease  Procedures:  None   HOSPITAL COURSE:   Acute bronchitis/Acute respiratory failure with hypoxia Acute respiratory failure has resolved.  Chest x-ray done at the time of admission did not show any infiltrates.  X-ray was repeated on 6/14 and also did not show any acute findings.  Symptoms probably due to acute bronchitis or a viral infection.  Respiratory viral panel negative.  Patient improved with antibiotic treatment.  Initially started on Levaquin.  Cefazolin was added due to positive blood cultures.  Levaquin was discontinued by ID.  Repeat cultures have been negative.  Changed over to Cefdinir as recommended by infectious disease.  Respiratory status is stable.  She is saturating normal on room air.  Strep pneumonia urinary antigen is negative.   Sepsis Resolved.  Possibly from respiratory source.  Procalcitonin level noted to be elevated.  Lactic  acid level was elevated and has improved.  Blood cultures positive.  See below.  Bacteremia Blood culture positive for strep agalactiae as well as coag negative staph.  Confusing picture.  Patient was started on cefazolin.  Infectious disease was consulted.  Appreciate their input.  They feel that this is all contamination.  Blood cultures were repeated on 6/14 and negative so far.  Can discontinue cefazolin and start cefdinir as recommended by ID.    Will continue for 4 more days.  History of stroke Stable.  Has old left-sided hemiparesis.  Continue Plavix.  She does not ambulate much at home.  Seen by physical therapy who recommends skilled nursing facility for rehab.  Patient agreeable.   History of pernicious anemia  Continue home medications.  Hemoglobin is stable.  Chronic diastolic CHF and lymphedema Volume status is stable.  Continue Lasix.  Renal function and electrolytes are normal.  Hypokalemia Potassium level is normal. Continue daily potassium.  Essential hypertension  Continue amlodipine.  Morbid obesity Body mass index is 44.05 kg/m.  Overall stable.  Okay for discharge to skilled nursing facility today.     PERTINENT LABS:  The results of significant diagnostics from this hospitalization (including imaging, microbiology, ancillary and laboratory) are listed below for reference.    Microbiology: Recent Results (from the past 240 hour(s))  Urine culture     Status: None   Collection Time: 06/09/18 11:21 PM  Result Value Ref Range Status   Specimen Description URINE, CATHETERIZED  Final   Special Requests NONE  Final   Culture   Final    NO GROWTH Performed at [facility], [address],  [place], [place] [address]    Report Status 06/11/2018 FINAL  Final  Culture, blood (Routine x 2)     Status: Abnormal   Collection Time: 06/09/18 11:23 PM  Result Value Ref Range Status   Specimen Description BLOOD LEFT ANTECUBITAL  Final   Special  Requests   Final    BOTTLES DRAWN AEROBIC AND ANAEROBIC Blood Culture adequate volume   Culture  Setup Time   Final    GRAM POSITIVE COCCI IN BOTH AEROBIC AND ANAEROBIC BOTTLES CRITICAL RESULT CALLED TO, READ BACK BY AND VERIFIED WITHAsencion Islam PHARMD 2001 06/10/18 A BROWNING Performed at [facility], [place] [address], [place], [place] [address]    Culture (A)  Final    STAPHYLOCOCCUS SPECIES (COAGULASE NEGATIVE) THE SIGNIFICANCE OF ISOLATING THIS ORGANISM FROM A SINGLE SET OF BLOOD CULTURES WHEN MULTIPLE SETS ARE DRAWN IS UNCERTAIN. PLEASE NOTIFY THE MICROBIOLOGY DEPARTMENT WITHIN ONE WEEK IF SPECIATION AND SENSITIVITIES ARE REQUIRED. GROUP B STREP(S.AGALACTIAE)ISOLATED    Report Status 06/14/2018 FINAL  Final   Organism ID, Bacteria GROUP B STREP(S.AGALACTIAE)ISOLATED  Final      Susceptibility   Group b strep(s.agalactiae)isolated - MIC*    CLINDAMYCIN <=0.25 SENSITIVE Sensitive     AMPICILLIN <=0.25 SENSITIVE Sensitive     ERYTHROMYCIN <=0.12 SENSITIVE Sensitive     VANCOMYCIN 0.5 SENSITIVE Sensitive     CEFTRIAXONE <=0.12 SENSITIVE Sensitive     LEVOFLOXACIN 0.5 SENSITIVE Sensitive     * GROUP B STREP(S.AGALACTIAE)ISOLATED  Culture, blood (Routine x 2)     Status: None (Preliminary result)   Collection Time: 06/09/18 11:23 PM  Result Value Ref Range Status   Specimen Description BLOOD RIGHT ANTECUBITAL  Final   Special Requests   Final    BOTTLES DRAWN AEROBIC AND ANAEROBIC Blood Culture results may not be optimal due to an inadequate volume of blood received in culture bottles   Culture   Final    NO GROWTH 3 DAYS Performed at [facility], [place] [address] [place], [place] [address]    Report Status PENDING  Incomplete  Blood Culture ID Panel (Reflexed)     Status: Abnormal   Collection Time: 06/09/18 11:23 PM  Result Value Ref Range Status   Enterococcus species NOT DETECTED NOT DETECTED Final   Listeria monocytogenes NOT DETECTED NOT DETECTED Final    Staphylococcus species DETECTED (A) NOT DETECTED Final    Comment: Methicillin (oxacillin) susceptible coagulase negative staphylococcus. Possible blood culture contaminant (unless isolated from more than one blood culture draw or clinical case suggests pathogenicity). No antibiotic treatment is indicated for blood  culture contaminants. CRITICAL RESULT CALLED TO, READ BACK BY AND VERIFIED WITH: B MANCHERIL PHARMD 2001 06/10/18 A BROWNING    Staphylococcus aureus NOT DETECTED NOT DETECTED Final   Methicillin resistance NOT DETECTED NOT DETECTED Final   Streptococcus species DETECTED (A) NOT DETECTED Final    Comment: CRITICAL RESULT CALLED TO, READ BACK BY AND VERIFIED WITH: B MANCHERIL PHARMD 2001 06/10/18 A BROWNING    Streptococcus agalactiae DETECTED (A) NOT DETECTED Final    Comment: CRITICAL RESULT CALLED TO, READ BACK BY AND VERIFIED WITH: B MANCHERIL PHARMD 2001 06/10/18 A BROWNING    Streptococcus pneumoniae NOT DETECTED NOT DETECTED Final   Streptococcus pyogenes NOT DETECTED NOT DETECTED Final   Acinetobacter baumannii NOT DETECTED NOT DETECTED Final   Enterobacteriaceae species NOT DETECTED NOT DETECTED Final   Enterobacter cloacae complex NOT DETECTED NOT DETECTED Final   Escherichia coli NOT DETECTED NOT  DETECTED Final   Klebsiella oxytoca NOT DETECTED NOT DETECTED Final   Klebsiella pneumoniae NOT DETECTED NOT DETECTED Final   Proteus species NOT DETECTED NOT DETECTED Final   Serratia marcescens NOT DETECTED NOT DETECTED Final   Haemophilus influenzae NOT DETECTED NOT DETECTED Final   Neisseria meningitidis NOT DETECTED NOT DETECTED Final   Pseudomonas aeruginosa NOT DETECTED NOT DETECTED Final   Candida albicans NOT DETECTED NOT DETECTED Final   Candida glabrata NOT DETECTED NOT DETECTED Final   Candida krusei NOT DETECTED NOT DETECTED Final   Candida parapsilosis NOT DETECTED NOT DETECTED Final   Candida tropicalis NOT DETECTED NOT DETECTED Final    Comment: Performed  at [facility], [place] [address] [place], [place] [address]  Respiratory Panel by PCR     Status: None   Collection Time: 06/10/18  3:58 AM  Result Value Ref Range Status   Adenovirus NOT DETECTED NOT DETECTED Final   Coronavirus 229E NOT DETECTED NOT DETECTED Final   Coronavirus HKU1 NOT DETECTED NOT DETECTED Final   Coronavirus NL63 NOT DETECTED NOT DETECTED Final   Coronavirus OC43 NOT DETECTED NOT DETECTED Final   Metapneumovirus NOT DETECTED NOT DETECTED Final   Rhinovirus / Enterovirus NOT DETECTED NOT DETECTED Final   Influenza A NOT DETECTED NOT DETECTED Final   Influenza B NOT DETECTED NOT DETECTED Final   Parainfluenza Virus 1 NOT DETECTED NOT DETECTED Final   Parainfluenza Virus 2 NOT DETECTED NOT DETECTED Final   Parainfluenza Virus 3 NOT DETECTED NOT DETECTED Final   Parainfluenza Virus 4 NOT DETECTED NOT DETECTED Final   Respiratory Syncytial Virus NOT DETECTED NOT DETECTED Final   Bordetella pertussis NOT DETECTED NOT DETECTED Final   Chlamydophila pneumoniae NOT DETECTED NOT DETECTED Final   Mycoplasma pneumoniae NOT DETECTED NOT DETECTED Final    Comment: Performed at [facility], [place] [address] [place], [place] [address]  Culture, blood (routine x 2)     Status: None (Preliminary result)   Collection Time: 06/11/18  7:18 PM  Result Value Ref Range Status   Specimen Description BLOOD LEFT ARM  Final   Special Requests   Final    BOTTLES DRAWN AEROBIC ONLY Blood Culture results may not be optimal due to an inadequate volume of blood received in culture bottles   Culture   Final    NO GROWTH 2 DAYS Performed at [facility], [place] [address] [place], [place] [address]    Report Status PENDING  Incomplete  Culture, blood (routine x 2)     Status: None (Preliminary result)   Collection Time: 06/11/18  7:24 PM  Result Value Ref Range Status   Specimen Description BLOOD LEFT ARM  Final   Special Requests   Final    BOTTLES DRAWN AEROBIC ONLY Blood  Culture results may not be optimal due to an inadequate volume of blood received in culture bottles   Culture   Final    NO GROWTH 2 DAYS Performed at [facility], [place] [address] [place], [place] [address]    Report Status PENDING  Incomplete     Labs: Basic Metabolic Panel: Recent Labs  Lab 06/09/18 2323 06/10/18 0500 06/11/18 0543 06/12/18 0409 06/13/18 0831  NA 139  --  139 138 138  K 3.6  --  4.6 3.6 4.2  CL 104  --  106 105 107  CO2 25  --  27 27 25   GLUCOSE 128*  --  121* 106*  118*  BUN 12  --  17 14 12   CREATININE 1.06*  --  0.98 0.81 0.78  CALCIUM 9.3  --  9.4 9.1 9.1  MG  --  1.7  --   --   --    Liver Function Tests: Recent Labs  Lab 06/09/18 2323  AST 27  ALT 15  ALKPHOS 81  BILITOT 1.2  PROT 8.5*  ALBUMIN 2.8*   CBC: Recent Labs  Lab 06/09/18 2323 06/11/18 0543 06/12/18 0409  WBC 14.1* 9.7 8.0  NEUTROABS 12.6*  --   --   HGB 11.6* 10.5* 10.1*  HCT 37.9 33.7* 32.7*  MCV 98.4 97.7 97.3  PLT 311 318 300   BNP: BNP (last 3 results) Recent Labs    06/09/18 2323  BNP 124.7*     IMAGING STUDIES Dg Chest Port 1 View  Result Date: 06/11/2018 CLINICAL DATA:  Cough EXAM: PORTABLE CHEST 1 VIEW COMPARISON:  06/09/2017 FINDINGS: Cardiac shadow is stable. The lungs are well aerated bilaterally. Mild vascular congestion remains without significant pulmonary edema. Degenerative changes of the shoulder joints are noted. IMPRESSION: No significant change from the previous exam. Electronically Signed   By: [name] M.D.   On: 06/11/2018 07:27   Dg Chest Port 1 View  Result Date: 06/09/2018 CLINICAL DATA:  Initial evaluation for acute cough and fever. EXAM: PORTABLE CHEST 1 VIEW COMPARISON:  Prior radiograph from 04/05/2017. FINDINGS: Mild cardiomegaly, stable from previous. Mediastinal silhouette within normal limits. Lungs mildly hypoinflated. Perihilar vascular congestion without overt pulmonary edema. No focal infiltrates. No pleural effusion.  No pneumothorax. No acute osseus abnormality. Advanced degenerative changes about the shoulders. IMPRESSION: 1. Mild cardiomegaly with perihilar vascular congestion without overt pulmonary edema. 2. No other active cardiopulmonary disease. No focal infiltrates identified. Electronically Signed   By: Jeannine Boga M.D.   On: 06/09/2018 23:38    DISCHARGE EXAMINATION: Vitals:   06/13/18 1209 06/13/18 1547 06/13/18 2002 06/14/18 0359  BP: (!) 149/69 133/70 (!) 148/56 (!) 147/57  Pulse: 62 62 67 65  Resp: 20 16 18 20   Temp: 98.8 F (37.1 C) 98.5 F (36.9 C) 98.3 F (36.8 C) 98.2 F (36.8 C)  TempSrc: Oral Oral Oral Oral  SpO2: 100% 96% 98% 98%  Weight:    115.4 kg (254 lb 6.6 oz)  Height:       General appearance: alert, cooperative, appears stated age and no distress Resp: clear to auscultation bilaterally Cardio: regular rate and rhythm, S1, S2 normal, no murmur, click, rub or gallop GI: soft, non-tender; bowel sounds normal; no masses,  no organomegaly Extremities: extremities normal, atraumatic, no cyanosis or edema  DISPOSITION: Skilled nursing facility  Discharge Instructions    Call MD for:  extreme fatigue   Complete by:  As directed    Call MD for:  persistant dizziness or light-headedness   Complete by:  As directed    Call MD for:  persistant nausea and vomiting   Complete by:  As directed    Call MD for:  severe uncontrolled pain   Complete by:  As directed    Call MD for:  temperature >100.4   Complete by:  As directed    Discharge instructions   Complete by:  As directed    Please review instructions on the discharge summary.  You were cared for by a hospitalist during your hospital stay. If you have any questions about your discharge medications or the care you received while you were in the  hospital after you are discharged, you can call the unit and asked to speak with the hospitalist on call if the hospitalist that took care of you is not available. Once  you are discharged, your primary care physician will handle any further medical issues. Please note that NO REFILLS for any discharge medications will be authorized once you are discharged, as it is imperative that you return to your primary care physician (or establish a relationship with a primary care physician if you do not have one) for your aftercare needs so that they can reassess your need for medications and monitor your lab values. If you do not have a primary care physician, you can call [phone] for a physician referral.   Increase activity slowly   Complete by:  As directed          Allergies as of 06/14/2018      Reactions   Asa [aspirin] Other (See Comments)   Doctor told her not to take due to type anemia   Phosphate Other (See Comments)   Anything containing phosphate 6. Unknown reaction   Codeine Other (See Comments)   Made her too sleepy   Penicillins Itching, Swelling   Tolerates keflex      Medication List    TAKE these medications   acetaminophen 325 MG tablet Commonly known as:  TYLENOL Take 2 tablets (650 mg total) by mouth every 6 (six) hours as needed. What changed:  reasons to take this   amLODipine 10 MG tablet Commonly known as:  NORVASC Take 1 tablet (10 mg total) by mouth daily.   Biotin 1000 MCG tablet Take 1 tablet (1 mg total) by mouth daily.   CALCIUM + D PO Take 1 tablet by mouth daily.   cefdinir 300 MG capsule Commonly known as:  OMNICEF Take 1 capsule (300 mg total) by mouth every 12 (twelve) hours for 4 days. For 4 more days   clopidogrel 75 MG tablet Commonly known as:  PLAVIX Take 1 tablet (75 mg total) by mouth daily.   cyanocobalamin 1000 MCG/ML injection Commonly known as:  (VITAMIN B-12) Inject 1 mL (1,000 mcg total) into the muscle every 30 (thirty) days.   furosemide 40 MG tablet Commonly known as:  LASIX Take 1 tablet (40 mg total) by mouth daily with breakfast.   guaiFENesin 600 MG 12 hr tablet Commonly known as:   MUCINEX Take 1 tablet (600 mg total) by mouth 2 (two) times daily as needed for up to 14 days.   mineral oil-hydrophilic petrolatum ointment Apply 1 application topically daily. To lower extremities   multivitamin with minerals Tabs tablet Take 1 tablet by mouth daily.   potassium chloride SA 20 MEQ tablet Commonly known as:  K-DUR,KLOR-CON Take 1 tablet (20 mEq total) by mouth daily.   zolpidem 5 MG tablet Commonly known as:  AMBIEN Take 1 tablet (5 mg total) by mouth at bedtime as needed for sleep (insomnia).        Follow-up Information    [name], MD. Schedule an appointment as soon as possible for a visit in 1 week(s).   Specialty:  Internal Medicine Contact information: [address] [place] [address] [phone]           TOTAL DISCHARGE TIME: 71 minutes  Bonnielee Haff  [facility] Pager [phone]  06/14/2018, 10:08 AM

## 2018-06-14 NOTE — Progress Notes (Signed)
Report called to SNF. SW set transport up for approx 1330.  Pt  Alert and oriented. No distress noted.  Personal belongings with patient.

## 2018-06-14 NOTE — Clinical Social Work Note (Signed)
CSW facilitated patient discharge including contacting patient family and facility to confirm patient discharge plans. Clinical information faxed to facility and family agreeable with plan. CSW arranged ambulance transport via [facility] to [facility] at 1:30 pm. RN to call report prior to discharge [phone] Room [id number]).  RN please notify family when transport arrives so they will know when to meet her at SNF.  CSW will sign off for now as social work intervention is no longer needed. Please consult [place] again if new needs arise.  Dayton Scrape, [place]

## 2018-06-14 NOTE — Clinical Social Work Placement (Signed)
   CLINICAL SOCIAL WORK PLACEMENT  NOTE  Date:  06/14/2018  Patient Details  Name: Connie Miranda MRN: [id number] Date of Birth: 12-03-1933  Clinical Social Work is seeking post-discharge placement for this patient at the [place] level of care (*CSW will initial, date and re-position this form in  chart as items are completed):  Yes   Patient/family provided with [place] Work Department's list of facilities offering this level of care within the geographic area requested by the patient (or if unable, by the patient's family).  Yes   Patient/family informed of their freedom to choose among providers that offer the needed level of care, that participate in Medicare, Medicaid or managed care program needed by the patient, have an available bed and are willing to accept the patient.  Yes   Patient/family informed of [facility]'s ownership interest in [facility] and [facility], as well as of the fact that they are under no obligation to receive care at these facilities.  PASRR submitted to EDS on 06/12/18     PASRR number received on       Existing PASRR number confirmed on 06/12/18     FL2 transmitted to all facilities in geographic area requested by pt/family on 06/12/18     FL2 transmitted to all facilities within larger geographic area on       Patient informed that his/her managed care company has contracts with or will negotiate with certain facilities, including the following:        Yes   Patient/family informed of bed offers received.  Patient chooses bed at [facility]     Physician recommends and patient chooses bed at      Patient to be transferred to [facility] on 06/14/18.  Patient to be transferred to facility by [facility]     Patient family notified on 06/14/18 of transfer.  Name of family member notified:  Patient will call family.     PHYSICIAN       Additional Comment:     _______________________________________________ Candie Chroman, LCSW 06/14/2018, 11:49 AM

## 2018-06-14 NOTE — Clinical Social Work Note (Addendum)
CSW met with patient and provided bed offers. She has discussed with her supports and chosen [facility]. CSW left message for admissions coordinator to see if they have any female beds today.  Dayton Scrape, [place] [phone]  11:24 am [facility] can take patient today.  Dayton Scrape, [place]

## 2018-06-15 LAB — CULTURE, BLOOD (ROUTINE X 2): CULTURE: NO GROWTH

## 2018-06-16 LAB — CULTURE, BLOOD (ROUTINE X 2)
CULTURE: NO GROWTH
Culture: NO GROWTH

## 2019-03-24 ENCOUNTER — Emergency Department (HOSPITAL_COMMUNITY)
Admission: EM | Admit: 2019-03-24 | Discharge: 2019-03-24 | Disposition: A | Discharge status: Home / Self Care | Payer: Medicare Other | Attending: Emergency Medicine | Admitting: Emergency Medicine

## 2019-03-24 ENCOUNTER — Other Ambulatory Visit: Payer: Self-pay

## 2019-03-24 ENCOUNTER — Emergency Department (HOSPITAL_COMMUNITY): Payer: Medicare Other

## 2019-03-24 ENCOUNTER — Encounter (HOSPITAL_COMMUNITY): Payer: Self-pay | Admitting: Obstetrics and Gynecology

## 2019-03-24 DIAGNOSIS — Z8673 Personal history of transient ischemic attack (TIA), and cerebral infarction without residual deficits: Secondary | ICD-10-CM | POA: Insufficient documentation

## 2019-03-24 DIAGNOSIS — Z79899 Other long term (current) drug therapy: Secondary | ICD-10-CM | POA: Diagnosis not present

## 2019-03-24 DIAGNOSIS — R51 Headache: Secondary | ICD-10-CM | POA: Diagnosis not present

## 2019-03-24 DIAGNOSIS — Z87891 Personal history of nicotine dependence: Secondary | ICD-10-CM | POA: Diagnosis not present

## 2019-03-24 DIAGNOSIS — I110 Hypertensive heart disease with heart failure: Secondary | ICD-10-CM | POA: Diagnosis not present

## 2019-03-24 DIAGNOSIS — Z9049 Acquired absence of other specified parts of digestive tract: Secondary | ICD-10-CM | POA: Insufficient documentation

## 2019-03-24 DIAGNOSIS — R519 Headache, unspecified: Secondary | ICD-10-CM

## 2019-03-24 DIAGNOSIS — I5032 Chronic diastolic (congestive) heart failure: Secondary | ICD-10-CM | POA: Diagnosis not present

## 2019-03-24 NOTE — ED Notes (Signed)
Pt transport to CT  

## 2019-03-24 NOTE — ED Notes (Signed)
[facility] contacted for transport 

## 2019-03-24 NOTE — ED Triage Notes (Signed)
Pt is coming in via EMS from home after she called the PCP's office and told them she had a headache that had resolved. The pcp's office reportedly told they patient to still go to the ER for evaluation. Pt has no reported symptoms at this time.  Pt had defecated on herself at this time.

## 2019-03-24 NOTE — ED Provider Notes (Addendum)
Connie Miranda Provider Note   CSN: [id number] Arrival date & time: 03/24/19  1713    History   Chief Complaint Chief Complaint  Patient presents with  . Headache    HPI Connie Miranda is a 85 y.o. female.     Patient is an 85 year old female with a history of pernicious anemia, G6PD deficiency, CHF, stroke who is presenting today with a headache.  Patient states after waking up this morning she developed a headache on the right side of her head.  She states at 1 point today it was severe and she called her doctor that makes housecalls who told her she needed to go to the emergency room to get a CAT scan.  Patient states since that time her headache is now resolved.  She has no neck pain, visual changes, infectious symptoms.  She otherwise feels her normal self at this time.  The history is provided by the patient.  Headache  Pain location:  R parietal Quality:  Dull Radiates to:  Does not radiate Severity currently:  0/10 Severity at highest:  7/10 Onset quality:  Gradual Duration:  6 hours Timing:  Constant Progression:  Resolved Chronicity:  New Similar to prior headaches: yes   Context comment:  Not sure if anything made it worse but made her feel a little lightheaded Relieved by:  None tried Worsened by:  Nothing Ineffective treatments:  None tried Associated symptoms: near-syncope   Associated symptoms: no abdominal pain, no blurred vision, no congestion, no cough, no dizziness, no eye pain, no facial pain, no fatigue, no fever, no myalgias, no nausea, no neck pain, no neck stiffness, no photophobia, no sore throat, no visual change, no vomiting and no weakness   Associated symptoms comment:  No hx of head trauma or falls Risk factors comment:  Does take plavix   Past Medical History:  Diagnosis Date  . Edema   . Edema of both legs 01/2016  . Glucose-6-phosphate dehydrogenase deficiency    possible diagnosis of G6PD per patient  "phosphate 6 deficiency"  . Hypertension   . Osteoarthritis   . Pernicious anemia   . Stroke [facility]) 09/2000   couldn't move her left foot which resolved    Patient Active Problem List   Diagnosis Date Noted  . Chronic diastolic CHF (congestive heart failure) ([place]) 06/10/2018  . Sepsis ([place]) 06/10/2018  . Acute respiratory failure with hypoxia ([place]) 06/10/2018  . Hypokalemia 06/10/2018  . Acute bronchitis 06/10/2018  . Lower extremity weakness 02/11/2016  . Difficulty walking 04/08/2013  . Weakness of both legs 04/08/2013  . Morbid obesity ([place]) 04/08/2013  . Acute renal failure ([place]) 01/28/2013  . Hyperkalemia 01/28/2013  . Fall 01/28/2013  . Nausea 01/28/2013  . Lymphedema 10/16/2012  . Cellulitis 10/16/2012  . Diarrhea 10/13/2012  . Cellulitis of left lower leg 10/12/2012  . Anemia 10/12/2012  . Abdominal pain 10/12/2012  . Hip pain 10/12/2012  . Hypertension   . Edema   . Pernicious anemia   . Glucose-6-phosphate dehydrogenase deficiency   . Osteoarthritis   . Stroke [facility]) 09/28/2000    Past Surgical History:  Procedure Laterality Date  . BONE MARROW BIOPSY  12/1966   for anemia  . IR PERC CHOLECYSTOSTOMY  04/08/2017  . IR RADIOLOGIST EVAL & MGMT  05/20/2017  . IR SINUS/FIST TUBE CHK-NON GI  06/11/2017  . MULTIPLE TOOTH EXTRACTIONS    . OTHER SURGICAL HISTORY     cyst from panty line removed as  child     OB History   No obstetric history on file.      Home Medications    Prior to Admission medications   Medication Sig Start Date End Date Taking? Authorizing Provider  acetaminophen (TYLENOL) 500 MG tablet Take 500 mg by mouth every 6 (six) hours as needed for moderate pain.   Yes [provider]  amLODipine (NORVASC) 10 MG tablet Take 1 tablet (10 mg total) by mouth daily. 03/24/13  Yes Lauree Chandler, NP  Calcium Carbonate-Vitamin D (CALCIUM + D PO) Take 1 tablet by mouth daily.   Yes [provider]  clopidogrel (PLAVIX) 75 MG tablet  Take 1 tablet (75 mg total) by mouth daily. 03/24/13  Yes Lauree Chandler, NP  cyanocobalamin (,VITAMIN B-12,) 1000 MCG/ML injection Inject 1 mL (1,000 mcg total) into the muscle every 30 (thirty) days. 03/24/13  Yes Lauree Chandler, NP  furosemide (LASIX) 40 MG tablet Take 1 tablet (40 mg total) by mouth daily with breakfast. 04/11/13  Yes Dellinger, Bobby Rumpf, PA-C  isosorbide mononitrate (IMDUR) 120 MG 24 hr tablet Take 120 mg by mouth daily. 03/19/19  Yes [provider]  Multiple Vitamin (MULTIVITAMIN WITH MINERALS) TABS Take 1 tablet by mouth daily. 03/24/13  Yes Lauree Chandler, NP  potassium chloride SA (K-DUR,KLOR-CON) 20 MEQ tablet Take 1 tablet (20 mEq total) by mouth daily. 04/11/13  Yes Dellinger, Bobby Rumpf, PA-C  acetaminophen (TYLENOL) 325 MG tablet Take 2 tablets (650 mg total) by mouth every 6 (six) hours as needed. Patient not taking: Reported on 03/24/2019 04/11/13   Dellinger, Bobby Rumpf, PA-C  Biotin 1000 MCG tablet Take 1 tablet (1 mg total) by mouth daily. Patient not taking: Reported on 03/24/2019 03/24/13   Lauree Chandler, NP  mineral oil-hydrophilic petrolatum (AQUAPHOR) ointment Apply 1 application topically daily. To lower extremities Patient not taking: Reported on 03/24/2019 03/24/13   Lauree Chandler, NP  zolpidem (AMBIEN) 5 MG tablet Take 1 tablet (5 mg total) by mouth at bedtime as needed for sleep (insomnia). 06/14/18   Bonnielee Haff, MD    Family History Family History  Problem Relation Age of Onset  . Diabetes Brother   . Diabetes Brother   . Heart failure Mother 34  . Heart attack Father 78    Social History Social History   Tobacco Use  . Smoking status: Former Smoker    Types: Cigarettes    Last attempt to quit: 12/29/1993    Years since quitting: 25.2  . Smokeless tobacco: Never Used  Substance Use Topics  . Alcohol use: No  . Drug use: No     Allergies   Asa [aspirin]; Phosphate; Codeine; and Penicillins   Review of  Systems Review of Systems  Constitutional: Negative for fatigue and fever.  HENT: Negative for congestion and sore throat.   Eyes: Negative for blurred vision, photophobia and pain.  Respiratory: Negative for cough.   Cardiovascular: Positive for near-syncope.  Gastrointestinal: Negative for abdominal pain, nausea and vomiting.  Musculoskeletal: Negative for myalgias, neck pain and neck stiffness.  Neurological: Positive for headaches. Negative for dizziness and weakness.  All other systems reviewed and are negative.    Physical Exam Updated Vital Signs BP (!) 182/61 (BP Location: Right Arm)   Pulse 84   Temp 97.9 F (36.6 C)   Resp 16   Ht 5' 3.5" (1.613 m)   SpO2 96%   BMI 44.36 kg/m   Physical Exam Vitals signs and  nursing note reviewed.  Constitutional:      General: She is not in acute distress.    Appearance: She is well-developed. She is obese.  HENT:     Head: Normocephalic and atraumatic.  Eyes:     Extraocular Movements: Extraocular movements intact.     Conjunctiva/sclera: Conjunctivae normal.     Pupils: Pupils are equal, round, and reactive to light.     Comments: Extraocular movements and pupil is reactive on the left side.  Right eye is minimally reactive and deviates outward.  Chronic deficit.  Neck:     Musculoskeletal: Normal range of motion and neck supple. No neck rigidity.  Cardiovascular:     Rate and Rhythm: Normal rate and regular rhythm.     Heart sounds: No murmur.  Pulmonary:     Effort: Pulmonary effort is normal. No respiratory distress.     Breath sounds: Normal breath sounds. No wheezing or rales.  Abdominal:     General: There is no distension.     Palpations: Abdomen is soft.     Tenderness: There is no abdominal tenderness. There is no guarding or rebound.  Musculoskeletal: Normal range of motion.        General: No tenderness.  Skin:    General: Skin is warm and dry.     Findings: No erythema or rash.  Neurological:     Mental  Status: She is alert and oriented to person, place, and time. Mental status is at baseline.     Cranial Nerves: No dysarthria or facial asymmetry.     Comments: No visual field defect in the left eye.  Chronic weakness in the left lower leg but patient states is baseline.  No pronator drift in upper extremities and 5 out of 5 strength in bilateral upper arms  Psychiatric:        Mood and Affect: Mood normal.        Speech: Speech normal.        Behavior: Behavior normal.      ED Treatments / Results  Labs (all labs ordered are listed, but only abnormal results are displayed) Labs Reviewed - No data to display  EKG None  Radiology Ct Head Wo Contrast  Result Date: 03/24/2019 CLINICAL DATA:  Headaches EXAM: CT HEAD WITHOUT CONTRAST TECHNIQUE: Contiguous axial images were obtained from the base of the skull through the vertex without intravenous contrast. COMPARISON:  02/11/2016 FINDINGS: Brain: Mild atrophic changes and chronic white matter ischemic changes are seen. No findings to suggest acute hemorrhage, acute infarction or space-occupying mass lesion are noted. Vascular: No hyperdense vessel or unexpected calcification. Skull: Normal. Negative for fracture or focal lesion. Sinuses/Orbits: No acute finding. Other: None. IMPRESSION: Chronic atrophic and ischemic changes stable from the prior exam. No acute abnormality noted. Electronically Signed   By: [name] M.D.   On: 03/24/2019 19:36    Procedures Procedures (including critical care time)  Medications Ordered in ED Medications - No data to display   Initial Impression / Assessment and Plan / ED Course  I have reviewed the triage vital signs and the nursing notes.  Pertinent labs & imaging results that were available during my care of the patient were reviewed by me and considered in my medical decision making (see chart for details).       Patient presenting with a headache from home that is now spontaneously  resolved.  She has no neck pain visual changes and denies any trauma.  Patient does  take Plavix and patient's PCP sent her here for a CT of her head.  There is no focal exam findings today.  She denies any infectious symptoms and vital signs are reassuring except for hypertension with blood pressure of 182/61.  Will repeat.  7:52 PM CT without acute findings.  Pt still headache free and will d/c home.  Final Clinical Impressions(s) / ED Diagnoses   Final diagnoses:  Acute intractable headache, unspecified headache type    ED Discharge Orders    None       Blanchie Dessert, MD 03/24/19 [place]    Blanchie Dessert, MD 03/24/19 [phone]

## 2019-12-15 ENCOUNTER — Other Ambulatory Visit: Payer: Self-pay

## 2019-12-15 ENCOUNTER — Emergency Department (HOSPITAL_COMMUNITY)
Admission: EM | Admit: 2019-12-15 | Discharge: 2019-12-16 | Disposition: A | Discharge status: Home / Self Care | Payer: Medicare Other | Attending: Emergency Medicine | Admitting: Emergency Medicine

## 2019-12-15 ENCOUNTER — Emergency Department (HOSPITAL_COMMUNITY): Payer: Medicare Other

## 2019-12-15 DIAGNOSIS — R590 Localized enlarged lymph nodes: Secondary | ICD-10-CM | POA: Insufficient documentation

## 2019-12-15 DIAGNOSIS — N764 Abscess of vulva: Secondary | ICD-10-CM

## 2019-12-15 DIAGNOSIS — I110 Hypertensive heart disease with heart failure: Secondary | ICD-10-CM | POA: Diagnosis not present

## 2019-12-15 DIAGNOSIS — K5900 Constipation, unspecified: Secondary | ICD-10-CM

## 2019-12-15 DIAGNOSIS — K625 Hemorrhage of anus and rectum: Secondary | ICD-10-CM | POA: Diagnosis present

## 2019-12-15 DIAGNOSIS — Z87891 Personal history of nicotine dependence: Secondary | ICD-10-CM | POA: Diagnosis not present

## 2019-12-15 DIAGNOSIS — Z79899 Other long term (current) drug therapy: Secondary | ICD-10-CM | POA: Insufficient documentation

## 2019-12-15 DIAGNOSIS — I5032 Chronic diastolic (congestive) heart failure: Secondary | ICD-10-CM | POA: Diagnosis not present

## 2019-12-15 LAB — CBC WITH DIFFERENTIAL/PLATELET
Abs Immature Granulocytes: 0.04 10*3/uL (ref 0.00–0.07)
Basophils Absolute: 0 10*3/uL (ref 0.0–0.1)
Basophils Relative: 0 %
Eosinophils Absolute: 0.2 10*3/uL (ref 0.0–0.5)
Eosinophils Relative: 2 %
HCT: 37.1 % (ref 36.0–46.0)
Hemoglobin: 11.2 g/dL — ABNORMAL LOW (ref 12.0–15.0)
Immature Granulocytes: 0 %
Lymphocytes Relative: 11 %
Lymphs Abs: 1 10*3/uL (ref 0.7–4.0)
MCH: 30 pg (ref 26.0–34.0)
MCHC: 30.2 g/dL (ref 30.0–36.0)
MCV: 99.5 fL (ref 80.0–100.0)
Monocytes Absolute: 0.5 10*3/uL (ref 0.1–1.0)
Monocytes Relative: 5 %
Neutro Abs: 7.4 10*3/uL (ref 1.7–7.7)
Neutrophils Relative %: 82 %
Platelets: 334 10*3/uL (ref 150–400)
RBC: 3.73 MIL/uL — ABNORMAL LOW (ref 3.87–5.11)
RDW: 13.2 % (ref 11.5–15.5)
WBC: 9.1 10*3/uL (ref 4.0–10.5)
nRBC: 0 % (ref 0.0–0.2)

## 2019-12-15 LAB — I-STAT CHEM 8, ED
BUN: 10 mg/dL (ref 8–23)
Calcium, Ion: 1.16 mmol/L (ref 1.15–1.40)
Chloride: 102 mmol/L (ref 98–111)
Creatinine, Ser: 0.6 mg/dL (ref 0.44–1.00)
Glucose, Bld: 134 mg/dL — ABNORMAL HIGH (ref 70–99)
HCT: 36 % (ref 36.0–46.0)
Hemoglobin: 12.2 g/dL (ref 12.0–15.0)
Potassium: 3.7 mmol/L (ref 3.5–5.1)
Sodium: 139 mmol/L (ref 135–145)
TCO2: 28 mmol/L (ref 22–32)

## 2019-12-15 LAB — COMPREHENSIVE METABOLIC PANEL
ALT: 8 U/L (ref 0–44)
AST: 14 U/L — ABNORMAL LOW (ref 15–41)
Albumin: 2.9 g/dL — ABNORMAL LOW (ref 3.5–5.0)
Alkaline Phosphatase: 75 U/L (ref 38–126)
Anion gap: 8 (ref 5–15)
BUN: 12 mg/dL (ref 8–23)
CO2: 27 mmol/L (ref 22–32)
Calcium: 8.8 mg/dL — ABNORMAL LOW (ref 8.9–10.3)
Chloride: 103 mmol/L (ref 98–111)
Creatinine, Ser: 0.61 mg/dL (ref 0.44–1.00)
GFR calc Af Amer: >60 mL/min (ref >60)
GFR calc non Af Amer: >60 mL/min (ref >60)
Glucose, Bld: 141 mg/dL — ABNORMAL HIGH (ref 70–99)
Potassium: 3.7 mmol/L (ref 3.5–5.1)
Sodium: 138 mmol/L (ref 135–145)
Total Bilirubin: 0.4 mg/dL (ref 0.3–1.2)
Total Protein: 7.5 g/dL (ref 6.5–8.1)

## 2019-12-15 LAB — URINALYSIS, ROUTINE W REFLEX MICROSCOPIC
Bilirubin Urine: NEGATIVE
Glucose, UA: NEGATIVE mg/dL
Ketones, ur: NEGATIVE mg/dL
Nitrite: NEGATIVE
Protein, ur: 100 mg/dL — AB
RBC / HPF: >50 RBC/hpf — ABNORMAL HIGH (ref 0–5)
Specific Gravity, Urine: >1.046 — ABNORMAL HIGH (ref 1.005–1.030)
WBC, UA: >50 WBC/hpf — ABNORMAL HIGH (ref 0–5)
pH: 6 (ref 5.0–8.0)

## 2019-12-15 LAB — LACTIC ACID, PLASMA
Lactic Acid, Venous: 2 mmol/L (ref 0.5–1.9)
Lactic Acid, Venous: 1.3 mmol/L (ref 0.5–1.9)

## 2019-12-15 MED ORDER — POLYETHYLENE GLYCOL 3350 17 G PO PACK: 17.0000 g | PACK | Freq: Every day | ORAL | 0 refills | Status: DC

## 2019-12-15 MED ORDER — CEPHALEXIN 500 MG PO CAPS: 1000.0000 mg | ORAL_CAPSULE | Freq: Three times a day (TID) | ORAL | 0 refills | Status: DC

## 2019-12-15 MED ORDER — SODIUM CHLORIDE (PF) 0.9 % IJ SOLN
INTRAMUSCULAR | Status: AC
  Filled 2019-12-15: qty 50

## 2019-12-15 MED ORDER — IOHEXOL 300 MG/ML  SOLN
100.0000 mL | Freq: Once | INTRAMUSCULAR | Status: AC | PRN
  Administered 2019-12-15: 19:00:00 100 mL via INTRAVENOUS

## 2019-12-15 MED ORDER — SODIUM CHLORIDE 0.9 % IV SOLN
1.0000 g | Freq: Once | INTRAVENOUS | Status: AC
  Administered 2019-12-15: 1 g via INTRAVENOUS
  Filled 2019-12-15: qty 10

## 2019-12-15 NOTE — ED Notes (Signed)
[facility] called for transportation. Daughter has been made aware of discharge dispo.

## 2019-12-15 NOTE — ED Triage Notes (Addendum)
Pt arrived via [facility] from home CC rectal bleeding and genital lesions  Pt has home health nurse who discovered " Pts brief  ull of blood from rectum. Pt has Hx hemmroids. Noticed leg swelling X 3days". Pt ambulatory at baseline. A/OX4 lives alone

## 2019-12-15 NOTE — ED Provider Notes (Signed)
[place] DEPT Provider Note   CSN: [id number] Arrival date & time: 12/15/19  1730     History Chief Complaint  Patient presents with  . Rectal Bleeding  . Leg Swelling    Connie Miranda is a 85 y.o. female hx of HTN, previous stroke who is bedbound, presenting with blood in her diaper.  Patient states that she does not walk at baseline.  Patient does have home health that visits her every day.  The home health nurse came and noticed some blood in her diaper area and was not sure if it is from her vagina or from hemorrhoids. She does have a history of hemorrhoids.  Patient states that she noticed some foul-smelling odor in her private area but she is unable to care for herself at home so the home health care nurse usually changes her.  The history is provided by the patient.       Past Medical History:  Diagnosis Date  . Edema   . Edema of both legs 01/2016  . Glucose-6-phosphate dehydrogenase deficiency    possible diagnosis of G6PD per patient "phosphate 6 deficiency"  . Hypertension   . Osteoarthritis   . Pernicious anemia   . Stroke [facility]) 09/2000   couldn't move her left foot which resolved    Patient Active Problem List   Diagnosis Date Noted  . Chronic diastolic CHF (congestive heart failure) ([place]) 06/10/2018  . Sepsis ([place]) 06/10/2018  . Acute respiratory failure with hypoxia ([place]) 06/10/2018  . Hypokalemia 06/10/2018  . Acute bronchitis 06/10/2018  . Lower extremity weakness 02/11/2016  . Difficulty walking 04/08/2013  . Weakness of both legs 04/08/2013  . Morbid obesity ([place]) 04/08/2013  . Acute renal failure ([place]) 01/28/2013  . Hyperkalemia 01/28/2013  . Fall 01/28/2013  . Nausea 01/28/2013  . Lymphedema 10/16/2012  . Cellulitis 10/16/2012  . Diarrhea 10/13/2012  . Cellulitis of left lower leg 10/12/2012  . Anemia 10/12/2012  . Abdominal pain 10/12/2012  . Hip pain 10/12/2012  . Hypertension   . Edema   . Pernicious  anemia   . Glucose-6-phosphate dehydrogenase deficiency   . Osteoarthritis   . Stroke [facility]) 09/28/2000    Past Surgical History:  Procedure Laterality Date  . BONE MARROW BIOPSY  12/1966   for anemia  . IR PERC CHOLECYSTOSTOMY  04/08/2017  . IR RADIOLOGIST EVAL & MGMT  05/20/2017  . IR SINUS/FIST TUBE CHK-NON GI  06/11/2017  . MULTIPLE TOOTH EXTRACTIONS    . OTHER SURGICAL HISTORY     cyst from panty line removed as child     OB History   No obstetric history on file.     Family History  Problem Relation Age of Onset  . Diabetes Brother   . Diabetes Brother   . Heart failure Mother 4  . Heart attack Father 61    Social History   Tobacco Use  . Smoking status: Former Smoker    Types: Cigarettes    Quit date: 12/29/1993    Years since quitting: 25.9  . Smokeless tobacco: Never Used  Substance Use Topics  . Alcohol use: No  . Drug use: No    Home Medications Prior to Admission medications   Medication Sig Start Date End Date Taking? Authorizing Provider  acetaminophen (TYLENOL) 325 MG tablet Take 2 tablets (650 mg total) by mouth every 6 (six) hours as needed. Patient not taking: Reported on 03/24/2019 04/11/13   Dellinger, Bobby Rumpf, PA-C  acetaminophen (TYLENOL)  500 MG tablet Take 500 mg by mouth every 6 (six) hours as needed for moderate pain.    [provider]  amLODipine (NORVASC) 10 MG tablet Take 1 tablet (10 mg total) by mouth daily. 03/24/13   Lauree Chandler, NP  Biotin 1000 MCG tablet Take 1 tablet (1 mg total) by mouth daily. Patient not taking: Reported on 03/24/2019 03/24/13   Lauree Chandler, NP  Calcium Carbonate-Vitamin D (CALCIUM + D PO) Take 1 tablet by mouth daily.    [provider]  clopidogrel (PLAVIX) 75 MG tablet Take 1 tablet (75 mg total) by mouth daily. 03/24/13   Lauree Chandler, NP  cyanocobalamin (,VITAMIN B-12,) 1000 MCG/ML injection Inject 1 mL (1,000 mcg total) into the muscle every 30 (thirty) days. 03/24/13    Lauree Chandler, NP  furosemide (LASIX) 40 MG tablet Take 1 tablet (40 mg total) by mouth daily with breakfast. 04/11/13   Dellinger, Bobby Rumpf, PA-C  isosorbide mononitrate (IMDUR) 120 MG 24 hr tablet Take 120 mg by mouth daily. 03/19/19   [provider]  mineral oil-hydrophilic petrolatum (AQUAPHOR) ointment Apply 1 application topically daily. To lower extremities Patient not taking: Reported on 03/24/2019 03/24/13   Lauree Chandler, NP  Multiple Vitamin (MULTIVITAMIN WITH MINERALS) TABS Take 1 tablet by mouth daily. 03/24/13   Lauree Chandler, NP  potassium chloride SA (K-DUR,KLOR-CON) 20 MEQ tablet Take 1 tablet (20 mEq total) by mouth daily. 04/11/13   Dellinger, Bobby Rumpf, PA-C  zolpidem (AMBIEN) 5 MG tablet Take 1 tablet (5 mg total) by mouth at bedtime as needed for sleep (insomnia). 06/14/18   Bonnielee Haff, MD    Allergies    Asa [aspirin], Phosphate, Codeine, and Penicillins  Review of Systems   Review of Systems  Gastrointestinal: Positive for hematochezia.  Genitourinary:       Blood in diaper   All other systems reviewed and are negative.   Physical Exam Updated Vital Signs BP (!) 158/60 (BP Location: Right Arm)   Pulse 84   Temp 98.5 F (36.9 C) (Oral)   Resp 18   SpO2 94%   Physical Exam Vitals and nursing note reviewed.  Constitutional:      Comments: Chronically ill   HENT:     Head: Normocephalic.     Nose: Nose normal.     Mouth/Throat:     Mouth: Mucous membranes are moist.  Eyes:     Extraocular Movements: Extraocular movements intact.     Pupils: Pupils are equal, round, and reactive to light.  Cardiovascular:     Rate and Rhythm: Normal rate and regular rhythm.     Pulses: Normal pulses.     Heart sounds: Normal heart sounds.  Pulmonary:     Effort: Pulmonary effort is normal.     Breath sounds: Normal breath sounds.  Abdominal:     General: Abdomen is flat.     Palpations: Abdomen is soft.  Genitourinary:    Comments:  Prominent vaginal labia that is protruding out from the vagina. ? Fluctuance around the labia. There is stool around the vaginal vault and some dry blood. Foul smelling odor around the area. No active bleeding from within the vagina. There are small external hemorrhoids that are not actively bleeding.  Musculoskeletal:     Cervical back: Normal range of motion.  Skin:    General: Skin is warm.     Capillary Refill: Capillary refill takes less than 2 seconds.  Neurological:  General: No focal deficit present.  Psychiatric:        Mood and Affect: Mood normal.        Behavior: Behavior normal.     ED Results / Procedures / Treatments   Labs (all labs ordered are listed, but only abnormal results are displayed) Labs Reviewed  CBC WITH DIFFERENTIAL/PLATELET - Abnormal; Notable for the following components:      Result Value   RBC 3.73 (*)    Hemoglobin 11.2 (*)    All other components within normal limits  COMPREHENSIVE METABOLIC PANEL - Abnormal; Notable for the following components:   Glucose, Bld 141 (*)    Calcium 8.8 (*)    Albumin 2.9 (*)    AST 14 (*)    All other components within normal limits  LACTIC ACID, PLASMA - Abnormal; Notable for the following components:   Lactic Acid, Venous 2.0 (*)    All other components within normal limits  URINALYSIS, ROUTINE W REFLEX MICROSCOPIC - Abnormal; Notable for the following components:   Color, Urine RED (*)    APPearance CLOUDY (*)    Specific Gravity, Urine >1.046 (*)    Hgb urine dipstick LARGE (*)    Protein, ur 100 (*)    Leukocytes,Ua LARGE (*)    RBC / HPF >50 (*)    WBC, UA >50 (*)    Bacteria, UA RARE (*)    All other components within normal limits  I-STAT CHEM 8, ED - Abnormal; Notable for the following components:   Glucose, Bld 134 (*)    All other components within normal limits  CULTURE, BLOOD (ROUTINE X 2)  CULTURE, BLOOD (ROUTINE X 2)  URINE CULTURE  LACTIC ACID, PLASMA    EKG None  Radiology CT  PELVIS W CONTRAST  Result Date: 12/15/2019 CLINICAL DATA:  Lymphadenopathy with rectal bleeding and possible inguinal abscess. EXAM: CT PELVIS WITH CONTRAST TECHNIQUE: Multidetector CT imaging of the pelvis was performed using the standard protocol following the bolus administration of intravenous contrast. CONTRAST:  112m OMNIPAQUE IOHEXOL 300 MG/ML  SOLN COMPARISON:  CT of the pelvis dated April 06, 2017. FINDINGS: Urinary Tract:  No abnormality visualized. Bowel: There is a very large amount of stool at the level of the rectum. There is sigmoid diverticulosis without CT evidence for diverticulitis. The remaining small bowel is unremarkable. The appendix is unremarkable. Vascular/Lymphatic: There are atherosclerotic changes involving the abdominal aorta and the common iliac vasculature. Reproductive: There is diffuse enlargement of the uterus with extensive thickening of the endometrial stripe. There is a probable small to moderate volume of free fluid in the patient's endometrial canal. A loop IUD is noted in the lower uterine segment. There is a small amount of fluid in the vaginal vault. The ovaries appeared grossly unremarkable where visualized. Other: There is a possible thin 2.8 by 1 cm fluid collection in the right vulva. There is mild surrounding fat stranding. There are no pathologically enlarged inguinal lymph nodes. No pathologically enlarged pelvic lymph nodes. Musculoskeletal: There are advanced degenerative changes at the L5-S1 level. There is diffuse osteopenia which limits detection of nondisplaced fractures. IMPRESSION: 1. There is a possible thin 2.8 x 1 cm fluid collection in the right vulva. This may represent a small abscess in the appropriate clinical setting. 2. Marked thickening of the endometrial stripe with a loop IUD in the lower uterine segment. Ultrasound follow-up is recommended. 3. Sigmoid diverticulosis without CT evidence for diverticulitis. 4. Large amount of stool at the level  of the rectum. Correlate for constipation/fecal impaction. 5. Diffuse osteopenia. Aortic Atherosclerosis (ICD10-I70.0). Electronically Signed   By: Constance Holster M.D.   On: 12/15/2019 19:59    Procedures Procedures (including critical care time)  Medications Ordered in ED Medications  sodium chloride (PF) 0.9 % injection (  Not Given 12/15/19 2012)  cefTRIAXone (ROCEPHIN) 1 g in sodium chloride 0.9 % 100 mL IVPB (0 g Intravenous Stopped 12/15/19 1948)  iohexol (OMNIPAQUE) 300 MG/ML solution 100 mL (100 mLs Intravenous Contrast Given 12/15/19 1928)    ED Course  I have reviewed the triage vital signs and the nursing notes.  Pertinent labs & imaging results that were available during my care of the patient were reviewed by me and considered in my medical decision making (see chart for details).    MDM Rules/Calculators/A&P                     Connie Miranda is a 85 y.o. female here with blood in her diaper. She has hemorrhoids but doesn't appear to be bleeding. She has foul smelling odor around her pelvic area and I wonder if she has mild cellulitis vs bartholin abscess. She has swollen labia so its difficult to assess for abscess. Also can have UTI as well. Will do sepsis workup and get CT pelvis.   9:45 PM Lactate 2. WBC normal.  UA showed UTI but I think that is likely contamination from her abscess. Her CT showed small abscess R vulva. I tried to perform external exam and unable to visualize it. She is bedbound and I am unable to get her in a stirrup for better exam. I talked to Dr. Roland Rack from [facility], who reviewed images. She states that the abscess is small and can be treated with keflex. Given rocephin in the ED. Will dc home with keflex. I talked to her daughter who states that patient does have chronic discharge and this is not new. Patient states that her home health care nurse is new today and doesn't know her that well. Will dc home with keflex. Recommend GYN follow up.    Final Clinical Impression(s) / ED Diagnoses Final diagnoses:  None    Rx / DC Orders ED Discharge Orders    None       Drenda Freeze, MD 12/15/19 2150

## 2019-12-15 NOTE — Discharge Instructions (Addendum)
Take keflex 1000 mg three times daily for a week   Take miralax for constipation.   Follow up with GYN doctor. Or have your doctor do a pap smear   You are likely going to have some blood in your urine   Return to ER if you have fever, vomiting, severe pain and discharge

## 2019-12-15 NOTE — ED Notes (Signed)
Pt transported to CT ?

## 2019-12-16 LAB — URINE CULTURE: Culture: NO GROWTH

## 2019-12-20 LAB — CULTURE, BLOOD (ROUTINE X 2)
Culture: NO GROWTH
Culture: NO GROWTH
Special Requests: ADEQUATE

## 2019-12-24 ENCOUNTER — Emergency Department (HOSPITAL_COMMUNITY): Payer: Medicare Other

## 2019-12-24 ENCOUNTER — Other Ambulatory Visit: Payer: Self-pay

## 2019-12-24 ENCOUNTER — Inpatient Hospital Stay (HOSPITAL_COMMUNITY)
Admission: EM | Admit: 2019-12-24 | Discharge: 2020-01-04 | DRG: 853 | Disposition: A | Discharge status: Home / Self Care | Payer: Medicare Other | Attending: Internal Medicine | Admitting: Internal Medicine

## 2019-12-24 DIAGNOSIS — Z6841 Body Mass Index (BMI) 40.0 and over, adult: Secondary | ICD-10-CM

## 2019-12-24 DIAGNOSIS — I69354 Hemiplegia and hemiparesis following cerebral infarction affecting left non-dominant side: Secondary | ICD-10-CM | POA: Diagnosis not present

## 2019-12-24 DIAGNOSIS — N762 Acute vulvitis: Secondary | ICD-10-CM | POA: Diagnosis present

## 2019-12-24 DIAGNOSIS — R5381 Other malaise: Secondary | ICD-10-CM | POA: Diagnosis present

## 2019-12-24 DIAGNOSIS — Z7902 Long term (current) use of antithrombotics/antiplatelets: Secondary | ICD-10-CM

## 2019-12-24 DIAGNOSIS — Z975 Presence of (intrauterine) contraceptive device: Secondary | ICD-10-CM

## 2019-12-24 DIAGNOSIS — L03116 Cellulitis of left lower limb: Secondary | ICD-10-CM | POA: Diagnosis present

## 2019-12-24 DIAGNOSIS — E876 Hypokalemia: Secondary | ICD-10-CM | POA: Diagnosis present

## 2019-12-24 DIAGNOSIS — I5032 Chronic diastolic (congestive) heart failure: Secondary | ICD-10-CM | POA: Diagnosis present

## 2019-12-24 DIAGNOSIS — L0390 Cellulitis, unspecified: Secondary | ICD-10-CM | POA: Diagnosis not present

## 2019-12-24 DIAGNOSIS — I890 Lymphedema, not elsewhere classified: Secondary | ICD-10-CM | POA: Diagnosis present

## 2019-12-24 DIAGNOSIS — K5641 Fecal impaction: Secondary | ICD-10-CM | POA: Diagnosis present

## 2019-12-24 DIAGNOSIS — D638 Anemia in other chronic diseases classified elsewhere: Secondary | ICD-10-CM | POA: Diagnosis not present

## 2019-12-24 DIAGNOSIS — N179 Acute kidney failure, unspecified: Secondary | ICD-10-CM | POA: Diagnosis present

## 2019-12-24 DIAGNOSIS — N950 Postmenopausal bleeding: Secondary | ICD-10-CM | POA: Diagnosis present

## 2019-12-24 DIAGNOSIS — Z833 Family history of diabetes mellitus: Secondary | ICD-10-CM | POA: Diagnosis not present

## 2019-12-24 DIAGNOSIS — Z87891 Personal history of nicotine dependence: Secondary | ICD-10-CM

## 2019-12-24 DIAGNOSIS — J9601 Acute respiratory failure with hypoxia: Secondary | ICD-10-CM | POA: Diagnosis present

## 2019-12-24 DIAGNOSIS — C541 Malignant neoplasm of endometrium: Secondary | ICD-10-CM | POA: Diagnosis present

## 2019-12-24 DIAGNOSIS — R9389 Abnormal findings on diagnostic imaging of other specified body structures: Secondary | ICD-10-CM | POA: Diagnosis present

## 2019-12-24 DIAGNOSIS — A419 Sepsis, unspecified organism: Secondary | ICD-10-CM | POA: Diagnosis present

## 2019-12-24 DIAGNOSIS — I10 Essential (primary) hypertension: Secondary | ICD-10-CM | POA: Diagnosis present

## 2019-12-24 DIAGNOSIS — Z30432 Encounter for removal of intrauterine contraceptive device: Secondary | ICD-10-CM | POA: Diagnosis not present

## 2019-12-24 DIAGNOSIS — G9341 Metabolic encephalopathy: Secondary | ICD-10-CM | POA: Diagnosis present

## 2019-12-24 DIAGNOSIS — Z20822 Contact with and (suspected) exposure to covid-19: Secondary | ICD-10-CM | POA: Diagnosis present

## 2019-12-24 DIAGNOSIS — Z7401 Bed confinement status: Secondary | ICD-10-CM

## 2019-12-24 DIAGNOSIS — D75A Glucose-6-phosphate dehydrogenase (G6PD) deficiency without anemia: Secondary | ICD-10-CM | POA: Diagnosis present

## 2019-12-24 DIAGNOSIS — Z79899 Other long term (current) drug therapy: Secondary | ICD-10-CM

## 2019-12-24 DIAGNOSIS — R579 Shock, unspecified: Secondary | ICD-10-CM | POA: Insufficient documentation

## 2019-12-24 DIAGNOSIS — I110 Hypertensive heart disease with heart failure: Secondary | ICD-10-CM | POA: Diagnosis present

## 2019-12-24 DIAGNOSIS — R6510 Systemic inflammatory response syndrome (SIRS) of non-infectious origin without acute organ dysfunction: Secondary | ICD-10-CM

## 2019-12-24 DIAGNOSIS — R4182 Altered mental status, unspecified: Secondary | ICD-10-CM | POA: Diagnosis present

## 2019-12-24 DIAGNOSIS — Z8249 Family history of ischemic heart disease and other diseases of the circulatory system: Secondary | ICD-10-CM

## 2019-12-24 DIAGNOSIS — L120 Bullous pemphigoid: Secondary | ICD-10-CM | POA: Diagnosis present

## 2019-12-24 DIAGNOSIS — R6520 Severe sepsis without septic shock: Secondary | ICD-10-CM | POA: Diagnosis not present

## 2019-12-24 LAB — URINALYSIS, MICROSCOPIC (REFLEX)

## 2019-12-24 LAB — CBC WITH DIFFERENTIAL/PLATELET
Abs Immature Granulocytes: 0.25 10*3/uL — ABNORMAL HIGH (ref 0.00–0.07)
Basophils Absolute: 0.1 10*3/uL (ref 0.0–0.1)
Basophils Relative: 0 %
Eosinophils Absolute: 0 10*3/uL (ref 0.0–0.5)
Eosinophils Relative: 0 %
HCT: 38.2 % (ref 36.0–46.0)
Hemoglobin: 11.6 g/dL — ABNORMAL LOW (ref 12.0–15.0)
Immature Granulocytes: 1 %
Lymphocytes Relative: 3 %
Lymphs Abs: 0.6 10*3/uL — ABNORMAL LOW (ref 0.7–4.0)
MCH: 29.4 pg (ref 26.0–34.0)
MCHC: 30.4 g/dL (ref 30.0–36.0)
MCV: 96.7 fL (ref 80.0–100.0)
Monocytes Absolute: 0.6 10*3/uL (ref 0.1–1.0)
Monocytes Relative: 3 %
Neutro Abs: 16.4 10*3/uL — ABNORMAL HIGH (ref 1.7–7.7)
Neutrophils Relative %: 93 %
Platelets: 374 10*3/uL (ref 150–400)
RBC: 3.95 MIL/uL (ref 3.87–5.11)
RDW: 13.9 % (ref 11.5–15.5)
WBC: 17.8 10*3/uL — ABNORMAL HIGH (ref 4.0–10.5)
nRBC: 0 % (ref 0.0–0.2)

## 2019-12-24 LAB — COMPREHENSIVE METABOLIC PANEL
ALT: 30 U/L (ref 0–44)
AST: 78 U/L — ABNORMAL HIGH (ref 15–41)
Albumin: 2.8 g/dL — ABNORMAL LOW (ref 3.5–5.0)
Alkaline Phosphatase: 96 U/L (ref 38–126)
Anion gap: 13 (ref 5–15)
BUN: 19 mg/dL (ref 8–23)
CO2: 20 mmol/L — ABNORMAL LOW (ref 22–32)
Calcium: 8.8 mg/dL — ABNORMAL LOW (ref 8.9–10.3)
Chloride: 104 mmol/L (ref 98–111)
Creatinine, Ser: 1.75 mg/dL — ABNORMAL HIGH (ref 0.44–1.00)
GFR calc Af Amer: 30 mL/min — ABNORMAL LOW (ref >60)
GFR calc non Af Amer: 26 mL/min — ABNORMAL LOW (ref >60)
Glucose, Bld: 138 mg/dL — ABNORMAL HIGH (ref 70–99)
Potassium: 4.1 mmol/L (ref 3.5–5.1)
Sodium: 137 mmol/L (ref 135–145)
Total Bilirubin: 1.4 mg/dL — ABNORMAL HIGH (ref 0.3–1.2)
Total Protein: 7.9 g/dL (ref 6.5–8.1)

## 2019-12-24 LAB — URINALYSIS, ROUTINE W REFLEX MICROSCOPIC
Glucose, UA: NEGATIVE mg/dL
Ketones, ur: 15 mg/dL — AB
Nitrite: POSITIVE — AB
Protein, ur: 100 mg/dL — AB
Specific Gravity, Urine: >1.03 — ABNORMAL HIGH (ref 1.005–1.030)
pH: 5 (ref 5.0–8.0)

## 2019-12-24 LAB — PROTIME-INR
INR: 1.3 — ABNORMAL HIGH (ref 0.8–1.2)
Prothrombin Time: 16.1 seconds — ABNORMAL HIGH (ref 11.4–15.2)

## 2019-12-24 LAB — LACTIC ACID, PLASMA
Lactic Acid, Venous: 4.3 mmol/L (ref 0.5–1.9)
Lactic Acid, Venous: 4.7 mmol/L (ref 0.5–1.9)
Lactic Acid, Venous: 4.7 mmol/L (ref 0.5–1.9)
Lactic Acid, Venous: 2.8 mmol/L (ref 0.5–1.9)

## 2019-12-24 LAB — PROCALCITONIN: Procalcitonin: 56.64 ng/mL

## 2019-12-24 LAB — SARS CORONAVIRUS 2 (TAT 6-24 HRS): SARS Coronavirus 2: NEGATIVE

## 2019-12-24 MED ORDER — SODIUM CHLORIDE 0.9 % IV BOLUS (SEPSIS)
1000.0000 mL | Freq: Once | INTRAVENOUS | Status: AC
  Administered 2019-12-24: 1000 mL via INTRAVENOUS

## 2019-12-24 MED ORDER — SODIUM CHLORIDE 0.9 % IV SOLN
2.0000 g | Freq: Once | INTRAVENOUS | Status: AC
  Administered 2019-12-24: 11:00:00 2 g via INTRAVENOUS
  Filled 2019-12-24: qty 2

## 2019-12-24 MED ORDER — VANCOMYCIN HCL IN DEXTROSE 1-5 GM/200ML-% IV SOLN
1000.0000 mg | Freq: Once | INTRAVENOUS | Status: AC
  Administered 2019-12-24: 1000 mg via INTRAVENOUS
  Filled 2019-12-24: qty 200

## 2019-12-24 MED ORDER — SODIUM CHLORIDE 0.9 % IV SOLN
2.0000 g | Freq: Two times a day (BID) | INTRAVENOUS | Status: DC
  Administered 2019-12-24 – 2019-12-27 (×6): 2 g via INTRAVENOUS
  Filled 2019-12-24 (×7): qty 2

## 2019-12-24 MED ORDER — VANCOMYCIN HCL IN DEXTROSE 1-5 GM/200ML-% IV SOLN
1000.0000 mg | INTRAVENOUS | Status: DC
  Administered 2019-12-25 – 2019-12-26 (×2): 1000 mg via INTRAVENOUS
  Filled 2019-12-24 (×2): qty 200

## 2019-12-24 MED ORDER — ACETAMINOPHEN 325 MG RE SUPP
RECTAL | Status: AC
  Filled 2019-12-24: qty 1

## 2019-12-24 MED ORDER — ACETAMINOPHEN 650 MG RE SUPP
RECTAL | Status: AC
  Filled 2019-12-24: qty 1

## 2019-12-24 MED ORDER — METRONIDAZOLE IN NACL 5-0.79 MG/ML-% IV SOLN
500.0000 mg | Freq: Once | INTRAVENOUS | Status: AC
  Administered 2019-12-24: 500 mg via INTRAVENOUS
  Filled 2019-12-24: qty 100

## 2019-12-24 NOTE — ED Notes (Signed)
Patient transported to CT 

## 2019-12-24 NOTE — Consult Note (Signed)
Reason for Consult: rule out vulvar abscess, vaginal bleeding, thickened endometrium, lower uterine segment Lippes Loop in situ. Referring Physician: Ed Provider  Connie Miranda is an 85 y.o. female. She is bedridden x 5+ yrs. She is being admitted for septic w/u , LA 4.2-> 4.7, currently stable pressures on broad spectrum antibiotics. She had a prior CT ABD/:Pelvis, Dec 17 which showed a markedly thickened endometrial lining, with a longstanding Lippes Loop that has migrated inferiorly to to lower uterine segment, distinctly displaced from her 2017 CT where it was in a more normally sized uterus in the center of the endometrial cavity.  The Dec 17 CT raised the question of a small labial abscess, and my exam today shows that she has 2 small pendulous tags of the labia minora, but these are not infected.  She does have a mucoid blood non-odorous vaginal d/c which is suspect is originating from the endometrium, representing ea d/c from an endometrial process that could be endometrial cancer, or a pyometra. If a pyometra, it could be a source of septicemia. Unfortunately she has a massive fecal impaction that fills the pelvis, with overflow fecal incontinence around it, I cannot even feel the cervix on bimanual due to the hard impaction posteriorly. This will need to be evacuated before Gyn could access the cervix for D&C or endometrial biopsy.  Pertinent Gynecological History: Menses: post-menopausal Bleeding:  Contraception:  DES exposure:  Blood transfusions:  Sexually transmitted diseases: no past history Previous GYN Procedures:   Last mammogram: normal Date: 2011 Last pap: none on record Date:  OB History: No obstetric history on file.    Menstrual History: Menarche age:  No LMP recorded. Patient is postmenopausal.    Past Medical History:  Diagnosis Date  . Edema   . Edema of both legs 01/2016  . Glucose-6-phosphate dehydrogenase deficiency    possible diagnosis of G6PD per  patient "phosphate 6 deficiency"  . Hypertension   . Osteoarthritis   . Pernicious anemia   . Stroke [facility]) 09/2000   couldn't move her left foot which resolved    Past Surgical History:  Procedure Laterality Date  . BONE MARROW BIOPSY  12/1966   for anemia  . IR PERC CHOLECYSTOSTOMY  04/08/2017  . IR RADIOLOGIST EVAL & MGMT  05/20/2017  . IR SINUS/FIST TUBE CHK-NON GI  06/11/2017  . MULTIPLE TOOTH EXTRACTIONS    . OTHER SURGICAL HISTORY     cyst from panty line removed as child    Family History  Problem Relation Age of Onset  . Diabetes Brother   . Diabetes Brother   . Heart failure Mother 76  . Heart attack Father 74    Social History:  reports that she quit smoking about 26 years ago. Her smoking use included cigarettes. She has never used smokeless tobacco. She reports that she does not drink alcohol or use drugs.  Allergies:  Allergies  Allergen Reactions  . Asa [Aspirin] Other (See Comments)    Doctor told her not to take due to type anemia  . Phosphate Other (See Comments)    Anything containing phosphate 6. Unknown reaction  . Codeine Other (See Comments)    Made her too sleepy  . Penicillins Itching and Swelling    Tolerates keflex  Did it involve swelling of the face/tongue/throat, SOB, or low BP? unknown Did it involve sudden or severe rash/hives, skin peeling, or any reaction on the inside of your mouth or nose? unknown Did you need to seek  medical attention at a hospital or doctor's office? unknown When did it last happen?years ago. If all above answers are "NO", may proceed with cephalosporin use.     Medications: I have reviewed the patient's current medications.  Review of Systems  Blood pressure (!) 125/48, pulse 82, temperature (!) 103.9 F (39.9 C), temperature source Rectal, resp. rate (!) 28, weight 124.7 kg, SpO2 95 %. Physical Exam  Constitutional:  Morbidly obese deconditioned female bedridden, Body mass index is 47.95 kg/m.    HENT:  Head: Normocephalic.  GI: Soft. There is no abdominal tenderness. There is no guarding.  Genitourinary:    Vaginal discharge present.     Genitourinary Comments: There is a mucoid bloody discharge without malodor at the introitus.  After cleaning the tissues she is noted to have pendulous skin tags off of the labial minora which I think represented the previously suspected labial abscess.  There is no evidence of an abscess.  Vaginal exam shows the bloody mucus discharges originating from up inside the vagina.  Bimanual exam is obstructed by a huge fecal mass consistent with the mass seen on CT which is larger than 10 cm in maximum diameter There is a Lippy loop IUD which is been displaced inferiorly and is resting on the lower uterine segment.  The uterus and vagina have been pushed anteriorly about the large fecal impaction Today's CT shows the endometrial cavity is less distinct and more poorly defined than previously noted on December 17 CT this raises question of her having developed some purulence or infection inside the longstanding endometrial tissue/fluid collection. The uterus does not seem tender on attempted bimanual exam which is limited by body habitus   Musculoskeletal:     Comments: Restricted ability to move legs I was able to abduct the leg sufficiently to carefully inspect labia minora.  I do not believe there is an abscess present in the labial area.  Similarly labia minora and majora are normal for age and body habitus, Legs have chronic desquamation and have obviously been without supportive care for months and months with scaling desquamation of of sloughing skin layers.  Neurological: She is alert.  Skin: Skin is warm and dry.    Results for orders placed or performed during the hospital encounter of 12/24/19 (from the past 48 hour(s))  Comprehensive metabolic panel     Status: Abnormal   Collection Time: 12/24/19 10:30 AM  Result Value Ref Range   Sodium 137 135  - 145 mmol/L   Potassium 4.1 3.5 - 5.1 mmol/L   Chloride 104 98 - 111 mmol/L   CO2 20 (L) 22 - 32 mmol/L   Glucose, Bld 138 (H) 70 - 99 mg/dL   BUN 19 8 - 23 mg/dL   Creatinine, Ser 1.75 (H) 0.44 - 1.00 mg/dL   Calcium 8.8 (L) 8.9 - 10.3 mg/dL   Total Protein 7.9 6.5 - 8.1 g/dL   Albumin 2.8 (L) 3.5 - 5.0 g/dL   AST 78 (H) 15 - 41 U/L   ALT 30 0 - 44 U/L   Alkaline Phosphatase 96 38 - 126 U/L   Total Bilirubin 1.4 (H) 0.3 - 1.2 mg/dL   GFR calc non Af Amer 26 (L) >60 mL/min   GFR calc Af Amer 30 (L) >60 mL/min   Anion gap 13 5 - 15    Comment: Performed at [facility], [place] [address], [place], [place] [address]  Lactic acid, plasma     Status: Abnormal  Collection Time: 12/24/19 10:30 AM  Result Value Ref Range   Lactic Acid, Venous 4.3 (HH) 0.5 - 1.9 mmol/L    Comment: CRITICAL RESULT CALLED TO, READ BACK BY AND VERIFIED WITH: GARRISON, [place] @ 1122 12/24/2019 PERRY, J. Performed at [facility], [place] [address], [place], [place] [address]   CBC with Differential     Status: Abnormal   Collection Time: 12/24/19 10:30 AM  Result Value Ref Range   WBC 17.8 (H) 4.0 - 10.5 K/uL   RBC 3.95 3.87 - 5.11 MIL/uL   Hemoglobin 11.6 (L) 12.0 - 15.0 g/dL   HCT 38.2 36.0 - 46.0 %   MCV 96.7 80.0 - 100.0 fL   MCH 29.4 26.0 - 34.0 pg   MCHC 30.4 30.0 - 36.0 g/dL   RDW 13.9 11.5 - 15.5 %   Platelets 374 150 - 400 K/uL   nRBC 0.0 0.0 - 0.2 %   Neutrophils Relative % 93 %   Neutro Abs 16.4 (H) 1.7 - 7.7 K/uL   Lymphocytes Relative 3 %   Lymphs Abs 0.6 (L) 0.7 - 4.0 K/uL   Monocytes Relative 3 %   Monocytes Absolute 0.6 0.1 - 1.0 K/uL   Eosinophils Relative 0 %   Eosinophils Absolute 0.0 0.0 - 0.5 K/uL   Basophils Relative 0 %   Basophils Absolute 0.1 0.0 - 0.1 K/uL   Immature Granulocytes 1 %   Abs Immature Granulocytes 0.25 (H) 0.00 - 0.07 K/uL    Comment: Performed at [facility], [place] [address] [place], [place]  [address]  Protime-INR     Status: Abnormal   Collection Time: 12/24/19 10:30 AM  Result Value Ref Range   Prothrombin Time 16.1 (H) 11.4 - 15.2 seconds   INR 1.3 (H) 0.8 - 1.2    Comment: (NOTE) INR goal varies based on device and disease states. Performed at [facility], [place] [address], [place], [place] [address]   Urinalysis, Routine w reflex microscopic     Status: Abnormal   Collection Time: 12/24/19 10:30 AM  Result Value Ref Range   Color, Urine ORANGE (A) YELLOW    Comment: BIOCHEMICALS MAY BE AFFECTED BY COLOR   APPearance TURBID (A) CLEAR   Specific Gravity, Urine >1.030 (H) 1.005 - 1.030   pH 5.0 5.0 - 8.0   Glucose, UA NEGATIVE NEGATIVE mg/dL   Hgb urine dipstick LARGE (A) NEGATIVE   Bilirubin Urine MODERATE (A) NEGATIVE   Ketones, ur 15 (A) NEGATIVE mg/dL   Protein, ur 100 (A) NEGATIVE mg/dL   Nitrite POSITIVE (A) NEGATIVE   Leukocytes,Ua TRACE (A) NEGATIVE    Comment: Performed at [facility], [place] [address] [place],  [address]  Urinalysis, Microscopic (reflex)     Status: Abnormal   Collection Time: 12/24/19 10:30 AM  Result Value Ref Range   RBC / HPF 11-20 0 - 5 RBC/hpf   WBC, UA 0-5 0 - 5 WBC/hpf   Bacteria, UA FEW (A) NONE SEEN   Squamous Epithelial / LPF 0-5 0 - 5   Amorphous Crystal PRESENT     Comment: Performed at [facility], [place] [address] [place], [place] [address]  Lactic acid, plasma     Status: Abnormal   Collection Time: 12/24/19  1:58 PM  Result Value Ref Range   Lactic Acid, Venous 4.7 (HH) 0.5 - 1.9 mmol/L    Comment: CRITICAL RESULT CALLED TO, READ BACK BY AND VERIFIED WITH:  [place] Silos, [place] 12/24/19 JM Performed at [facility] [place] [address], [place], [place] [address]     CT ABDOMEN PELVIS WO CONTRAST  Result Date: 12/24/2019 CLINICAL DATA:  History of vaginal abscess. EXAM: CT ABDOMEN AND PELVIS WITHOUT CONTRAST TECHNIQUE: Multidetector CT  imaging of the abdomen and pelvis was performed following the standard protocol without IV contrast. COMPARISON:  12/15/2019 FINDINGS: Lower chest: Unremarkable. Hepatobiliary: No focal abnormality in the liver on this study without intravenous contrast. Gallbladder is collapsed. Gallstones demonstrated by cholangiogram on 06/11/2017 are not evident on this CT. No intrahepatic or extrahepatic biliary dilation. Pancreas: No focal mass lesion. No dilatation of the main duct. No intraparenchymal cyst. No peripancreatic edema. Spleen: No splenomegaly. No focal mass lesion. Adrenals/Urinary Tract: No adrenal nodule or mass. Kidneys unremarkable. No evidence for hydroureter. Bladder decompressed. Stomach/Bowel: Stomach is unremarkable. No gastric wall thickening. No evidence of outlet obstruction. Duodenum is normally positioned as is the ligament of Treitz. No small bowel wall thickening. No small bowel dilatation. The terminal ileum is normal. The appendix is normal. Diverticular changes are noted in the left colon without evidence of diverticulitis. Marked stool volume noted in the rectum with rectal diameter measuring 12.8 x 9.3 cm, similar to prior. No substantial perirectal edema or inflammation at this time. Vascular/Lymphatic: There is abdominal aortic atherosclerosis without aneurysm. There is no gastrohepatic or hepatoduodenal ligament lymphadenopathy. No retroperitoneal or mesenteric lymphadenopathy. No pelvic sidewall lymphadenopathy. Reproductive: IUD visualized in the uterus. Uterus appears thickened and prior study with contrast material demonstrated marked prominence of the endometrium. There is no adnexal mass. The right-sided vaginal fluid collection measured on the previous study appears decreased. There also appears to be a relatively stable fluid collection involving the left labia majora. This is a focal abnormality and there is no edema or inflammation in the soft tissues of the perineum. There is  no gas in the soft tissues of the perineum. Other: No intraperitoneal free fluid. Musculoskeletal: Bones are diffusely demineralized. No worrisome lytic or sclerotic osseous abnormality. IMPRESSION: 1. The previously measured right vaginal fluid collection at 2.8 x 1 point 0 cm is not clearly demonstrated on today's study. There does appear to be a fluid collection in the left labia majora measuring 4.3 x 1.7 cm, similar to prior study. No features on today's study to suggest necrotizing fasciitis. 2. Marked apparent thickening of the endometrium. Neoplasm not excluded. Pelvic ultrasound recommended to further evaluate. 3. Collapsed gallbladder. Gallstones documented on previous cholecystostomy tube injection are not visualized on today's CT. 4.  Aortic Atherosclerois (ICD10-170.0) Electronically Signed   By: Misty Stanley M.D.   On: 12/24/2019 12:44   DG Chest 2 View  Result Date: 12/24/2019 CLINICAL DATA:  Altered mental status, shortness of breath. EXAM: CHEST - 2 VIEW COMPARISON:  None. FINDINGS: The heart size and mediastinal contours are within normal limits. Both lungs are clear. The visualized skeletal structures are stable. IMPRESSION: No active cardiopulmonary disease. Electronically Signed   By: Abelardo Diesel M.D.   On: 12/24/2019 12:02    Assessment/Plan:  1.  No evidence of labial abscess 2.  Extensive endometrial thickening dating back at least till December 17, less distinct now may represent endometrial malignancy or pyometra.  Endometrial sampling or D&C is needed but will require disimpaction before can be considered as it presents the cervix cannot be accessed 3.  Would agree with admission for sepsis evaluation broad-spectrum antibiotics as currently ordered, disimpaction, and consideration of endometrial sampling or full  D&C as soon as reasonable    Jonnie Kind 12/24/2019 On-call GYN accessible at [phone]

## 2019-12-24 NOTE — Progress Notes (Signed)
PHARMACY NOTE -  Cefepime  Pharmacy has been assisting with dosing of cefepime for sepsis.  Dosage currently 2g IV q12 hr   Pharmacy will sign off, following peripherally for culture results or dose adjustments. Please reconsult if a change in clinical status warrants re-evaluation of dosage.  Reuel Boom, PharmD, BCPS [phone] 12/24/2019, 11:27 AM

## 2019-12-24 NOTE — ED Provider Notes (Signed)
Patient signed out to me by G. Green, PA-C.  Please see previous notes for further history  In brief, patient presenting for altered mental status.  On arrival, febrile to 103 and GCS of 12.  Code sepsis was called.  White count of 18.  Initial lactic elevated at 4.3, repeat lactic 4.7.  Initially presumed source was a vulvar abscess which was seen 1 week ago, OB/GYN was consulted.  They evaluated the patient and felt patient did not have a vulvar abscess.  Patient has received fluids, Tylenol, and antibiotics.  Due to elevated lactic, hospitalist requested critical care admission/evaluation.  Dr. Melvyn Novas from critical care evaluated the patient.  Requested 3rd lactic for trending.  Repeat lactic stable at 4.7.  He reevaluated the patient, she currently has a GCS of 15, improved from previous.  Her blood pressure stable and she appears nontoxic.  He does not feel critical care needs to admit, recommends hospitalist admission.  Will call hospitalist for admission.  Discussed with Dr. Jonelle Sidle from tried hospitalist service, patient to be admitted.      Franchot Heidelberg, PA-C 12/24/19 1906    Milton Ferguson, MD 12/25/19 2217

## 2019-12-24 NOTE — H&P (Signed)
History and Physical   Connie Miranda VQQ:[id number] DOB: [birth year] DOA: 12/24/2019  Referring MD/NP/PA: Dr. Tomi Bamberger  PCP: [name], MD   Outpatient Specialists: None  Patient coming from: Home  Chief Complaint: Altered mental status  HPI: Connie Miranda is a 85 y.o. female with medical history significant of CVA, hypertension, osteoarthritis, pernicious anemia, glucose-6-phosphate dehydrogenase deficiency, recent vulval abscess, dysphasia who was brought in from home where she has been bedbound mostly for almost 5 years taking care of by family who reported worsening confusion this morning with suspected hallucinations as well.  Patient unable to give me any history now.  She is completely altered.  Family reported she has been taking Keflex as prescribed last week for cellulitis of the vulva.  Also suspected left lower extremity cellulitis.  She has a follow-up appointment with gynecology on Monday.  Patient could not remember her name and is still not able to do that.  Findings in the ER indicating sepsis.  No obvious source suspected UTI but also suspected vulvar abscess and cellulitis.  Patient is being admitted to the hospital for further work-up...  ED Course: Temperature 103.9 blood pressure 139/97 pulse 106 respirate of 37 and oxygen sat 93% on room air.  Chemistry largely all right except for creatinine 1.75 glucose 138.  Albumin is 2.8.  Lactic acid 4.7.  White count 17.8 hemoglobin 11.6.  Showed moderate bilirubin with large hemoglobin.  Positive nitrite.  Few bacteria and WBC only 0-5.  X-ray showed no acute finding.  Patient is being admitted with sepsis of unknown source.  Review of Systems: As per HPI otherwise 10 point review of systems negative.    Past Medical History:  Diagnosis Date  . Edema   . Edema of both legs 01/2016  . Glucose-6-phosphate dehydrogenase deficiency    possible diagnosis of G6PD per patient "phosphate 6 deficiency"  . Hypertension   .  Osteoarthritis   . Pernicious anemia   . Stroke [facility]) 09/2000   couldn't move her left foot which resolved    Past Surgical History:  Procedure Laterality Date  . BONE MARROW BIOPSY  12/1966   for anemia  . IR PERC CHOLECYSTOSTOMY  04/08/2017  . IR RADIOLOGIST EVAL & MGMT  05/20/2017  . IR SINUS/FIST TUBE CHK-NON GI  06/11/2017  . MULTIPLE TOOTH EXTRACTIONS    . OTHER SURGICAL HISTORY     cyst from panty line removed as child     reports that she quit smoking about 26 years ago. Her smoking use included cigarettes. She has never used smokeless tobacco. She reports that she does not drink alcohol or use drugs.  Allergies  Allergen Reactions  . Asa [Aspirin] Other (See Comments)    Doctor told her not to take due to type anemia  . Phosphate Other (See Comments)    Anything containing phosphate 6. Unknown reaction  . Codeine Other (See Comments)    Made her too sleepy  . Penicillins Itching and Swelling    Tolerates keflex  Did it involve swelling of the face/tongue/throat, SOB, or low BP? unknown Did it involve sudden or severe rash/hives, skin peeling, or any reaction on the inside of your mouth or nose? unknown Did you need to seek medical attention at a hospital or doctor's office? unknown When did it last happen?years ago. If all above answers are "NO", may proceed with cephalosporin use.   . Sodium Phosphate     Family History  Problem Relation Age of Onset  .  Diabetes Brother   . Diabetes Brother   . Heart failure Mother 16  . Heart attack Father 59     Prior to Admission medications   Medication Sig Start Date End Date Taking? Authorizing Provider  acetaminophen (TYLENOL) 325 MG tablet Take 2 tablets (650 mg total) by mouth every 6 (six) hours as needed. 04/11/13  Yes Dellinger, Bobby Rumpf, PA-C  acetaminophen (TYLENOL) 500 MG tablet Take 500 mg by mouth every 6 (six) hours as needed for moderate pain.   Yes [provider]  amLODipine (NORVASC) 10  MG tablet Take 1 tablet (10 mg total) by mouth daily. 03/24/13  Yes Lauree Chandler, NP  Calcium Carbonate-Vitamin D (CALCIUM + D PO) Take 1 tablet by mouth daily.   Yes [provider]  cephALEXin (KEFLEX) 500 MG capsule Take 2 capsules (1,000 mg total) by mouth 3 (three) times daily. 12/15/19  Yes Drenda Freeze, MD  clopidogrel (PLAVIX) 75 MG tablet Take 1 tablet (75 mg total) by mouth daily. 03/24/13  Yes Lauree Chandler, NP  cyanocobalamin (,VITAMIN B-12,) 1000 MCG/ML injection Inject 1 mL (1,000 mcg total) into the muscle every 30 (thirty) days. 03/24/13  Yes Lauree Chandler, NP  isosorbide mononitrate (IMDUR) 120 MG 24 hr tablet Take 120 mg by mouth daily. 03/19/19  Yes [provider]  Multiple Vitamin (MULTIVITAMIN WITH MINERALS) TABS Take 1 tablet by mouth daily. 03/24/13  Yes Lauree Chandler, NP  polyethylene glycol (MIRALAX / GLYCOLAX) 17 g packet Take 17 g by mouth daily. Patient taking differently: Take 17 g by mouth daily as needed for mild constipation.  12/15/19  Yes Drenda Freeze, MD  Biotin 1000 MCG tablet Take 1 tablet (1 mg total) by mouth daily. Patient not taking: Reported on 03/24/2019 03/24/13   Lauree Chandler, NP  furosemide (LASIX) 40 MG tablet Take 1 tablet (40 mg total) by mouth daily with breakfast. Patient not taking: Reported on 12/24/2019 04/11/13   Dellinger, Bobby Rumpf, PA-C  mineral oil-hydrophilic petrolatum (AQUAPHOR) ointment Apply 1 application topically daily. To lower extremities Patient not taking: Reported on 03/24/2019 03/24/13   Lauree Chandler, NP  potassium chloride SA (K-DUR,KLOR-CON) 20 MEQ tablet Take 1 tablet (20 mEq total) by mouth daily. Patient not taking: Reported on 12/24/2019 04/11/13   Dellinger, Bobby Rumpf, PA-C  zolpidem (AMBIEN) 5 MG tablet Take 1 tablet (5 mg total) by mouth at bedtime as needed for sleep (insomnia). Patient not taking: Reported on 12/24/2019 06/14/18   Bonnielee Haff, MD     Physical Exam: Vitals:   12/24/19 1640 12/24/19 1700 12/24/19 1834 12/24/19 1837  BP:  120/60  (!) 137/58  Pulse:  81 83 84  Resp:  (!) 27 (!) 28 (!) 24  Temp: 99 F (37.2 C)     TempSrc: Rectal     SpO2:  100% 100% 100%  Weight:          Constitutional: Confused and nonverbal Vitals:   12/24/19 1640 12/24/19 1700 12/24/19 1834 12/24/19 1837  BP:  120/60  (!) 137/58  Pulse:  81 83 84  Resp:  (!) 27 (!) 28 (!) 24  Temp: 99 F (37.2 C)     TempSrc: Rectal     SpO2:  100% 100% 100%  Weight:       Eyes: PERRL, lids and conjunctivae jaundiced  ENMT: Mucous membranes are dry. Posterior pharynx clear of any exudate or lesions.Normal dentition.  Neck: normal, supple, no masses, no thyromegaly  Respiratory: clear to auscultation bilaterally, no wheezing, no crackles. Normal respiratory effort. No accessory muscle use.  Cardiovascular: Tachycardic, no murmurs / rubs / gallops. No extremity edema. 2+ pedal pulses. No carotid bruits.  Abdomen: no tenderness, no masses palpated. No hepatosplenomegaly. Bowel sounds positive.  Musculoskeletal: no clubbing / cyanosis. No joint deformity upper and lower extremities. Good ROM, no contractures. Normal muscle tone.  Skin: Cellulitis left lower extremity and vulvar lesions noted no induration Neurologic: CN 2-12 grossly intact. Sensation intact, DTR normal. Strength 5/5 in all 4.  Psychiatric: Confused, responds to questions but not coherent.     Labs on Admission: I have personally reviewed following labs and imaging studies  CBC: Recent Labs  Lab 12/24/19 1030  WBC 17.8*  NEUTROABS 16.4*  HGB 11.6*  HCT 38.2  MCV 96.7  PLT 053   Basic Metabolic Panel: Recent Labs  Lab 12/24/19 1030  NA 137  K 4.1  CL 104  CO2 20*  GLUCOSE 138*  BUN 19  CREATININE 1.75*  CALCIUM 8.8*   GFR: CrCl cannot be calculated (Unknown ideal weight.). Liver Function Tests: Recent Labs  Lab 12/24/19 1030  AST 78*  ALT 30  ALKPHOS 96   BILITOT 1.4*  PROT 7.9  ALBUMIN 2.8*   No results for input(s): LIPASE, AMYLASE in the last 168 hours. No results for input(s): AMMONIA in the last 168 hours. Coagulation Profile: Recent Labs  Lab 12/24/19 1030  INR 1.3*   Cardiac Enzymes: No results for input(s): CKTOTAL, CKMB, CKMBINDEX, TROPONINI in the last 168 hours. BNP (last 3 results) No results for input(s): PROBNP in the last 8760 hours. HbA1C: No results for input(s): HGBA1C in the last 72 hours. CBG: No results for input(s): GLUCAP in the last 168 hours. Lipid Profile: No results for input(s): CHOL, HDL, LDLCALC, TRIG, CHOLHDL, LDLDIRECT in the last 72 hours. Thyroid Function Tests: No results for input(s): TSH, T4TOTAL, FREET4, T3FREE, THYROIDAB in the last 72 hours. Anemia Panel: No results for input(s): VITAMINB12, FOLATE, FERRITIN, TIBC, IRON, RETICCTPCT in the last 72 hours. Urine analysis:    Component Value Date/Time   COLORURINE ORANGE (A) 12/24/2019 1030   APPEARANCEUR TURBID (A) 12/24/2019 1030   LABSPEC >1.030 (H) 12/24/2019 1030   PHURINE 5.0 12/24/2019 1030   GLUCOSEU NEGATIVE 12/24/2019 1030   HGBUR LARGE (A) 12/24/2019 1030   BILIRUBINUR MODERATE (A) 12/24/2019 1030   KETONESUR 15 (A) 12/24/2019 1030   PROTEINUR 100 (A) 12/24/2019 1030   UROBILINOGEN 1.0 04/09/2013 0151   NITRITE POSITIVE (A) 12/24/2019 1030   LEUKOCYTESUR TRACE (A) 12/24/2019 1030   Sepsis Labs: _0 (procalcitonin:4,lacticidven:4) ) Recent Results (from the past 240 hour(s))  Blood culture (routine x 2)     Status: None   Collection Time: 12/15/19  6:07 PM   Specimen: BLOOD  Result Value Ref Range Status   Specimen Description   Final    BLOOD Performed at [facility] [address] [place], [place] [address]    Special Requests   Final    BOTTLES DRAWN AEROBIC AND ANAEROBIC Blood Culture results may not be optimal due to an excessive volume of blood received in culture bottles Performed  at [facility] [address], [place], [place] [address]    Culture   Final    NO GROWTH 5 DAYS Performed at [facility], [place] [address] [place], [place] [address]    Report Status 12/20/2019 FINAL  Final  Blood culture (routine x 2)  Status: None   Collection Time: 12/15/19  6:12 PM   Specimen: BLOOD  Result Value Ref Range Status   Specimen Description   Final    BLOOD Performed at [facility] [address], [place], [place] [address]    Special Requests   Final    BOTTLES DRAWN AEROBIC AND ANAEROBIC Blood Culture adequate volume Performed at [facility] [address] [place], [place] [address]    Culture   Final    NO GROWTH 5 DAYS Performed at [facility], [place] [address], [place], [place] [address]    Report Status 12/20/2019 FINAL  Final  Urine culture     Status: None   Collection Time: 12/15/19  8:47 PM   Specimen: Urine, Random  Result Value Ref Range Status   Specimen Description   Final    URINE, RANDOM Performed at [facility] [address], [place], [place] [address]    Special Requests   Final    NONE Performed at [facility], [place] [address], [place], [place] [address]    Culture   Final    NO GROWTH Performed at [facility], [place] [address], [place], [place] [address]    Report Status 12/16/2019 FINAL  Final  Culture, blood (Routine x 2)     Status: None (Preliminary result)   Collection Time: 12/24/19 10:30 AM   Specimen: BLOOD  Result Value Ref Range Status   Specimen Description   Final    BLOOD RIGHT ANTECUBITAL Performed at [facility], [place] [address] [place], [place] [address]    Special Requests   Final    BOTTLES DRAWN AEROBIC AND ANAEROBIC Blood Culture adequate volume Performed at [facility] [address], [place], [place] [address]    Culture PENDING  Incomplete   Report Status  PENDING  Incomplete  Culture, blood (Routine x 2)     Status: None (Preliminary result)   Collection Time: 12/24/19 10:35 AM   Specimen: BLOOD LEFT HAND  Result Value Ref Range Status   Specimen Description   Final    BLOOD LEFT HAND Performed at [facility], [place] [address], [place], [place] [address]    Special Requests   Final    BOTTLES DRAWN AEROBIC AND ANAEROBIC Blood Culture results may not be optimal due to an inadequate volume of blood received in culture bottles Performed at [facility] [address], [place], [place] [address]    Culture PENDING  Incomplete   Report Status PENDING  Incomplete     Radiological Exams on Admission: CT ABDOMEN PELVIS WO CONTRAST  Result Date: 12/24/2019 CLINICAL DATA:  History of vaginal abscess. EXAM: CT ABDOMEN AND PELVIS WITHOUT CONTRAST TECHNIQUE: Multidetector CT imaging of the abdomen and pelvis was performed following the standard protocol without IV contrast. COMPARISON:  12/15/2019 FINDINGS: Lower chest: Unremarkable. Hepatobiliary: No focal abnormality in the liver on this study without intravenous contrast. Gallbladder is collapsed. Gallstones demonstrated by cholangiogram on 06/11/2017 are not evident on this CT. No intrahepatic or extrahepatic biliary dilation. Pancreas: No focal mass lesion. No dilatation of the main duct. No intraparenchymal cyst. No peripancreatic edema. Spleen: No splenomegaly. No focal mass lesion. Adrenals/Urinary Tract: No adrenal nodule or mass. Kidneys unremarkable. No evidence for hydroureter. Bladder decompressed. Stomach/Bowel: Stomach is unremarkable. No gastric wall thickening. No evidence of outlet obstruction. Duodenum is normally positioned as is the ligament  of Treitz. No small bowel wall thickening. No small bowel dilatation. The terminal ileum is normal. The appendix is normal. Diverticular changes are noted in the left colon without evidence of diverticulitis. Marked stool  volume noted in the rectum with rectal diameter measuring 12.8 x 9.3 cm, similar to prior. No substantial perirectal edema or inflammation at this time. Vascular/Lymphatic: There is abdominal aortic atherosclerosis without aneurysm. There is no gastrohepatic or hepatoduodenal ligament lymphadenopathy. No retroperitoneal or mesenteric lymphadenopathy. No pelvic sidewall lymphadenopathy. Reproductive: IUD visualized in the uterus. Uterus appears thickened and prior study with contrast material demonstrated marked prominence of the endometrium. There is no adnexal mass. The right-sided vaginal fluid collection measured on the previous study appears decreased. There also appears to be a relatively stable fluid collection involving the left labia majora. This is a focal abnormality and there is no edema or inflammation in the soft tissues of the perineum. There is no gas in the soft tissues of the perineum. Other: No intraperitoneal free fluid. Musculoskeletal: Bones are diffusely demineralized. No worrisome lytic or sclerotic osseous abnormality. IMPRESSION: 1. The previously measured right vaginal fluid collection at 2.8 x 1 point 0 cm is not clearly demonstrated on today's study. There does appear to be a fluid collection in the left labia majora measuring 4.3 x 1.7 cm, similar to prior study. No features on today's study to suggest necrotizing fasciitis. 2. Marked apparent thickening of the endometrium. Neoplasm not excluded. Pelvic ultrasound recommended to further evaluate. 3. Collapsed gallbladder. Gallstones documented on previous cholecystostomy tube injection are not visualized on today's CT. 4.  Aortic Atherosclerois (ICD10-170.0) Electronically Signed   By: Misty Stanley M.D.   On: 12/24/2019 12:44   DG Chest 2 View  Result Date: 12/24/2019 CLINICAL DATA:  Altered mental status, shortness of breath. EXAM: CHEST - 2 VIEW COMPARISON:  None. FINDINGS: The heart size and mediastinal contours are within  normal limits. Both lungs are clear. The visualized skeletal structures are stable. IMPRESSION: No active cardiopulmonary disease. Electronically Signed   By: Abelardo Diesel M.D.   On: 12/24/2019 12:02      Assessment/Plan Principal Problem:   Sepsis ([place]) Active Problems:   Hypertension   Cellulitis of left lower leg   AKI (acute kidney injury) ([place])   Chronic diastolic CHF (congestive heart failure) ([place])   Acute respiratory failure with hypoxia ([facility])     #1 sepsis: Source unknown.  Suspected UTI versus valvular abscess and cellulitis.  Patient will be admitted and initiated on the sepsis protocol.  Aggressive IV antibiotics and fluid resuscitation.  Fever control and pain control.  Blood cultures obtained.  Urine cultures also sent.  Monitor on follow culture results.  #2 acute kidney injury: Aggressively hydrate and monitor.  #3 hypertension: Continue home regimen.  #4 cellulitis: Vulvar and left lower extremity.  Continue antibiotics as per #1  #5 diastolic dysfunction CHF: Continue home regimen and supportive care   DVT prophylaxis: Heparin Code Status: Full Family Communication: Sister Disposition Plan: To be determined Consults called: None Admission status: Inpatient  Severity of Illness: The appropriate patient status for this patient is INPATIENT. Inpatient status is judged to be reasonable and necessary in order to provide the required intensity of service to ensure the patient's safety. The patient's presenting symptoms, physical exam findings, and initial radiographic and laboratory data in the context of their chronic comorbidities is felt to place them at high risk for further clinical deterioration. Furthermore, it is not anticipated that the patient will  be medically stable for discharge from the hospital within 2 midnights of admission. The following factors support the patient status of inpatient.   " The patient's presenting symptoms include confusion. " The  worrisome physical exam findings include possible cellulitis but confused. " The initial radiographic and laboratory data are worrisome because of sepsis. " The chronic co-morbidities include CVA.   * I certify that at the point of admission it is my clinical judgment that the patient will require inpatient hospital care spanning beyond 2 midnights from the point of admission due to high intensity of service, high risk for further deterioration and high frequency of surveillance required.Barbette Merino MD [facility] Pager [phone]  If 7PM-7AM, please contact night-coverage [email or web address] Password [facility]  12/24/2019, 7:17 PM

## 2019-12-24 NOTE — Progress Notes (Signed)
Pharmacy Antibiotic Note  Connie Miranda is a 85 y.o. female admitted on 12/24/2019 with sepsis.  Pharmacy has been consulted for vancomycin dosing.  Plan:  Vancomycin 1000 + 1000 mg today, then 1000 mg IV q36 hr (est AUC 505 based on SCr 1.75)  Measure vancomycin AUC at steady state as indicated  SCr q48 while on vanc   Weight: 275 lb (124.7 kg)  Temp (24hrs), Avg:101.5 F (38.6 C), Min:99 F (37.2 C), Max:103.9 F (39.9 C)  Recent Labs  Lab 12/24/19 1030 12/24/19 1358 12/24/19 1640  WBC 17.8*  --   --   CREATININE 1.75*  --   --   LATICACIDVEN 4.3* 4.7* 4.7*    CrCl cannot be calculated (Unknown ideal weight.).    Allergies  Allergen Reactions  . Asa [Aspirin] Other (See Comments)    Doctor told her not to take due to type anemia  . Phosphate Other (See Comments)    Anything containing phosphate 6. Unknown reaction  . Codeine Other (See Comments)    Made her too sleepy  . Penicillins Itching and Swelling    Tolerates keflex  Did it involve swelling of the face/tongue/throat, SOB, or low BP? unknown Did it involve sudden or severe rash/hives, skin peeling, or any reaction on the inside of your mouth or nose? unknown Did you need to seek medical attention at a hospital or doctor's office? unknown When did it last happen?years ago. If all above answers are "NO", may proceed with cephalosporin use.   . Sodium Phosphate      Thank you for allowing pharmacy to be a part of this patient's care.  Connie Miranda A 12/24/2019 7:44 PM

## 2019-12-24 NOTE — Consult Note (Signed)
PULMONARY / CRITICAL CARE MEDICINE   NAME:  Connie Miranda, MRN:  [id number], DOB:  07/27/1933, LOS: 0 ADMISSION DATE:  12/24/2019, CONSULTATION DATE:  12/24/2019 REFERRING MD:  Ed[, CHIEF COMPLAINT:  Weal  Septic shock  BRIEF HISTORY:    52 yobf quit smoking 26 y ago  mobidly obese   bedridden x 5 years but still able to live at home with care taker wears diapers / not able to get up bsc/ to ER am 12/26 with AMS with ? Cellulitis LE's vs  Vulvar abscess with fever 102 and low bp resp to fluids but lactate rose p admit so PCCM asked to see.    HISTORY OF PRESENT ILLNESS   First obtained history from her daughter, Connie Miranda, who lives in [place].  Evidently patient has been bedbound for approximately 5 years.  Apparently  was okay last night and has been taking her Keflex, as prescribed, with planned GYN appointment for 12/28.  Am 12/26 however she woke up and was having increased work of breathing and mild cough and per caretaker patient did not remember her name and was acting particularly confused and altered.  On my arrival pt knows name and yesterday was xmas but does not know day of week. Denies pain anywhere, no sob or cough  No obvious day to day or daytime variability or reports ofexcess/ purulent sputum or mucus plugs or hemoptysis or cp or chest tightness, subjective wheeze or overt sinus or hb symptoms.   Apparently sleeping flat without nocturnal  or early am exacerbation  of respiratory  c/o's or need for noct saba. Also denies any obvious fluctuation of symptoms with weather or environmental changes or other aggravating or alleviating factors except as outlined above   No unusual exposure hx or h/o childhood pna/ asthma or knowledge of premature birth.  Current Allergies, Complete Past Medical History, Past Surgical History, Family History, and Social History were reviewed in [employer] record.  ROS  The following are not active complaints unless  bolded Hoarseness, sore throat, dysphagia, dental problems, itching, sneezing,  nasal congestion or discharge of excess mucus or purulent secretions, ear ache,   fever, chills, sweats, unintended wt loss or wt gain, classically pleuritic or exertional cp,  orthopnea pnd or arm/hand swelling  or leg swelling, presyncope, palpitations, abdominal pain, anorexia, nausea, vomiting, diarrhea  or change in bowel habits= constipation or diarrhea  or change in bladder habits, change in stools or change in urine, dysuria, hematuria,  rash, arthralgias, visual complaints, headache, numbness, weakness or ataxia or problems with walking or coordination,  change in mood or  memory.           SIGNIFICANT PAST MEDICAL HISTORY   Morbid obesity Edema of both legs (01/2016), Glucose-6-phosphate dehydrogenase deficiency, Hypertension, Osteoarthritis, Pernicious anemia, and Stroke (Connie Miranda) (09/2000).   SIGNIFICANT EVENTS:    STUDIES:   CT  Abd/pelvis 12/26: 1. The previously measured right vaginal fluid collection at 2.8 x 1 point 0 cm is not clearly demonstrated on today's study. There does appear to be a fluid collection in the left labia majora measuring 4.3 x 1.7 cm, similar to prior study. No features on today's study to suggest necrotizing fasciitis. 2. Marked apparent thickening of the endometrium. Neoplasm not excluded. Pelvic ultrasound recommended to further evaluate. 3. Collapsed gallbladder. Gallstones documented on previous cholecystostomy tube injection are not visualized on today's CT. 4.  Aortic Atherosclerois (ICD10-170.0)   CULTURES:  covid swab 12/26>>>  bc x 2  12/26 >>> uc 12/26 >>>  ANTIBIOTICS:  maxipime 12/26 >>> Flagyl 12/26 >>> Vanc 12/26 >>>  LINES/TUBES:    CONSULTANTS:  GYN  :  [place]erguson:  Dx fecal impaction/ no abscess   SUBJECTIVE:  No specific complaints   CONSTITUTIONAL: BP (!) 125/48   Pulse 82   Temp (!) 103.9 F (39.9 C) (Rectal)   Resp (!) 28   Wt 124.7 kg    SpO2 95%   BMI 47.95 kg/m   On 2lpm   No intake/output data recorded.           PHYSICAL EXAM: General:  obese bf lying quietly in bed on NP 2lpm  Neuro:  Sluggish, oriented to person, place, date not day of week HEENT:  orophx clear/ neck supple, no nodes  Cardiovascular:  RRR pulse 90s Lungs:  slt decreased bs, no wheeze  Abdomen:  Obese/ soft  Musculoskeletal:  No clubbing/  Skin:  Elephantiasis both LE with scaling/ hyperpigmentation   CXR PA and Lateral:   12/24/2019 :    I personally reviewed images and agree with radiology impression as follows:    No active cardiopulmonary disease.    RESOLVED PROBLEM LIST   ASSESSMENT AND PLAN   1)  AMS/ fever, low BP and elevated Lactic acid c/w low grade sepsis ? From cellulitis ? From UTI - note on norvasc 10 mg daily (very long half life will take a few days to wear off)  >>> complete vol challenge, then repeat Lactic acid 3rd set and if not correcting then can place on CCM service / neosynephrine prn   2) Morbid obesity/ severely debilitated and a set up for infection and DVT/ PE   3) AKI Lab Results  Component Value Date   CREATININE 1.75 (H) 12/24/2019   CREATININE 0.60 12/15/2019   CREATININE 0.61 12/15/2019   likely related to low BP ? What vol status was prior to infection (suspect poor po intake pre acute deterioration) with cxr clear keep well hydrated and off nephrotoxins and this should correct itself/ monitor uop with foley     4) Probable acute on chronic cellulitis  - needs venous dopplers to be complete though suspect due to obesity this will be low quality.    LABS  Glucose No results for input(s): GLUCAP in the last 168 hours.  BMET Recent Labs  Lab 12/24/19 1030  NA 137  K 4.1  CL 104  CO2 20*  BUN 19  CREATININE 1.75*  GLUCOSE 138*    Liver Enzymes Recent Labs  Lab 12/24/19 1030  AST 78*  ALT 30  ALKPHOS 96  BILITOT 1.4*  ALBUMIN 2.8*    Electrolytes Recent Labs  Lab  12/24/19 1030  CALCIUM 8.8*    CBC Recent Labs  Lab 12/24/19 1030  WBC 17.8*  HGB 11.6*  HCT 38.2  PLT 374    ABG No results for input(s): PHART, PCO2ART, PO2ART in the last 168 hours.  Coag's Recent Labs  Lab 12/24/19 1030  INR 1.3*    Sepsis Markers Recent Labs  Lab 12/24/19 1030 12/24/19 1358  LATICACIDVEN 4.3* 4.7*    Cardiac Enzymes No results for input(s): TROPONINI, PROBNP in the last 168 hours.  PAST MEDICAL HISTORY :   She  has a past medical history of Edema, Edema of both legs (01/2016), Glucose-6-phosphate dehydrogenase deficiency, Hypertension, Osteoarthritis, Pernicious anemia, and Stroke ([place]) (09/2000).  PAST SURGICAL HISTORY:  She  has a past surgical history that includes Other surgical history;  Bone marrow biopsy (12/1966); Multiple tooth extractions; IR Perc Cholecystostomy (04/08/2017); IR Sinus/Fist Tube Chk-Non GI (06/11/2017); and IR Radiologist Eval & Mgmt (05/20/2017).  Allergies  Allergen Reactions  . Asa [Aspirin] Other (See Comments)    Doctor told her not to take due to type anemia  . Phosphate Other (See Comments)    Anything containing phosphate 6. Unknown reaction  . Codeine Other (See Comments)    Made her too sleepy  . Penicillins Itching and Swelling    Tolerates keflex  Did it involve swelling of the face/tongue/throat, SOB, or low BP? unknown Did it involve sudden or severe rash/hives, skin peeling, or any reaction on the inside of your mouth or nose? unknown Did you need to seek medical attention at a hospital or doctor's office? unknown When did it last happen?years ago. If all above answers are "NO", may proceed with cephalosporin use.   . Sodium Phosphate     No current facility-administered medications on file prior to encounter.   Current Outpatient Medications on File Prior to Encounter  Medication Sig  . acetaminophen (TYLENOL) 325 MG tablet Take 2 tablets (650 mg total) by mouth every 6 (six) hours as  needed.  Marland Kitchen acetaminophen (TYLENOL) 500 MG tablet Take 500 mg by mouth every 6 (six) hours as needed for moderate pain.  Marland Kitchen amLODipine (NORVASC) 10 MG tablet Take 1 tablet (10 mg total) by mouth daily.  . Calcium Carbonate-Vitamin D (CALCIUM + D PO) Take 1 tablet by mouth daily.  . cephALEXin (KEFLEX) 500 MG capsule Take 2 capsules (1,000 mg total) by mouth 3 (three) times daily.  . clopidogrel (PLAVIX) 75 MG tablet Take 1 tablet (75 mg total) by mouth daily.  . cyanocobalamin (,VITAMIN B-12,) 1000 MCG/ML injection Inject 1 mL (1,000 mcg total) into the muscle every 30 (thirty) days.  . isosorbide mononitrate (IMDUR) 120 MG 24 hr tablet Take 120 mg by mouth daily.  . Multiple Vitamin (MULTIVITAMIN WITH MINERALS) TABS Take 1 tablet by mouth daily.  . polyethylene glycol (MIRALAX / GLYCOLAX) 17 g packet Take 17 g by mouth daily. (Patient taking differently: Take 17 g by mouth daily as needed for mild constipation. )  . Biotin 1000 MCG tablet Take 1 tablet (1 mg total) by mouth daily. (Patient not taking: Reported on 03/24/2019)  . furosemide (LASIX) 40 MG tablet Take 1 tablet (40 mg total) by mouth daily with breakfast. (Patient not taking: Reported on 12/24/2019)  . mineral oil-hydrophilic petrolatum (AQUAPHOR) ointment Apply 1 application topically daily. To lower extremities (Patient not taking: Reported on 03/24/2019)  . potassium chloride SA (K-DUR,KLOR-CON) 20 MEQ tablet Take 1 tablet (20 mEq total) by mouth daily. (Patient not taking: Reported on 12/24/2019)  . zolpidem (AMBIEN) 5 MG tablet Take 1 tablet (5 mg total) by mouth at bedtime as needed for sleep (insomnia). (Patient not taking: Reported on 12/24/2019)    FAMILY HISTORY:   Her family history includes Diabetes in her brother and brother; Heart attack (age of onset: 70) in her father; Heart failure (age of onset: 70) in her mother.  SOCIAL HISTORY:  She  reports that she quit smoking about 26 years ago. Her smoking use included  cigarettes. She has never used smokeless tobacco. She reports that she does not drink alcohol or use drugs.    Christinia Gully, MD Pulmonary and [place] [phone] After 5:30 PM or weekends, use [facility] [phone]

## 2019-12-24 NOTE — ED Provider Notes (Signed)
[place] DEPT Provider Note   CSN: [id number] Arrival date & time: 12/24/19  1006     History Chief Complaint  Patient presents with  . Altered Mental Status    Connie Miranda is a 85 y.o. female with PMH significant for CVA on Plavix, HTN, and recent ED visit for vulvar abscess who presents to the ED via EMS for family reported AMS beginning this morning.  GCS 13 on arrival.  Patient is unable to answer questions or communicate well due to altered state.    First obtained history from her daughter, [name], who lives in [place].  Evidently patient has been bedbound for approximately 5 years.  She referred me to their family friend, Lewie Loron, who takes care of the patient each night.  Spoke with Lewie Loron who reports that she was okay last night and has been taking her Keflex, as prescribed, with planned GYN appointment for Monday.  This morning however she woke up and was having increased work of breathing and mild cough.  Joycelyn Schmid also reports that patient did not remember her name and was acting particularly confused and altered.  She has been working with the family for nearly 4 years and has never seen the patient like this before.  She also reports that EMS took her temperature on site which was 102F.  Patient is level 5 caveat due to AMS.     HPI     Past Medical History:  Diagnosis Date  . Edema   . Edema of both legs 01/2016  . Glucose-6-phosphate dehydrogenase deficiency    possible diagnosis of G6PD per patient "phosphate 6 deficiency"  . Hypertension   . Osteoarthritis   . Pernicious anemia   . Stroke [facility]) 09/2000   couldn't move her left foot which resolved    Patient Active Problem List   Diagnosis Date Noted  . Shock circulatory ([place]) 12/24/2019  . Chronic diastolic CHF (congestive heart failure) ([place]) 06/10/2018  . Sepsis ([place]) 06/10/2018  . Acute respiratory failure with hypoxia ([place]) 06/10/2018  . Hypokalemia  06/10/2018  . Acute bronchitis 06/10/2018  . Lower extremity weakness 02/11/2016  . Difficulty walking 04/08/2013  . Weakness of both legs 04/08/2013  . Morbid obesity ([place]) 04/08/2013  . AKI (acute kidney injury) ([place]) 01/28/2013  . Hyperkalemia 01/28/2013  . Fall 01/28/2013  . Nausea 01/28/2013  . Lymphedema 10/16/2012  . Cellulitis 10/16/2012  . Diarrhea 10/13/2012  . Cellulitis of left lower leg 10/12/2012  . Anemia 10/12/2012  . Abdominal pain 10/12/2012  . Hip pain 10/12/2012  . Hypertension   . Edema   . Pernicious anemia   . Glucose-6-phosphate dehydrogenase deficiency   . Osteoarthritis   . Stroke [facility]) 09/28/2000    Past Surgical History:  Procedure Laterality Date  . BONE MARROW BIOPSY  12/1966   for anemia  . IR PERC CHOLECYSTOSTOMY  04/08/2017  . IR RADIOLOGIST EVAL & MGMT  05/20/2017  . IR SINUS/FIST TUBE CHK-NON GI  06/11/2017  . MULTIPLE TOOTH EXTRACTIONS    . OTHER SURGICAL HISTORY     cyst from panty line removed as child     OB History   No obstetric history on file.     Family History  Problem Relation Age of Onset  . Diabetes Brother   . Diabetes Brother   . Heart failure Mother 51  . Heart attack Father 30    Social History   Tobacco Use  . Smoking status: Former Smoker  Types: Cigarettes    Quit date: 12/29/1993    Years since quitting: 26.0  . Smokeless tobacco: Never Used  Substance Use Topics  . Alcohol use: No  . Drug use: No    Home Medications Prior to Admission medications   Medication Sig Start Date End Date Taking? Authorizing Provider  acetaminophen (TYLENOL) 325 MG tablet Take 2 tablets (650 mg total) by mouth every 6 (six) hours as needed. 04/11/13  Yes Dellinger, Bobby Rumpf, PA-C  acetaminophen (TYLENOL) 500 MG tablet Take 500 mg by mouth every 6 (six) hours as needed for moderate pain.   Yes [provider]  amLODipine (NORVASC) 10 MG tablet Take 1 tablet (10 mg total) by mouth daily. 03/24/13  Yes Lauree Chandler, NP  Calcium Carbonate-Vitamin D (CALCIUM + D PO) Take 1 tablet by mouth daily.   Yes [provider]  cephALEXin (KEFLEX) 500 MG capsule Take 2 capsules (1,000 mg total) by mouth 3 (three) times daily. 12/15/19  Yes Drenda Freeze, MD  clopidogrel (PLAVIX) 75 MG tablet Take 1 tablet (75 mg total) by mouth daily. 03/24/13  Yes Lauree Chandler, NP  cyanocobalamin (,VITAMIN B-12,) 1000 MCG/ML injection Inject 1 mL (1,000 mcg total) into the muscle every 30 (thirty) days. 03/24/13  Yes Lauree Chandler, NP  isosorbide mononitrate (IMDUR) 120 MG 24 hr tablet Take 120 mg by mouth daily. 03/19/19  Yes [provider]  Multiple Vitamin (MULTIVITAMIN WITH MINERALS) TABS Take 1 tablet by mouth daily. 03/24/13  Yes Lauree Chandler, NP  polyethylene glycol (MIRALAX / GLYCOLAX) 17 g packet Take 17 g by mouth daily. Patient taking differently: Take 17 g by mouth daily as needed for mild constipation.  12/15/19  Yes Drenda Freeze, MD  Biotin 1000 MCG tablet Take 1 tablet (1 mg total) by mouth daily. Patient not taking: Reported on 03/24/2019 03/24/13   Lauree Chandler, NP  furosemide (LASIX) 40 MG tablet Take 1 tablet (40 mg total) by mouth daily with breakfast. Patient not taking: Reported on 12/24/2019 04/11/13   Dellinger, Bobby Rumpf, PA-C  mineral oil-hydrophilic petrolatum (AQUAPHOR) ointment Apply 1 application topically daily. To lower extremities Patient not taking: Reported on 03/24/2019 03/24/13   Lauree Chandler, NP  potassium chloride SA (K-DUR,KLOR-CON) 20 MEQ tablet Take 1 tablet (20 mEq total) by mouth daily. Patient not taking: Reported on 12/24/2019 04/11/13   Dellinger, Bobby Rumpf, PA-C  zolpidem (AMBIEN) 5 MG tablet Take 1 tablet (5 mg total) by mouth at bedtime as needed for sleep (insomnia). Patient not taking: Reported on 12/24/2019 06/14/18   Bonnielee Haff, MD    Allergies    Asa [aspirin], Phosphate, Codeine, Penicillins, and Sodium  phosphate  Review of Systems   Review of Systems  Unable to perform ROS: Mental status change  All other systems reviewed and are negative.   Physical Exam Updated Vital Signs BP 120/60   Pulse 81   Temp (!) 103.9 F (39.9 C) (Rectal)   Resp (!) 27   Wt 124.7 kg   SpO2 100%   BMI 47.95 kg/m   Physical Exam Vitals and nursing note reviewed. Exam conducted with a chaperone present.  Constitutional:      Appearance: She is obese.  HENT:     Head: Normocephalic and atraumatic.  Eyes:     General: No scleral icterus.    Extraocular Movements: Extraocular movements intact.     Conjunctiva/sclera: Conjunctivae normal.     Pupils: Pupils are  equal, round, and reactive to light.  Cardiovascular:     Rate and Rhythm: Regular rhythm. Tachycardia present.     Pulses: Normal pulses.     Heart sounds: Normal heart sounds.  Pulmonary:     Comments: Tachypneic with mildly increased work of breathing.  Mild wheezing bilaterally. Genitourinary:    Comments: No obvious drainable abscess or purulence noted. Musculoskeletal:     Comments: Chronic venous stasis on lower legs bilaterally.  Skin:    General: Skin is dry.  Neurological:     Mental Status: She is alert. She is disoriented.     GCS: GCS eye subscore is 4. GCS verbal subscore is 3. GCS motor subscore is 5.  Psychiatric:        Mood and Affect: Mood normal.        Behavior: Behavior normal.        Thought Content: Thought content normal.     ED Results / Procedures / Treatments   Labs (all labs ordered are listed, but only abnormal results are displayed) Labs Reviewed  COMPREHENSIVE METABOLIC PANEL - Abnormal; Notable for the following components:      Result Value   CO2 20 (*)    Glucose, Bld 138 (*)    Creatinine, Ser 1.75 (*)    Calcium 8.8 (*)    Albumin 2.8 (*)    AST 78 (*)    Total Bilirubin 1.4 (*)    GFR calc non Af Amer 26 (*)    GFR calc Af Amer 30 (*)    All other components within normal limits   LACTIC ACID, PLASMA - Abnormal; Notable for the following components:   Lactic Acid, Venous 4.3 (*)    All other components within normal limits  LACTIC ACID, PLASMA - Abnormal; Notable for the following components:   Lactic Acid, Venous 4.7 (*)    All other components within normal limits  CBC WITH DIFFERENTIAL/PLATELET - Abnormal; Notable for the following components:   WBC 17.8 (*)    Hemoglobin 11.6 (*)    Neutro Abs 16.4 (*)    Lymphs Abs 0.6 (*)    Abs Immature Granulocytes 0.25 (*)    All other components within normal limits  PROTIME-INR - Abnormal; Notable for the following components:   Prothrombin Time 16.1 (*)    INR 1.3 (*)    All other components within normal limits  URINALYSIS, ROUTINE W REFLEX MICROSCOPIC - Abnormal; Notable for the following components:   Color, Urine ORANGE (*)    APPearance TURBID (*)    Specific Gravity, Urine >1.030 (*)    Hgb urine dipstick LARGE (*)    Bilirubin Urine MODERATE (*)    Ketones, ur 15 (*)    Protein, ur 100 (*)    Nitrite POSITIVE (*)    Leukocytes,Ua TRACE (*)    All other components within normal limits  URINALYSIS, MICROSCOPIC (REFLEX) - Abnormal; Notable for the following components:   Bacteria, UA FEW (*)    All other components within normal limits  LACTIC ACID, PLASMA - Abnormal; Notable for the following components:   Lactic Acid, Venous 4.7 (*)    All other components within normal limits  CULTURE, BLOOD (ROUTINE X 2)  CULTURE, BLOOD (ROUTINE X 2)  URINE CULTURE  SARS CORONAVIRUS 2 (TAT 6-24 HRS)  APTT  LACTIC ACID, PLASMA  PROCALCITONIN  CBC    EKG EKG Interpretation  Date/Time:  Saturday December 24 2019 13:40:09 EST Ventricular Rate:  85 PR Interval:  QRS Duration: 93 QT Interval:  355 QTC Calculation: 423 R Axis:   2 Text Interpretation: Sinus rhythm Borderline prolonged PR interval Low voltage, precordial leads Probable anteroseptal infarct, old Nonspecific T abnormalities, lateral leads  Poor data quality Confirmed by Dorie Rank [phone]) on 12/24/2019 1:47:02 PM   Radiology CT ABDOMEN PELVIS WO CONTRAST  Result Date: 12/24/2019 CLINICAL DATA:  History of vaginal abscess. EXAM: CT ABDOMEN AND PELVIS WITHOUT CONTRAST TECHNIQUE: Multidetector CT imaging of the abdomen and pelvis was performed following the standard protocol without IV contrast. COMPARISON:  12/15/2019 FINDINGS: Lower chest: Unremarkable. Hepatobiliary: No focal abnormality in the liver on this study without intravenous contrast. Gallbladder is collapsed. Gallstones demonstrated by cholangiogram on 06/11/2017 are not evident on this CT. No intrahepatic or extrahepatic biliary dilation. Pancreas: No focal mass lesion. No dilatation of the main duct. No intraparenchymal cyst. No peripancreatic edema. Spleen: No splenomegaly. No focal mass lesion. Adrenals/Urinary Tract: No adrenal nodule or mass. Kidneys unremarkable. No evidence for hydroureter. Bladder decompressed. Stomach/Bowel: Stomach is unremarkable. No gastric wall thickening. No evidence of outlet obstruction. Duodenum is normally positioned as is the ligament of Treitz. No small bowel wall thickening. No small bowel dilatation. The terminal ileum is normal. The appendix is normal. Diverticular changes are noted in the left colon without evidence of diverticulitis. Marked stool volume noted in the rectum with rectal diameter measuring 12.8 x 9.3 cm, similar to prior. No substantial perirectal edema or inflammation at this time. Vascular/Lymphatic: There is abdominal aortic atherosclerosis without aneurysm. There is no gastrohepatic or hepatoduodenal ligament lymphadenopathy. No retroperitoneal or mesenteric lymphadenopathy. No pelvic sidewall lymphadenopathy. Reproductive: IUD visualized in the uterus. Uterus appears thickened and prior study with contrast material demonstrated marked prominence of the endometrium. There is no adnexal mass. The right-sided vaginal fluid  collection measured on the previous study appears decreased. There also appears to be a relatively stable fluid collection involving the left labia majora. This is a focal abnormality and there is no edema or inflammation in the soft tissues of the perineum. There is no gas in the soft tissues of the perineum. Other: No intraperitoneal free fluid. Musculoskeletal: Bones are diffusely demineralized. No worrisome lytic or sclerotic osseous abnormality. IMPRESSION: 1. The previously measured right vaginal fluid collection at 2.8 x 1 point 0 cm is not clearly demonstrated on today's study. There does appear to be a fluid collection in the left labia majora measuring 4.3 x 1.7 cm, similar to prior study. No features on today's study to suggest necrotizing fasciitis. 2. Marked apparent thickening of the endometrium. Neoplasm not excluded. Pelvic ultrasound recommended to further evaluate. 3. Collapsed gallbladder. Gallstones documented on previous cholecystostomy tube injection are not visualized on today's CT. 4.  Aortic Atherosclerois (ICD10-170.0) Electronically Signed   By: Misty Stanley M.D.   On: 12/24/2019 12:44   DG Chest 2 View  Result Date: 12/24/2019 CLINICAL DATA:  Altered mental status, shortness of breath. EXAM: CHEST - 2 VIEW COMPARISON:  None. FINDINGS: The heart size and mediastinal contours are within normal limits. Both lungs are clear. The visualized skeletal structures are stable. IMPRESSION: No active cardiopulmonary disease. Electronically Signed   By: Abelardo Diesel M.D.   On: 12/24/2019 12:02    Procedures .Critical Care Performed by: Corena Herter, PA-C Authorized by: Corena Herter, PA-C   Critical care provider statement:    Critical care time (minutes):  45   Critical care was necessary to treat or prevent imminent or life-threatening deterioration of the  following conditions:  Sepsis   Critical care was time spent personally by me on the following activities:  Discussions  with consultants, evaluation of patient's response to treatment, examination of patient, ordering and performing treatments and interventions, ordering and review of laboratory studies, ordering and review of radiographic studies, pulse oximetry, re-evaluation of patient's condition, obtaining history from patient or surrogate and review of old charts   (including critical care time)  Medications Ordered in ED Medications  acetaminophen (TYLENOL) 325 MG suppository (  Canceled Entry 12/24/19 1118)  ceFEPIme (MAXIPIME) 2 g in sodium chloride 0.9 % 100 mL IVPB (has no administration in time range)  ceFEPIme (MAXIPIME) 2 g in sodium chloride 0.9 % 100 mL IVPB (0 g Intravenous Stopped 12/24/19 1137)  metroNIDAZOLE (FLAGYL) IVPB 500 mg (0 mg Intravenous Stopped 12/24/19 1453)  vancomycin (VANCOCIN) IVPB 1000 mg/200 mL premix (0 mg Intravenous Stopped 12/24/19 1209)  acetaminophen (TYLENOL) 650 MG suppository (  Given 12/24/19 1114)  sodium chloride 0.9 % bolus 1,000 mL (0 mLs Intravenous Stopped 12/24/19 1346)    And  sodium chloride 0.9 % bolus 1,000 mL (0 mLs Intravenous Stopped 12/24/19 1355)    And  sodium chloride 0.9 % bolus 1,000 mL (1,000 mLs Intravenous New Bag/Given 12/24/19 1354)    And  sodium chloride 0.9 % bolus 1,000 mL (1,000 mLs Intravenous New Bag/Given 12/24/19 1329)    ED Course  I have reviewed the triage vital signs and the nursing notes.  Pertinent labs & imaging results that were available during my care of the patient were reviewed by me and considered in my medical decision making (see chart for details).  Clinical Course as of Dec 23 1728  Sat Dec 24, 2019  1353 Spoke with Dr. Lupita Leash with [facility] who would like to see repeat lactic acid and consult with GYN.    [GG]  35 Spoke with Dr. Glo Herring, on-call GYN, who agrees that new 4.3 x 1.7 cm fluid collection in the left leg is likely responsible for sepsis.  He will come to the ED for evaluation and hopeful for I&D.    [GG]  1456 Lactic acid worsened to 4.7. Spoke with Dr. Lupita Leash and he requests that she be admitted through critical care.   [GG]  1511 Spoke with critical care, Dr. Melvyn Novas, who will evaluate the patient but feels as though still candidate for hospitalist admission despite worsening lactic acid.   [GG]  26 Dr. Glo Herring does not believe patient has a labial abscess, but rather fecal impaction and enlarged uterus that will require D&C.    [GG]    Clinical Course User Index [GG] Corena Herter, PA-C   MDM Rules/Calculators/A&P                      I reviewed patient's past medical record and her recent evaluation on 12/15/2019 for vulvar abscess. CT abdomen and pelvis obtained with contrast that day demonstrated 2.8 x 1 cm fluid collection in right vulva concerning for abscess.  Dr. Roland Rack from GYN was consulted and mutually decided not to drain abscess.  Patient was treated with Rocephin in the ED and discharged with Keflex and GYN follow-up.  Joycelyn Schmid has been giving patient her antibiotics, as prescribed.  Patient presents to the ED febrile to 103.9 F, tachycardic, tachypnea.  Sepsis protocol initiated.  CMP demonstrated new AKI with creatinine elevated to 1.75 and GFR down to 26, which was > 60 just 9 days ago.  Patient has  a new leukocytosis to 17.8 with left shift.  Lactic acid elevated to 4.3.  Patient started empirically on cefepime, metronidazole, and vancomycin.  Tylenol given for fever control.  Fluids administered at 30 mg/kg.  Chest x-ray demonstrates no active cardiopulmonary disease and her repeat CT abdomen pelvis obtained without contrast due to AKI demonstrates no evidence of necrotizing fasciitis.  It does reveal a new 4.3 x 1.7 cm fluid collection in the left labia majora concerning for possible abscess as well as marked thickening of endometrium concerning for neoplasm that will ultimately require ultrasound for further work-up.  UA demonstrates nitrite positive and possible UTI.  However, patient has been regularly taking her keflex medication prescribed for vulvar abscess.   On reassessment, patient is improved to GCS 15.  She is able to move her extremities, as directed.  She is able to communicate without difficulty and no longer appears confused.  Sensation intact throughout.  She denies any chest pain, shortness of breath, headache, dizziness, sore throat, abdominal pain, nausea, dysuria, or other symptoms.  She endorses weakness and chills.   Patient will likely be admitted for sepsis, possibly due to 4.3 x 1.7 cm fluid collection in the left labia concerning for abscess.  Spoke with Dr. Lupita Leash from [facility] hospitalist group who recommended that I reach out to on-call GYN.  Dr. Glo Herring, on-call GYN, agrees with Dr. Lupita Leash that abscess likely responsible.  Will evaluate patient for possible I&D.  Repeat lactic acid increased to 4.7. Dr. Lupita Leash requests that patient be admitted through critical care.  Patient has improved during her stay and is no longer febrile.  At shift change care was transferred to [facility], PA-C who will follow third lactic acid which has already been drawn to determine whether or not patient will need to be admitted to critical care or to hospitalist service for ongoing evaluation and management.    Final Clinical Impression(s) / ED Diagnoses Final diagnoses:  SIRS (systemic inflammatory response syndrome) [facility])    Rx / DC Orders ED Discharge Orders    None       Corena Herter, PA-C 12/24/19 [place], [place], DO 12/25/19 [id number]

## 2019-12-24 NOTE — ED Notes (Signed)
CRITICAL VALUE ALERT  Critical Value:  Lactic acid 4.7 mmol/l  Date & Time Notied:  12/24/2019 1420  Provider Notified: Nyoka Cowden PA  Orders Received/Actions taken: Provider aware

## 2019-12-24 NOTE — Progress Notes (Signed)
Notified bedside nurse of need to draw repeat lactic acid per sepsis protocol. 

## 2019-12-24 NOTE — ED Triage Notes (Signed)
Pt BIBA from home.   Per EMS- Family reports pt AMS starting this AM.  Pt at baseline AOx4.  GCS x12, AOx0 on arrival to ED. Hx -Cellulitis to BLLE, vaginal abcess.   VS  128/55 86% RA --> 95% 4L O2 Pt taking plavix, in afib.  GBG 153 102.0 temporal  EtCO2 30 20 gL AC--> 100 mL NaCl

## 2019-12-25 ENCOUNTER — Other Ambulatory Visit: Payer: Self-pay

## 2019-12-25 LAB — COMPREHENSIVE METABOLIC PANEL
ALT: 27 U/L (ref 0–44)
AST: 46 U/L — ABNORMAL HIGH (ref 15–41)
Albumin: 2.4 g/dL — ABNORMAL LOW (ref 3.5–5.0)
Alkaline Phosphatase: 67 U/L (ref 38–126)
Anion gap: 10 (ref 5–15)
BUN: 27 mg/dL — ABNORMAL HIGH (ref 8–23)
CO2: 17 mmol/L — ABNORMAL LOW (ref 22–32)
Calcium: 8.2 mg/dL — ABNORMAL LOW (ref 8.9–10.3)
Chloride: 110 mmol/L (ref 98–111)
Creatinine, Ser: 1.62 mg/dL — ABNORMAL HIGH (ref 0.44–1.00)
GFR calc Af Amer: 33 mL/min — ABNORMAL LOW (ref >60)
GFR calc non Af Amer: 29 mL/min — ABNORMAL LOW (ref >60)
Glucose, Bld: 104 mg/dL — ABNORMAL HIGH (ref 70–99)
Potassium: 4 mmol/L (ref 3.5–5.1)
Sodium: 137 mmol/L (ref 135–145)
Total Bilirubin: 0.9 mg/dL (ref 0.3–1.2)
Total Protein: 6.9 g/dL (ref 6.5–8.1)

## 2019-12-25 LAB — PROCALCITONIN: Procalcitonin: 60.44 ng/mL

## 2019-12-25 LAB — URINE CULTURE: Culture: NO GROWTH

## 2019-12-25 LAB — PROTIME-INR
INR: 1.3 — ABNORMAL HIGH (ref 0.8–1.2)
Prothrombin Time: 15.8 seconds — ABNORMAL HIGH (ref 11.4–15.2)

## 2019-12-25 LAB — LACTIC ACID, PLASMA
Lactic Acid, Venous: 3 mmol/L (ref 0.5–1.9)
Lactic Acid, Venous: 2.7 mmol/L (ref 0.5–1.9)

## 2019-12-25 LAB — CBC
HCT: 35.7 % — ABNORMAL LOW (ref 36.0–46.0)
Hemoglobin: 10.2 g/dL — ABNORMAL LOW (ref 12.0–15.0)
MCH: 29.4 pg (ref 26.0–34.0)
MCHC: 28.6 g/dL — ABNORMAL LOW (ref 30.0–36.0)
MCV: 102.9 fL — ABNORMAL HIGH (ref 80.0–100.0)
Platelets: 317 10*3/uL (ref 150–400)
RBC: 3.47 MIL/uL — ABNORMAL LOW (ref 3.87–5.11)
RDW: 14.5 % (ref 11.5–15.5)
WBC: 15.7 10*3/uL — ABNORMAL HIGH (ref 4.0–10.5)
nRBC: 0 % (ref 0.0–0.2)

## 2019-12-25 LAB — MRSA PCR SCREENING: MRSA by PCR: NEGATIVE

## 2019-12-25 LAB — CORTISOL-AM, BLOOD: Cortisol - AM: 25.4 ug/dL — ABNORMAL HIGH (ref 6.7–22.6)

## 2019-12-25 MED ORDER — HEPARIN SODIUM (PORCINE) 5000 UNIT/ML IJ SOLN
5000.0000 [IU] | Freq: Three times a day (TID) | INTRAMUSCULAR | Status: AC
  Administered 2019-12-25 – 2020-01-02 (×26): 5000 [IU] via SUBCUTANEOUS
  Filled 2019-12-25 (×26): qty 1

## 2019-12-25 MED ORDER — SODIUM CHLORIDE 0.9 % IV SOLN: 2.0000 g | Freq: Once | INTRAVENOUS | Status: DC

## 2019-12-25 MED ORDER — POLYETHYLENE GLYCOL 3350 17 G PO PACK
17.0000 g | PACK | Freq: Two times a day (BID) | ORAL | Status: DC
  Administered 2019-12-25 – 2020-01-04 (×14): 17 g via ORAL
  Filled 2019-12-25 (×16): qty 1

## 2019-12-25 MED ORDER — ADULT MULTIVITAMIN W/MINERALS CH
1.0000 | ORAL_TABLET | Freq: Every day | ORAL | Status: DC
  Administered 2019-12-25 – 2020-01-04 (×11): 1 via ORAL
  Filled 2019-12-25 (×11): qty 1

## 2019-12-25 MED ORDER — CLOPIDOGREL BISULFATE 75 MG PO TABS
75.0000 mg | ORAL_TABLET | Freq: Every day | ORAL | Status: DC
  Administered 2019-12-25 – 2020-01-04 (×11): 75 mg via ORAL
  Filled 2019-12-25 (×11): qty 1

## 2019-12-25 MED ORDER — SENNOSIDES-DOCUSATE SODIUM 8.6-50 MG PO TABS
2.0000 | ORAL_TABLET | Freq: Once | ORAL | Status: AC
  Administered 2019-12-25: 2 via ORAL
  Filled 2019-12-25: qty 2

## 2019-12-25 MED ORDER — CYANOCOBALAMIN 1000 MCG/ML IJ SOLN
1000.0000 ug | INTRAMUSCULAR | Status: DC
  Administered 2019-12-26: 1000 ug via INTRAMUSCULAR
  Filled 2019-12-25 (×2): qty 1

## 2019-12-25 MED ORDER — ACETAMINOPHEN 325 MG PO TABS
650.0000 mg | ORAL_TABLET | Freq: Four times a day (QID) | ORAL | Status: DC | PRN
  Administered 2019-12-29: 650 mg via ORAL
  Filled 2019-12-25 (×2): qty 2

## 2019-12-25 MED ORDER — SODIUM CHLORIDE 0.9 % IV SOLN: INTRAVENOUS | Status: DC

## 2019-12-25 MED ORDER — ISOSORBIDE MONONITRATE ER 60 MG PO TB24
120.0000 mg | ORAL_TABLET | Freq: Every day | ORAL | Status: DC
  Administered 2019-12-25 – 2020-01-04 (×11): 120 mg via ORAL
  Filled 2019-12-25 (×11): qty 2

## 2019-12-25 MED ORDER — HYDROCERIN EX CREA
TOPICAL_CREAM | Freq: Every day | CUTANEOUS | Status: DC
  Administered 2019-12-28: 1 via TOPICAL
  Filled 2019-12-25: qty 113

## 2019-12-25 MED ORDER — VANCOMYCIN HCL IN DEXTROSE 1-5 GM/200ML-% IV SOLN: 1000.0000 mg | Freq: Once | INTRAVENOUS | Status: DC

## 2019-12-25 MED ORDER — SENNOSIDES-DOCUSATE SODIUM 8.6-50 MG PO TABS
2.0000 | ORAL_TABLET | Freq: Every day | ORAL | Status: DC
  Administered 2019-12-25 – 2020-01-02 (×7): 2 via ORAL
  Filled 2019-12-25 (×8): qty 2

## 2019-12-25 MED ORDER — ACETAMINOPHEN 500 MG PO TABS
500.0000 mg | ORAL_TABLET | Freq: Four times a day (QID) | ORAL | Status: DC | PRN
  Administered 2019-12-25: 500 mg via ORAL
  Filled 2019-12-25: qty 1

## 2019-12-25 MED ORDER — CALCIUM CARBONATE-VITAMIN D 500-200 MG-UNIT PO TABS
ORAL_TABLET | Freq: Every day | ORAL | Status: DC
  Administered 2019-12-25 – 2020-01-04 (×11): 1 via ORAL
  Filled 2019-12-25 (×11): qty 1

## 2019-12-25 MED ORDER — AMLODIPINE BESYLATE 10 MG PO TABS
10.0000 mg | ORAL_TABLET | Freq: Every day | ORAL | Status: DC
  Administered 2019-12-25 – 2020-01-03 (×10): 10 mg via ORAL
  Filled 2019-12-25 (×10): qty 1

## 2019-12-25 MED ORDER — MILK AND MOLASSES ENEMA
1.0000 | Freq: Four times a day (QID) | RECTAL | Status: AC
  Filled 2019-12-25 (×2): qty 240

## 2019-12-25 MED ORDER — METRONIDAZOLE IN NACL 5-0.79 MG/ML-% IV SOLN
500.0000 mg | Freq: Three times a day (TID) | INTRAVENOUS | Status: DC
  Administered 2019-12-25 – 2019-12-27 (×8): 500 mg via INTRAVENOUS
  Filled 2019-12-25 (×8): qty 100

## 2019-12-25 MED ORDER — POLYETHYLENE GLYCOL 3350 17 G PO PACK: 17.0000 g | PACK | Freq: Every day | ORAL | Status: DC | PRN

## 2019-12-25 NOTE — Progress Notes (Signed)
PROGRESS NOTE  Connie Miranda [id number] DOB: 03-06-1933 DOA: 12/24/2019 PCP: [name], MD   LOS: 1 day   Brief Narrative / Interim history: 85 year old female with prior CVA, HTN, chronic anemia, G6PD deficiency, recent vulval abscess, dysphagia who was brought in from home where she has been bedbound for almost past 5 years, with worsening confusion and suspected hallucinations.  She was diagnosed with cellulitis of the vulva and has been on Keflex for the last week.  She was found to be septic in the ER with suspected vulvar abscess and left lower extremity cellulitis  Subjective / 24h Interval events: She seems to be alert this morning, minimal to no confusion noted.  No chest pain, no abdominal pain, no nausea or vomiting.  Assessment & Plan: Principal Problem Sepsis with concern for cellulitis as well as endometritis -OB/GYN consulted, appreciate input.  Exam difficult due to significant constipation with fecal impaction -For now continue broad-spectrum antibiotic with vancomycin, cefepime, Flagyl -Sepsis physiology improving however lactic acid still elevated, monitor cultures, continue fluids  Active Problems Acute kidney injury -Likely in the setting of sepsis, creatinine improving with fluids, 1.7 on admission 1.6 this morning.  Continue to monitor, avoid further nephrotoxic agents -Continue fluids for 5 additional hours today  Hypertension -Continue home amlodipine  Left lower extremity cellulitis -Antibiotics as per #1  Chronic diastolic CHF -Continue home regimen, stable  Prior CVA -Continue home Plavix.  She does not appear to be on a statin  Constipation/fecal impaction -Attempted an enema today however patient declined as she already has had a large bowel movement -Aggressive bowel regimen, with a large bowel movement this morning I suspect she is no longer impacted   Scheduled Meds: . amLODipine  10 mg Oral Daily  . calcium-vitamin D   Oral  Daily  . clopidogrel  75 mg Oral Daily  . cyanocobalamin  1,000 mcg Intramuscular Q30 days  . heparin  5,000 Units Subcutaneous Q8H  . hydrocerin   Topical Daily  . isosorbide mononitrate  120 mg Oral Daily  . milk and molasses  1 enema Rectal Q6H  . multivitamin with minerals  1 tablet Oral Daily  . polyethylene glycol  17 g Oral BID   Continuous Infusions: . sodium chloride    . ceFEPime (MAXIPIME) IV 2 g (12/25/19 1001)  . metronidazole 500 mg (12/25/19 0755)  . vancomycin Stopped (12/25/19 0124)   PRN Meds:.acetaminophen, acetaminophen, polyethylene glycol  DVT prophylaxis: Heparin Code Status: Full code Family Communication: Discussed with patient Disposition Plan: Home when ready  Consultants:  OB/GYN PCCM  Procedures:  None   Microbiology  Urine culture 12/26-no growth, final Blood cultures 12/26-no growth, pending SARS-CoV-2 12/26-negative  Antimicrobials: Vancomycin 12/26 Cefepime 12/26 Metronidazole 12/26   Objective: Vitals:   12/25/19 0000 12/25/19 0100 12/25/19 0300 12/25/19 0509  BP: 126/62 138/65 137/63 132/84  Pulse: 88 88 86 83  Resp: (!) 22 19 (!) 23 (!) 24  Temp:   99 F (37.2 C) 98.5 F (36.9 C)  TempSrc:   Oral Oral  SpO2: 100% 100% 95% 99%  Weight:    110 kg    Intake/Output Summary (Last 24 hours) at 12/25/2019 1446 Last data filed at 12/25/2019 1153 Gross per 24 hour  Intake 2340 ml  Output 4 ml  Net 2336 ml   Filed Weights   12/24/19 1120 12/25/19 0509  Weight: 124.7 kg 110 kg    Examination:  Constitutional: NAD, eating breakfast  Eyes: no scleral icterus ENMT: Mucous  membranes are moist.  Neck: normal, supple Respiratory: clear to auscultation bilaterally, no wheezing, no crackles. Normal respiratory effort.  Cardiovascular: Regular rate and rhythm, no murmurs / rubs / gallops. No LE edema.  Abdomen: non distended, no tenderness. Skin: no rashes Neurologic: Residual left-sided weakness from prior CVA, no new focal  deficits Psychiatric: Normal judgment and insight. Alert and oriented x 3. Normal mood.    Data Reviewed: I have independently reviewed following labs and imaging studies   CBC: Recent Labs  Lab 12/24/19 1030 12/25/19 0617  WBC 17.8* 15.7*  NEUTROABS 16.4*  --   HGB 11.6* 10.2*  HCT 38.2 35.7*  MCV 96.7 102.9*  PLT 374 [id number]   Basic Metabolic Panel: Recent Labs  Lab 12/24/19 1030 12/25/19 0617  NA 137 137  K 4.1 4.0  CL 104 110  CO2 20* 17*  GLUCOSE 138* 104*  BUN 19 27*  CREATININE 1.75* 1.62*  CALCIUM 8.8* 8.2*   Liver Function Tests: Recent Labs  Lab 12/24/19 1030 12/25/19 0617  AST 78* 46*  ALT 30 27  ALKPHOS 96 67  BILITOT 1.4* 0.9  PROT 7.9 6.9  ALBUMIN 2.8* 2.4*   Coagulation Profile: Recent Labs  Lab 12/24/19 1030 12/25/19 0617  INR 1.3* 1.3*   HbA1C: No results for input(s): HGBA1C in the last 72 hours. CBG: No results for input(s): GLUCAP in the last 168 hours.  Recent Results (from the past 240 hour(s))  Blood culture (routine x 2)     Status: None   Collection Time: 12/15/19  6:07 PM   Specimen: BLOOD  Result Value Ref Range Status   Specimen Description   Final    BLOOD Performed at [facility] [address] [place], [place] [address]    Special Requests   Final    BOTTLES DRAWN AEROBIC AND ANAEROBIC Blood Culture results may not be optimal due to an excessive volume of blood received in culture bottles Performed at [facility] [address], [place], [place] [address]    Culture   Final    NO GROWTH 5 DAYS Performed at [facility], [place] [address] [place], [place] [address]    Report Status 12/20/2019 FINAL  Final  Blood culture (routine x 2)     Status: None   Collection Time: 12/15/19  6:12 PM   Specimen: BLOOD  Result Value Ref Range Status   Specimen Description   Final    BLOOD Performed at [facility] [address] [place], [place] [address]     Special Requests   Final    BOTTLES DRAWN AEROBIC AND ANAEROBIC Blood Culture adequate volume Performed at [facility] [address], [place], [place] [address]    Culture   Final    NO GROWTH 5 DAYS Performed at [facility], [place] [address], [place], [place] [address]    Report Status 12/20/2019 FINAL  Final  Urine culture     Status: None   Collection Time: 12/15/19  8:47 PM   Specimen: Urine, Random  Result Value Ref Range Status   Specimen Description   Final    URINE, RANDOM Performed at [facility] [address] [place], [place] [address]    Special Requests   Final    NONE Performed at [facility], [place] [address] [place], [place] [address]    Culture   Final    NO GROWTH Performed  at [facility], [place] [address] [place], [place] [address]    Report Status 12/16/2019 FINAL  Final  Culture, blood (Routine x 2)     Status: None (Preliminary result)   Collection Time: 12/24/19 10:30 AM   Specimen: BLOOD  Result Value Ref Range Status   Specimen Description   Final    BLOOD RIGHT ANTECUBITAL Performed at [facility], [place] [address] [place], [place] [address]    Special Requests   Final    BOTTLES DRAWN AEROBIC AND ANAEROBIC Blood Culture adequate volume Performed at [facility] [address], [place], [place] [address]    Culture   Final    NO GROWTH < 24 HOURS Performed at [facility] [address], [place], [place] [address]    Report Status PENDING  Incomplete  Culture, blood (Routine x 2)     Status: None (Preliminary result)   Collection Time: 12/24/19 10:35 AM   Specimen: BLOOD LEFT HAND  Result Value Ref Range Status   Specimen Description   Final    BLOOD LEFT HAND Performed at [facility], [place] [address], [place], [place] [address]    Special Requests   Final    BOTTLES DRAWN AEROBIC AND ANAEROBIC Blood Culture results may not be  optimal due to an inadequate volume of blood received in culture bottles Performed at [facility] [address], [place], [place] [address]    Culture   Final    NO GROWTH < 24 HOURS Performed at [facility] [address], [place], [place] [address]    Report Status PENDING  Incomplete  Urine culture     Status: None   Collection Time: 12/24/19 10:39 AM   Specimen: In/Out Cath Urine  Result Value Ref Range Status   Specimen Description   Final    IN/OUT CATH URINE Performed at [facility] [address], [place], [place] [address]    Special Requests   Final    NONE Performed at [facility], [place] [address], [place], [place] [address]    Culture   Final    NO GROWTH Performed at [facility], [place] [address] [place],  [address]    Report Status 12/25/2019 FINAL  Final  SARS CORONAVIRUS 2 (TAT 6-24 HRS) Nasopharyngeal Nasopharyngeal Swab     Status: None   Collection Time: 12/24/19  1:32 PM   Specimen: Nasopharyngeal Swab  Result Value Ref Range Status   SARS Coronavirus 2 NEGATIVE NEGATIVE Final    Comment: (NOTE) SARS-CoV-2 target nucleic acids are NOT DETECTED. The SARS-CoV-2 RNA is generally detectable in upper and lower respiratory specimens during the acute phase of infection. Negative results do not preclude SARS-CoV-2 infection, do not rule out co-infections with other pathogens, and should not be used as the sole basis for treatment or other patient management decisions. Negative results must be combined with clinical observations, patient history, and epidemiological information. The expected result is Negative. Fact Sheet for Patients: SugarRoll.be Fact Sheet for Healthcare Providers: https://www.woods-mathews.com/ This test is not yet approved or cleared by the [place] FDA and  has been authorized for detection and/or diagnosis of  SARS-CoV-2 by FDA under an Emergency Use Authorization (EUA). This EUA will remain  in effect (meaning this test can be used) for the duration of the COVID-19 declaration under Section 56 4(b)(1) of the Act, 21 U.S.C. section 360bbb-3(b)(1),  unless the authorization is terminated or revoked sooner. Performed at [facility], [place] [address] [place], [place] [address]      Radiology Studies: No results found.  Marzetta Board, MD, PhD [facility]  Between 7 am - 7 pm I am available, please contact me via Amion or Securechat  Between 7 pm - 7 am I am not available, please contact night coverage MD/APP via Amion

## 2019-12-25 NOTE — Progress Notes (Signed)
Pt was cleaned / bathed, coated with [id number] moisturizing body cream as her feet & lower legs had thick dry, flaking skin, Dead skin in armpits washed off, labia painful with enlarged skin tags noted, multiple areas of blackheads ( chest, back & lips), MASD on rt ant neck/ chest, groins, hands very dry / ruff, long dirty fingernails.

## 2019-12-25 NOTE — Progress Notes (Signed)
FACULTY PRACTICE PROGRESS NOTE  Connie Miranda [id number] DOB: Oct 21, 1933 DOA: 12/24/2019 PCP: [name], MD  Assessment/Plan: Sepsis with encephalopathy, unknown source  Blood cultures pending  On Vanco, cefepime, Flagyl  I have added a MRSA PCR screen.  If returns negative, should be able to discontinue the vancomycin.  At this point, there is no vulvar abscess.  Question remains of whether she has a endometritis   Lactic acid trending down  Acute respiratory failure with hypoxia  Appears improved.  Cellulitis  There does not appear to be any vulvar cellulitis  AKI  Creatinine on admission 1.75  Creatinine now slightly improved at 1.62  Thickened endometrium with postmenopausal bleed  Continues to have some bloody discharge  No foul order  Still has fecal impaction. She will need to be disimpacted prior to biopsy  Hypertension  BP improved at [id number]   Diastolic Dysfunction CHF  Procedures:    Antibiotics: Anti-infectives (From admission, onward)   Start     Dose/Rate Route Frequency Ordered Stop   12/25/19 0600  metroNIDAZOLE (FLAGYL) IVPB 500 mg     500 mg 100 mL/hr over 60 Minutes Intravenous Every 8 hours 12/25/19 0450     12/25/19 0500  ceFEPIme (MAXIPIME) 2 g in sodium chloride 0.9 % 100 mL IVPB  Status:  Discontinued     2 g 200 mL/hr over 30 Minutes Intravenous  Once 12/25/19 0450 12/25/19 0454   12/25/19 0500  vancomycin (VANCOCIN) IVPB 1000 mg/200 mL premix  Status:  Discontinued     1,000 mg 200 mL/hr over 60 Minutes Intravenous  Once 12/25/19 0450 12/25/19 0453   12/24/19 2200  ceFEPIme (MAXIPIME) 2 g in sodium chloride 0.9 % 100 mL IVPB     2 g 200 mL/hr over 30 Minutes Intravenous Every 12 hours 12/24/19 1127     12/24/19 2200  vancomycin (VANCOCIN) IVPB 1000 mg/200 mL premix     1,000 mg 200 mL/hr over 60 Minutes Intravenous Every 36 hours 12/24/19 1943     12/24/19 1045  ceFEPIme (MAXIPIME) 2 g in sodium chloride 0.9 %  100 mL IVPB     2 g 200 mL/hr over 30 Minutes Intravenous  Once 12/24/19 1044 12/24/19 1137   12/24/19 1045  metroNIDAZOLE (FLAGYL) IVPB 500 mg     500 mg 100 mL/hr over 60 Minutes Intravenous  Once 12/24/19 1044 12/24/19 1453   12/24/19 1045  vancomycin (VANCOCIN) IVPB 1000 mg/200 mL premix     1,000 mg 200 mL/hr over 60 Minutes Intravenous  Once 12/24/19 1044 12/24/19 1209       HPI/Subjective: Patient complaining of wheezing.  Denies abdominal/pelvic pain.  Feels better than yesterday.  Objective: Vitals:   12/25/19 0300 12/25/19 0509  BP: 137/63 132/84  Pulse: 86 83  Resp: (!) 23 (!) 24  Temp: 99 F (37.2 C) 98.5 F (36.9 C)  SpO2: 95% 99%    Intake/Output Summary (Last 24 hours) at 12/25/2019 1129 Last data filed at 12/25/2019 1047 Gross per 24 hour  Intake 4640 ml  Output 3 ml  Net 4637 ml   Filed Weights   12/24/19 1120 12/25/19 0509  Weight: 124.7 kg 110 kg    Exam:   General: Alert and oriented x3.  No acute distress  Cardiovascular: Regular rate  Respiratory: Audible expiratory wheezing  Abdomen: Soft, nontender  Vaginal: Patient has elongated and pendulous labia minora, which are soft, nontender, not erythematous.  There is no evidence of labial cellulitis.  Patient does have  a dark bloody discharge that is not malodorous.  Again, the patient has significant fecal impaction which is distending the posterior wall of the vagina and making access to the cervix impossible.  She does not complain of pain  Extremity: Significant venous stasis changes to the lower extremities bilaterally much of the scaling skin has been removed.   Data Reviewed: Basic Metabolic Panel: Recent Labs  Lab 12/24/19 1030 12/25/19 0617  NA 137 137  K 4.1 4.0  CL 104 110  CO2 20* 17*  GLUCOSE 138* 104*  BUN 19 27*  CREATININE 1.75* 1.62*  CALCIUM 8.8* 8.2*   Liver Function Tests: Recent Labs  Lab 12/24/19 1030 12/25/19 0617  AST 78* 46*  ALT 30 27  ALKPHOS 96  67  BILITOT 1.4* 0.9  PROT 7.9 6.9  ALBUMIN 2.8* 2.4*   No results for input(s): LIPASE, AMYLASE in the last 168 hours. No results for input(s): AMMONIA in the last 168 hours. CBC: Recent Labs  Lab 12/24/19 1030 12/25/19 0617  WBC 17.8* 15.7*  NEUTROABS 16.4*  --   HGB 11.6* 10.2*  HCT 38.2 35.7*  MCV 96.7 102.9*  PLT 374 317   Cardiac Enzymes: No results for input(s): CKTOTAL, CKMB, CKMBINDEX, TROPONINI in the last 168 hours. BNP (last 3 results) No results for input(s): BNP in the last 8760 hours.  ProBNP (last 3 results) No results for input(s): PROBNP in the last 8760 hours.  CBG: No results for input(s): GLUCAP in the last 168 hours.  Recent Results (from the past 240 hour(s))  Blood culture (routine x 2)     Status: None   Collection Time: 12/15/19  6:07 PM   Specimen: BLOOD  Result Value Ref Range Status   Specimen Description   Final    BLOOD Performed at [facility] [address] [place], [place] [address]    Special Requests   Final    BOTTLES DRAWN AEROBIC AND ANAEROBIC Blood Culture results may not be optimal due to an excessive volume of blood received in culture bottles Performed at [facility] [address], [place], [place] [address]    Culture   Final    NO GROWTH 5 DAYS Performed at [facility], [place] [address] [place], [place] [address]    Report Status 12/20/2019 FINAL  Final  Blood culture (routine x 2)     Status: None   Collection Time: 12/15/19  6:12 PM   Specimen: BLOOD  Result Value Ref Range Status   Specimen Description   Final    BLOOD Performed at [facility] [address] [place], [place] [address]    Special Requests   Final    BOTTLES DRAWN AEROBIC AND ANAEROBIC Blood Culture adequate volume Performed at [facility] [address], [place], [place] [address]    Culture   Final    NO GROWTH 5 DAYS Performed at [facility], [place] [address] [place], [place] [address]    Report Status 12/20/2019 FINAL  Final  Urine culture     Status: None   Collection Time: 12/15/19  8:47 PM   Specimen: Urine, Random  Result Value Ref Range Status   Specimen Description   Final    URINE, RANDOM Performed at [facility] [address], [place],  [address]    Special Requests   Final    NONE Performed at [facility], [address] [place], [place] [address]    Culture   Final    NO GROWTH Performed at [facility], [place] [address], [place], [place] [address]    Report Status 12/16/2019 FINAL  Final  Culture, blood (Routine x 2)     Status: None (Preliminary result)   Collection Time: 12/24/19 10:30 AM   Specimen: BLOOD  Result Value Ref Range Status   Specimen Description   Final    BLOOD RIGHT ANTECUBITAL Performed at [facility], [place] [address], [place], [place] [address]    Special Requests   Final    BOTTLES DRAWN AEROBIC AND ANAEROBIC Blood Culture adequate volume Performed at [facility] [address], [place], [place] [address]    Culture   Final    NO GROWTH < 24 HOURS Performed at [facility] [address] [place], [place] [address]    Report Status PENDING  Incomplete  Culture, blood (Routine x 2)     Status: None (Preliminary result)   Collection Time: 12/24/19 10:35 AM   Specimen: BLOOD LEFT HAND  Result Value Ref Range Status   Specimen Description   Final    BLOOD LEFT HAND Performed at [facility], [place] [address] [place], [place] [address]    Special Requests   Final    BOTTLES DRAWN AEROBIC AND ANAEROBIC Blood Culture results may not be optimal due to an inadequate volume of blood received in culture bottles Performed at [facility] [address] [place], [place] [address]    Culture   Final    NO GROWTH < 24 HOURS Performed at [facility] [address] [place], [place] [address]    Report Status PENDING  Incomplete  SARS CORONAVIRUS 2 (TAT 6-24 HRS) Nasopharyngeal Nasopharyngeal Swab     Status: None   Collection Time: 12/24/19  1:32 PM   Specimen: Nasopharyngeal Swab  Result Value Ref Range Status   SARS Coronavirus 2 NEGATIVE NEGATIVE Final    Comment: (NOTE) SARS-CoV-2 target nucleic acids are NOT DETECTED. The SARS-CoV-2 RNA is generally detectable in upper and lower respiratory specimens during the acute phase of infection. Negative results do not preclude SARS-CoV-2 infection, do not rule out co-infections with other pathogens, and should not be used as the sole basis for treatment or other patient management decisions. Negative results must be combined with clinical observations, patient history, and epidemiological information. The expected result is Negative. Fact Sheet for Patients: SugarRoll.be Fact Sheet for Healthcare Providers: https://www.woods-mathews.com/ This test is not yet approved or cleared by the [place] FDA and  has been authorized for detection and/or diagnosis of SARS-CoV-2 by FDA under an Emergency Use Authorization (EUA). This EUA will remain  in effect (meaning this test can be used) for the duration of the COVID-19 declaration under Section 56 4(b)(1) of the Act, 21 U.S.C. section 360bbb-3(b)(1), unless the authorization is terminated or revoked sooner. Performed at [facility], [place] [address], [place], [place] [address]      Studies: CT ABDOMEN PELVIS WO CONTRAST  Result Date: 12/24/2019 CLINICAL DATA:  History of vaginal abscess. EXAM: CT ABDOMEN AND PELVIS WITHOUT CONTRAST TECHNIQUE: Multidetector CT imaging of the abdomen and pelvis was performed following the standard protocol without IV contrast. COMPARISON:  12/15/2019 FINDINGS: Lower chest: Unremarkable. Hepatobiliary: No focal abnormality in the liver on this study without intravenous  contrast. Gallbladder is collapsed. Gallstones demonstrated by  cholangiogram on 06/11/2017 are not evident on this CT. No intrahepatic or extrahepatic biliary dilation. Pancreas: No focal mass lesion. No dilatation of the main duct. No intraparenchymal cyst. No peripancreatic edema. Spleen: No splenomegaly. No focal mass lesion. Adrenals/Urinary Tract: No adrenal nodule or mass. Kidneys unremarkable. No evidence for hydroureter. Bladder decompressed. Stomach/Bowel: Stomach is unremarkable. No gastric wall thickening. No evidence of outlet obstruction. Duodenum is normally positioned as is the ligament of Treitz. No small bowel wall thickening. No small bowel dilatation. The terminal ileum is normal. The appendix is normal. Diverticular changes are noted in the left colon without evidence of diverticulitis. Marked stool volume noted in the rectum with rectal diameter measuring 12.8 x 9.3 cm, similar to prior. No substantial perirectal edema or inflammation at this time. Vascular/Lymphatic: There is abdominal aortic atherosclerosis without aneurysm. There is no gastrohepatic or hepatoduodenal ligament lymphadenopathy. No retroperitoneal or mesenteric lymphadenopathy. No pelvic sidewall lymphadenopathy. Reproductive: IUD visualized in the uterus. Uterus appears thickened and prior study with contrast material demonstrated marked prominence of the endometrium. There is no adnexal mass. The right-sided vaginal fluid collection measured on the previous study appears decreased. There also appears to be a relatively stable fluid collection involving the left labia majora. This is a focal abnormality and there is no edema or inflammation in the soft tissues of the perineum. There is no gas in the soft tissues of the perineum. Other: No intraperitoneal free fluid. Musculoskeletal: Bones are diffusely demineralized. No worrisome lytic or sclerotic osseous abnormality. IMPRESSION: 1. The previously measured right vaginal fluid  collection at 2.8 x 1 point 0 cm is not clearly demonstrated on today's study. There does appear to be a fluid collection in the left labia majora measuring 4.3 x 1.7 cm, similar to prior study. No features on today's study to suggest necrotizing fasciitis. 2. Marked apparent thickening of the endometrium. Neoplasm not excluded. Pelvic ultrasound recommended to further evaluate. 3. Collapsed gallbladder. Gallstones documented on previous cholecystostomy tube injection are not visualized on today's CT. 4.  Aortic Atherosclerois (ICD10-170.0) Electronically Signed   By: Misty Stanley M.D.   On: 12/24/2019 12:44   DG Chest 2 View  Result Date: 12/24/2019 CLINICAL DATA:  Altered mental status, shortness of breath. EXAM: CHEST - 2 VIEW COMPARISON:  None. FINDINGS: The heart size and mediastinal contours are within normal limits. Both lungs are clear. The visualized skeletal structures are stable. IMPRESSION: No active cardiopulmonary disease. Electronically Signed   By: Abelardo Diesel M.D.   On: 12/24/2019 12:02    Scheduled Meds: . amLODipine  10 mg Oral Daily  . calcium-vitamin D   Oral Daily  . clopidogrel  75 mg Oral Daily  . cyanocobalamin  1,000 mcg Intramuscular Q30 days  . heparin  5,000 Units Subcutaneous Q8H  . hydrocerin   Topical Daily  . isosorbide mononitrate  120 mg Oral Daily  . milk and molasses  1 enema Rectal Q6H  . multivitamin with minerals  1 tablet Oral Daily  . polyethylene glycol  17 g Oral BID   Continuous Infusions: . sodium chloride    . ceFEPime (MAXIPIME) IV 2 g (12/25/19 1001)  . metronidazole 500 mg (12/25/19 0755)  . vancomycin Stopped (12/25/19 0124)    Principal Problem:   Sepsis ([place]) Active Problems:   Hypertension   Cellulitis of left lower leg   AKI (acute kidney injury) ([place])   Chronic diastolic CHF (congestive heart failure) ([place])   Acute respiratory failure with hypoxia ([place])  Time spent: [place]  Attending Physician 12/25/2019, 11:29 AM  LOS: 1 day

## 2019-12-25 NOTE — Consult Note (Addendum)
WOC Nurse Consult Note: Consult requested for moisture associated skin damage and dry flaking skin.  Performed remotely after review of the progress notes.  Pt has sepsis of an unknown source and is noted to have swollen protruding areas in vulva area.  Refer to GYN notes from last week: The Dec 17 CT raised the question of a small labial abscess, and my exam today shows that she has 2 small pendulous tags of the labia minora, but these are not infected.  She does have a mucoid blood non-odorous vaginal d/c which is suspect is originating from the endometrium, representing ea d/c from an endometrial process that could be endometrial cancer, or a pyometra. If a pyometra, it could be a source of septicemia. Unfortunately she has a massive fecal impaction that fills the pelvis, with overflow fecal incontinence around it, I cannot even feel the cervix on bimanual due to the hard impaction posteriorly. This will need to be evacuated before Gyn could access the cervix for D&C or endometrial biopsy. This complex medical condition is beyond the scope of practice for [facility] nurses; please re-consult GYN team for further recommendations regarding this location.   Topcial treatment orders provided for bedside nurses to perform daily as follows: Bilat legs are noted to have dry cracked peeling skin. Skin folds and buttocks have moisture associated skin damage Plan: Eucerin cream to assist with removal of loose nonviable skin and provide  moisture to bilat legs. Interdry silver-impregnated fabric to breast/skin folds to provide antimicrobial benefits and wick moisture away from skin. Barrier cream to buttocks to protect and repel incontinence.  Please re-consult if further assistance is needed.  Thank-you,  Julien Girt MSN, [place], [place], [place], [place]

## 2019-12-26 ENCOUNTER — Inpatient Hospital Stay (HOSPITAL_COMMUNITY): Payer: Medicare Other

## 2019-12-26 ENCOUNTER — Encounter (HOSPITAL_COMMUNITY): Payer: Self-pay | Admitting: Internal Medicine

## 2019-12-26 DIAGNOSIS — R9389 Abnormal findings on diagnostic imaging of other specified body structures: Secondary | ICD-10-CM

## 2019-12-26 DIAGNOSIS — A419 Sepsis, unspecified organism: Principal | ICD-10-CM

## 2019-12-26 DIAGNOSIS — R6520 Severe sepsis without septic shock: Secondary | ICD-10-CM

## 2019-12-26 DIAGNOSIS — N950 Postmenopausal bleeding: Secondary | ICD-10-CM

## 2019-12-26 LAB — COMPREHENSIVE METABOLIC PANEL
ALT: 26 U/L (ref 0–44)
AST: 43 U/L — ABNORMAL HIGH (ref 15–41)
Albumin: 2.2 g/dL — ABNORMAL LOW (ref 3.5–5.0)
Alkaline Phosphatase: 65 U/L (ref 38–126)
Anion gap: 7 (ref 5–15)
BUN: 30 mg/dL — ABNORMAL HIGH (ref 8–23)
CO2: 21 mmol/L — ABNORMAL LOW (ref 22–32)
Calcium: 8.2 mg/dL — ABNORMAL LOW (ref 8.9–10.3)
Chloride: 112 mmol/L — ABNORMAL HIGH (ref 98–111)
Creatinine, Ser: 1.3 mg/dL — ABNORMAL HIGH (ref 0.44–1.00)
GFR calc Af Amer: 44 mL/min — ABNORMAL LOW (ref >60)
GFR calc non Af Amer: 38 mL/min — ABNORMAL LOW (ref >60)
Glucose, Bld: 99 mg/dL (ref 70–99)
Potassium: 3.7 mmol/L (ref 3.5–5.1)
Sodium: 140 mmol/L (ref 135–145)
Total Bilirubin: 1.1 mg/dL (ref 0.3–1.2)
Total Protein: 6.3 g/dL — ABNORMAL LOW (ref 6.5–8.1)

## 2019-12-26 LAB — CBC
HCT: 32.6 % — ABNORMAL LOW (ref 36.0–46.0)
Hemoglobin: 9.8 g/dL — ABNORMAL LOW (ref 12.0–15.0)
MCH: 30 pg (ref 26.0–34.0)
MCHC: 30.1 g/dL (ref 30.0–36.0)
MCV: 99.7 fL (ref 80.0–100.0)
Platelets: 267 10*3/uL (ref 150–400)
RBC: 3.27 MIL/uL — ABNORMAL LOW (ref 3.87–5.11)
RDW: 14.5 % (ref 11.5–15.5)
WBC: 11.9 10*3/uL — ABNORMAL HIGH (ref 4.0–10.5)
nRBC: 0 % (ref 0.0–0.2)

## 2019-12-26 NOTE — Progress Notes (Signed)
Patient's daughter called and stated that her and patient's family talked and would like to go forward w/ the surgical procedure. They feel like they need to try everything they can to take care of the patient properly.

## 2019-12-26 NOTE — Progress Notes (Signed)
Gynecology Progress Note  Admission Date: 12/24/2019 Current Date: 12/26/2019 2:42 PM  Connie Miranda is a 85 y.o. HD#3 admitted for sepsis with unknown source, cellulitis vs endometritis, AKI, h/o HTN, chronic CHF, h/o CVA, severe fecal impaction.  History complicated by: Patient Active Problem List   Diagnosis Date Noted  . Shock circulatory ([place]) 12/24/2019  . Chronic diastolic CHF (congestive heart failure) ([place]) 06/10/2018  . Sepsis ([place]) 06/10/2018  . Acute respiratory failure with hypoxia ([place]) 06/10/2018  . Hypokalemia 06/10/2018  . Acute bronchitis 06/10/2018  . Lower extremity weakness 02/11/2016  . Difficulty walking 04/08/2013  . Weakness of both legs 04/08/2013  . Morbid obesity ([place]) 04/08/2013  . AKI (acute kidney injury) ([place]) 01/28/2013  . Hyperkalemia 01/28/2013  . Fall 01/28/2013  . Nausea 01/28/2013  . Lymphedema 10/16/2012  . Cellulitis 10/16/2012  . Diarrhea 10/13/2012  . Cellulitis of left lower leg 10/12/2012  . Anemia 10/12/2012  . Abdominal pain 10/12/2012  . Hip pain 10/12/2012  . Hypertension   . Edema   . Pernicious anemia   . Glucose-6-phosphate dehydrogenase deficiency   . Osteoarthritis   . Stroke ([place]) 09/28/2000    Subjective:  Patient feeling well. States she has had several bowel movements (6 per RN). Denies any complaints currently. Has had small amount of vaginal bleeding.  Obtained some history from daughter, Rosemarie Beath, who states patient had IUD placed after delivery approx 49 years ago. There was an attempt to remove it about 10 years after it was placed but the strings broke and decision made to leave it in place. Has not seen a gynecologic provider since.   Objective:   Vitals:   12/25/19 0509 12/25/19 1500 12/25/19 2020 12/26/19 0540  BP: 132/84 (!) 119/50 104/81 (!) 116/50  Pulse: 83 78 88 75  Resp: (!) 24 20 20 19   Temp: 98.5 F (36.9 C) 97.8 F (36.6 C) 98.3 F (36.8 C) (!) 97.5 F (36.4 C)  TempSrc: Oral  Oral    SpO2: 99% (!) 84% 100% 100%  Weight: 110 kg       Total I/O In: 480 [P.O.:480] Out: -   Intake/Output Summary (Last 24 hours) at 12/26/2019 1442 Last data filed at 12/26/2019 1300 Gross per 24 hour  Intake 1869.25 ml  Output --  Net 1869.25 ml   Physical exam: BP (!) 116/50 (BP Location: Right Arm)   Pulse 75   Temp (!) 97.5 F (36.4 C)   Resp 19   Wt 110 kg   SpO2 100%   BMI 42.28 kg/m  CONSTITUTIONAL: Well-developed, well-nourished female in no acute distress.  HENT:  Normocephalic, atraumatic, External right and left ear normal.  SKIN: Skin is warm and dry. Significant thickening of the skin in lower extremities with sloughing noted NEUROLOGIC: Alert and oriented to person, place, and time.  PSYCHIATRIC: Normal mood and affect. Normal behavior. Normal judgment and thought content. Has trouble with word finding but appears to understand conversation with no issues, asks appropriate questions CARDIOVASCULAR: Normal heart rate noted RESPIRATORY: Effort normal, no problems with respiration noted. ABDOMEN: Soft, no distention noted.   PELVIC: deferred MUSCULOSKELETAL: Normal range of motion. No tenderness.  No cyanosis, clubbing, or edema.    Labs  Recent Labs  Lab 12/24/19 1030 12/25/19 0617 12/26/19 0534  WBC 17.8* 15.7* 11.9*  HGB 11.6* 10.2* 9.8*  HCT 38.2 35.7* 32.6*  PLT 374 317 267    Assessment & Plan:   Patient is 85 y.o. HD#3  admitted for sepsis of unknown origin. Gyn originally consulted for concern for vulvar abscess, per exam of my colleagues, no evidence of abscess on exam. Vaginal exam limited by extreme fecal impaction. Per RN and patient, she has had several bowel movements today and is passing stool now.   I reviewed several issues with patient and her daughter, Rosemarie Beath (via telephone). One is that her sepsis could potentially be due to an endometritis although this is unlikely in a post menopausal woman. A second is the finding of the  thickened endometrium as well as post menopausal bleeding and that this could be due to inflammation secondary to the IUD, benign hyperplasia or potentially a malignancy. Reviewed that sampling of the lining of the uterus would be the only way to obtain a definitive diagnosis/rule out malignancy.   Reviewed that sampling could be done via [facility] hysteroscopy with plans for removal of IUD in OR, via bedside endometrial biopsy (which would be difficult given patient's mobility issues), or that she can choose to decline sampling. Reviewed risks associated with anesthesia/D&C including inability to extubate, reaction to anesthesia, worsening of her chronic medical conditions. Reviewed that bedside endometrial biopsy may not be possible due to patient mobility/positioning issues or discomfort but would potentially provide diagnosis without risks of anesthesia. Reviewed that she may also decline to have anything further done at this point but that with her mobility issues, it would be nearly impossible to see her in the office for exam.  Patient and her mother are undecided about what they would like to pursue at this point. Patient initially stated she did not want to have surgery, but then stated as long as she was "completely asleep", she would consider that option. They would like to think about it and discuss. Patient's daughter, Rosemarie Beath, will let us know, or I will contact her in the morning to discuss ([phone]) further management. Answered all questions. I also reviewed the above with Dr. Cruzita Lederer of the hospitalist team who feels she would be appropriate for a D&C.   As patient is improving at this point with antibiotics, no need for urgent intervention. Pending patient's decision, may try for bedside exam tomorrow as it seems she has had significant progress with fecal impaction.  Sepsis - cont antibiotics per primary team - White count almost normalized  Thickened endometrium/post menopausal  bleeding - transabdominal ultrasound ordered  - will await patient's decision regarding D&C vs EMB vs no further management at this time - will contact patient's daughter in am  AKI - Cr improving - per primary team  HTN and CHF - cont home meds  Prior CVA - cont plavix   Thank you for this consult, we will continue to follow along.  For OB/GYN issues, please call the [facility] Monday - Friday, 8 am - 5 pm: [phone] All other times: [phone]   K. Arvilla Meres, M.D. Attending [facility] for [employer] [facility])

## 2019-12-26 NOTE — Progress Notes (Signed)
PROGRESS NOTE  Connie Miranda [id number] DOB: May 27, 1933 DOA: 12/24/2019 PCP: [name], MD   LOS: 2 days   Brief Narrative / Interim history: 85 year old female with prior CVA, HTN, chronic anemia, G6PD deficiency, recent vulval abscess, dysphagia who was brought in from home where she has been bedbound for almost past 5 years, with worsening confusion and suspected hallucinations.  She was diagnosed with cellulitis of the vulva and has been on Keflex for the last week.  She was found to be septic in the ER with suspected vulvar abscess and left lower extremity cellulitis  Subjective / 24h Interval events: She seems to be alert this morning, minimal to no confusion noted.  No chest pain, no abdominal pain, no nausea or vomiting.  Assessment & Plan: Principal Problem Sepsis with concern for cellulitis as well as endometritis -OB/GYN consulted, appreciate input.  Exam difficult due to significant constipation with fecal impaction -For now continue broad-spectrum antibiotic with cefepime, Flagyl.  Discontinue Vanco as MRSA was negative -Sepsis physiology improved, white count almost normalized at 11.9  Active Problems Acute kidney injury -Likely in the setting of sepsis, creatinine improving with fluids, 1.7 on admission and further improving today to 1.3 -Encourage p.o. intake  Hypertension -Continue home amlodipine  Left lower extremity cellulitis -Antibiotics as per #1  Chronic diastolic CHF -Continue home regimen, stable.  Avoid further fluids to prevent overload  Prior CVA -Continue home Plavix.  She does not appear to be on a statin  Constipation/fecal impaction -Patient with several good bowel movements in the last 24 hours, continue bowel regimen   Scheduled Meds: . amLODipine  10 mg Oral Daily  . calcium-vitamin D   Oral Daily  . clopidogrel  75 mg Oral Daily  . cyanocobalamin  1,000 mcg Intramuscular Q30 days  . heparin  5,000 Units Subcutaneous Q8H  .  hydrocerin   Topical Daily  . isosorbide mononitrate  120 mg Oral Daily  . multivitamin with minerals  1 tablet Oral Daily  . polyethylene glycol  17 g Oral BID  . senna-docusate  2 tablet Oral QHS   Continuous Infusions: . ceFEPime (MAXIPIME) IV 2 g (12/26/19 1035)  . metronidazole 500 mg (12/26/19 0542)  . vancomycin 1,000 mg (12/26/19 1123)   PRN Meds:.acetaminophen, acetaminophen, polyethylene glycol  DVT prophylaxis: Heparin Code Status: Full code Family Communication: Discussed with patient Disposition Plan: Home when ready  Consultants:  OB/GYN PCCM  Procedures:  None   Microbiology  Urine culture 12/26-no growth, final Blood cultures 12/26-no growth, pending SARS-CoV-2 12/26-negative  Antimicrobials: Vancomycin 12/26 Cefepime 12/26 Metronidazole 12/26   Objective: Vitals:   12/25/19 0509 12/25/19 1500 12/25/19 2020 12/26/19 0540  BP: 132/84 (!) 119/50 104/81 (!) 116/50  Pulse: 83 78 88 75  Resp: (!) 24 20 20 19   Temp: 98.5 F (36.9 C) 97.8 F (36.6 C) 98.3 F (36.8 C) (!) 97.5 F (36.4 C)  TempSrc: Oral Oral    SpO2: 99% (!) 84% 100% 100%  Weight: 110 kg       Intake/Output Summary (Last 24 hours) at 12/26/2019 1340 Last data filed at 12/26/2019 1300 Gross per 24 hour  Intake 1869.25 ml  Output --  Net 1869.25 ml   Filed Weights   12/24/19 1120 12/25/19 0509  Weight: 124.7 kg 110 kg    Examination:  Constitutional: NAD Eyes: No icterus ENMT: mmm Neck: normal, supple Respiratory: CTA bilaterally without wheezing or crackles Cardiovascular: Regular rate and rhythm, trace edema, chronic venous stasis changes Abdomen:  Soft, non distended, nontender Skin: No new rashes Neurologic: No new focal deficits, Psychiatric: Normal judgment and insight. Alert and oriented x 3. Normal mood.    Data Reviewed: I have independently reviewed following labs and imaging studies   CBC: Recent Labs  Lab 12/24/19 1030 12/25/19 0617 12/26/19 0534   WBC 17.8* 15.7* 11.9*  NEUTROABS 16.4*  --   --   HGB 11.6* 10.2* 9.8*  HCT 38.2 35.7* 32.6*  MCV 96.7 102.9* 99.7  PLT 374 317 [id number]   Basic Metabolic Panel: Recent Labs  Lab 12/24/19 1030 12/25/19 0617 12/26/19 0534  NA 137 137 140  K 4.1 4.0 3.7  CL 104 110 112*  CO2 20* 17* 21*  GLUCOSE 138* 104* 99  BUN 19 27* 30*  CREATININE 1.75* 1.62* 1.30*  CALCIUM 8.8* 8.2* 8.2*   Liver Function Tests: Recent Labs  Lab 12/24/19 1030 12/25/19 0617 12/26/19 0534  AST 78* 46* 43*  ALT 30 27 26   ALKPHOS 96 67 65  BILITOT 1.4* 0.9 1.1  PROT 7.9 6.9 6.3*  ALBUMIN 2.8* 2.4* 2.2*   Coagulation Profile: Recent Labs  Lab 12/24/19 1030 12/25/19 0617  INR 1.3* 1.3*   HbA1C: No results for input(s): HGBA1C in the last 72 hours. CBG: No results for input(s): GLUCAP in the last 168 hours.  Recent Results (from the past 240 hour(s))  Culture, blood (Routine x 2)     Status: None (Preliminary result)   Collection Time: 12/24/19 10:30 AM   Specimen: BLOOD  Result Value Ref Range Status   Specimen Description   Final    BLOOD RIGHT ANTECUBITAL Performed at [facility], [place] [address], [place], [place] [address]    Special Requests   Final    BOTTLES DRAWN AEROBIC AND ANAEROBIC Blood Culture adequate volume Performed at [place] [address] [place], [place] [address]    Culture   Final    NO GROWTH < 24 HOURS Performed at [facility] [address], [place], [place] [address]    Report Status PENDING  Incomplete  Culture, blood (Routine x 2)     Status: None (Preliminary result)   Collection Time: 12/24/19 10:35 AM   Specimen: BLOOD LEFT HAND  Result Value Ref Range Status   Specimen Description   Final    BLOOD LEFT HAND Performed at [facility], [place] [address], [place], [place] [address]    Special Requests   Final    BOTTLES DRAWN AEROBIC AND ANAEROBIC Blood Culture results may not be optimal due to an inadequate  volume of blood received in culture bottles Performed at [place] [address], [place], [place] [address]    Culture   Final    NO GROWTH < 24 HOURS Performed at [place] [address] [place], [place] [address]    Report Status PENDING  Incomplete  Urine culture     Status: None   Collection Time: 12/24/19 10:39 AM   Specimen: In/Out Cath Urine  Result Value Ref Range Status   Specimen Description   Final    IN/OUT CATH URINE Performed at [place] [address], [place], [place] [address]    Special Requests   Final    NONE Performed at [facility], [place] [address], [place], [place] [address]    Culture   Final    NO GROWTH Performed at [facility], [address], [place], [place] [address]    Report Status 12/25/2019 FINAL  Final  SARS CORONAVIRUS 2 (TAT 6-24 HRS) Nasopharyngeal Nasopharyngeal Swab     Status: None   Collection Time: 12/24/19  1:32 PM   Specimen: Nasopharyngeal Swab  Result Value Ref Range Status   SARS Coronavirus 2 NEGATIVE NEGATIVE Final    Comment: (NOTE) SARS-CoV-2 target nucleic acids are NOT DETECTED. The SARS-CoV-2 RNA is generally detectable in upper and lower respiratory specimens during the acute phase of infection. Negative results do not preclude SARS-CoV-2 infection, do not rule out co-infections with other pathogens, and should not be used as the sole basis for treatment or other patient management decisions. Negative results must be combined with clinical observations, patient history, and epidemiological information. The expected result is Negative. Fact Sheet for Patients: SugarRoll.be Fact Sheet for Healthcare Providers: https://www.woods-mathews.com/ This test is not yet approved or cleared by the [place] FDA and  has been authorized for detection and/or diagnosis of SARS-CoV-2 by FDA under an  Emergency Use Authorization (EUA). This EUA will remain  in effect (meaning this test can be used) for the duration of the COVID-19 declaration under Section 56 4(b)(1) of the Act, 21 U.S.C. section 360bbb-3(b)(1), unless the authorization is terminated or revoked sooner. Performed at [facility], [place] [address], [place], [place] [address]   MRSA PCR Screening     Status: None   Collection Time: 12/25/19  7:04 PM   Specimen: Nasal Mucosa; Nasopharyngeal  Result Value Ref Range Status   MRSA by PCR NEGATIVE NEGATIVE Final    Comment:        The GeneXpert MRSA Assay (FDA approved for NASAL specimens only), is one component of a comprehensive MRSA colonization surveillance program. It is not intended to diagnose MRSA infection nor to guide or monitor treatment for MRSA infections. Performed at [facility], [place] [address] [place], [place] [address]      Radiology Studies: No results found.  Marzetta Board, MD, PhD [facility]  Between 7 am - 7 pm I am available, please contact me via Amion or Securechat  Between 7 pm - 7 am I am not available, please contact night coverage MD/APP via Amion

## 2019-12-26 NOTE — Plan of Care (Signed)
  Problem: Education: Goal: Knowledge of General Education information will improve Description: Including pain rating scale, medication(s)/side effects and non-pharmacologic comfort measures Outcome: Progressing   Problem: Clinical Measurements: Goal: Respiratory complications will improve Outcome: Progressing Goal: Cardiovascular complication will be avoided Outcome: Progressing   Problem: Activity: Goal: Risk for activity intolerance will decrease Outcome: Progressing   Problem: Coping: Goal: Level of anxiety will decrease Outcome: Progressing   Problem: Elimination: Goal: Will not experience complications related to bowel motility Outcome: Progressing Goal: Will not experience complications related to urinary retention Outcome: Progressing   Problem: Pain Managment: Goal: General experience of comfort will improve Outcome: Progressing   Problem: Safety: Goal: Ability to remain free from injury will improve Outcome: Progressing   Problem: Skin Integrity: Goal: Risk for impaired skin integrity will decrease Outcome: Progressing   

## 2019-12-26 NOTE — Progress Notes (Signed)
Pharmacy Antibiotic Note  Connie Miranda is a 85 y.o. female admitted on 12/24/2019 with sepsis with concern for cellulitis and endometritis.  Pharmacy was consulted for vancomycin and cefepime dosing.  Also on metronidazole with MD dosing.  Fever resolved Leukocytosis improving SCr improving  Lactate still elevated on 12/27 MRSA PCR negative  Plan:  Continue cefepime 2 grams q12h and metronidazole 500 mg q8h  Consider stopping vancomycin given negative MRSA PCR - await attending MD review  In the interim, no increase to vancomycin dosage given resolution of fever and improvement in WBC   Weight: 242 lb 8.1 oz (110 kg)  Temp (24hrs), Avg:97.9 F (36.6 C), Min:97.5 F (36.4 C), Max:98.3 F (36.8 C)  Recent Labs  Lab 12/24/19 1030 12/24/19 1358 12/24/19 1640 12/24/19 1912 12/25/19 0617 12/25/19 0831 12/26/19 0534  WBC 17.8*  --   --   --  15.7*  --  11.9*  CREATININE 1.75*  --   --   --  1.62*  --  1.30*  LATICACIDVEN 4.3* 4.7* 4.7* 2.8* 3.0* 2.7*  --     CrCl cannot be calculated (Unknown ideal weight.).    Antimicrobials this admission: 12/26 cefepime>>  12/26 vanco >>  12/26 metronidazole >>  Microbiology results: 12/26 BCx: ngtd 12/26 UCx: NGF 12/26 SARS-CoV_2: negative 12/27 MRSA PCR: negative    Allergies  Allergen Reactions  . Asa [Aspirin] Other (See Comments)    Doctor told her not to take due to type anemia  . Phosphate Other (See Comments)    Anything containing phosphate 6. Unknown reaction  . Codeine Other (See Comments)    Made her too sleepy  . Penicillins Itching and Swelling    Tolerates keflex  Did it involve swelling of the face/tongue/throat, SOB, or low BP? unknown Did it involve sudden or severe rash/hives, skin peeling, or any reaction on the inside of your mouth or nose? unknown Did you need to seek medical attention at a hospital or doctor's office? unknown When did it last happen?years ago. If all above answers are  "NO", may proceed with cephalosporin use.   . Sodium Phosphate      Thank you for allowing pharmacy to be a part of this patient's care.  Clayburn Pert, PharmD, BCPS 12/26/2019  8:53 AM

## 2019-12-27 ENCOUNTER — Inpatient Hospital Stay (HOSPITAL_COMMUNITY): Payer: Medicare Other

## 2019-12-27 LAB — CREATININE, SERUM
Creatinine, Ser: 1.15 mg/dL — ABNORMAL HIGH (ref 0.44–1.00)
GFR calc Af Amer: 51 mL/min — ABNORMAL LOW (ref >60)
GFR calc non Af Amer: 44 mL/min — ABNORMAL LOW (ref >60)

## 2019-12-27 MED ORDER — METRONIDAZOLE IN NACL 5-0.79 MG/ML-% IV SOLN
500.0000 mg | Freq: Three times a day (TID) | INTRAVENOUS | Status: DC
  Administered 2019-12-27 – 2020-01-03 (×20): 500 mg via INTRAVENOUS
  Filled 2019-12-27 (×20): qty 100

## 2019-12-27 MED ORDER — SODIUM CHLORIDE 0.9 % IV SOLN
2.0000 g | INTRAVENOUS | Status: DC
  Administered 2019-12-27 – 2020-01-02 (×7): 2 g via INTRAVENOUS
  Filled 2019-12-27 (×3): qty 2
  Filled 2019-12-27: qty 20
  Filled 2019-12-27 (×4): qty 2

## 2019-12-27 NOTE — Care Management Important Message (Signed)
Important Message  Patient Details IM Letter given to [facility] Case Manager to present to the Patient Name: Connie Miranda MRN: [id number] Date of Birth: 1933-06-04   Medicare Important Message Given:  Yes     Kerin Salen 12/27/2019, 11:04 AM

## 2019-12-27 NOTE — Progress Notes (Signed)
PROGRESS NOTE  Connie Miranda [id number] DOB: Feb 09, 1933 DOA: 12/24/2019 PCP: [name], MD   LOS: 3 days   Brief Narrative / Interim history: 85 year old female with prior CVA, HTN, chronic anemia, G6PD deficiency, recent vulval abscess, dysphagia who was brought in from home where she has been bedbound for almost past 5 years, with worsening confusion and suspected hallucinations.  She was diagnosed with cellulitis of the vulva and has been on Keflex for the last week.  She was found to be septic in the ER with suspected vulvar abscess and left lower extremity cellulitis  Subjective / 24h Interval events: Alert, no significant complaints.  Assessment & Plan: Principal Problem Sepsis with concern for cellulitis as well as endometritis /old IUD/thickened endometrium -OB/GYN consulted, appreciate input.  Exam difficult due to significant constipation with fecal impaction -For now continue broad-spectrum antibiotic with cefepime, Flagyl.  Discontinue Vanco as MRSA was negative -Sepsis physiology improved, white count improving -OB/GYN plans for D&C to sample the lining of the uterus  Active Problems Acute kidney injury -Likely in the setting of sepsis, creatinine improving with fluids, 1.7 on admission.  Continues to improve, today 1.15  Intermittent encephalopathy -Patient slightly more confused this morning, obtain a CT scan of the brain without acute findings  Hypertension -Continue home amlodipine  Left lower extremity cellulitis -Antibiotics as per #1  Chronic diastolic CHF -Continue home regimen, stable.  Avoid further fluids to prevent overload  Prior CVA -Continue home Plavix.  She does not appear to be on a statin  Constipation/fecal impaction -Resolved   Scheduled Meds: . amLODipine  10 mg Oral Daily  . calcium-vitamin D   Oral Daily  . clopidogrel  75 mg Oral Daily  . cyanocobalamin  1,000 mcg Intramuscular Q30 days  . heparin  5,000 Units  Subcutaneous Q8H  . hydrocerin   Topical Daily  . isosorbide mononitrate  120 mg Oral Daily  . multivitamin with minerals  1 tablet Oral Daily  . polyethylene glycol  17 g Oral BID  . senna-docusate  2 tablet Oral QHS   Continuous Infusions: . ceFEPime (MAXIPIME) IV 2 g (12/27/19 0937)  . metronidazole 500 mg (12/27/19 0519)   PRN Meds:.acetaminophen, acetaminophen, polyethylene glycol  DVT prophylaxis: Heparin Code Status: Full code Family Communication: Discussed with patient Disposition Plan: Home when ready  Consultants:  OB/GYN PCCM  Procedures:  None   Microbiology  Urine culture 12/26-no growth, final Blood cultures 12/26-no growth, pending SARS-CoV-2 12/26-negative  Antimicrobials: Vancomycin 12/26 Cefepime 12/26 Metronidazole 12/26   Objective: Vitals:   12/26/19 1445 12/26/19 2000 12/26/19 2151 12/27/19 0500  BP: (!) 134/55  (!) 112/50 (!) 119/48  Pulse: 84  80 81  Resp: 19  16 16   Temp: 98 F (36.7 C)  98.5 F (36.9 C) 98.7 F (37.1 C)  TempSrc: Oral  Oral Oral  SpO2: 97%  93% 95%  Weight:  110 kg    Height:  5' 3.5" (1.613 m)      Intake/Output Summary (Last 24 hours) at 12/27/2019 1324 Last data filed at 12/27/2019 1224 Gross per 24 hour  Intake 880 ml  Output --  Net 880 ml   Filed Weights   12/24/19 1120 12/25/19 0509 12/26/19 2000  Weight: 124.7 kg 110 kg 110 kg    Examination:  Constitutional: No distress Eyes: No scleral icterus ENMT: mmm Neck: normal, supple Respiratory: Clear bilaterally, no wheezing Cardiovascular: Regular rate and rhythm, trace edema, chronic venous stasis changes Abdomen: Soft, nontender, nondistended, bowel  sounds positive Skin: No new rashes Neurologic: No focal deficits, generalized weakness present throughout but mainly in the legs Psychiatric: Normal judgment and insight. Alert and oriented x 3. Normal mood.    Data Reviewed: I have independently reviewed following labs and imaging studies    CBC: Recent Labs  Lab 01-07-2020 1030 12/25/19 0617 12/26/19 0534  WBC 17.8* 15.7* 11.9*  NEUTROABS 16.4*  --   --   HGB 11.6* 10.2* 9.8*  HCT 38.2 35.7* 32.6*  MCV 96.7 102.9* 99.7  PLT 374 317 [id number]   Basic Metabolic Panel: Recent Labs  Lab 2020/01/07 1030 12/25/19 0617 12/26/19 0534 12/27/19 0538  NA 137 137 140  --   K 4.1 4.0 3.7  --   CL 104 110 112*  --   CO2 20* 17* 21*  --   GLUCOSE 138* 104* 99  --   BUN 19 27* 30*  --   CREATININE 1.75* 1.62* 1.30* 1.15*  CALCIUM 8.8* 8.2* 8.2*  --    Liver Function Tests: Recent Labs  Lab 2020-01-07 1030 12/25/19 0617 12/26/19 0534  AST 78* 46* 43*  ALT 30 27 26   ALKPHOS 96 67 65  BILITOT 1.4* 0.9 1.1  PROT 7.9 6.9 6.3*  ALBUMIN 2.8* 2.4* 2.2*   Coagulation Profile: Recent Labs  Lab 01-07-2020 1030 12/25/19 0617  INR 1.3* 1.3*   HbA1C: No results for input(s): HGBA1C in the last 72 hours. CBG: No results for input(s): GLUCAP in the last 168 hours.  Recent Results (from the past 240 hour(s))  Culture, blood (Routine x 2)     Status: None (Preliminary result)   Collection Time: 2020/01/07 10:30 AM   Specimen: BLOOD  Result Value Ref Range Status   Specimen Description   Final    BLOOD RIGHT ANTECUBITAL Performed at [facility], [place] [address], [place], [place] [address]    Special Requests   Final    BOTTLES DRAWN AEROBIC AND ANAEROBIC Blood Culture adequate volume Performed at [place] [address] [place], [place] [address]    Culture   Final    NO GROWTH 3 DAYS Performed at [facility], [place] [address], [place], [place] [address]    Report Status PENDING  Incomplete  Culture, blood (Routine x 2)     Status: None (Preliminary result)   Collection Time: Jan 07, 2020 10:35 AM   Specimen: BLOOD LEFT HAND  Result Value Ref Range Status   Specimen Description   Final    BLOOD LEFT HAND Performed at [facility], [place] [address] [place], [place] [address]     Special Requests   Final    BOTTLES DRAWN AEROBIC AND ANAEROBIC Blood Culture results may not be optimal due to an inadequate volume of blood received in culture bottles Performed at [place] [address], [place], [place] [address]    Culture   Final    NO GROWTH 3 DAYS Performed at [facility], [place] [address], [place], [place] [address]    Report Status PENDING  Incomplete  Urine culture     Status: None   Collection Time: 01-07-20 10:39 AM   Specimen: In/Out Cath Urine  Result Value Ref Range Status   Specimen Description   Final    IN/OUT CATH URINE Performed at [place] [address], [place], [place] [address]    Special Requests   Final    NONE Performed at  [facility], [place] [address], [place], [place] [address]    Culture   Final    NO GROWTH Performed at [facility], [place] [address] [place], [place] [address]    Report Status 12/25/2019 FINAL  Final  SARS CORONAVIRUS 2 (TAT 6-24 HRS) Nasopharyngeal Nasopharyngeal Swab     Status: None   Collection Time: 12/24/19  1:32 PM   Specimen: Nasopharyngeal Swab  Result Value Ref Range Status   SARS Coronavirus 2 NEGATIVE NEGATIVE Final    Comment: (NOTE) SARS-CoV-2 target nucleic acids are NOT DETECTED. The SARS-CoV-2 RNA is generally detectable in upper and lower respiratory specimens during the acute phase of infection. Negative results do not preclude SARS-CoV-2 infection, do not rule out co-infections with other pathogens, and should not be used as the sole basis for treatment or other patient management decisions. Negative results must be combined with clinical observations, patient history, and epidemiological information. The expected result is Negative. Fact Sheet for Patients: SugarRoll.be Fact Sheet for Healthcare Providers: https://www.woods-mathews.com/ This test is not yet approved or  cleared by the [place] FDA and  has been authorized for detection and/or diagnosis of SARS-CoV-2 by FDA under an Emergency Use Authorization (EUA). This EUA will remain  in effect (meaning this test can be used) for the duration of the COVID-19 declaration under Section 56 4(b)(1) of the Act, 21 U.S.C. section 360bbb-3(b)(1), unless the authorization is terminated or revoked sooner. Performed at [facility], [place] [address], [place], [place] [address]   MRSA PCR Screening     Status: None   Collection Time: 12/25/19  7:04 PM   Specimen: Nasal Mucosa; Nasopharyngeal  Result Value Ref Range Status   MRSA by PCR NEGATIVE NEGATIVE Final    Comment:        The GeneXpert MRSA Assay (FDA approved for NASAL specimens only), is one component of a comprehensive MRSA colonization surveillance program. It is not intended to diagnose MRSA infection nor to guide or monitor treatment for MRSA infections. Performed at [facility], [place] [address] [place], [place] [address]      Radiology Studies: CT HEAD WO CONTRAST  Result Date: 12/27/2019 CLINICAL DATA:  Altered mental status EXAM: CT HEAD WITHOUT CONTRAST TECHNIQUE: Contiguous axial images were obtained from the base of the skull through the vertex without intravenous contrast. COMPARISON:  CT head dated 03/24/2019 FINDINGS: Brain: No evidence of acute infarction, hemorrhage, hydrocephalus, extra-axial collection or mass lesion/mass effect. There is moderate cerebral volume loss with associated ex vacuo dilatation. Periventricular white matter hypoattenuation likely represents chronic small vessel ischemic disease. Vascular: There are vascular calcifications in the carotid siphons. Skull: Normal. Negative for fracture or focal lesion. Sinuses/Orbits: No acute finding. Other: None. IMPRESSION: 1. No acute intracranial process. Electronically Signed   By: Zerita Boers M.D.   On: 12/27/2019 10:42   US PELVIS  (TRANSABDOMINAL ONLY)  Result Date: 12/26/2019 CLINICAL DATA:  Initial evaluation for thickened endometrium. EXAM: TRANSABDOMINAL ULTRASOUND OF PELVIS TECHNIQUE: Transabdominal ultrasound examination of the pelvis was performed including evaluation of the uterus, ovaries, adnexal regions, and pelvic cul-de-sac. COMPARISON:  Prior CT from 12/24/2019. FINDINGS: Uterus Measurements: 11.0 x 5.8 x 7.4 cm = volume: 248.8 mL. No fibroids or other mass visualized. Endometrium Thickness: 19.1 mm.  No focal abnormality visualized. Right ovary Not visualized.  No adnexal mass. Left ovary Not visualized.  No adnexal mass. Other findings:  No abnormal free fluid. IMPRESSION: 1. Thickened endometrial stripe measuring  up to 19.1 mm in thickness. Endometrial thickness is considered abnormal for an asymptomatic post-menopausal female. Endometrial sampling should be considered to exclude carcinoma. 2. Nonvisualization of the ovaries. No adnexal mass or abnormal free fluid within the pelvis. 3. No other acute abnormality. Electronically Signed   By: Jeannine Boga M.D.   On: 12/26/2019 21:13    Marzetta Board, MD, PhD [facility]  Between 7 am - 7 pm I am available, please contact me via Amion or Securechat  Between 7 pm - 7 am I am not available, please contact night coverage MD/APP via Amion

## 2019-12-27 NOTE — Progress Notes (Signed)
Gynecology Progress Note  Admission Date: 12/24/2019 Current Date: 12/27/2019 2:15 PM  Connie Miranda is a 85 y.o. HD#4 admitted for sepsis with unknown source, cellulitis vs endometritis, AKI, h/o HTN, chronic CHF, h/o CVA, severe fecal impaction.  History complicated by: Patient Active Problem List   Diagnosis Date Noted  . Thickened endometrium   . Shock circulatory ([place]) 12/24/2019  . Chronic diastolic CHF (congestive heart failure) ([place]) 06/10/2018  . Sepsis ([place]) 06/10/2018  . Acute respiratory failure with hypoxia ([place]) 06/10/2018  . Hypokalemia 06/10/2018  . Acute bronchitis 06/10/2018  . Lower extremity weakness 02/11/2016  . Difficulty walking 04/08/2013  . Weakness of both legs 04/08/2013  . Morbid obesity ([place]) 04/08/2013  . AKI (acute kidney injury) ([place]) 01/28/2013  . Hyperkalemia 01/28/2013  . Fall 01/28/2013  . Nausea 01/28/2013  . Lymphedema 10/16/2012  . Cellulitis 10/16/2012  . Diarrhea 10/13/2012  . Cellulitis of left lower leg 10/12/2012  . Anemia 10/12/2012  . Abdominal pain 10/12/2012  . Hip pain 10/12/2012  . Hypertension   . Edema   . Pernicious anemia   . Glucose-6-phosphate dehydrogenase deficiency   . Osteoarthritis   . Stroke [facility]) 09/28/2000    Subjective:  Patient with altered mental status today, confused. Unable to answer basic questions. Could not tell me the date/year. Could only answer yes/no questions with prompting.  Objective:   Vitals:   12/26/19 2000 12/26/19 2151 12/27/19 0500 12/27/19 1348  BP:  (!) 112/50 (!) 119/48 117/64  Pulse:  80 81 85  Resp:  16 16 18   Temp:  98.5 F (36.9 C) 98.7 F (37.1 C) 98.8 F (37.1 C)  TempSrc:  Oral Oral Oral  SpO2:  93% 95% 97%  Weight: 110 kg     Height: 5' 3.5" (1.613 m)       Total I/O In: 480 [P.O.:480] Out: -   Intake/Output Summary (Last 24 hours) at 12/27/2019 1415 Last data filed at 12/27/2019 1224 Gross per 24 hour  Intake 880 ml  Output --  Net 880 ml     Physical exam: BP 117/64 (BP Location: Right Arm)   Pulse 85   Temp 98.8 F (37.1 C) (Oral)   Resp 18   Ht 5' 3.5" (1.613 m)   Wt 110 kg   SpO2 97%   BMI 42.28 kg/m  CONSTITUTIONAL: Well-developed, well-nourished female in no acute distress.  NEUROLOGIC: Alert, oriented to person. PSYCHIATRIC: Normal mood, mildly confused affect. Seemed confused with basic questions CARDIOVASCULAR: Normal heart rate noted RESPIRATORY: Effort normal, no problems with respiration noted. ABDOMEN: Soft, significant dependent edema throughout trunk, no distention noted.  PELVIC: bilateral enlarged labia minora, soft and without evidence of infection, single digit vaginal exam demonstrates small amount of blood discharge in vaginal vault, no fecal impaction palpated, unable to palpate cervix secondary to patient discomfort MUSCULOSKELETAL: significant edema of bilateral lower extremities extending to trunk with desquamation of lower extremities  UOP: voiding spontaneously  Labs  Recent Labs  Lab 12/24/19 1030 12/25/19 0617 12/26/19 0534  WBC 17.8* 15.7* 11.9*  HGB 11.6* 10.2* 9.8*  HCT 38.2 35.7* 32.6*  PLT 374 317 267     Assessment & Plan:   Patient is 85 y.o.  HD#4 admitted for sepsis of unknown origin, cellulitis versus endometritis picture. Gyn consulted for IUD in place with thickened endometrial stripe and concern for vulvar abscess. No vulvar abscess noted on exam and patient has been improving on IV antibiotics. Had discussed sampling of the  lining of the uterus with patient and her daughter, to rule out malignancy. Patient unable to tolerate a bedside endometrial biopsy and after discussion, daughter had opted for [facility] hysteroscopy with IUD removal to obtain sampling and also remove potential source of inflammation which could be causing some bleeding.  However today, patient has altered mental status. She appears confused and unable to answer basic questions, only answering yes/no  with prompting. This is a significant change from yesterday and the primary team is investigating, as yet, no apparent cause for change in mental status.   If sepsis is secondary to endometritis, IV antibiotics is the appropriate treatment. Reviewed that sampling is indicated to rule out malignancy and attempt to remove IUD as vaginal bleeding could be due to either, however, endometrial sampling is not urgent at this point as she was improving on antibiotics from a sepsis picture. Will hold off on plans for [facility] now until her AMS resolves per primary team.   I have reviewed this plan with patient's daughter, who verbalizes understanding and is in agreement with plan.    Feliz Beam, M.D. Attending [facility] for [employer])

## 2019-12-27 NOTE — Progress Notes (Signed)
OBGYN Note  Had extended conversation with patient's daughter, Connie Miranda, via telephone. After discussion with patient and family, they would like to proceed with D&C. I reviewed risks of D&C hysteroscopy, anesthesia with her and she desires to proceed. I reviewed that my goal is to sample the lining of the uterus and would also plan on removing IUD, however this may not be possible if IUD breaks or is deeply embedded into uterine wall. Reviewed thickened endometrium could be due to multiple issues including benign hyperplasia, inflammation from IUD or malignancy and that sampling would be the only way to know for sure. She verbalizes understanding and would like to proceed, states she has medical power of attorney for her mother, papers on file, and can sign consent if needed.   Feliz Beam, M.D. Attending [facility] [employer])

## 2019-12-28 ENCOUNTER — Inpatient Hospital Stay (HOSPITAL_COMMUNITY): Payer: Medicare Other

## 2019-12-28 LAB — PROTIME-INR
INR: 1.3 — ABNORMAL HIGH (ref 0.8–1.2)
Prothrombin Time: 15.7 seconds — ABNORMAL HIGH (ref 11.4–15.2)

## 2019-12-28 LAB — BASIC METABOLIC PANEL
Anion gap: 7 (ref 5–15)
BUN: 19 mg/dL (ref 8–23)
CO2: 20 mmol/L — ABNORMAL LOW (ref 22–32)
Calcium: 8.3 mg/dL — ABNORMAL LOW (ref 8.9–10.3)
Chloride: 112 mmol/L — ABNORMAL HIGH (ref 98–111)
Creatinine, Ser: 1.09 mg/dL — ABNORMAL HIGH (ref 0.44–1.00)
GFR calc Af Amer: 54 mL/min — ABNORMAL LOW (ref >60)
GFR calc non Af Amer: 47 mL/min — ABNORMAL LOW (ref >60)
Glucose, Bld: 94 mg/dL (ref 70–99)
Potassium: 3 mmol/L — ABNORMAL LOW (ref 3.5–5.1)
Sodium: 139 mmol/L (ref 135–145)

## 2019-12-28 LAB — CBC
HCT: 29.2 % — ABNORMAL LOW (ref 36.0–46.0)
Hemoglobin: 9 g/dL — ABNORMAL LOW (ref 12.0–15.0)
MCH: 28.7 pg (ref 26.0–34.0)
MCHC: 30.8 g/dL (ref 30.0–36.0)
MCV: 93 fL (ref 80.0–100.0)
Platelets: 317 10*3/uL (ref 150–400)
RBC: 3.14 MIL/uL — ABNORMAL LOW (ref 3.87–5.11)
RDW: 14.6 % (ref 11.5–15.5)
WBC: 9.1 10*3/uL (ref 4.0–10.5)
nRBC: 0 % (ref 0.0–0.2)

## 2019-12-28 MED ORDER — POTASSIUM CHLORIDE 20 MEQ/15ML (10%) PO SOLN
40.0000 meq | Freq: Once | ORAL | Status: AC
  Administered 2019-12-28: 10:00:00 40 meq via ORAL
  Filled 2019-12-28: qty 30

## 2019-12-28 NOTE — Progress Notes (Signed)
PROGRESS NOTE  Connie Miranda [id number] DOB: February 02, 1933 DOA: 12/24/2019 PCP: [name], MD   LOS: 4 days   Brief Narrative / Interim history: 85 year old female with prior CVA, HTN, chronic anemia, G6PD deficiency, recent vulval abscess, dysphagia who was brought in from home where she has been bedbound for almost past 5 years, with worsening confusion and suspected hallucinations.  She was diagnosed with cellulitis of the vulva and has been on Keflex for the last week.  She was found to be septic in the ER.  There was concern for endometritis and OB/GYN was consulted.  Subjective / 24h Interval events: Alert, has no complaints however somewhat repetitive and slow to answer.  When asked whether she has trouble getting the words out she nodded yes  Assessment & Plan: Principal Problem Sepsis with concern for cellulitis as well as endometritis /old IUD/thickened endometrium -OB/GYN consulted, appreciate input.  Plans are in place for patient to eventually go to the OR for potentially IUD removal/endometrial biopsy, however on 12/29 patient became more encephalopathic when compared to the days prior and now surgery is on hold. -She was initially placed on broad-spectrum antibiotics with Vanco, cefepime, Flagyl, MRSA PCR was negative and Vanco was discontinued.  Cefepime has been switched to Ceftriaxone when she became more altered -Sepsis physiology improved, leukocytosis resolved  Active Problems Acute kidney injury -Likely in the setting of sepsis, creatinine normalized with fluids  Intermittent encephalopathy, acute metabolic -Patient slightly more confused 12/29, she was clear and appropriate in a couple of days prior, underwent a CT scan of the brain which was negative -MRI of the brain has been ordered and pending -She remains alert but confused today 12/30 -Cefepime can rarely cause this and she has been switched to ceftriaxone and Flagyl  Hypertension -Continue home  amlodipine, blood pressure stable  Left lower extremity cellulitis -Antibiotics as per #1, resolved  Chronic diastolic CHF -Continue home regimen, stable.  Avoid further fluids to prevent overload  Prior CVA -Continue home Plavix.  She does not appear to be on a statin -MRI of the brain  Constipation/fecal impaction -Resolved, has normal bowel movements now   Scheduled Meds: . amLODipine  10 mg Oral Daily  . calcium-vitamin D   Oral Daily  . clopidogrel  75 mg Oral Daily  . cyanocobalamin  1,000 mcg Intramuscular Q30 days  . heparin  5,000 Units Subcutaneous Q8H  . hydrocerin   Topical Daily  . isosorbide mononitrate  120 mg Oral Daily  . multivitamin with minerals  1 tablet Oral Daily  . polyethylene glycol  17 g Oral BID  . senna-docusate  2 tablet Oral QHS   Continuous Infusions: . cefTRIAXone (ROCEPHIN)  IV Stopped (12/27/19 1748)  . metronidazole 500 mg (12/28/19 0501)   PRN Meds:.acetaminophen, acetaminophen, polyethylene glycol  DVT prophylaxis: Heparin Code Status: Full code Family Communication: Discussed with patient and updated daughter over the phone 12/29  Disposition Plan: Home when ready  Consultants:  OB/GYN PCCM  Procedures:  None   Microbiology  Urine culture 12/26-no growth, final Blood cultures 12/26-no growth, pending SARS-CoV-2 12/26-negative  Antimicrobials: Vancomycin 12/26 Cefepime 12/26 Metronidazole 12/26   Objective: Vitals:   12/27/19 0500 12/27/19 1348 12/27/19 2059 12/28/19 0447  BP: (!) 119/48 117/64 (!) 133/46 (!) 109/56  Pulse: 81 85 75 80  Resp: 16 18 16 16   Temp: 98.7 F (37.1 C) 98.8 F (37.1 C) 98.6 F (37 C) 98.3 F (36.8 C)  TempSrc: Oral Oral Oral Oral  SpO2:  95% 97% 96% 98%  Weight:      Height:        Intake/Output Summary (Last 24 hours) at 12/28/2019 1146 Last data filed at 12/28/2019 0000 Gross per 24 hour  Intake 840 ml  Output --  Net 840 ml   Filed Weights   12/24/19 1120 12/25/19 0509  12/26/19 2000  Weight: 124.7 kg 110 kg 110 kg    Examination:  Constitutional: No distress, alert Eyes: No scleral icterus ENMT: Moist mucous membranes Neck: normal, supple Respiratory: Clear to auscultation bilaterally, no wheezing Cardiovascular: Regular rate and rhythm, trace edema, chronic venous stasis changes bilateral lower extremities Abdomen: Soft, nontender, nondistended, positive bowel sounds Skin: No new rashes, chronic venous stasis changes bilateral legs Neurologic: Slow to follow commands but strength appears equal.  Generalized weakness present  Data Reviewed: I have independently reviewed following labs and imaging studies   CBC: Recent Labs  Lab 12/24/19 1030 12/25/19 0617 12/26/19 0534 12/28/19 0628  WBC 17.8* 15.7* 11.9* 9.1  NEUTROABS 16.4*  --   --   --   HGB 11.6* 10.2* 9.8* 9.0*  HCT 38.2 35.7* 32.6* 29.2*  MCV 96.7 102.9* 99.7 93.0  PLT 374 317 267 [id number]   Basic Metabolic Panel: Recent Labs  Lab 12/24/19 1030 12/25/19 0617 12/26/19 0534 12/27/19 0538 12/28/19 0628  NA 137 137 140  --  139  K 4.1 4.0 3.7  --  3.0*  CL 104 110 112*  --  112*  CO2 20* 17* 21*  --  20*  GLUCOSE 138* 104* 99  --  94  BUN 19 27* 30*  --  19  CREATININE 1.75* 1.62* 1.30* 1.15* 1.09*  CALCIUM 8.8* 8.2* 8.2*  --  8.3*   Liver Function Tests: Recent Labs  Lab 12/24/19 1030 12/25/19 0617 12/26/19 0534  AST 78* 46* 43*  ALT 30 27 26   ALKPHOS 96 67 65  BILITOT 1.4* 0.9 1.1  PROT 7.9 6.9 6.3*  ALBUMIN 2.8* 2.4* 2.2*   Coagulation Profile: Recent Labs  Lab 12/24/19 1030 12/25/19 0617 12/28/19 0628  INR 1.3* 1.3* 1.3*   HbA1C: No results for input(s): HGBA1C in the last 72 hours. CBG: No results for input(s): GLUCAP in the last 168 hours.  Recent Results (from the past 240 hour(s))  Culture, blood (Routine x 2)     Status: None (Preliminary result)   Collection Time: 12/24/19 10:30 AM   Specimen: BLOOD  Result Value Ref Range Status   Specimen  Description   Final    BLOOD RIGHT ANTECUBITAL Performed at [facility], [place] [address], [place], [place] [address]    Special Requests   Final    BOTTLES DRAWN AEROBIC AND ANAEROBIC Blood Culture adequate volume Performed at [place] [address], [place], [place] [address]    Culture   Final    NO GROWTH 4 DAYS Performed at [facility], [place] [address], [place], [place] [address]    Report Status PENDING  Incomplete  Culture, blood (Routine x 2)     Status: None (Preliminary result)   Collection Time: 12/24/19 10:35 AM   Specimen: BLOOD LEFT HAND  Result Value Ref Range Status   Specimen Description   Final    BLOOD LEFT HAND Performed at [facility], [place] [address], [place],  [address]    Special Requests   Final    BOTTLES DRAWN AEROBIC AND ANAEROBIC Blood Culture results may not  be optimal due to an inadequate volume of blood received in culture bottles Performed at [place] [address], [place], [place] [address]    Culture   Final    NO GROWTH 4 DAYS Performed at [facility], [place] [address], [place], [place] [address]    Report Status PENDING  Incomplete  Urine culture     Status: None   Collection Time: 12/24/19 10:39 AM   Specimen: In/Out Cath Urine  Result Value Ref Range Status   Specimen Description   Final    IN/OUT CATH URINE Performed at [facility] [place] [address], [place], [place] [address]    Special Requests   Final    NONE Performed at [facility], [place] [address], [place], [place] [address]    Culture   Final    NO GROWTH Performed at [facility], [place] [address], [place], [place] [address]    Report Status 12/25/2019 FINAL  Final  SARS CORONAVIRUS 2 (TAT 6-24 HRS) Nasopharyngeal Nasopharyngeal Swab     Status: None   Collection Time: 12/24/19  1:32 PM   Specimen: Nasopharyngeal Swab  Result Value Ref Range Status    SARS Coronavirus 2 NEGATIVE NEGATIVE Final    Comment: (NOTE) SARS-CoV-2 target nucleic acids are NOT DETECTED. The SARS-CoV-2 RNA is generally detectable in upper and lower respiratory specimens during the acute phase of infection. Negative results do not preclude SARS-CoV-2 infection, do not rule out co-infections with other pathogens, and should not be used as the sole basis for treatment or other patient management decisions. Negative results must be combined with clinical observations, patient history, and epidemiological information. The expected result is Negative. Fact Sheet for Patients: SugarRoll.be Fact Sheet for Healthcare Providers: https://www.woods-mathews.com/ This test is not yet approved or cleared by the [place] FDA and  has been authorized for detection and/or diagnosis of SARS-CoV-2 by FDA under an Emergency Use Authorization (EUA). This EUA will remain  in effect (meaning this test can be used) for the duration of the COVID-19 declaration under Section 56 4(b)(1) of the Act, 21 U.S.C. section 360bbb-3(b)(1), unless the authorization is terminated or revoked sooner. Performed at [facility], [place] [address], [place], [place] [address]   MRSA PCR Screening     Status: None   Collection Time: 12/25/19  7:04 PM   Specimen: Nasal Mucosa; Nasopharyngeal  Result Value Ref Range Status   MRSA by PCR NEGATIVE NEGATIVE Final    Comment:        The GeneXpert MRSA Assay (FDA approved for NASAL specimens only), is one component of a comprehensive MRSA colonization surveillance program. It is not intended to diagnose MRSA infection nor to guide or monitor treatment for MRSA infections. Performed at [facility], [place] [address] [place], [place] [address]      Radiology Studies: No results found.  Marzetta Board, MD, PhD [facility]  Between 7 am - 7 pm I am available, please contact  me via Amion or Securechat  Between 7 pm - 7 am I am not available, please contact night coverage MD/APP via Amion

## 2019-12-28 NOTE — Progress Notes (Signed)
MRI staff thought doing the MRI was possible so they attempted again.  Unable to complete d/t pt's size.  MRI scheduled at [facility] for tomorrow at [place] called for pickup approx 9am tomorrow. Dr. Cruzita Lederer text paged to update.

## 2019-12-28 NOTE — Progress Notes (Signed)
Gynecology Progress Note  Admission Date: 12/24/2019 Current Date: 12/28/2019 1:43 PM  Connie Miranda is a 85 y.o. HD#5 admitted for sepsis with unknown source, cellulitis vs endometritis, AKI, h/o HTN, chronic CHF, h/o CVA, severe fecal impaction.   History complicated by: Patient Active Problem List   Diagnosis Date Noted  . Thickened endometrium   . Shock circulatory ([place]) 12/24/2019  . Chronic diastolic CHF (congestive heart failure) ([place]) 06/10/2018  . Sepsis ([place]) 06/10/2018  . Acute respiratory failure with hypoxia ([place]) 06/10/2018  . Hypokalemia 06/10/2018  . Acute bronchitis 06/10/2018  . Lower extremity weakness 02/11/2016  . Difficulty walking 04/08/2013  . Weakness of both legs 04/08/2013  . Morbid obesity ([place]) 04/08/2013  . AKI (acute kidney injury) ([place]) 01/28/2013  . Hyperkalemia 01/28/2013  . Fall 01/28/2013  . Nausea 01/28/2013  . Lymphedema 10/16/2012  . Cellulitis 10/16/2012  . Diarrhea 10/13/2012  . Cellulitis of left lower leg 10/12/2012  . Anemia 10/12/2012  . Abdominal pain 10/12/2012  . Hip pain 10/12/2012  . Hypertension   . Edema   . Pernicious anemia   . Glucose-6-phosphate dehydrogenase deficiency   . Osteoarthritis   . Stroke [facility]) 09/28/2000    Subjective:  Patient remains confused today. Could not answer questions about where she was or what today is.  Objective:   Vitals:   12/27/19 0500 12/27/19 1348 12/27/19 2059 12/28/19 0447  BP: (!) 119/48 117/64 (!) 133/46 (!) 109/56  Pulse: 81 85 75 80  Resp: 16 18 16 16   Temp: 98.7 F (37.1 C) 98.8 F (37.1 C) 98.6 F (37 C) 98.3 F (36.8 C)  TempSrc: Oral Oral Oral Oral  SpO2: 95% 97% 96% 98%  Weight:      Height:       No intake/output data recorded.  Intake/Output Summary (Last 24 hours) at 12/28/2019 1343 Last data filed at 12/28/2019 0000 Gross per 24 hour  Intake 600 ml  Output --  Net 600 ml    Physical exam: BP (!) 109/56 (BP Location: Right Wrist)   Pulse  80   Temp 98.3 F (36.8 C) (Oral)   Resp 16   Ht 5' 3.5" (1.613 m)   Wt 110 kg   SpO2 98%   BMI 42.28 kg/m  CONSTITUTIONAL: Well-developed, well-nourished female in no acute distress. Very sleepy on arrival, unable to stay awake for conversation NEUROLOGIC: Alert and oriented to person PSYCHIATRIC: Normal mood, cannot answer basic questions as to date and location, confused CARDIOVASCULAR: Normal heart rate noted RESPIRATORY: Effort normal, no problems with respiration noted. ABDOMEN: Soft, significant dependent edema throughout trunk, no distention noted PELVIC: deferred MUSCULOSKELETAL: significant edema of bilateral lower extremities extending to trunk with desquamation of lower extremities  UOP: voiding spontaneously  Labs  Recent Labs  Lab 12/25/19 0617 12/26/19 0534 12/28/19 0628  WBC 15.7* 11.9* 9.1  HGB 10.2* 9.8* 9.0*  HCT 35.7* 32.6* 29.2*  PLT 317 267 317    Assessment & Plan:   Patient is 85 y.o. HD#5 admitted for sepsis of unknown origin, cellulitis versus endometritis picture. Gyn consulted for IUD in place with thickened endometrial stripe and concern for vulvar abscess. No vulvar abscess noted on exam and patient has been improving on IV antibiotics. Had discussed sampling of the lining of the uterus with patient and her daughter, to rule out malignancy and attempt removal of IUD, which patient and daughter desired.   However, on 12/27/19, patient had new onset confusion and encephalopathy and thus  D&C deferred while this is being worked up. She remains confused today. Pt for MRI tonight.   Will defer management of encephalopathy to primary team.   Patient and her daughter had expressed an interest in endometrial sampling and IUD removal, which needs to be done in the OR prior to discharge (if possible) as bedside sampling would not be possible secondary to patient discomfort/mobility (and given her bedridden status, would not be able to get her in to office to  do this). However, endometrial sampling is not primary concern and is not necessary until she is stable per primary team.   We will continue to follow remotely, please call [phone] when patient is stable and appropriate for D&C or has acute changes to status.   For OB/GYN issues, please call the [facility] for [place] at [place] Monday - Friday, 8 am - 5 pm: [phone] All other times: [phone]   K. Arvilla Meres, M.D. Attending Center for [employer] [facility] [employer] [facility])

## 2019-12-29 ENCOUNTER — Ambulatory Visit (HOSPITAL_COMMUNITY)
Admit: 2019-12-29 | Discharge: 2019-12-29 | Disposition: A | Discharge status: Home / Self Care | Payer: Medicare Other | Attending: Internal Medicine | Admitting: Internal Medicine

## 2019-12-29 LAB — CULTURE, BLOOD (ROUTINE X 2)
Culture: NO GROWTH
Culture: NO GROWTH
Special Requests: ADEQUATE

## 2019-12-29 LAB — CBC
HCT: 27.4 % — ABNORMAL LOW (ref 36.0–46.0)
Hemoglobin: 8.5 g/dL — ABNORMAL LOW (ref 12.0–15.0)
MCH: 29.4 pg (ref 26.0–34.0)
MCHC: 31 g/dL (ref 30.0–36.0)
MCV: 94.8 fL (ref 80.0–100.0)
Platelets: 350 10*3/uL (ref 150–400)
RBC: 2.89 MIL/uL — ABNORMAL LOW (ref 3.87–5.11)
RDW: 14.7 % (ref 11.5–15.5)
WBC: 7.8 10*3/uL (ref 4.0–10.5)
nRBC: 0 % (ref 0.0–0.2)

## 2019-12-29 LAB — BASIC METABOLIC PANEL
Anion gap: 9 (ref 5–15)
BUN: 17 mg/dL (ref 8–23)
CO2: 20 mmol/L — ABNORMAL LOW (ref 22–32)
Calcium: 8.3 mg/dL — ABNORMAL LOW (ref 8.9–10.3)
Chloride: 111 mmol/L (ref 98–111)
Creatinine, Ser: 0.92 mg/dL (ref 0.44–1.00)
GFR calc Af Amer: >60 mL/min (ref >60)
GFR calc non Af Amer: 57 mL/min — ABNORMAL LOW (ref >60)
Glucose, Bld: 106 mg/dL — ABNORMAL HIGH (ref 70–99)
Potassium: 3.1 mmol/L — ABNORMAL LOW (ref 3.5–5.1)
Sodium: 140 mmol/L (ref 135–145)

## 2019-12-29 LAB — BLOOD GAS, ARTERIAL
Acid-base deficit: 2.7 mmol/L — ABNORMAL HIGH (ref 0.0–2.0)
Bicarbonate: 20.9 mmol/L (ref 20.0–28.0)
FIO2: 21
O2 Saturation: 95.8 %
Patient temperature: 98.6
pCO2 arterial: 33.3 mmHg (ref 32.0–48.0)
pH, Arterial: 7.413 (ref 7.350–7.450)
pO2, Arterial: 79.5 mmHg — ABNORMAL LOW (ref 83.0–108.0)

## 2019-12-29 LAB — AMMONIA: Ammonia: 54 umol/L — ABNORMAL HIGH (ref 9–35)

## 2019-12-29 NOTE — Progress Notes (Signed)
PROGRESS NOTE  Connie Miranda [id number] DOB: [birth year] DOA: 12/24/2019 PCP: [name], MD   LOS: 5 days   Brief Narrative / Interim history: 85 year old female with prior CVA, HTN, chronic anemia, G6PD deficiency, recent vulval abscess, dysphagia who was brought in from home where she has been bedbound for almost past 5 years, with worsening confusion and suspected hallucinations.  She was diagnosed with cellulitis of the vulva and has been on Keflex for the last week.  She was found to be septic in the ER.  There was concern for endometritis and OB/GYN was consulted.  Subjective / 24h Interval events: -No events overnight, some periods of drowsiness but otherwise alert and able to have conversation, went to [facility] for an MRI this morning  Assessment & Plan:  Sepsis with concern for cellulitis as well as endometritis /old IUD/thickened endometrium -OB/GYN consulted, appreciate input.  Plans are in place for patient to eventually go to the OR for potentially IUD removal/endometrial biopsy, however on 12/29 patient became more somnolent, encephalopathic, surgery deferred temporal -She was initially placed on broad-spectrum antibiotics with Vanco, cefepime, Flagyl, MRSA PCR was negative and Vanco was discontinued.  Cefepime was switched to Ceftriaxone when she became more altered -Sepsis physiology improved, leukocytosis resolved   Acute kidney injury -Likely in the setting of sepsis, creatinine normalized with fluids  Intermittent encephalopathy -Admitted with encephalopathy 12/26 AM, in the setting of sepsis and fever, had since improved, 12/29 onwards was noted to be more confused -CT head was negative, MRI brain also unremarkable -Patient is somnolent but easily arousable, exam is nonfocal she has mild left hemiplegia from a prior CVA which is unchanged -Check ABG to rule out CO2 narcosis, given body habitus, untreated OSA could be contributing, also check ammonia level     -Cefepime was changed to ceftriaxone and Flagyl in case this was contributing  Hypertension -Continue home amlodipine, blood pressure stable  Left lower extremity cellulitis -Antibiotics as per #1, resolved  Chronic diastolic CHF -Continue home regimen, stable.   Prior CVA -Continue home Plavix.  She does not appear to be on a statin -MRI of the brain without acute findings  Constipation/fecal impaction -Resolved, has normal bowel movements now  DVT prophylaxis: Heparin Code Status: Full code Family Communication: Discussed with patient and updated daughter over the phone 12/31 Disposition Plan: Home when ready   Scheduled Meds: . amLODipine  10 mg Oral Daily  . calcium-vitamin D   Oral Daily  . clopidogrel  75 mg Oral Daily  . cyanocobalamin  1,000 mcg Intramuscular Q30 days  . heparin  5,000 Units Subcutaneous Q8H  . hydrocerin   Topical Daily  . isosorbide mononitrate  120 mg Oral Daily  . multivitamin with minerals  1 tablet Oral Daily  . polyethylene glycol  17 g Oral BID  . senna-docusate  2 tablet Oral QHS   Continuous Infusions: . cefTRIAXone (ROCEPHIN)  IV 2 g (12/28/19 1743)  . metronidazole 500 mg (12/29/19 1331)   PRN Meds:.acetaminophen, acetaminophen, polyethylene glycol  Consultants:  OB/GYN PCCM  Procedures:  None   Microbiology  Urine culture 12/26-no growth, final Blood cultures 12/26-no growth, pending SARS-CoV-2 12/26-negative  Antimicrobials: Vancomycin 12/26 Cefepime 12/26 Metronidazole 12/26   Objective: Vitals:   12/28/19 2100 12/29/19 0608 12/29/19 1230 12/29/19 1344  BP: (!) 148/64 (!) 154/59 133/66 (!) 131/53  Pulse: 87 83 78 78  Resp: 16 17 16 15   Temp: 97.8 F (36.6 C) 98.4 F (36.9 C) 97.9 F (36.6  C) 98.4 F (36.9 C)  TempSrc: Oral Oral  Oral  SpO2: 95% 99% 99% 98%  Weight:      Height:        Intake/Output Summary (Last 24 hours) at 12/29/2019 1500 Last data filed at 12/28/2019 2350 Gross per 24 hour   Intake 250 ml  Output 600 ml  Net -350 ml   Filed Weights   12/24/19 1120 12/25/19 0509 12/26/19 2000  Weight: 124.7 kg 110 kg 110 kg    Examination: Gen: Obese chronically ill elderly female, somnolent but easily arousable, oriented to self and place HEENT: Pupils equal reactive, edentulous Lungs: Breath sounds bases CVS: RRR,No Gallops,Rubs or new Murmurs Abd: Soft obese, nontender, nondistended, bowel sounds present Extremities: Chronic venous stasis with changes of bullous pemphigoid with multiple areas with peeled off skin  skin: As above  Data Reviewed: I have independently reviewed following labs and imaging studies   CBC: Recent Labs  Lab 12/24/19 1030 12/25/19 0617 12/26/19 0534 12/28/19 0628 12/29/19 0602  WBC 17.8* 15.7* 11.9* 9.1 7.8  NEUTROABS 16.4*  --   --   --   --   HGB 11.6* 10.2* 9.8* 9.0* 8.5*  HCT 38.2 35.7* 32.6* 29.2* 27.4*  MCV 96.7 102.9* 99.7 93.0 94.8  PLT 374 317 267 317 [id number]   Basic Metabolic Panel: Recent Labs  Lab 12/24/19 1030 12/25/19 0617 12/26/19 0534 12/27/19 0538 12/28/19 0628 12/29/19 0602  NA 137 137 140  --  139 140  K 4.1 4.0 3.7  --  3.0* 3.1*  CL 104 110 112*  --  112* 111  CO2 20* 17* 21*  --  20* 20*  GLUCOSE 138* 104* 99  --  94 106*  BUN 19 27* 30*  --  19 17  CREATININE 1.75* 1.62* 1.30* 1.15* 1.09* 0.92  CALCIUM 8.8* 8.2* 8.2*  --  8.3* 8.3*   Liver Function Tests: Recent Labs  Lab 12/24/19 1030 12/25/19 0617 12/26/19 0534  AST 78* 46* 43*  ALT 30 27 26   ALKPHOS 96 67 65  BILITOT 1.4* 0.9 1.1  PROT 7.9 6.9 6.3*  ALBUMIN 2.8* 2.4* 2.2*   Coagulation Profile: Recent Labs  Lab 12/24/19 1030 12/25/19 0617 12/28/19 0628  INR 1.3* 1.3* 1.3*   HbA1C: No results for input(s): HGBA1C in the last 72 hours. CBG: No results for input(s): GLUCAP in the last 168 hours.  Recent Results (from the past 240 hour(s))  Culture, blood (Routine x 2)     Status: None   Collection Time: 12/24/19 10:30 AM    Specimen: BLOOD  Result Value Ref Range Status   Specimen Description   Final    BLOOD RIGHT ANTECUBITAL Performed at [facility], [place] [address] [place], [place] [address]    Special Requests   Final    BOTTLES DRAWN AEROBIC AND ANAEROBIC Blood Culture adequate volume Performed at [place] [address], [place], [place] [address]    Culture   Final    NO GROWTH 5 DAYS Performed at [facility], [place] [address] [place], [place] [address]    Report Status 12/29/2019 FINAL  Final  Culture, blood (Routine x 2)     Status: None   Collection Time: 12/24/19 10:35 AM   Specimen: BLOOD LEFT HAND  Result Value Ref Range Status   Specimen Description   Final    BLOOD LEFT HAND Performed at [facility], [place] [address], [place], [place]  [address]    Special Requests   Final    BOTTLES DRAWN AEROBIC AND ANAEROBIC Blood Culture results may not be optimal due to an inadequate volume of blood received in culture bottles Performed at [facility], [place] [address] [place], [place] [address]    Culture   Final    NO GROWTH 5 DAYS Performed at [facility], [place] [address] [place], [place] [address]    Report Status 12/29/2019 FINAL  Final  Urine culture     Status: None   Collection Time: 12/24/19 10:39 AM   Specimen: In/Out Cath Urine  Result Value Ref Range Status   Specimen Description   Final    IN/OUT CATH URINE Performed at [place] [address], [place], [place] [address]    Special Requests   Final    NONE Performed at [facility], [place] [address], [place], [place] [address]    Culture   Final    NO GROWTH Performed at [facility], [place] [address] [place], [place] [address]    Report Status 12/25/2019 FINAL  Final  SARS CORONAVIRUS 2 (TAT 6-24 HRS) Nasopharyngeal Nasopharyngeal Swab     Status: None   Collection Time: 12/24/19  1:32 PM   Specimen:  Nasopharyngeal Swab  Result Value Ref Range Status   SARS Coronavirus 2 NEGATIVE NEGATIVE Final    Comment: (NOTE) SARS-CoV-2 target nucleic acids are NOT DETECTED. The SARS-CoV-2 RNA is generally detectable in upper and lower respiratory specimens during the acute phase of infection. Negative results do not preclude SARS-CoV-2 infection, do not rule out co-infections with other pathogens, and should not be used as the sole basis for treatment or other patient management decisions. Negative results must be combined with clinical observations, patient history, and epidemiological information. The expected result is Negative. Fact Sheet for Patients: SugarRoll.be Fact Sheet for Healthcare Providers: https://www.woods-mathews.com/ This test is not yet approved or cleared by the [place] FDA and  has been authorized for detection and/or diagnosis of SARS-CoV-2 by FDA under an Emergency Use Authorization (EUA). This EUA will remain  in effect (meaning this test can be used) for the duration of the COVID-19 declaration under Section 56 4(b)(1) of the Act, 21 U.S.C. section 360bbb-3(b)(1), unless the authorization is terminated or revoked sooner. Performed at [facility], [place] [address] [place], [place] [address]   MRSA PCR Screening     Status: None   Collection Time: 12/25/19  7:04 PM   Specimen: Nasal Mucosa; Nasopharyngeal  Result Value Ref Range Status   MRSA by PCR NEGATIVE NEGATIVE Final    Comment:        The GeneXpert MRSA Assay (FDA approved for NASAL specimens only), is one component of a comprehensive MRSA colonization surveillance program. It is not intended to diagnose MRSA infection nor to guide or monitor treatment for MRSA infections. Performed at [facility], [place] [address] [place],  [address]      Radiology Studies: MR BRAIN WO CONTRAST  Result Date: 12/29/2019 CLINICAL DATA:   Sudden onset encephalopathy EXAM: MRI HEAD WITHOUT CONTRAST TECHNIQUE: Multiplanar, multiecho pulse sequences of the brain and surrounding structures were obtained without intravenous contrast. COMPARISON:  MRI head 04/10/2013 FINDINGS: Brain: Moderate atrophy and ventricular enlargement. Mild chronic white matter changes similar to the prior study. No acute infarct, hemorrhage, mass. No fluid collection or midline shift. Vascular: Normal arterial flow voids. Skull and upper cervical  spine: Negative Sinuses/Orbits: Mild mucosal edema paranasal sinuses. No orbital mass. Other: None IMPRESSION: Moderate atrophy with mild chronic microvascular ischemic change in the white matter No acute intracranial abnormality. Electronically Signed   By: Franchot Gallo M.D.   On: 12/29/2019 14:12    Domenic Polite, MD [facility]

## 2019-12-30 LAB — COMPREHENSIVE METABOLIC PANEL
ALT: 47 U/L — ABNORMAL HIGH (ref 0–44)
AST: 62 U/L — ABNORMAL HIGH (ref 15–41)
Albumin: 2.7 g/dL — ABNORMAL LOW (ref 3.5–5.0)
Alkaline Phosphatase: 119 U/L (ref 38–126)
Anion gap: 9 (ref 5–15)
BUN: 12 mg/dL (ref 8–23)
CO2: 21 mmol/L — ABNORMAL LOW (ref 22–32)
Calcium: 8.7 mg/dL — ABNORMAL LOW (ref 8.9–10.3)
Chloride: 111 mmol/L (ref 98–111)
Creatinine, Ser: 0.94 mg/dL (ref 0.44–1.00)
GFR calc Af Amer: >60 mL/min (ref >60)
GFR calc non Af Amer: 56 mL/min — ABNORMAL LOW (ref >60)
Glucose, Bld: 108 mg/dL — ABNORMAL HIGH (ref 70–99)
Potassium: 3.5 mmol/L (ref 3.5–5.1)
Sodium: 141 mmol/L (ref 135–145)
Total Bilirubin: 0.8 mg/dL (ref 0.3–1.2)
Total Protein: 7.2 g/dL (ref 6.5–8.1)

## 2019-12-30 LAB — AMMONIA: Ammonia: 36 umol/L — ABNORMAL HIGH (ref 9–35)

## 2019-12-30 LAB — CBC
HCT: 31.8 % — ABNORMAL LOW (ref 36.0–46.0)
Hemoglobin: 9.8 g/dL — ABNORMAL LOW (ref 12.0–15.0)
MCH: 29.3 pg (ref 26.0–34.0)
MCHC: 30.8 g/dL (ref 30.0–36.0)
MCV: 95.2 fL (ref 80.0–100.0)
Platelets: 474 10*3/uL — ABNORMAL HIGH (ref 150–400)
RBC: 3.34 MIL/uL — ABNORMAL LOW (ref 3.87–5.11)
RDW: 15.2 % (ref 11.5–15.5)
WBC: 9 10*3/uL (ref 4.0–10.5)
nRBC: 0 % (ref 0.0–0.2)

## 2019-12-30 NOTE — Progress Notes (Signed)
PROGRESS NOTE  Connie Miranda [id number] DOB: Jun 18, 1933 DOA: 12/24/2019 PCP: [name], MD   LOS: 6 days   Brief Narrative / Interim history: 86 year old female with prior CVA, HTN, chronic anemia, G6PD deficiency, recent vulval abscess, dysphagia who was brought in from home where she has been bedbound for almost past 5 years, with worsening confusion and suspected hallucinations.  She was diagnosed with cellulitis of the vulva and has been on Keflex for the last week.  She was found to be septic in the ER.  There was concern for endometritis and OB/GYN was consulted.  Subjective / 24h Interval events: -No events overnight, some periods of drowsiness but otherwise alert and able to have conversation, went to [facility] for an MRI this morning  Assessment & Plan:  Sepsis with concern for cellulitis as well as endometritis /old IUD/thickened endometrium -OB/GYN consulted, appreciate input.  Plans are in place for patient to eventually go to the OR for potentially IUD removal/endometrial biopsy, however on 12/29 patient became more somnolent, encephalopathic, surgery deferred temporal -She was initially placed on broad-spectrum antibiotics with Vanco, cefepime, Flagyl, MRSA PCR was negative and Vanco was discontinued.  Cefepime was switched to Ceftriaxone when she became more altered -Sepsis physiology improved, leukocytosis resolved   Acute kidney injury -Likely in the setting of sepsis, creatinine normalized with fluids  Intermittent encephalopathy -Admitted with encephalopathy 12/26 AM, in the setting of sepsis and fever, had since improved, 12/29 onwards was noted to be more confused -CT head was negative, MRI brain also unremarkable -Patient is somnolent but easily arousable, exam is nonfocal she has mild left hemiplegia from a prior CVA which is unchanged -Check ABG to rule out CO2 narcosis, given body habitus, untreated OSA could be contributing, also check ammonia level    -Cefepime was changed to ceftriaxone and Flagyl in case this was contributing  Hypertension -Continue home amlodipine, blood pressure stable  Left lower extremity cellulitis -Antibiotics as per #1, resolved  Chronic diastolic CHF -Continue home regimen, stable.   Prior CVA -Continue home Plavix.  She does not appear to be on a statin -MRI of the brain without acute findings  Constipation/fecal impaction -Resolved, has normal bowel movements now  DVT prophylaxis: Heparin Code Status: Full code Family Communication: Discussed with patient and updated daughter over the phone 12/31 Disposition Plan: Home when ready   Scheduled Meds: . amLODipine  10 mg Oral Daily  . calcium-vitamin D   Oral Daily  . clopidogrel  75 mg Oral Daily  . cyanocobalamin  1,000 mcg Intramuscular Q30 days  . heparin  5,000 Units Subcutaneous Q8H  . hydrocerin   Topical Daily  . isosorbide mononitrate  120 mg Oral Daily  . multivitamin with minerals  1 tablet Oral Daily  . polyethylene glycol  17 g Oral BID  . senna-docusate  2 tablet Oral QHS   Continuous Infusions: . cefTRIAXone (ROCEPHIN)  IV 2 g (12/29/19 1622)  . metronidazole 500 mg (12/30/19 1404)   PRN Meds:.acetaminophen, acetaminophen, polyethylene glycol  Consultants:  OB/GYN PCCM  Procedures:  None   Microbiology  Urine culture 12/26-no growth, final Blood cultures 12/26-no growth, pending SARS-CoV-2 12/26-negative  Antimicrobials: Vancomycin 12/26 Cefepime 12/26 Metronidazole 12/26   Objective: Vitals:   12/29/19 1230 12/29/19 1344 12/29/19 2204 12/30/19 0457  BP: 133/66 (!) 131/53 (!) 128/47 (!) 147/66  Pulse: 78 78 82 77  Resp: 16 15 16 16   Temp: 97.9 F (36.6 C) 98.4 F (36.9 C) 98 F (36.7 C)  98.1 F (36.7 C)  TempSrc:  Oral    SpO2: 99% 98% 98% 97%  Weight:      Height:        Intake/Output Summary (Last 24 hours) at 12/30/2019 1455 Last data filed at 12/30/2019 0830 Gross per 24 hour  Intake 400 ml   Output -  Net 400 ml   Filed Weights   12/24/19 1120 12/25/19 0509 12/26/19 2000  Weight: 124.7 kg 110 kg 110 kg    Examination: Gen: Obese chronically ill elderly female, somnolent but easily arousable, oriented to self and place HEENT: Pupils equal reactive, edentulous Lungs: Breath sounds bases CVS: RRR,No Gallops,Rubs or new Murmurs Abd: Soft obese, nontender, nondistended, bowel sounds present Extremities: Chronic venous stasis with changes of bullous pemphigoid with multiple areas with peeled off skin  skin: As above  Data Reviewed: I have independently reviewed following labs and imaging studies   CBC: Recent Labs  Lab 12/24/19 1030 12/25/19 0617 12/26/19 0534 12/28/19 0628 12/29/19 0602 12/30/19 0536  WBC 17.8* 15.7* 11.9* 9.1 7.8 9.0  NEUTROABS 16.4*  --   --   --   --   --   HGB 11.6* 10.2* 9.8* 9.0* 8.5* 9.8*  HCT 38.2 35.7* 32.6* 29.2* 27.4* 31.8*  MCV 96.7 102.9* 99.7 93.0 94.8 95.2  PLT 374 317 267 317 350 [id number]*   Basic Metabolic Panel: Recent Labs  Lab 12/25/19 0617 12/26/19 0534 12/27/19 0538 12/28/19 0628 12/29/19 0602 12/30/19 0536  NA 137 140  --  139 140 141  K 4.0 3.7  --  3.0* 3.1* 3.5  CL 110 112*  --  112* 111 111  CO2 17* 21*  --  20* 20* 21*  GLUCOSE 104* 99  --  94 106* 108*  BUN 27* 30*  --  19 17 12   CREATININE 1.62* 1.30* 1.15* 1.09* 0.92 0.94  CALCIUM 8.2* 8.2*  --  8.3* 8.3* 8.7*   Liver Function Tests: Recent Labs  Lab 12/24/19 1030 12/25/19 0617 12/26/19 0534 12/30/19 0536  AST 78* 46* 43* 62*  ALT 30 27 26  47*  ALKPHOS 96 67 65 119  BILITOT 1.4* 0.9 1.1 0.8  PROT 7.9 6.9 6.3* 7.2  ALBUMIN 2.8* 2.4* 2.2* 2.7*   Coagulation Profile: Recent Labs  Lab 12/24/19 1030 12/25/19 0617 12/28/19 0628  INR 1.3* 1.3* 1.3*   HbA1C: No results for input(s): HGBA1C in the last 72 hours. CBG: No results for input(s): GLUCAP in the last 168 hours.  Recent Results (from the past 240 hour(s))  Culture, blood (Routine x 2)      Status: None   Collection Time: 12/24/19 10:30 AM   Specimen: BLOOD  Result Value Ref Range Status   Specimen Description   Final    BLOOD RIGHT ANTECUBITAL Performed at [facility], [place] [address] [place], [place] [address]    Special Requests   Final    BOTTLES DRAWN AEROBIC AND ANAEROBIC Blood Culture adequate volume Performed at [place] [address] [place], [place] [address]    Culture   Final    NO GROWTH 5 DAYS Performed at [facility], [place] [address], [place], [place] [address]    Report Status 12/29/2019 FINAL  Final  Culture, blood (Routine x 2)     Status: None   Collection Time: 12/24/19 10:35 AM   Specimen: BLOOD LEFT HAND  Result Value Ref Range Status   Specimen Description   Final  BLOOD LEFT HAND Performed at [facility], [place] [address] [place], [place] [address]    Special Requests   Final    BOTTLES DRAWN AEROBIC AND ANAEROBIC Blood Culture results may not be optimal due to an inadequate volume of blood received in culture bottles Performed at [place] [facility] [address], [place], [place] [address]    Culture   Final    NO GROWTH 5 DAYS Performed at [facility], [place] [address] [place], [place] [address]    Report Status 12/29/2019 FINAL  Final  Urine culture     Status: None   Collection Time: 12/24/19 10:39 AM   Specimen: In/Out Cath Urine  Result Value Ref Range Status   Specimen Description   Final    IN/OUT CATH URINE Performed at [facility] [address], [place], [place] [address]    Special Requests   Final    NONE Performed at [facility], [place] [address], [place], [place] [address]    Culture   Final    NO GROWTH Performed at [facility], [place] [address], [place], [place] [address]    Report Status 12/25/2019 FINAL  Final  SARS CORONAVIRUS 2 (TAT 6-24 HRS) Nasopharyngeal Nasopharyngeal Swab     Status: None    Collection Time: 12/24/19  1:32 PM   Specimen: Nasopharyngeal Swab  Result Value Ref Range Status   SARS Coronavirus 2 NEGATIVE NEGATIVE Final    Comment: (NOTE) SARS-CoV-2 target nucleic acids are NOT DETECTED. The SARS-CoV-2 RNA is generally detectable in upper and lower respiratory specimens during the acute phase of infection. Negative results do not preclude SARS-CoV-2 infection, do not rule out co-infections with other pathogens, and should not be used as the sole basis for treatment or other patient management decisions. Negative results must be combined with clinical observations, patient history, and epidemiological information. The expected result is Negative. Fact Sheet for Patients: SugarRoll.be Fact Sheet for Healthcare Providers: https://www.woods-mathews.com/ This test is not yet approved or cleared by the [place] FDA and  has been authorized for detection and/or diagnosis of SARS-CoV-2 by FDA under an Emergency Use Authorization (EUA). This EUA will remain  in effect (meaning this test can be used) for the duration of the COVID-19 declaration under Section 56 4(b)(1) of the Act, 21 U.S.C. section 360bbb-3(b)(1), unless the authorization is terminated or revoked sooner. Performed at [facility], [place] [address] [place], [place] [address]   MRSA PCR Screening     Status: None   Collection Time: 12/25/19  7:04 PM   Specimen: Nasal Mucosa; Nasopharyngeal  Result Value Ref Range Status   MRSA by PCR NEGATIVE NEGATIVE Final    Comment:        The GeneXpert MRSA Assay (FDA approved for NASAL specimens only), is one component of a comprehensive MRSA colonization surveillance program. It is not intended to diagnose MRSA infection nor to guide or monitor treatment for MRSA infections. Performed at [facility], [place] [address], [place],  [address]      Radiology Studies: No results  found.  Domenic Polite, MD [facility]   PROGRESS NOTE  Connie Miranda [id number] DOB: 02-05-1933 DOA: 12/24/2019 PCP: [name], MD   LOS: 6 days   Brief Narrative / Interim history: 86 year old female with prior CVA, HTN, chronic anemia, G6PD deficiency, recent vulval abscess, dysphagia who was brought in from home where she has been  bedbound for almost past 5 years, with worsening confusion and suspected hallucinations.  She was diagnosed with cellulitis of the vulva and has been on Keflex for the last week.  She was found to be septic in the ER.  There was concern for endometritis and OB/GYN was consulted.  Subjective / 24h Interval events: -No events overnight, some periods of drowsiness but otherwise alert and able to have conversation, went to [facility] for an MRI this morning  Assessment & Plan:  Sepsis with concern for cellulitis as well as endometritis /old IUD/thickened endometrium -OB/GYN consulted, appreciate input.   -Plans were for patient to eventually go to the OR for IUD removal/endometrial biopsy, however on 12/29 patient became more somnolent, encephalopathic, surgery deferred  -She was initially placed on broad-spectrum antibiotics with Vanco, cefepime, Flagyl, MRSA PCR was negative and Vanco was discontinued.  Cefepime was switched to Ceftriaxone when she became more altered -Sepsis physiology improved, leukocytosis resolved  -Given improvement in mental status will discuss D&C, IUD removal with GYN  Acute kidney injury -Likely in the setting of sepsis, creatinine normalized with fluids  Metabolic encephalopathy -Admitted with encephalopathy 12/26 AM, in the setting of sepsis and fever, had since improved, 12/29 onwards was noted to be more confused -CT head was negative, MRI brain also unremarkable -Status has improved, she is back to baseline, AAO x3, she has mild residual left hemiplegia from a prior stroke -ABG negative for CO2 retention  -Cefepime was changed to ceftriaxone and Flagyl in case this was contributing -Resolved  Hypertension -Resume amlodipine  Left lower extremity cellulitis -Antibiotics as per #1, resolved  Chronic diastolic CHF -Continue home regimen, stable.   Prior CVA -Continue home Plavix.  She does not appear to be on a statin -MRI of the brain without acute findings  Constipation/fecal impaction -Resolved, has normal bowel movements now  Bullous pemphigoid -Currently not on steroids or immunomodulators, follow-up with dermatology  DVT prophylaxis: Heparin Code Status: Full code Family Communication: No family no family at bedside, called and updated daughter yesterday 12/31 Disposition Plan: Home when ready   Scheduled Meds: . amLODipine  10 mg Oral Daily  . calcium-vitamin D   Oral Daily  . clopidogrel  75 mg Oral Daily  . cyanocobalamin  1,000 mcg Intramuscular Q30 days  . heparin  5,000 Units Subcutaneous Q8H  . hydrocerin   Topical Daily  . isosorbide mononitrate  120 mg Oral Daily  . multivitamin with minerals  1 tablet Oral Daily  . polyethylene glycol  17 g Oral BID  . senna-docusate  2 tablet Oral QHS   Continuous Infusions: . cefTRIAXone (ROCEPHIN)  IV 2 g (12/29/19 1622)  . metronidazole 500 mg (12/30/19 1404)   PRN Meds:.acetaminophen, acetaminophen, polyethylene glycol  Consultants:  OB/GYN PCCM  Procedures:  None   Microbiology  Urine culture 12/26-no growth, final Blood cultures 12/26-no growth, pending SARS-CoV-2 12/26-negative  Antimicrobials: Vancomycin 12/26 Cefepime 12/26 Metronidazole 12/26   Objective: Vitals:   12/29/19 1230 12/29/19 1344 12/29/19 2204 12/30/19 0457  BP: 133/66 (!) 131/53 (!) 128/47 (!) 147/66  Pulse: 78 78 82 77  Resp: 16 15 16 16   Temp: 97.9 F (36.6 C) 98.4 F (36.9 C) 98 F (36.7 C) 98.1 F (36.7 C)  TempSrc:  Oral    SpO2: 99% 98% 98% 97%  Weight:      Height:        Intake/Output Summary (Last 24 hours)  at 12/30/2019 1455 Last data filed at 12/30/2019 0830 Gross  per 24 hour  Intake 400 ml  Output -  Net 400 ml   Filed Weights   12/24/19 1120 12/25/19 0509 12/26/19 2000  Weight: 124.7 kg 110 kg 110 kg   Examination: Gen: Obese chronically ill elderly female, awake interactive and oriented x3 this morning HEENT: Neck obese, oral mucosa is moist Lungs: Poor air movement otherwise clear CVS: RRR,No Gallops,Rubs or new Murmurs Abd: soft, Non tender, non distended, BS present Extremities: Chronic venous stasis changes and extensive ulcers from blistered, peeled off superficial layers of skin, bullous pemphigoid Skin: As above  Data Reviewed: I have independently reviewed following labs and imaging studies   CBC: Recent Labs  Lab 12/24/19 1030 12/25/19 0617 12/26/19 0534 12/28/19 0628 12/29/19 0602 12/30/19 0536  WBC 17.8* 15.7* 11.9* 9.1 7.8 9.0  NEUTROABS 16.4*  --   --   --   --   --   HGB 11.6* 10.2* 9.8* 9.0* 8.5* 9.8*  HCT 38.2 35.7* 32.6* 29.2* 27.4* 31.8*  MCV 96.7 102.9* 99.7 93.0 94.8 95.2  PLT 374 317 267 317 350 123XX123*   Basic Metabolic Panel: Recent Labs  Lab 12/25/19 0617 12/26/19 0534 12/27/19 0538 12/28/19 0628 12/29/19 0602 12/30/19 0536  NA 137 140  --  139 140 141  K 4.0 3.7  --  3.0* 3.1* 3.5  CL 110 112*  --  112* 111 111  CO2 17* 21*  --  20* 20* 21*  GLUCOSE 104* 99  --  94 106* 108*  BUN 27* 30*  --  19 17 12   CREATININE 1.62* 1.30* 1.15* 1.09* 0.92 0.94  CALCIUM 8.2* 8.2*  --  8.3* 8.3* 8.7*   Liver Function Tests: Recent Labs  Lab 12/24/19 1030 12/25/19 0617 12/26/19 0534 12/30/19 0536  AST 78* 46* 43* 62*  ALT 30 27 26  47*  ALKPHOS 96 67 65 119  BILITOT 1.4* 0.9 1.1 0.8  PROT 7.9 6.9 6.3* 7.2  ALBUMIN 2.8* 2.4* 2.2* 2.7*   Coagulation Profile: Recent Labs  Lab 12/24/19 1030 12/25/19 0617 12/28/19 0628  INR 1.3* 1.3* 1.3*   HbA1C: No results for input(s): HGBA1C in the last 72 hours. CBG: No results for input(s): GLUCAP in  the last 168 hours.  Recent Results (from the past 240 hour(s))  Culture, blood (Routine x 2)     Status: None   Collection Time: 12/24/19 10:30 AM   Specimen: BLOOD  Result Value Ref Range Status   Specimen Description   Final    BLOOD RIGHT ANTECUBITAL Performed at [facility], [place] [address], [place], [place] [address]    Special Requests   Final    BOTTLES DRAWN AEROBIC AND ANAEROBIC Blood Culture adequate volume Performed at [facility] [address], [place], [place] [address]    Culture   Final    NO GROWTH 5 DAYS Performed at [facility], [place] [address], [place], [place] [address]    Report Status 12/29/2019 FINAL  Final  Culture, blood (Routine x 2)     Status: None   Collection Time: 12/24/19 10:35 AM   Specimen: BLOOD LEFT HAND  Result Value Ref Range Status   Specimen Description   Final    BLOOD LEFT HAND Performed at [facility], [place] [address] [place], [place] [address]    Special Requests   Final    BOTTLES DRAWN AEROBIC AND ANAEROBIC Blood Culture results may not be optimal due to an inadequate volume  of blood received in culture bottles Performed at [facility], [place] [address] [place], [place] [address]    Culture   Final    NO GROWTH 5 DAYS Performed at [facility], [place] [address], [place], [place] [address]    Report Status 12/29/2019 FINAL  Final  Urine culture     Status: None   Collection Time: 12/24/19 10:39 AM   Specimen: In/Out Cath Urine  Result Value Ref Range Status   Specimen Description   Final    IN/OUT CATH URINE Performed at [facility] [address] [place], [place] [address]    Special Requests   Final    NONE Performed at [facility], [place] [address] [place], [place] [address]    Culture   Final    NO GROWTH Performed at [facility], [place] [address], [place], [place] [address]    Report Status 12/25/2019  FINAL  Final  SARS CORONAVIRUS 2 (TAT 6-24 HRS) Nasopharyngeal Nasopharyngeal Swab     Status: None   Collection Time: 12/24/19  1:32 PM   Specimen: Nasopharyngeal Swab  Result Value Ref Range Status   SARS Coronavirus 2 NEGATIVE NEGATIVE Final    Comment: (NOTE) SARS-CoV-2 target nucleic acids are NOT DETECTED. The SARS-CoV-2 RNA is generally detectable in upper and lower respiratory specimens during the acute phase of infection. Negative results do not preclude SARS-CoV-2 infection, do not rule out co-infections with other pathogens, and should not be used as the sole basis for treatment or other patient management decisions. Negative results must be combined with clinical observations, patient history, and epidemiological information. The expected result is Negative. Fact Sheet for Patients: SugarRoll.be Fact Sheet for Healthcare Providers: https://www.woods-mathews.com/ This test is not yet approved or cleared by the [place] FDA and  has been authorized for detection and/or diagnosis of SARS-CoV-2 by FDA under an Emergency Use Authorization (EUA). This EUA will remain  in effect (meaning this test can be used) for the duration of the COVID-19 declaration under Section 56 4(b)(1) of the Act, 21 U.S.C. section 360bbb-3(b)(1), unless the authorization is terminated or revoked sooner. Performed at [facility], [place] [address] [place], [place] [address]   MRSA PCR Screening     Status: None   Collection Time: 12/25/19  7:04 PM   Specimen: Nasal Mucosa; Nasopharyngeal  Result Value Ref Range Status   MRSA by PCR NEGATIVE NEGATIVE Final    Comment:        The GeneXpert MRSA Assay (FDA approved for NASAL specimens only), is one component of a comprehensive MRSA colonization surveillance program. It is not intended to diagnose MRSA infection nor to guide or monitor treatment for MRSA infections. Performed at [facility], [place] [address] [place], [place] [address]      Radiology Studies: No results found.  Domenic Polite, MD [facility]

## 2019-12-31 LAB — CREATININE, SERUM
Creatinine, Ser: 0.74 mg/dL (ref 0.44–1.00)
GFR calc Af Amer: >60 mL/min (ref >60)
GFR calc non Af Amer: >60 mL/min (ref >60)

## 2019-12-31 MED ORDER — SODIUM CHLORIDE 0.9 % IV SOLN
INTRAVENOUS | Status: DC | PRN
  Administered 2019-12-31 – 2020-01-03 (×2): 250 mL via INTRAVENOUS

## 2019-12-31 MED ORDER — HYPROMELLOSE (GONIOSCOPIC) 2.5 % OP SOLN: 1.0000 [drp] | OPHTHALMIC | Status: DC | PRN

## 2019-12-31 MED ORDER — POLYVINYL ALCOHOL 1.4 % OP SOLN
1.0000 [drp] | OPHTHALMIC | Status: DC | PRN
  Administered 2019-12-31 – 2020-01-03 (×4): 1 [drp] via OPHTHALMIC
  Filled 2019-12-31: qty 15

## 2019-12-31 NOTE — Progress Notes (Addendum)
PROGRESS NOTE  SHAKURA PONCEDELEON ZOX:[id number] DOB: 10/27/1933 DOA: 12/24/2019 PCP: [name], MD   LOS: 7 days   Brief Narrative / Interim history: 86 year old female with morbid obesity, prior CVA, HTN, chronic anemia, G6PD deficiency, recent vulval abscess, dysphagia who was brought in from home where she has been bedbound for almost past 5 years, she was diagnosed with cellulitis of the vulva and has been on Keflex for the last week.  She was was brought to the ED encephalopathic, found to be febrile to 104 with sepsis, imaging was concerning for endometritis and IUD -Gynecology was consulted, she slowly improved, plan was for [facility], removal of IUD however subsequently developed encephalopathy of unclear etiology with negative work-up which has since resolved. -AAO x4 now, gynecology reconsulted, plan for D&C/IUD removal early next week  Subjective / 24h Interval events: -no events overnight, alert and oriented, very talkative -Denies any abdominal discomfort, no nausea or vomiting, tolerating diet  Assessment & Plan:  Sepsis with concern for cellulitis as well as endometritis /old IUD/thickened endometrium -OB/GYN consulted, appreciate input.   -Plans were for patient to go to the OR for IUD removal/endometrial biopsy, however on 12/29 patient became more somnolent, encephalopathic, surgery deferred  -She was initially placed on broad-spectrum antibiotics with Vanco, cefepime, Flagyl, MRSA PCR was negative and Vanco was discontinued.  Cefepime was switched to Ceftriaxone when she became more altered -Sepsis physiology improved, leukocytosis resolved  -Given improvement in mental status, I called Dr.Davis-GYN yesterday and discussed D&C, IUD removal, they are planning to do this early next week-Monday or tuesday  Acute kidney injury -Likely in the setting of sepsis, creatinine normalized with fluids  Metabolic encephalopathy -Admitted with encephalopathy 12/26 AM, in the setting  of sepsis and fever, had since improved, 12/29 onwards was noted to be more confused -CT head was negative, MRI brain also unremarkable -Status has improved, she is back to baseline, AAO x3, she has mild residual left hemiplegia from a prior stroke -ABG negative for CO2 retention -Cefepime was changed to ceftriaxone and Flagyl in case this was contributing -Suspect hospital delirium -Resolved  Hypertension -Resume amlodipine  Left lower extremity cellulitis -Antibiotics as per #1, resolved  Chronic diastolic CHF -Continue home regimen, stable.   Prior CVA -Continue home Plavix.  She does not appear to be on a statin -MRI of the brain without acute findings  Constipation/fecal impaction -Resolved, has normal bowel movements now  Bullous pemphigoid -Currently not on steroids or immunomodulators, follow-up with dermatology  DVT prophylaxis: Heparin Code Status: Full code Family Communication: No family no family at bedside, called and updated daughter Delice Bison yesterday 12/31 and 1/2 Disposition Plan: Home when ready, likely 4-5days   Scheduled Meds: . amLODipine  10 mg Oral Daily  . calcium-vitamin D   Oral Daily  . clopidogrel  75 mg Oral Daily  . cyanocobalamin  1,000 mcg Intramuscular Q30 days  . heparin  5,000 Units Subcutaneous Q8H  . hydrocerin   Topical Daily  . isosorbide mononitrate  120 mg Oral Daily  . multivitamin with minerals  1 tablet Oral Daily  . polyethylene glycol  17 g Oral BID  . senna-docusate  2 tablet Oral QHS   Continuous Infusions: . sodium chloride 250 mL (12/31/19 0737)  . cefTRIAXone (ROCEPHIN)  IV 2 g (12/30/19 1700)  . metronidazole 500 mg (12/31/19 1324)   PRN Meds:.sodium chloride, acetaminophen, acetaminophen, polyethylene glycol, polyvinyl alcohol  Consultants:  OB/GYN PCCM  Procedures:  None   Microbiology  Urine culture 12/26-no growth, final Blood cultures 12/26-no growth, pending SARS-CoV-2  12/26-negative  Antimicrobials: Vancomycin 12/26 Cefepime 12/26 Metronidazole 12/26   Objective: Vitals:   12/30/19 1705 12/30/19 2116 12/31/19 0538 12/31/19 1338  BP: (!) 148/72 128/63 (!) 139/53 (!) 179/61  Pulse: 85 83 81 85  Resp: 18 17 17 16   Temp: (!) 97.5 F (36.4 C) 98.5 F (36.9 C) 98.6 F (37 C) 98.6 F (37 C)  TempSrc:  Oral Oral Oral  SpO2: 99% 98% 98% 100%  Weight:      Height:        Intake/Output Summary (Last 24 hours) at 12/31/2019 1339 Last data filed at 12/31/2019 1204 Gross per 24 hour  Intake 960 ml  Output --  Net 960 ml   Filed Weights   12/24/19 1120 12/25/19 0509 12/26/19 2000  Weight: 124.7 kg 110 kg 110 kg   Examination: Gen: Morbidly obese chronically ill elderly female, awake alert, oriented x4, HEENT: Obese, oral mucosa is moist and pink Lungs: Clear anteriorly CVS: RRR,No Gallops,Rubs or new Murmurs Abd: soft, Non tender, non distended, BS present Extremities: Chronic venous stasis changes and extensive ulcers from blistered, peeled off superficial layers of skin, bullous pemphigoid Skin: As above  Data Reviewed: I have independently reviewed following labs and imaging studies   CBC: Recent Labs  Lab 12/25/19 0617 12/26/19 0534 12/28/19 0628 12/29/19 0602 12/30/19 0536  WBC 15.7* 11.9* 9.1 7.8 9.0  HGB 10.2* 9.8* 9.0* 8.5* 9.8*  HCT 35.7* 32.6* 29.2* 27.4* 31.8*  MCV 102.9* 99.7 93.0 94.8 95.2  PLT 317 267 317 350 474*   Basic Metabolic Panel: Recent Labs  Lab 12/25/19 0617 12/26/19 0534 12/27/19 0538 12/28/19 0628 12/29/19 0602 12/30/19 0536 12/31/19 0505  NA 137 140  --  139 140 141  --   K 4.0 3.7  --  3.0* 3.1* 3.5  --   CL 110 112*  --  112* 111 111  --   CO2 17* 21*  --  20* 20* 21*  --   GLUCOSE 104* 99  --  94 106* 108*  --   BUN 27* 30*  --  19 17 12   --   CREATININE 1.62* 1.30* 1.15* 1.09* 0.92 0.94 0.74  CALCIUM 8.2* 8.2*  --  8.3* 8.3* 8.7*  --    Liver Function Tests: Recent Labs  Lab  12/25/19 0617 12/26/19 0534 12/30/19 0536  AST 46* 43* 62*  ALT 27 26 47*  ALKPHOS 67 65 119  BILITOT 0.9 1.1 0.8  PROT 6.9 6.3* 7.2  ALBUMIN 2.4* 2.2* 2.7*   Coagulation Profile: Recent Labs  Lab 12/25/19 0617 12/28/19 0628  INR 1.3* 1.3*   HbA1C: No results for input(s): HGBA1C in the last 72 hours. CBG: No results for input(s): GLUCAP in the last 168 hours.  Recent Results (from the past 240 hour(s))  Culture, blood (Routine x 2)     Status: None   Collection Time: 12/24/19 10:30 AM   Specimen: BLOOD  Result Value Ref Range Status   Specimen Description   Final    BLOOD RIGHT ANTECUBITAL Performed at [facility], [address], [place], [place] [address]    Special Requests   Final    BOTTLES DRAWN AEROBIC AND ANAEROBIC Blood Culture adequate volume Performed at [facility], [address] [place], [place] [address]    Culture   Final    NO GROWTH 5 DAYS Performed at [facility],  [address], [place], [place] [address]    Report Status 12/29/2019 FINAL  Final  Culture, blood (Routine x 2)     Status: None   Collection Time: 12/24/19 10:35 AM   Specimen: BLOOD LEFT HAND  Result Value Ref Range Status   Specimen Description   Final    BLOOD LEFT HAND Performed at [facility], [address] [place], [place] [address]    Special Requests   Final    BOTTLES DRAWN AEROBIC AND ANAEROBIC Blood Culture results may not be optimal due to an inadequate volume of blood received in culture bottles Performed at [facility] [address] [place], [place] [address]    Culture   Final    NO GROWTH 5 DAYS Performed at [facility], [address], [place], [place] [address]    Report Status 12/29/2019 FINAL  Final  Urine culture     Status: None   Collection Time: 12/24/19 10:39 AM   Specimen: In/Out Cath Urine  Result Value Ref Range Status   Specimen Description   Final    IN/OUT CATH  URINE Performed at [facility], [address], [place], [place] [address]    Special Requests   Final    NONE Performed at [facility], [address] [place], [place] [address]    Culture   Final    NO GROWTH Performed at [facility], [address], [place], [place] [address]    Report Status 12/25/2019 FINAL  Final  SARS CORONAVIRUS 2 (TAT 6-24 HRS) Nasopharyngeal Nasopharyngeal Swab     Status: None   Collection Time: 12/24/19  1:32 PM   Specimen: Nasopharyngeal Swab  Result Value Ref Range Status   SARS Coronavirus 2 NEGATIVE NEGATIVE Final    Comment: (NOTE) SARS-CoV-2 target nucleic acids are NOT DETECTED. The SARS-CoV-2 RNA is generally detectable in upper and lower respiratory specimens during the acute phase of infection. Negative results do not preclude SARS-CoV-2 infection, do not rule out co-infections with other pathogens, and should not be used as the sole basis for treatment or other patient management decisions. Negative results must be combined with clinical observations, patient history, and epidemiological information. The expected result is Negative. Fact Sheet for Patients: HairSlick.no Fact Sheet for Healthcare Providers: quierodirigir.com This test is not yet approved or cleared by the [place] FDA and  has been authorized for detection and/or diagnosis of SARS-CoV-2 by FDA under an Emergency Use Authorization (EUA). This EUA will remain  in effect (meaning this test can be used) for the duration of the COVID-19 declaration under Section 56 4(b)(1) of the Act, 21 U.S.C. section 360bbb-3(b)(1), unless the authorization is terminated or revoked sooner. Performed at [facility], [address] [place], [place] [address]   MRSA PCR Screening     Status: None   Collection Time: 12/25/19  7:04 PM   Specimen: Nasal Mucosa; Nasopharyngeal   Result Value Ref Range Status   MRSA by PCR NEGATIVE NEGATIVE Final    Comment:        The GeneXpert MRSA Assay (FDA approved for NASAL specimens only), is one component of a comprehensive MRSA colonization surveillance program. It is not intended to diagnose MRSA infection nor to guide or monitor treatment for MRSA infections. Performed at [facility], [address] [place], [place] [address]      Radiology Studies: No results found.  Pavlos Yon  Jomarie Longs, MD [facility]

## 2020-01-01 LAB — BASIC METABOLIC PANEL
Anion gap: 8 (ref 5–15)
BUN: 7 mg/dL — ABNORMAL LOW (ref 8–23)
CO2: 23 mmol/L (ref 22–32)
Calcium: 8.6 mg/dL — ABNORMAL LOW (ref 8.9–10.3)
Chloride: 111 mmol/L (ref 98–111)
Creatinine, Ser: 0.63 mg/dL (ref 0.44–1.00)
GFR calc Af Amer: >60 mL/min (ref >60)
GFR calc non Af Amer: >60 mL/min (ref >60)
Glucose, Bld: 113 mg/dL — ABNORMAL HIGH (ref 70–99)
Potassium: 3 mmol/L — ABNORMAL LOW (ref 3.5–5.1)
Sodium: 142 mmol/L (ref 135–145)

## 2020-01-01 LAB — CBC
HCT: 30.7 % — ABNORMAL LOW (ref 36.0–46.0)
Hemoglobin: 9.3 g/dL — ABNORMAL LOW (ref 12.0–15.0)
MCH: 29.3 pg (ref 26.0–34.0)
MCHC: 30.3 g/dL (ref 30.0–36.0)
MCV: 96.8 fL (ref 80.0–100.0)
Platelets: 498 10*3/uL — ABNORMAL HIGH (ref 150–400)
RBC: 3.17 MIL/uL — ABNORMAL LOW (ref 3.87–5.11)
RDW: 17.1 % — ABNORMAL HIGH (ref 11.5–15.5)
WBC: 8.1 10*3/uL (ref 4.0–10.5)
nRBC: 0 % (ref 0.0–0.2)

## 2020-01-01 MED ORDER — POTASSIUM CHLORIDE CRYS ER 20 MEQ PO TBCR
40.0000 meq | EXTENDED_RELEASE_TABLET | ORAL | Status: AC
  Administered 2020-01-01 (×2): 40 meq via ORAL
  Filled 2020-01-01 (×2): qty 2

## 2020-01-01 NOTE — Progress Notes (Addendum)
PROGRESS NOTE  Connie Miranda [id number] DOB: 11-10-1933 DOA: 12/24/2019 PCP: [name], MD   LOS: 8 days   Brief Narrative / Interim history: 86 year old female with morbid obesity, prior CVA, HTN, chronic anemia, G6PD deficiency, bilateral lower extremity lymphedema, pemphigus (according to patient ) recent vulval abscess, dysphagia who was brought in from home where she has been bedbound for almost past 5 years, she was diagnosed with cellulitis of the vulva and has been on Keflex for the last week.  She was was brought to the ED encephalopathic, found to be febrile to 104 with sepsis, imaging was concerning for endometritis and IUD -Gynecology was consulted, she slowly improved, plan was for [facility], removal of IUD however subsequently developed encephalopathy of unclear etiology with negative work-up which has since resolved. -AAO x4 now, gynecology reconsulted, plan for D&C/IUD removal early next week  Assessment & Plan:  Sepsis with concern for cellulitis as well as endometritis /old IUD/thickened endometrium -OB/GYN consulted, appreciate input.   -Plans were for patient to go to the OR for IUD removal/endometrial biopsy, however on 12/29 patient became more somnolent, encephalopathic, surgery deferred  -She was initially placed on broad-spectrum antibiotics with Vanco, cefepime, Flagyl, MRSA PCR was negative and Vanco was discontinued.  Cefepime was switched to Ceftriaxone when she became more altered -Sepsis physiology improved, leukocytosis resolved  -Given improvement in mental status, my colleague Dr. Broadus John called Dr.Davis-GYN on 12/30/2019 and discussed D&C, IUD removal, they are planning to do this early next week-Monday or Tuesday  Hypokalemia: Replaced today and recheck in the morning.  Acute kidney injury -Likely in the setting of sepsis, creatinine normalized with fluids  Metabolic encephalopathy -Admitted with encephalopathy 12/26 AM, in the setting of sepsis and  fever, had since improved, 12/29 onwards was noted to be more confused -CT head was negative, MRI brain also unremarkable -Status has improved, she is back to baseline, AAO x3, she has mild residual left hemiplegia from a prior stroke -ABG negative for CO2 retention -Cefepime was changed to ceftriaxone and Flagyl in case this was contributing -Suspect hospital delirium which has resolved now.  Hypertension Controlled.  Continue amlodipine.  Left lower extremity cellulitis -According to patient, she has history of pemphigus which was resolved several years ago and has reappeared again.  She is tender to touch on the left lower extremity with several hyper and hypopigmented skin.  Possible underlying cellulitis.  Antibiotics as per #1.   Chronic diastolic CHF -Continue home regimen, stable.   Prior CVA -Continue home Plavix.  She does not appear to be on a statin -MRI of the brain without acute findings  Constipation/fecal impaction -Resolved, has normal bowel movements now  Bullous pemphigoid -Currently not on steroids or immunomodulators, follow-up with dermatology  DVT prophylaxis: Heparin Code Status: Full code Family Communication: No family no family at bedside.  Discussed plan of care with the patient.  She is alert and oriented. Disposition Plan: Home when ready, likely 4-5days   Scheduled Meds: . amLODipine  10 mg Oral Daily  . calcium-vitamin D   Oral Daily  . clopidogrel  75 mg Oral Daily  . cyanocobalamin  1,000 mcg Intramuscular Q30 days  . heparin  5,000 Units Subcutaneous Q8H  . hydrocerin   Topical Daily  . isosorbide mononitrate  120 mg Oral Daily  . multivitamin with minerals  1 tablet Oral Daily  . polyethylene glycol  17 g Oral BID  . senna-docusate  2 tablet Oral QHS   Continuous Infusions: .  sodium chloride 250 mL (12/31/19 0737)  . cefTRIAXone (ROCEPHIN)  IV 2 g (12/31/19 1724)  . metronidazole 500 mg (01/01/20 0626)   PRN Meds:.sodium chloride,  acetaminophen, acetaminophen, polyethylene glycol, polyvinyl alcohol  Consultants:  OB/GYN PCCM  Procedures:  None   Microbiology  Urine culture 12/26-no growth, final Blood cultures 12/26-no growth, pending SARS-CoV-2 12/26-negative  Antimicrobials: Vancomycin 12/26 Cefepime 12/26 Metronidazole 12/26  Subjective / 24h Interval events: Patient seen and examined.  She is completely alert and oriented.  She has no complaints.  Objective: Vitals:   12/31/19 0538 12/31/19 1338 12/31/19 2054 01/01/20 0546  BP: (!) 139/53 (!) 179/61 (!) 147/62 118/65  Pulse: 81 85 87 86  Resp: 17 16 18 18   Temp: 98.6 F (37 C) 98.6 F (37 C) 98.4 F (36.9 C) 97.7 F (36.5 C)  TempSrc: Oral Oral Oral Oral  SpO2: 98% 100% 97% 99%  Weight:      Height:        Intake/Output Summary (Last 24 hours) at 01/01/2020 1254 Last data filed at 01/01/2020 0500 Gross per 24 hour  Intake 1090.95 ml  Output --  Net 1090.95 ml   Filed Weights   12/24/19 1120 12/25/19 0509 12/26/19 2000  Weight: 124.7 kg 110 kg 110 kg   Examination: General exam: Appears calm and comfortable, morbidly obese Respiratory system: Clear to auscultation. Respiratory effort normal. Cardiovascular system: S1 & S2 heard, RRR. No JVD, murmurs, rubs, gallops or clicks. No pedal edema. Gastrointestinal system: Abdomen is nondistended, soft and nontender. No organomegaly or masses felt. Normal bowel sounds heard. Central nervous system: Alert and oriented. No focal neurological deficits. Extremities: Symmetric 5 x 5 power. Skin: Several scabs with hyper and hypopigmented skin seen bilateral lower extremities.  Left lower extremity tender to touch and with mild erythema. Psychiatry: Judgement and insight appear normal. Mood & affect appropriate.   Data Reviewed: I have independently reviewed following labs and imaging studies   CBC: Recent Labs  Lab 12/26/19 0534 12/28/19 0628 12/29/19 0602 12/30/19 0536 01/01/20 0631   WBC 11.9* 9.1 7.8 9.0 8.1  HGB 9.8* 9.0* 8.5* 9.8* 9.3*  HCT 32.6* 29.2* 27.4* 31.8* 30.7*  MCV 99.7 93.0 94.8 95.2 96.8  PLT 267 317 350 474* [id number]*   Basic Metabolic Panel: Recent Labs  Lab 12/26/19 0534 12/28/19 0628 12/29/19 0602 12/30/19 0536 12/31/19 0505 01/01/20 0631  NA 140 139 140 141  --  142  K 3.7 3.0* 3.1* 3.5  --  3.0*  CL 112* 112* 111 111  --  111  CO2 21* 20* 20* 21*  --  23  GLUCOSE 99 94 106* 108*  --  113*  BUN 30* 19 17 12   --  7*  CREATININE 1.30* 1.09* 0.92 0.94 0.74 0.63  CALCIUM 8.2* 8.3* 8.3* 8.7*  --  8.6*   Liver Function Tests: Recent Labs  Lab 12/26/19 0534 12/30/19 0536  AST 43* 62*  ALT 26 47*  ALKPHOS 65 119  BILITOT 1.1 0.8  PROT 6.3* 7.2  ALBUMIN 2.2* 2.7*   Coagulation Profile: Recent Labs  Lab 12/28/19 0628  INR 1.3*   HbA1C: No results for input(s): HGBA1C in the last 72 hours. CBG: No results for input(s): GLUCAP in the last 168 hours.  Recent Results (from the past 240 hour(s))  Culture, blood (Routine x 2)     Status: None   Collection Time: 12/24/19 10:30 AM   Specimen: BLOOD  Result Value Ref Range Status   Specimen Description  Final    BLOOD RIGHT ANTECUBITAL Performed at [facility], [place] [address], [place], [place] [address]    Special Requests   Final    BOTTLES DRAWN AEROBIC AND ANAEROBIC Blood Culture adequate volume Performed at [place] [address], [place], [place] [address]    Culture   Final    NO GROWTH 5 DAYS Performed at [facility], [place] [address], [place], [place] [address]    Report Status 12/29/2019 FINAL  Final  Culture, blood (Routine x 2)     Status: None   Collection Time: 12/24/19 10:35 AM   Specimen: BLOOD LEFT HAND  Result Value Ref Range Status   Specimen Description   Final    BLOOD LEFT HAND Performed at [facility], [place] [address], [place], [place] [address]    Special Requests   Final    BOTTLES DRAWN AEROBIC AND  ANAEROBIC Blood Culture results may not be optimal due to an inadequate volume of blood received in culture bottles Performed at [place] [address], [place], [place] [address]    Culture   Final    NO GROWTH 5 DAYS Performed at [facility], [place] [address] [place], [place] [address]    Report Status 12/29/2019 FINAL  Final  Urine culture     Status: None   Collection Time: 12/24/19 10:39 AM   Specimen: In/Out Cath Urine  Result Value Ref Range Status   Specimen Description   Final    IN/OUT CATH URINE Performed at [place] [address], [place], [place] [address]    Special Requests   Final    NONE Performed at [facility], [place] [address] [place], [place] [address]    Culture   Final    NO GROWTH Performed at [facility], [place] [address], [place], [place] [address]    Report Status 12/25/2019 FINAL  Final  SARS CORONAVIRUS 2 (TAT 6-24 HRS) Nasopharyngeal Nasopharyngeal Swab     Status: None   Collection Time: 12/24/19  1:32 PM   Specimen: Nasopharyngeal Swab  Result Value Ref Range Status   SARS Coronavirus 2 NEGATIVE NEGATIVE Final    Comment: (NOTE) SARS-CoV-2 target nucleic acids are NOT DETECTED. The SARS-CoV-2 RNA is generally detectable in upper and lower respiratory specimens during the acute phase of infection. Negative results do not preclude SARS-CoV-2 infection, do not rule out co-infections with other pathogens, and should not be used as the sole basis for treatment or other patient management decisions. Negative results must be combined with clinical observations, patient history, and epidemiological information. The expected result is Negative. Fact Sheet for Patients: SugarRoll.be Fact Sheet for Healthcare Providers: https://www.woods-mathews.com/ This test is not yet approved or cleared by the [place] FDA and  has been  authorized for detection and/or diagnosis of SARS-CoV-2 by FDA under an Emergency Use Authorization (EUA). This EUA will remain  in effect (meaning this test can be used) for the duration of the COVID-19 declaration under Section 56 4(b)(1) of the Act, 21 U.S.C. section 360bbb-3(b)(1), unless the authorization is terminated or revoked sooner. Performed at [facility], [place] [address] [place], [place] [address]   MRSA PCR Screening     Status: None   Collection Time: 12/25/19  7:04 PM   Specimen: Nasal Mucosa; Nasopharyngeal  Result Value Ref Range Status   MRSA by PCR NEGATIVE NEGATIVE Final  Comment:        The GeneXpert MRSA Assay (FDA approved for NASAL specimens only), is one component of a comprehensive MRSA colonization surveillance program. It is not intended to diagnose MRSA infection nor to guide or monitor treatment for MRSA infections. Performed at [facility], [place] [address], [place], [place] [address]      Radiology Studies: No results found.  Total time spent 31 minutes Darliss Cheney, MD [facility]

## 2020-01-01 NOTE — Progress Notes (Signed)
OB/GYN Note  Spoke with patient and daughter via telephone this afternoon in preparation for [facility], IUD removal tomorrow. They are in agreement with proceeding. Pt sounds much more alert and awake, states she is not nearly as "sleepy" now as she was last week. She verbalizes understanding of plan and is ready to proceed.  I reiterated that our goal is to sample the lining of the uterus and to remove the IUD if possible. I reviewed the risks of D&C including infection, hemorrhage, damage to surrounding tissue and organs, possible failure of IUD removal, complications with anesthesia including difficulty of extubation, MI, stroke, VTE. They verbalize understanding and desires to proceed. Consent to be signed tomorrow. Answered all questions.   Feliz Beam, M.D. Attending Center for [employer] [facility])

## 2020-01-02 ENCOUNTER — Inpatient Hospital Stay (HOSPITAL_COMMUNITY): Payer: Medicare Other | Admitting: Anesthesiology

## 2020-01-02 ENCOUNTER — Encounter (HOSPITAL_COMMUNITY)
Admission: EM | Disposition: A | Discharge status: Home / Self Care | Payer: Self-pay | Source: Home / Self Care | Attending: Internal Medicine

## 2020-01-02 ENCOUNTER — Encounter (HOSPITAL_COMMUNITY): Payer: Self-pay | Admitting: Internal Medicine

## 2020-01-02 DIAGNOSIS — Z30432 Encounter for removal of intrauterine contraceptive device: Secondary | ICD-10-CM

## 2020-01-02 LAB — BASIC METABOLIC PANEL
Anion gap: 5 (ref 5–15)
BUN: 7 mg/dL — ABNORMAL LOW (ref 8–23)
CO2: 23 mmol/L (ref 22–32)
Calcium: 8.3 mg/dL — ABNORMAL LOW (ref 8.9–10.3)
Chloride: 113 mmol/L — ABNORMAL HIGH (ref 98–111)
Creatinine, Ser: 0.73 mg/dL (ref 0.44–1.00)
GFR calc Af Amer: >60 mL/min (ref >60)
GFR calc non Af Amer: >60 mL/min (ref >60)
Glucose, Bld: 106 mg/dL — ABNORMAL HIGH (ref 70–99)
Potassium: 3.1 mmol/L — ABNORMAL LOW (ref 3.5–5.1)
Sodium: 141 mmol/L (ref 135–145)

## 2020-01-02 LAB — CBC WITH DIFFERENTIAL/PLATELET
Abs Immature Granulocytes: 0.15 10*3/uL — ABNORMAL HIGH (ref 0.00–0.07)
Basophils Absolute: 0 10*3/uL (ref 0.0–0.1)
Basophils Relative: 0 %
Eosinophils Absolute: 0.3 10*3/uL (ref 0.0–0.5)
Eosinophils Relative: 3 %
HCT: 27.2 % — ABNORMAL LOW (ref 36.0–46.0)
Hemoglobin: 8.8 g/dL — ABNORMAL LOW (ref 12.0–15.0)
Immature Granulocytes: 2 %
Lymphocytes Relative: 18 %
Lymphs Abs: 1.5 10*3/uL (ref 0.7–4.0)
MCH: 30.1 pg (ref 26.0–34.0)
MCHC: 32.4 g/dL (ref 30.0–36.0)
MCV: 93.2 fL (ref 80.0–100.0)
Monocytes Absolute: 0.8 10*3/uL (ref 0.1–1.0)
Monocytes Relative: 9 %
Neutro Abs: 5.5 10*3/uL (ref 1.7–7.7)
Neutrophils Relative %: 68 %
Platelets: 488 10*3/uL — ABNORMAL HIGH (ref 150–400)
RBC: 2.92 MIL/uL — ABNORMAL LOW (ref 3.87–5.11)
RDW: 17.4 % — ABNORMAL HIGH (ref 11.5–15.5)
WBC: 8.2 10*3/uL (ref 4.0–10.5)
nRBC: 0 % (ref 0.0–0.2)

## 2020-01-02 SURGERY — CHROMOSOME STUDIES
Anesthesia: Monitor Anesthesia Care

## 2020-01-02 MED ORDER — 0.9 % SODIUM CHLORIDE (POUR BTL) OPTIME
TOPICAL | Status: DC | PRN
  Administered 2020-01-02: 1000 mL

## 2020-01-02 MED ORDER — KETAMINE HCL 10 MG/ML IJ SOLN
INTRAMUSCULAR | Status: AC
  Filled 2020-01-02: qty 1

## 2020-01-02 MED ORDER — LACTATED RINGERS IV SOLN: INTRAVENOUS | Status: DC

## 2020-01-02 MED ORDER — KETAMINE HCL 10 MG/ML IJ SOLN
INTRAMUSCULAR | Status: DC | PRN
  Administered 2020-01-02 (×2): 10 mg via INTRAVENOUS
  Administered 2020-01-02 (×2): 5 mg via INTRAVENOUS

## 2020-01-02 MED ORDER — PROPOFOL 500 MG/50ML IV EMUL
INTRAVENOUS | Status: DC | PRN
  Administered 2020-01-02: 25 ug/kg/min via INTRAVENOUS

## 2020-01-02 MED ORDER — BUPIVACAINE HCL 0.25 % IJ SOLN
INTRAMUSCULAR | Status: DC | PRN
  Administered 2020-01-02: 10 mL

## 2020-01-02 MED ORDER — POTASSIUM CHLORIDE 10 MEQ/100ML IV SOLN
10.0000 meq | INTRAVENOUS | Status: AC
  Administered 2020-01-02 (×3): 10 meq via INTRAVENOUS
  Filled 2020-01-02 (×3): qty 100

## 2020-01-02 MED ORDER — ONDANSETRON HCL 4 MG/2ML IJ SOLN
INTRAMUSCULAR | Status: DC | PRN
  Administered 2020-01-02: 4 mg via INTRAVENOUS

## 2020-01-02 MED ORDER — BUPIVACAINE HCL 0.25 % IJ SOLN
INTRAMUSCULAR | Status: AC
  Filled 2020-01-02: qty 1

## 2020-01-02 MED ORDER — FENTANYL CITRATE (PF) 100 MCG/2ML IJ SOLN
INTRAMUSCULAR | Status: AC
  Filled 2020-01-02: qty 2

## 2020-01-02 MED ORDER — DEXAMETHASONE SODIUM PHOSPHATE 10 MG/ML IJ SOLN
INTRAMUSCULAR | Status: DC | PRN
  Administered 2020-01-02: 5 mg via INTRAVENOUS

## 2020-01-02 SURGICAL SUPPLY — 28 items
BIPOLAR CUTTING LOOP 21FR (ELECTRODE)
CANISTER SUCT 3000ML PPV (MISCELLANEOUS) ×1 IMPLANT
CATH ROBINSON RED A/P 16FR (CATHETERS) ×4 IMPLANT
COVER WAND RF STERILE (DRAPES) IMPLANT
DECANTER SPIKE VIAL GLASS SM (MISCELLANEOUS) ×1 IMPLANT
ELECT REM PT RETURN 15FT ADLT (MISCELLANEOUS) IMPLANT
GLOVE BIO SURGEON STRL SZ7 (GLOVE) ×3 IMPLANT
GLOVE BIOGEL PI IND STRL 7.0 (GLOVE) ×3 IMPLANT
GLOVE BIOGEL PI INDICATOR 7.0 (GLOVE)
GLOVE ECLIPSE 7.0 STRL STRAW (GLOVE) ×3 IMPLANT
GOWN STRL REUS W/TWL LRG LVL3 (GOWN DISPOSABLE) ×6 IMPLANT
HOVERMATT HALF SINGLE USE (PATIENT TRANSFER) ×2 IMPLANT
KIT BERKELEY 1ST TRIMESTER 3/8 (MISCELLANEOUS) ×1 IMPLANT
KIT TURNOVER KIT A (KITS) IMPLANT
LOOP CUTTING BIPOLAR 21FR (ELECTRODE) IMPLANT
NS IRRIG 1000ML POUR BTL (IV SOLUTION) ×3 IMPLANT
PACK VAGINAL MINOR WOMEN LF (CUSTOM PROCEDURE TRAY) ×4 IMPLANT
PAD OB MATERNITY 4.3X12.25 (PERSONAL CARE ITEMS) ×2 IMPLANT
PAD PREP 24X48 CUFFED NSTRL (MISCELLANEOUS) ×3 IMPLANT
PENCIL SMOKE EVACUATOR (MISCELLANEOUS) IMPLANT
SET BERKELEY SUCTION TUBING (SUCTIONS) ×1 IMPLANT
TOWEL OR 17X26 10 PK STRL BLUE (TOWEL DISPOSABLE) ×6 IMPLANT
TUBING AQUILEX INFLOW (TUBING) ×1 IMPLANT
TUBING AQUILEX OUTFLOW (TUBING) ×1 IMPLANT
VACURETTE 10 RIGID CVD (CANNULA) IMPLANT
VACURETTE 7MM CVD STRL WRAP (CANNULA) IMPLANT
VACURETTE 8 RIGID CVD (CANNULA) IMPLANT
VACURETTE 9 RIGID CVD (CANNULA) IMPLANT

## 2020-01-02 NOTE — Interval H&P Note (Signed)
History and Physical Interval Note:  01/02/2020 2:15 PM  Connie Miranda  has presented today for surgery, with the diagnosis of Post menopausal bleeding.  The various methods of treatment have been discussed with the patient and family. After consideration of risks, benefits and other options for treatment, the patient has consented to  Procedure(s): D&C WITH IUD REMOVAL (N/A) as a surgical intervention.  The patient's history has been reviewed, patient examined, no change in status, stable for surgery.  I have reviewed the patient's chart and labs.  Questions were answered to the patient's satisfaction.  She has recovered from her encephalopthy and is ready for surgical removal.   [name]

## 2020-01-02 NOTE — Progress Notes (Signed)
PT Cancellation Note  Patient Details Name: Connie Miranda MRN: [id number] DOB: Jul 10, 1933   Cancelled Treatment:    Reason Eval/Treat Not Completed: Other (comment) Order received, pt pending procedure today.  Per MD notes,"will start PT evaluation after the surgery today."  Will check back as schedule permits.   [name],[name] 01/02/2020, 10:37 AM [name], DPT Acute Rehabilitation Services Office: [phone]

## 2020-01-02 NOTE — Progress Notes (Signed)
[facility]                                                                              Patient Demographics  Connie Miranda, is a 86 y.o. female, DOB - [birth year], VOZ:[id number]  Admit date - 12/24/2019   Admitting Physician Rometta Emery, MD  Outpatient Primary MD for the patient is [name], MD  Outpatient specialists:   LOS - 9  days   Medical records reviewed and are as summarized below:    Chief Complaint  Patient presents with  . Altered Mental Status       Brief summary   86 year old female with morbid obesity, prior CVA, HTN, chronic anemia, G6PD deficiency, bilateral lower extremity lymphedema, pemphigus (according to patient ) recent vulval abscess, dysphagia who was brought in from home where she has been bedbound for almost past 5 years, she was diagnosed with cellulitis of the vulva and has been on Keflex for the last week.  She was was brought to the ED encephalopathic, found to be febrile to 104 with sepsis, imaging was concerning for endometritis and IUD -Gynecology was consulted, she slowly improved, plan was for [facility], removal of IUD however subsequently developed encephalopathy of unclear etiology with negative work-up which has since resolved.  Pending removal of IUD, D&C for endometriosis planned today on 1/4  Assessment & Plan    Principal Problem:   Sepsis (HCC), multifactorial likely due to cellulitis, endometritis/old IUD/thickened endometrium -OB/GYN consulted, initial plan was to go to or for IUD removal and endometrial biopsy however patient became encephalopathic on 12/29 hence it was deferred. -Patient was placed on broad-spectrum antibiotics, vancomycin cefepime and Flagyl.  Vancomycin was discontinued after MRSA PCR was negative. -Currently mental status has resolved, plan for removing IUD and D&C per OB/GYN today -Sepsis physiology resolved   Active problems Acute metabolic encephalopathy -In the setting of  sepsis, fevers, on 12/29, patient was noted to be more confused -CT head negative, MRI brain was also unremarkable.  ABG did not show CO2 retention -Cefepime was changed to ceftriaxone and Flagyl, suspected hospital delirium -Currently resolved, alert and oriented x3  Hypokalemia Replaced IV  Acute kidney injury -Likely in the setting of sepsis, resolved with IV fluids  Essential hypertension -BP currently stable, continue amlodipine  Left lower extremity cellulitis, history of bullous pemphigoid -Per patient, has a history of pemphigus, resolved several years ago and had reappeared again.  Possibly underlying cellulitis, antibiotics per #1 -Patient currently not on steroids or immunomodulators, recommend follow-up with dermatology outpatient  Chronic diastolic CHF Continue home regimen, currently stable, euvolemic   History of prior CVA -Continue home Plavix, does not appear to be on a statin -MRI of the brain with no acute findings  Constipation/fecal impaction Resolved  Obesity Estimated body mass index is 42.28 kg/m as calculated from the following:   Height as of this encounter: 5' 3.5" (1.613 m).   Weight as of this encounter: 110 kg.  Code Status: Full CODE STATUS DVT Prophylaxis: Heparin subcu Family Communication: Discussed all imaging results, lab results, explained to the patient   Disposition Plan:  will start PT evaluation  after the surgery today.  Likely DC home in next 24 to 48 hours once cleared from PT and OB/GYN  Time Spent in minutes 25 minutes  Procedures:  None  Consultants:    OB/GYN  Antimicrobials:   Anti-infectives (From admission, onward)   Start     Dose/Rate Route Frequency Ordered Stop   12/27/19 2200  metroNIDAZOLE (FLAGYL) IVPB 500 mg     500 mg 100 mL/hr over 60 Minutes Intravenous Every 8 hours 12/27/19 1502     12/27/19 1600  cefTRIAXone (ROCEPHIN) 2 g in sodium chloride 0.9 % 100 mL IVPB     2 g 200 mL/hr over 30  Minutes Intravenous Every 24 hours 12/27/19 1502     12/25/19 0600  metroNIDAZOLE (FLAGYL) IVPB 500 mg  Status:  Discontinued     500 mg 100 mL/hr over 60 Minutes Intravenous Every 8 hours 12/25/19 0450 12/27/19 1443   12/25/19 0500  ceFEPIme (MAXIPIME) 2 g in sodium chloride 0.9 % 100 mL IVPB  Status:  Discontinued     2 g 200 mL/hr over 30 Minutes Intravenous  Once 12/25/19 0450 12/25/19 0454   12/25/19 0500  vancomycin (VANCOCIN) IVPB 1000 mg/200 mL premix  Status:  Discontinued     1,000 mg 200 mL/hr over 60 Minutes Intravenous  Once 12/25/19 0450 12/25/19 0453   12/24/19 2200  ceFEPIme (MAXIPIME) 2 g in sodium chloride 0.9 % 100 mL IVPB  Status:  Discontinued     2 g 200 mL/hr over 30 Minutes Intravenous Every 12 hours 12/24/19 1127 12/27/19 1435   12/24/19 2200  vancomycin (VANCOCIN) IVPB 1000 mg/200 mL premix  Status:  Discontinued     1,000 mg 200 mL/hr over 60 Minutes Intravenous Every 36 hours 12/24/19 1943 12/26/19 1450   12/24/19 1045  ceFEPIme (MAXIPIME) 2 g in sodium chloride 0.9 % 100 mL IVPB     2 g 200 mL/hr over 30 Minutes Intravenous  Once 12/24/19 1044 12/24/19 1137   12/24/19 1045  metroNIDAZOLE (FLAGYL) IVPB 500 mg     500 mg 100 mL/hr over 60 Minutes Intravenous  Once 12/24/19 1044 12/24/19 1453   12/24/19 1045  vancomycin (VANCOCIN) IVPB 1000 mg/200 mL premix     1,000 mg 200 mL/hr over 60 Minutes Intravenous  Once 12/24/19 1044 12/24/19 1209          Medications  Scheduled Meds: . amLODipine  10 mg Oral Daily  . calcium-vitamin D   Oral Daily  . clopidogrel  75 mg Oral Daily  . cyanocobalamin  1,000 mcg Intramuscular Q30 days  . heparin  5,000 Units Subcutaneous Q8H  . hydrocerin   Topical Daily  . isosorbide mononitrate  120 mg Oral Daily  . multivitamin with minerals  1 tablet Oral Daily  . polyethylene glycol  17 g Oral BID  . senna-docusate  2 tablet Oral QHS   Continuous Infusions: . sodium chloride 250 mL (12/31/19 0737)  . cefTRIAXone  (ROCEPHIN)  IV 2 g (01/01/20 1710)  . metronidazole 500 mg (01/02/20 0526)   PRN Meds:.sodium chloride, acetaminophen, acetaminophen, polyethylene glycol, polyvinyl alcohol      Subjective:   Shanike Cobleigh was seen and examined today.  Alert and oriented, denies any specific complaints, wants to know when she is going to the OR.  Patient denies dizziness, chest pain, shortness of breath, abdominal pain, N/V/D/C, new weakness, numbess, tingling. No acute events overnight.    Objective:   Vitals:   01/01/20 0546 01/01/20 1419 01/01/20  2049 01/02/20 0556  BP: 118/65 137/69 (!) 136/57 (!) 132/58  Pulse: 86 88 93 89  Resp: 18 16 16 19   Temp: 97.7 F (36.5 C) 97.9 F (36.6 C) 98.3 F (36.8 C) 98 F (36.7 C)  TempSrc: Oral Oral Oral Oral  SpO2: 99% 97% 97% 99%  Weight:      Height:        Intake/Output Summary (Last 24 hours) at 01/02/2020 5409 Last data filed at 01/02/2020 0557 Gross per 24 hour  Intake 950 ml  Output 775 ml  Net 175 ml     Wt Readings from Last 3 Encounters:  12/26/19 110 kg  06/14/18 115.4 kg  04/05/17 113.4 kg     Exam  General: Alert and oriented x 3, NAD  Eyes:   HEENT:  Atraumatic, normocephalic  Cardiovascular: S1 S2 auscultated, no murmurs, RRR  Respiratory: Clear to auscultation bilaterally, no wheezing, rales or rhonchi  Gastrointestinal: Soft, nontender, nondistended, + bowel sounds  Ext: no pedal edema bilaterally  Neuro: No new deficits  Musculoskeletal: No digital cyanosis, clubbing  Skin: Areas of hyper and hypopigmentation bilateral lower extremities, left lower extremity tender to touch with mild erythema  Psych: Normal affect and demeanor, alert and oriented x3    Data Reviewed:  I have personally reviewed following labs and imaging studies  Micro Results Recent Results (from the past 240 hour(s))  Culture, blood (Routine x 2)     Status: None   Collection Time: 12/24/19 10:30 AM   Specimen: BLOOD  Result Value  Ref Range Status   Specimen Description   Final    BLOOD RIGHT ANTECUBITAL Performed at [facility], [address] [place], [place] [address]    Special Requests   Final    BOTTLES DRAWN AEROBIC AND ANAEROBIC Blood Culture adequate volume Performed at [facility], [address], [place], [place] [address]    Culture   Final    NO GROWTH 5 DAYS Performed at [facility], [address], [place], [place] [address]    Report Status 12/29/2019 FINAL  Final  Culture, blood (Routine x 2)     Status: None   Collection Time: 12/24/19 10:35 AM   Specimen: BLOOD LEFT HAND  Result Value Ref Range Status   Specimen Description   Final    BLOOD LEFT HAND Performed at [facility], [address], [place], [place] [address]    Special Requests   Final    BOTTLES DRAWN AEROBIC AND ANAEROBIC Blood Culture results may not be optimal due to an inadequate volume of blood received in culture bottles Performed at [facility], [address], [place], [place] [address]    Culture   Final    NO GROWTH 5 DAYS Performed at [facility], [address] [place], [place] [address]    Report Status 12/29/2019 FINAL  Final  Urine culture     Status: None   Collection Time: 12/24/19 10:39 AM   Specimen: In/Out Cath Urine  Result Value Ref Range Status   Specimen Description   Final    IN/OUT CATH URINE Performed at [facility], [address], [place], [place] [address]    Special Requests   Final    NONE Performed at [facility], [address] [place], [place] [address]    Culture   Final    NO GROWTH Performed at [facility], [address] [place], [place] [address]    Report Status 12/25/2019 FINAL  Final  SARS CORONAVIRUS 2 (TAT 6-24 HRS) Nasopharyngeal Nasopharyngeal Swab     Status: None   Collection Time: 12/24/19  1:32 PM   Specimen: Nasopharyngeal Swab  Result Value Ref  Range Status   SARS Coronavirus 2 NEGATIVE NEGATIVE Final    Comment: (NOTE) SARS-CoV-2 target nucleic acids are NOT DETECTED. The SARS-CoV-2 RNA is generally detectable in upper and lower respiratory specimens during the acute phase of infection. Negative results do not preclude SARS-CoV-2 infection, do not rule out co-infections with other pathogens, and should not be used as the sole basis for treatment or other patient management decisions. Negative results must be combined with clinical observations, patient history, and epidemiological information. The expected result is Negative. Fact Sheet for Patients: HairSlick.no Fact Sheet for Healthcare Providers: quierodirigir.com This test is not yet approved or cleared by the [place] FDA and  has been authorized for detection and/or diagnosis of SARS-CoV-2 by FDA under an Emergency Use Authorization (EUA). This EUA will remain  in effect (meaning this test can be used) for the duration of the COVID-19 declaration under Section 56 4(b)(1) of the Act, 21 U.S.C. section 360bbb-3(b)(1), unless the authorization is terminated or revoked sooner. Performed at [facility], [address] [place], [place] [address]   MRSA PCR Screening     Status: None   Collection Time: 12/25/19  7:04 PM   Specimen: Nasal Mucosa; Nasopharyngeal  Result Value Ref Range Status   MRSA by PCR NEGATIVE NEGATIVE Final    Comment:        The GeneXpert MRSA Assay (FDA approved for NASAL specimens only), is one component of a comprehensive MRSA colonization surveillance program. It is not intended to diagnose MRSA infection nor to guide or monitor treatment for MRSA infections. Performed at [facility], [address], [place], [place] [address]     Radiology Reports CT ABDOMEN PELVIS WO CONTRAST  Result Date: 12/24/2019 CLINICAL DATA:  History of vaginal abscess.  EXAM: CT ABDOMEN AND PELVIS WITHOUT CONTRAST TECHNIQUE: Multidetector CT imaging of the abdomen and pelvis was performed following the standard protocol without IV contrast. COMPARISON:  12/15/2019 FINDINGS: Lower chest: Unremarkable. Hepatobiliary: No focal abnormality in the liver on this study without intravenous contrast. Gallbladder is collapsed. Gallstones demonstrated by cholangiogram on 06/11/2017 are not evident on this CT. No intrahepatic or extrahepatic biliary dilation. Pancreas: No focal mass lesion. No dilatation of the main duct. No intraparenchymal cyst. No peripancreatic edema. Spleen: No splenomegaly. No focal mass lesion. Adrenals/Urinary Tract: No adrenal nodule or mass. Kidneys unremarkable. No evidence for hydroureter. Bladder decompressed. Stomach/Bowel: Stomach is unremarkable. No gastric wall thickening. No evidence of outlet obstruction. Duodenum is normally positioned as is the ligament of Treitz. No small bowel wall thickening. No small bowel dilatation. The terminal ileum is normal. The appendix is normal. Diverticular changes are noted in the left colon without evidence of diverticulitis. Marked stool volume noted in the rectum with rectal diameter measuring 12.8 x 9.3 cm, similar to prior. No substantial perirectal edema or inflammation at this time. Vascular/Lymphatic: There is abdominal aortic atherosclerosis without aneurysm. There is no gastrohepatic or hepatoduodenal ligament lymphadenopathy. No retroperitoneal or mesenteric lymphadenopathy. No pelvic sidewall lymphadenopathy. Reproductive: IUD visualized in the uterus. Uterus appears thickened and prior study with contrast material demonstrated marked prominence of the endometrium. There is no adnexal mass. The right-sided vaginal fluid collection measured  on the previous study appears decreased. There also appears to be a relatively stable fluid collection involving the left labia majora. This is a focal abnormality and there is  no edema or inflammation in the soft tissues of the perineum. There is no gas in the soft tissues of the perineum. Other: No intraperitoneal free fluid. Musculoskeletal: Bones are diffusely demineralized. No worrisome lytic or sclerotic osseous abnormality. IMPRESSION: 1. The previously measured right vaginal fluid collection at 2.8 x 1 point 0 cm is not clearly demonstrated on today's study. There does appear to be a fluid collection in the left labia majora measuring 4.3 x 1.7 cm, similar to prior study. No features on today's study to suggest necrotizing fasciitis. 2. Marked apparent thickening of the endometrium. Neoplasm not excluded. Pelvic ultrasound recommended to further evaluate. 3. Collapsed gallbladder. Gallstones documented on previous cholecystostomy tube injection are not visualized on today's CT. 4.  Aortic Atherosclerois (ICD10-170.0) Electronically Signed   By: Kennith Center M.D.   On: 12/24/2019 12:44   DG Chest 2 View  Result Date: 12/24/2019 CLINICAL DATA:  Altered mental status, shortness of breath. EXAM: CHEST - 2 VIEW COMPARISON:  None. FINDINGS: The heart size and mediastinal contours are within normal limits. Both lungs are clear. The visualized skeletal structures are stable. IMPRESSION: No active cardiopulmonary disease. Electronically Signed   By: Sherian Rein M.D.   On: 12/24/2019 12:02   CT HEAD WO CONTRAST  Result Date: 12/27/2019 CLINICAL DATA:  Altered mental status EXAM: CT HEAD WITHOUT CONTRAST TECHNIQUE: Contiguous axial images were obtained from the base of the skull through the vertex without intravenous contrast. COMPARISON:  CT head dated 03/24/2019 FINDINGS: Brain: No evidence of acute infarction, hemorrhage, hydrocephalus, extra-axial collection or mass lesion/mass effect. There is moderate cerebral volume loss with associated ex vacuo dilatation. Periventricular white matter hypoattenuation likely represents chronic small vessel ischemic disease. Vascular: There  are vascular calcifications in the carotid siphons. Skull: Normal. Negative for fracture or focal lesion. Sinuses/Orbits: No acute finding. Other: None. IMPRESSION: 1. No acute intracranial process. Electronically Signed   By: Romona Curls M.D.   On: 12/27/2019 10:42   CT PELVIS W CONTRAST  Result Date: 12/15/2019 CLINICAL DATA:  Lymphadenopathy with rectal bleeding and possible inguinal abscess. EXAM: CT PELVIS WITH CONTRAST TECHNIQUE: Multidetector CT imaging of the pelvis was performed using the standard protocol following the bolus administration of intravenous contrast. CONTRAST:  OMNIPAQUE IOHEXOL 300 MG/ML  SOLN COMPARISON:  CT of the pelvis dated April 06, 2017. FINDINGS: Urinary Tract:  No abnormality visualized. Bowel: There is a very large amount of stool at the level of the rectum. There is sigmoid diverticulosis without CT evidence for diverticulitis. The remaining small bowel is unremarkable. The appendix is unremarkable. Vascular/Lymphatic: There are atherosclerotic changes involving the abdominal aorta and the common iliac vasculature. Reproductive: There is diffuse enlargement of the uterus with extensive thickening of the endometrial stripe. There is a probable small to moderate volume of free fluid in the patient's endometrial canal. A loop IUD is noted in the lower uterine segment. There is a small amount of fluid in the vaginal vault. The ovaries appeared grossly unremarkable where visualized. Other: There is a possible thin 2.8 by 1 cm fluid collection in the right vulva. There is mild surrounding fat stranding. There are no pathologically enlarged inguinal lymph nodes. No pathologically enlarged pelvic lymph nodes. Musculoskeletal: There are advanced degenerative changes at the L5-S1 level. There is diffuse osteopenia which limits detection of  nondisplaced fractures. IMPRESSION: 1. There is a possible thin 2.8 x 1 cm fluid collection in the right vulva. This may represent a small  abscess in the appropriate clinical setting. 2. Marked thickening of the endometrial stripe with a loop IUD in the lower uterine segment. Ultrasound follow-up is recommended. 3. Sigmoid diverticulosis without CT evidence for diverticulitis. 4. Large amount of stool at the level of the rectum. Correlate for constipation/fecal impaction. 5. Diffuse osteopenia. Aortic Atherosclerosis (ICD10-I70.0). Electronically Signed   By: Katherine Mantle M.D.   On: 12/15/2019 19:59   MR BRAIN WO CONTRAST  Result Date: 12/29/2019 CLINICAL DATA:  Sudden onset encephalopathy EXAM: MRI HEAD WITHOUT CONTRAST TECHNIQUE: Multiplanar, multiecho pulse sequences of the brain and surrounding structures were obtained without intravenous contrast. COMPARISON:  MRI head 04/10/2013 FINDINGS: Brain: Moderate atrophy and ventricular enlargement. Mild chronic white matter changes similar to the prior study. No acute infarct, hemorrhage, mass. No fluid collection or midline shift. Vascular: Normal arterial flow voids. Skull and upper cervical spine: Negative Sinuses/Orbits: Mild mucosal edema paranasal sinuses. No orbital mass. Other: None IMPRESSION: Moderate atrophy with mild chronic microvascular ischemic change in the white matter No acute intracranial abnormality. Electronically Signed   By: Marlan Palau M.D.   On: 12/29/2019 14:12   US PELVIS (TRANSABDOMINAL ONLY)  Result Date: 12/26/2019 CLINICAL DATA:  Initial evaluation for thickened endometrium. EXAM: TRANSABDOMINAL ULTRASOUND OF PELVIS TECHNIQUE: Transabdominal ultrasound examination of the pelvis was performed including evaluation of the uterus, ovaries, adnexal regions, and pelvic cul-de-sac. COMPARISON:  Prior CT from 12/24/2019. FINDINGS: Uterus Measurements: 11.0 x 5.8 x 7.4 cm = volume: 248.8 mL. No fibroids or other mass visualized. Endometrium Thickness: 19.1 mm.  No focal abnormality visualized. Right ovary Not visualized.  No adnexal mass. Left ovary Not  visualized.  No adnexal mass. Other findings:  No abnormal free fluid. IMPRESSION: 1. Thickened endometrial stripe measuring up to 19.1 mm in thickness. Endometrial thickness is considered abnormal for an asymptomatic post-menopausal female. Endometrial sampling should be considered to exclude carcinoma. 2. Nonvisualization of the ovaries. No adnexal mass or abnormal free fluid within the pelvis. 3. No other acute abnormality. Electronically Signed   By: Rise Mu M.D.   On: 12/26/2019 21:13    Lab Data:  CBC: Recent Labs  Lab 12/28/19 0628 12/29/19 0602 12/30/19 0536 01/01/20 0631 01/02/20 0544  WBC 9.1 7.8 9.0 8.1 8.2  NEUTROABS  --   --   --   --  5.5  HGB 9.0* 8.5* 9.8* 9.3* 8.8*  HCT 29.2* 27.4* 31.8* 30.7* 27.2*  MCV 93.0 94.8 95.2 96.8 93.2  PLT 317 350 474* 498* 488*   Basic Metabolic Panel: Recent Labs  Lab 12/28/19 0628 12/29/19 0602 12/30/19 0536 12/31/19 0505 01/01/20 0631 01/02/20 0544  NA 139 140 141  --  142 141  K 3.0* 3.1* 3.5  --  3.0* 3.1*  CL 112* 111 111  --  111 113*  CO2 20* 20* 21*  --  23 23  GLUCOSE 94 106* 108*  --  113* 106*  BUN 19 17 12   --  7* 7*  CREATININE 1.09* 0.92 0.94 0.74 0.63 0.73  CALCIUM 8.3* 8.3* 8.7*  --  8.6* 8.3*   GFR: Estimated Creatinine Clearance: 63 mL/min (by C-G formula based on SCr of 0.73 mg/dL). Liver Function Tests: Recent Labs  Lab 12/30/19 0536  AST 62*  ALT 47*  ALKPHOS 119  BILITOT 0.8  PROT 7.2  ALBUMIN 2.7*   No results  for input(s): LIPASE, AMYLASE in the last 168 hours. Recent Labs  Lab 12/29/19 1548 12/30/19 0536  AMMONIA 54* 36*   Coagulation Profile: Recent Labs  Lab 12/28/19 0628  INR 1.3*   Cardiac Enzymes: No results for input(s): CKTOTAL, CKMB, CKMBINDEX, TROPONINI in the last 168 hours. BNP (last 3 results) No results for input(s): PROBNP in the last 8760 hours. HbA1C: No results for input(s): HGBA1C in the last 72 hours. CBG: No results for input(s): GLUCAP in the  last 168 hours. Lipid Profile: No results for input(s): CHOL, HDL, LDLCALC, TRIG, CHOLHDL, LDLDIRECT in the last 72 hours. Thyroid Function Tests: No results for input(s): TSH, T4TOTAL, FREET4, T3FREE, THYROIDAB in the last 72 hours. Anemia Panel: No results for input(s): VITAMINB12, FOLATE, FERRITIN, TIBC, IRON, RETICCTPCT in the last 72 hours. Urine analysis:    Component Value Date/Time   COLORURINE ORANGE (A) 12/24/2019 1030   APPEARANCEUR TURBID (A) 12/24/2019 1030   LABSPEC >1.030 (H) 12/24/2019 1030   PHURINE 5.0 12/24/2019 1030   GLUCOSEU NEGATIVE 12/24/2019 1030   HGBUR LARGE (A) 12/24/2019 1030   BILIRUBINUR MODERATE (A) 12/24/2019 1030   KETONESUR 15 (A) 12/24/2019 1030   PROTEINUR 100 (A) 12/24/2019 1030   UROBILINOGEN 1.0 04/09/2013 0151   NITRITE POSITIVE (A) 12/24/2019 1030   LEUKOCYTESUR TRACE (A) 12/24/2019 1030     Talasia Saulter M.D. [facility] 01/02/2020, 9:23 AM   Call night coverage person covering after 7pm

## 2020-01-02 NOTE — Discharge Instructions (Signed)
Dilation and Curettage or Vacuum Curettage, Care After These instructions give you information about caring for yourself after your procedure. Your doctor may also give you more specific instructions. Call your doctor if you have any problems or questions after your procedure. Follow these instructions at home: Activity  Do not drive or use heavy machinery while taking prescription pain medicine.  For 24 hours after your procedure, avoid driving.  Take short walks often, followed by rest periods. Ask your doctor what activities are safe for you. After one or two days, you may be able to return to your normal activities.  Do not lift anything that is heavier than 10 lb (4.5 kg) until your doctor approves.  For at least 2 weeks, or as long as told by your doctor: ? Do not douche. ? Do not use tampons. ? Do not have sex. General instructions   Take over-the-counter and prescription medicines only as told by your doctor. This is very important if you take blood thinning medicine.  Do not take baths, swim, or use a hot tub until your doctor approves. Take showers instead of baths.  Wear compression stockings as told by your doctor.  It is up to you to get the results of your procedure. Ask your doctor when your results will be ready.  Keep all follow-up visits as told by your doctor. This is important. Contact a doctor if:  You have very bad cramps that get worse or do not get better with medicine.  You have very bad pain in your belly (abdomen).  You cannot drink fluids without throwing up (vomiting).  You get pain in a different part of the area between your belly and thighs (pelvis).  You have bad-smelling discharge from your vagina.  You have a rash. Get help right away if:  You are bleeding a lot from your vagina. A lot of bleeding means soaking more than one sanitary pad in an hour, for 2 hours in a row.  You have clumps of blood (blood clots) coming from your  vagina.  You have a fever or chills.  Your belly feels very tender or hard.  You have chest pain.  You have trouble breathing.  You cough up blood.  You feel dizzy.  You feel light-headed.  You pass out (faint).  You have pain in your neck or shoulder area. Summary  Take short walks often, followed by rest periods. Ask your doctor what activities are safe for you. After one or two days, you may be able to return to your normal activities.  Do not lift anything that is heavier than 10 lb (4.5 kg) until your doctor approves.  Do not take baths, swim, or use a hot tub until your doctor approves. Take showers instead of baths.  Contact your doctor if you have any symptoms of infection, like bad-smelling discharge from your vagina. This information is not intended to replace advice given to you by your health care provider. Make sure you discuss any questions you have with your health care provider. Document Revised: 11/27/2017 Document Reviewed: 09/01/2016 Elsevier Patient Education  2020 [employer]  

## 2020-01-02 NOTE — Anesthesia Preprocedure Evaluation (Signed)
Anesthesia Evaluation  Patient identified by MRN, date of birth, ID band Patient awake  General Assessment Comment:Bed bound for 5 years  Reviewed: Allergy & Precautions, NPO status , Patient's Chart, lab work & pertinent test results  Airway Mallampati: II  TM Distance: >3 FB Neck ROM: Limited    Dental no notable dental hx.    Pulmonary neg pulmonary ROS, former smoker,    Pulmonary exam normal breath sounds clear to auscultation       Cardiovascular hypertension, +CHF  Normal cardiovascular exam Rhythm:Regular Rate:Normal     Neuro/Psych CVA negative psych ROS   GI/Hepatic negative GI ROS, Neg liver ROS,   Endo/Other  Morbid obesity  Renal/GU negative Renal ROS  negative genitourinary   Musculoskeletal negative musculoskeletal ROS (+)   Abdominal   Peds negative pediatric ROS (+)  Hematology negative hematology ROS (+) anemia ,   Anesthesia Other Findings   Reproductive/Obstetrics negative OB ROS                             Anesthesia Physical Anesthesia Plan  ASA: IV  Anesthesia Plan: MAC   Post-op Pain Management:    Induction: Intravenous  PONV Risk Score and Plan:   Airway Management Planned: Simple Face Mask  Additional Equipment:   Intra-op Plan:   Post-operative Plan:   Informed Consent: I have reviewed the patients History and Physical, chart, labs and discussed the procedure including the risks, benefits and alternatives for the proposed anesthesia with the patient or authorized representative who has indicated his/her understanding and acceptance.     Dental advisory given  Plan Discussed with: CRNA and Surgeon  Anesthesia Plan Comments:         Anesthesia Quick Evaluation

## 2020-01-02 NOTE — Op Note (Signed)
PROCEDURE DATE: 01/02/2020  PREOPERATIVE DIAGNOSIS: Thickened endometrium, postmenopausal bleeding, retained IUD, sepsis  POSTOPERATIVE DIAGNOSIS: The same  PROCEDURE:  Dilation and Curettage with IUD removal.  SURGEON:  Efren Kross S  INDICATIONS: Pt. Admitted with sepsis, noted to have retained IUD, PMB and for diagnostic w/u.  Risks of surgery were discussed with the patient including but not limited to: bleeding which may require transfusion; infection which may require antibiotics; injury to uterus or surrounding organs.  Likelihood of obtaining sample is high.  FINDINGS:  An eight week size uterus, nml appearing cervix.  ANESTHESIA:    Monitored intravenous sedation, paracervical block-0.25% Marcaine, plain  ESTIMATED BLOOD LOSS:  Less than 20 ml.  SPECIMENS:  Endometrial curettings to pathology  COMPLICATIONS:  None immediate.  PROCEDURE DETAILS:  The patient received intravenous antibiotics while in the hospital.  She was then taken to the operating room where MAC anesthesia was administered and was found to be adequate.  After an adequate timeout was performed, she was placed in the dorsal lithotomy position; then prepped and draped in the sterile manner.  A vaginal speculum was then placed in the patient's vagina and a single tooth tenaculum was applied to the anterior lip of the cervix.  A paracervical block using 0.25% Marcaine was administered. The uterus sounded to 8 cm. The cervix was gently dilated to accommodate a small curette that was gently advanced to the uterine fundus.  A sharp curettage was then performed to obtain adequate specimen. IUD hook and uterine forceps used to remove Lippes Loop IUD intact.There was minimal bleeding noted and the tenaculum removed with good hemostasis noted.  The patient tolerated the procedure well.  The patient was taken to the recovery area in stable condition.  Standley Dakins PrattMD 01/02/2020 3:19 PM

## 2020-01-02 NOTE — Anesthesia Procedure Notes (Signed)
Procedure Name: MAC Date/Time: 01/02/2020 2:25 PM Performed by: West Pugh, CRNA Pre-anesthesia Checklist: Patient identified, Emergency Drugs available, Suction available, Patient being monitored and Timeout performed Patient Re-evaluated:Patient Re-evaluated prior to induction Oxygen Delivery Method: Simple face mask Preoxygenation: Pre-oxygenation with 100% oxygen Induction Type: IV induction Placement Confirmation: positive ETCO2 Dental Injury: Teeth and Oropharynx as per pre-operative assessment

## 2020-01-02 NOTE — Anesthesia Postprocedure Evaluation (Signed)
Anesthesia Post Note  Patient: Connie Miranda  Procedure(s) Performed: D&C WITH IUD REMOVAL (N/A )     Patient location during evaluation: PACU Anesthesia Type: MAC Level of consciousness: awake and alert Pain management: pain level controlled Vital Signs Assessment: post-procedure vital signs reviewed and stable Respiratory status: spontaneous breathing, nonlabored ventilation, respiratory function stable and patient connected to nasal cannula oxygen Cardiovascular status: stable and blood pressure returned to baseline Postop Assessment: no apparent nausea or vomiting Anesthetic complications: no    Last Vitals:  Vitals:   01/02/20 1600 01/02/20 1618  BP: (!) 179/76 (!) 176/69  Pulse: 83 83  Resp: 17 18  Temp: 36.7 C 36.4 C  SpO2: 100% 97%    Last Pain:  Vitals:   01/02/20 1618  TempSrc: Oral  PainSc:                  Ky Moskowitz S

## 2020-01-02 NOTE — Progress Notes (Signed)
Subjective: Interval History:More alert and oriented. Still in need of sampling and IUD removal.  Objective: Vital signs in last 24 hours: Temp:  [98 F (36.7 C)-98.7 F (37.1 C)] 98.7 F (37.1 C) (01/04 1402) Pulse Rate:  [89-93] 90 (01/04 1402) Resp:  [16-19] 18 (01/04 1402) BP: (132-153)/(57-97) 153/97 (01/04 1402) SpO2:  [97 %-99 %] 97 % (01/04 1402)  Intake/Output from previous day: 01/03 0701 - 01/04 0700 In: 950 [P.O.:950] Out: 775 [Urine:775] Intake/Output this shift: No intake/output data recorded.  General appearance: alert, cooperative and appears stated age Head: Normocephalic, without obvious abnormality, atraumatic Neck: supple, symmetrical, trachea midline Lungs: normal effort Heart: regular rate and rhythm Abdomen: soft, non-tender; bowel sounds normal; no masses,  no organomegaly Extremities: extremities normal, atraumatic, no cyanosis or edema  Results for orders placed or performed during the hospital encounter of 12/24/19 (from the past 24 hour(s))  CBC with Differential/Platelet     Status: Abnormal   Collection Time: 01/02/20  5:44 AM  Result Value Ref Range   WBC 8.2 4.0 - 10.5 K/uL   RBC 2.92 (L) 3.87 - 5.11 MIL/uL   Hemoglobin 8.8 (L) 12.0 - 15.0 g/dL   HCT 27.2 (L) 36.0 - 46.0 %   MCV 93.2 80.0 - 100.0 fL   MCH 30.1 26.0 - 34.0 pg   MCHC 32.4 30.0 - 36.0 g/dL   RDW 17.4 (H) 11.5 - 15.5 %   Platelets 488 (H) 150 - 400 K/uL   nRBC 0.0 0.0 - 0.2 %   Neutrophils Relative % 68 %   Neutro Abs 5.5 1.7 - 7.7 K/uL   Lymphocytes Relative 18 %   Lymphs Abs 1.5 0.7 - 4.0 K/uL   Monocytes Relative 9 %   Monocytes Absolute 0.8 0.1 - 1.0 K/uL   Eosinophils Relative 3 %   Eosinophils Absolute 0.3 0.0 - 0.5 K/uL   Basophils Relative 0 %   Basophils Absolute 0.0 0.0 - 0.1 K/uL   Immature Granulocytes 2 %   Abs Immature Granulocytes 0.15 (H) 0.00 - 0.07 K/uL  Basic metabolic panel     Status: Abnormal   Collection Time: 01/02/20  5:44 AM  Result  Value Ref Range   Sodium 141 135 - 145 mmol/L   Potassium 3.1 (L) 3.5 - 5.1 mmol/L   Chloride 113 (H) 98 - 111 mmol/L   CO2 23 22 - 32 mmol/L   Glucose, Bld 106 (H) 70 - 99 mg/dL   BUN 7 (L) 8 - 23 mg/dL   Creatinine, Ser 0.73 0.44 - 1.00 mg/dL   Calcium 8.3 (L) 8.9 - 10.3 mg/dL   GFR calc non Af Amer >60 >60 mL/min   GFR calc Af Amer >60 >60 mL/min   Anion gap 5 5 - 15    Studies/Results: CT ABDOMEN PELVIS WO CONTRAST  Result Date: 12/24/2019 CLINICAL DATA:  History of vaginal abscess. EXAM: CT ABDOMEN AND PELVIS WITHOUT CONTRAST TECHNIQUE: Multidetector CT imaging of the abdomen and pelvis was performed following the standard protocol without IV contrast. COMPARISON:  12/15/2019 FINDINGS: Lower chest: Unremarkable. Hepatobiliary: No focal abnormality in the liver on this study without intravenous contrast. Gallbladder is collapsed. Gallstones demonstrated by cholangiogram on 06/11/2017 are not evident on this CT. No intrahepatic or extrahepatic biliary dilation. Pancreas: No focal mass lesion. No dilatation of the main duct. No intraparenchymal cyst. No peripancreatic edema. Spleen: No splenomegaly. No focal mass lesion. Adrenals/Urinary Tract: No adrenal nodule or mass. Kidneys unremarkable. No evidence for hydroureter. Bladder  decompressed. Stomach/Bowel: Stomach is unremarkable. No gastric wall thickening. No evidence of outlet obstruction. Duodenum is normally positioned as is the ligament of Treitz. No small bowel wall thickening. No small bowel dilatation. The terminal ileum is normal. The appendix is normal. Diverticular changes are noted in the left colon without evidence of diverticulitis. Marked stool volume noted in the rectum with rectal diameter measuring 12.8 x 9.3 cm, similar to prior. No substantial perirectal edema or inflammation at this time. Vascular/Lymphatic: There is abdominal aortic atherosclerosis without aneurysm. There is no gastrohepatic or hepatoduodenal ligament  lymphadenopathy. No retroperitoneal or mesenteric lymphadenopathy. No pelvic sidewall lymphadenopathy. Reproductive: IUD visualized in the uterus. Uterus appears thickened and prior study with contrast material demonstrated marked prominence of the endometrium. There is no adnexal mass. The right-sided vaginal fluid collection measured on the previous study appears decreased. There also appears to be a relatively stable fluid collection involving the left labia majora. This is a focal abnormality and there is no edema or inflammation in the soft tissues of the perineum. There is no gas in the soft tissues of the perineum. Other: No intraperitoneal free fluid. Musculoskeletal: Bones are diffusely demineralized. No worrisome lytic or sclerotic osseous abnormality. IMPRESSION: 1. The previously measured right vaginal fluid collection at 2.8 x 1 point 0 cm is not clearly demonstrated on today's study. There does appear to be a fluid collection in the left labia majora measuring 4.3 x 1.7 cm, similar to prior study. No features on today's study to suggest necrotizing fasciitis. 2. Marked apparent thickening of the endometrium. Neoplasm not excluded. Pelvic ultrasound recommended to further evaluate. 3. Collapsed gallbladder. Gallstones documented on previous cholecystostomy tube injection are not visualized on today's CT. 4.  Aortic Atherosclerois (ICD10-170.0) Electronically Signed   By: Misty Stanley M.D.   On: 12/24/2019 12:44   DG Chest 2 View  Result Date: 12/24/2019 CLINICAL DATA:  Altered mental status, shortness of breath. EXAM: CHEST - 2 VIEW COMPARISON:  None. FINDINGS: The heart size and mediastinal contours are within normal limits. Both lungs are clear. The visualized skeletal structures are stable. IMPRESSION: No active cardiopulmonary disease. Electronically Signed   By: Abelardo Diesel M.D.   On: 12/24/2019 12:02   CT HEAD WO CONTRAST  Result Date: 12/27/2019 CLINICAL DATA:  Altered mental status  EXAM: CT HEAD WITHOUT CONTRAST TECHNIQUE: Contiguous axial images were obtained from the base of the skull through the vertex without intravenous contrast. COMPARISON:  CT head dated 03/24/2019 FINDINGS: Brain: No evidence of acute infarction, hemorrhage, hydrocephalus, extra-axial collection or mass lesion/mass effect. There is moderate cerebral volume loss with associated ex vacuo dilatation. Periventricular white matter hypoattenuation likely represents chronic small vessel ischemic disease. Vascular: There are vascular calcifications in the carotid siphons. Skull: Normal. Negative for fracture or focal lesion. Sinuses/Orbits: No acute finding. Other: None. IMPRESSION: 1. No acute intracranial process. Electronically Signed   By: Zerita Boers M.D.   On: 12/27/2019 10:42   CT PELVIS W CONTRAST  Result Date: 12/15/2019 CLINICAL DATA:  Lymphadenopathy with rectal bleeding and possible inguinal abscess. EXAM: CT PELVIS WITH CONTRAST TECHNIQUE: Multidetector CT imaging of the pelvis was performed using the standard protocol following the bolus administration of intravenous contrast. CONTRAST:  116mL OMNIPAQUE IOHEXOL 300 MG/ML  SOLN COMPARISON:  CT of the pelvis dated April 06, 2017. FINDINGS: Urinary Tract:  No abnormality visualized. Bowel: There is a very large amount of stool at the level of the rectum. There is sigmoid diverticulosis without CT evidence for  diverticulitis. The remaining small bowel is unremarkable. The appendix is unremarkable. Vascular/Lymphatic: There are atherosclerotic changes involving the abdominal aorta and the common iliac vasculature. Reproductive: There is diffuse enlargement of the uterus with extensive thickening of the endometrial stripe. There is a probable small to moderate volume of free fluid in the patient's endometrial canal. A loop IUD is noted in the lower uterine segment. There is a small amount of fluid in the vaginal vault. The ovaries appeared grossly unremarkable  where visualized. Other: There is a possible thin 2.8 by 1 cm fluid collection in the right vulva. There is mild surrounding fat stranding. There are no pathologically enlarged inguinal lymph nodes. No pathologically enlarged pelvic lymph nodes. Musculoskeletal: There are advanced degenerative changes at the L5-S1 level. There is diffuse osteopenia which limits detection of nondisplaced fractures. IMPRESSION: 1. There is a possible thin 2.8 x 1 cm fluid collection in the right vulva. This may represent a small abscess in the appropriate clinical setting. 2. Marked thickening of the endometrial stripe with a loop IUD in the lower uterine segment. Ultrasound follow-up is recommended. 3. Sigmoid diverticulosis without CT evidence for diverticulitis. 4. Large amount of stool at the level of the rectum. Correlate for constipation/fecal impaction. 5. Diffuse osteopenia. Aortic Atherosclerosis (ICD10-I70.0). Electronically Signed   By: Constance Holster M.D.   On: 12/15/2019 19:59   MR BRAIN WO CONTRAST  Result Date: 12/29/2019 CLINICAL DATA:  Sudden onset encephalopathy EXAM: MRI HEAD WITHOUT CONTRAST TECHNIQUE: Multiplanar, multiecho pulse sequences of the brain and surrounding structures were obtained without intravenous contrast. COMPARISON:  MRI head 04/10/2013 FINDINGS: Brain: Moderate atrophy and ventricular enlargement. Mild chronic white matter changes similar to the prior study. No acute infarct, hemorrhage, mass. No fluid collection or midline shift. Vascular: Normal arterial flow voids. Skull and upper cervical spine: Negative Sinuses/Orbits: Mild mucosal edema paranasal sinuses. No orbital mass. Other: None IMPRESSION: Moderate atrophy with mild chronic microvascular ischemic change in the white matter No acute intracranial abnormality. Electronically Signed   By: Franchot Gallo M.D.   On: 12/29/2019 14:12   US PELVIS (TRANSABDOMINAL ONLY)  Result Date: 12/26/2019 CLINICAL DATA:  Initial evaluation  for thickened endometrium. EXAM: TRANSABDOMINAL ULTRASOUND OF PELVIS TECHNIQUE: Transabdominal ultrasound examination of the pelvis was performed including evaluation of the uterus, ovaries, adnexal regions, and pelvic cul-de-sac. COMPARISON:  Prior CT from 12/24/2019. FINDINGS: Uterus Measurements: 11.0 x 5.8 x 7.4 cm = volume: 248.8 mL. No fibroids or other mass visualized. Endometrium Thickness: 19.1 mm.  No focal abnormality visualized. Right ovary Not visualized.  No adnexal mass. Left ovary Not visualized.  No adnexal mass. Other findings:  No abnormal free fluid. IMPRESSION: 1. Thickened endometrial stripe measuring up to 19.1 mm in thickness. Endometrial thickness is considered abnormal for an asymptomatic post-menopausal female. Endometrial sampling should be considered to exclude carcinoma. 2. Nonvisualization of the ovaries. No adnexal mass or abnormal free fluid within the pelvis. 3. No other acute abnormality. Electronically Signed   By: Jeannine Boga M.D.   On: 12/26/2019 21:13    Scheduled Meds: . [MAR Hold] amLODipine  10 mg Oral Daily  . [MAR Hold] calcium-vitamin D   Oral Daily  . [MAR Hold] clopidogrel  75 mg Oral Daily  . [MAR Hold] cyanocobalamin  1,000 mcg Intramuscular Q30 days  . [MAR Hold] heparin  5,000 Units Subcutaneous Q8H  . [MAR Hold] hydrocerin   Topical Daily  . [MAR Hold] isosorbide mononitrate  120 mg Oral Daily  . [MAR Hold] multivitamin  with minerals  1 tablet Oral Daily  . [MAR Hold] polyethylene glycol  17 g Oral BID  . [MAR Hold] senna-docusate  2 tablet Oral QHS   Continuous Infusions: . [MAR Hold] sodium chloride 250 mL (12/31/19 0737)  . [MAR Hold] cefTRIAXone (ROCEPHIN)  IV 2 g (01/01/20 1710)  . [MAR Hold] metronidazole 500 mg (01/02/20 0526)   PRN Meds:[MAR Hold] sodium chloride, [MAR Hold] acetaminophen, [MAR Hold] acetaminophen, [MAR Hold] polyethylene glycol, [MAR Hold] polyvinyl alcohol  Assessment/Plan: Principal Problem:   Sepsis  ([place]) Active Problems:   Hypertension   Cellulitis of left lower leg   AKI (acute kidney injury) ([place])   Chronic diastolic CHF (congestive heart failure) ([place])   Acute respiratory failure with hypoxia ([place])   Thickened endometrium  To OR today for removal of IUD and endometrial sampling.   LOS: 9 days   [name], MD 01/02/2020 2:21 PM

## 2020-01-02 NOTE — Transfer of Care (Signed)
Immediate Anesthesia Transfer of Care Note  Patient: Connie Miranda  Procedure(s) Performed: D&C WITH IUD REMOVAL (N/A )  Patient Location: PACU  Anesthesia Type:MAC  Level of Consciousness: awake, alert , oriented and patient cooperative  Airway & Oxygen Therapy: Patient Spontanous Breathing and Patient connected to face mask oxygen  Post-op Assessment: Report given to RN and Post -op Vital signs reviewed and stable  Post vital signs: Reviewed and stable  Last Vitals:  Vitals Value Taken Time  BP    Temp    Pulse    Resp    SpO2      Last Pain:  Vitals:   01/02/20 1402  TempSrc: Oral  PainSc:       Patients Stated Pain Goal: 3 ([id number])  Complications: No apparent anesthesia complications

## 2020-01-03 DIAGNOSIS — D638 Anemia in other chronic diseases classified elsewhere: Secondary | ICD-10-CM

## 2020-01-03 DIAGNOSIS — I890 Lymphedema, not elsewhere classified: Secondary | ICD-10-CM

## 2020-01-03 DIAGNOSIS — L120 Bullous pemphigoid: Secondary | ICD-10-CM

## 2020-01-03 DIAGNOSIS — C541 Malignant neoplasm of endometrium: Secondary | ICD-10-CM | POA: Diagnosis present

## 2020-01-03 LAB — CBC WITH DIFFERENTIAL/PLATELET
Abs Immature Granulocytes: 0.07 10*3/uL (ref 0.00–0.07)
Basophils Absolute: 0 10*3/uL (ref 0.0–0.1)
Basophils Relative: 0 %
Eosinophils Absolute: 0 10*3/uL (ref 0.0–0.5)
Eosinophils Relative: 0 %
HCT: 27.3 % — ABNORMAL LOW (ref 36.0–46.0)
Hemoglobin: 8.3 g/dL — ABNORMAL LOW (ref 12.0–15.0)
Immature Granulocytes: 1 %
Lymphocytes Relative: 10 %
Lymphs Abs: 0.8 10*3/uL (ref 0.7–4.0)
MCH: 29.4 pg (ref 26.0–34.0)
MCHC: 30.4 g/dL (ref 30.0–36.0)
MCV: 96.8 fL (ref 80.0–100.0)
Monocytes Absolute: 0.3 10*3/uL (ref 0.1–1.0)
Monocytes Relative: 4 %
Neutro Abs: 7.4 10*3/uL (ref 1.7–7.7)
Neutrophils Relative %: 85 %
Platelets: 471 10*3/uL — ABNORMAL HIGH (ref 150–400)
RBC: 2.82 MIL/uL — ABNORMAL LOW (ref 3.87–5.11)
RDW: 18.3 % — ABNORMAL HIGH (ref 11.5–15.5)
WBC: 8.6 10*3/uL (ref 4.0–10.5)
nRBC: 0 % (ref 0.0–0.2)

## 2020-01-03 LAB — BASIC METABOLIC PANEL
Anion gap: 6 (ref 5–15)
BUN: 8 mg/dL (ref 8–23)
CO2: 22 mmol/L (ref 22–32)
Calcium: 8.3 mg/dL — ABNORMAL LOW (ref 8.9–10.3)
Chloride: 109 mmol/L (ref 98–111)
Creatinine, Ser: 0.81 mg/dL (ref 0.44–1.00)
GFR calc Af Amer: >60 mL/min (ref >60)
GFR calc non Af Amer: >60 mL/min (ref >60)
Glucose, Bld: 183 mg/dL — ABNORMAL HIGH (ref 70–99)
Potassium: 3.8 mmol/L (ref 3.5–5.1)
Sodium: 137 mmol/L (ref 135–145)

## 2020-01-03 LAB — SURGICAL PATHOLOGY

## 2020-01-03 MED ORDER — ENOXAPARIN SODIUM 60 MG/0.6ML ~~LOC~~ SOLN
55.0000 mg | SUBCUTANEOUS | Status: DC
  Administered 2020-01-03: 55 mg via SUBCUTANEOUS
  Filled 2020-01-03: qty 0.6

## 2020-01-03 MED ORDER — HYDROCHLOROTHIAZIDE 25 MG PO TABS
25.0000 mg | ORAL_TABLET | Freq: Every day | ORAL | Status: DC
  Administered 2020-01-04: 25 mg via ORAL
  Filled 2020-01-03: qty 1

## 2020-01-03 MED ORDER — POTASSIUM CHLORIDE CRYS ER 20 MEQ PO TBCR
20.0000 meq | EXTENDED_RELEASE_TABLET | Freq: Every day | ORAL | Status: DC
  Administered 2020-01-03 – 2020-01-04 (×2): 20 meq via ORAL
  Filled 2020-01-03 (×2): qty 1

## 2020-01-03 NOTE — Progress Notes (Signed)
PHARMACY - LOVENOX  Lovenox per Pharmacy for DVT Prophylaxis  Pharmacy has been consulted from dosing enoxaparin (lovenox) in this patient for DVT prophylaxis.    Hgb: 8.3 Hct: 27.3 PLTC: 471 SCr: 0.81 Height: 5'3.5" Weight: 110 kg BMI: 42  Assessment:  BMI > 30 with normal renal function therefore will adjust dose per manufacturer's recommendation of 0.5 mg/kg/q24h for VTE prophylaxis.  Plan: Lovenox 55mg  sq q24h Will sign off.  Please re-consult if further assistance needed with this patient's care.  Thank you, Leone Haven, PharmD

## 2020-01-03 NOTE — Progress Notes (Signed)
PROGRESS NOTE  Connie Miranda VWU:[id number] DOB: August 23, 1933   PCP: [name], MD  Patient is from: Home.  She says she has 24/7 home care.  DOA: 12/24/2019 LOS: 10  Brief Narrative / Interim history: 86 year old female with morbid obesity, prior CVA, HTN, chronic anemia, G6PD deficiency,bilateral lower extremity lymphedema, pemphigus (according to patient),recent vulval abscess, dysphagia and debility/bedbound for the last 5 years brought to ED with altered mental status encephalopathy, fever to 104 and admitted for sepsis due to a lower extremity cellulitis.  CT abdomen and pelvis concerning for retained old IUD and possible endometritis and endometrial wall thickening.  Patient was started on broad-spectrum antibiotics.  GYN consulted.  Patient had D&C with removal of old IUD.  Pathology revealed Figo grade 2 endometrioid adenocarcinoma. Gynecology consulted.  Subjective: No major events overnight of this morning.  No complaint this morning.  Asking when she could go home.  Denies pain, dyspnea, nausea or vomiting.  She states she has 24/7 home care.   Objective: Vitals:   01/02/20 1618 01/02/20 2041 01/03/20 0514 01/03/20 1350  BP: (!) 176/69 (!) 151/70 (!) 146/56 (!) 137/115  Pulse: 83 90 89 97  Resp: 18 18 18 20   Temp: 97.6 F (36.4 C) 98.2 F (36.8 C) 98.1 F (36.7 C) 98.4 F (36.9 C)  TempSrc: Oral Oral Oral Oral  SpO2: 97% 97% 96% (!) 49%  Weight:      Height:        Intake/Output Summary (Last 24 hours) at 01/03/2020 1511 Last data filed at 01/02/2020 2314 Gross per 24 hour  Intake 1020 ml  Output --  Net 1020 ml   Filed Weights   12/24/19 1120 12/25/19 0509 12/26/19 2000  Weight: 124.7 kg 110 kg 110 kg    Examination:  GENERAL: No apparent distress.  Nontoxic. HEENT: MMM.  Vision and hearing grossly intact.  NECK: Supple.  No apparent JVD.  RESP:  No IWOB.  Fair aeration bilaterally but limited exam due to body habitus. CVS:  RRR. Heart sounds  normal.  ABD/GI/GU: Bowel sounds present. Soft. Non tender but limited exam due to body habitus. MSK/EXT: Bilateral lymphedema SKIN: Stasis dermatitis.  No apparent erythema or increased warmth to touch. NEURO: Awake, alert and oriented appropriately.  No apparent focal neuro deficit. PSYCH: Calm. Normal affect.  Procedures:  01/01/2019-D&C with removal of old IUD.  Biopsy reveals endometrioid adenocarcinoma.  Assessment & Plan: Sepsis due to lower extremity cellulitis in patient with significant lymphedema: presented with encephalopathy, fever or leukocytosis.  Has been on antibiotic for 10 days now.  No further fever or leukocytosis since first day of hospitalization. -Discontinue antibiotics-received different antibiotics from 12/26-1/5. -Supportive care  Acute metabolic encephalopathy: Resolved.  Likely due to the above.  CT head, MRI brain and ABG without significant finding. -Delirium precautions  Endometrioid adenocarcinoma, Figo grade 2: CT abdomen and pelvis and TVUS revealed old retained IUD and endometrial thickening to 19.1 mm.  -Underwent D & C with removal of old IUD and biopsy-that revealed endometrioid adenocarcinoma -GYN/Onc consulted.  History of bullous pemphigoid: Currently not on steroids or immunomodulators. -Outpatient follow-up with dermatology  Chronic diastolic CHF: No cardiopulmonary symptoms.  Difficult to assess fluid status due to body habitus and chronic lymphedema.  Reportedly not taking diuretics at home. -Monitor fluid status.  Essential hypertension: On amlodipine and Inderal at home. They  could make her lymphedema worse. -Stop amlodipine in the setting of significant lymphedema -Start HCTZ with daily KCl.  Continue Imdur for  now  History of CVA: Patient is bedbound for about 5 years now. -Continue home Plavix  Debility/physical deconditioning: Bedbound.  Has 24/7 home care. -PT/OT recommends SNF  Hypokalemia: Resolved.  Anemia of chronic  disease: H&H relatively stable. -Recheck in the morning. -Check anemia panel.  Morbid obesity: Difficult to entertain lifestyle change.  AKI: Resolved.  Constipation/fecal impaction: Resolved.                  DVT prophylaxis: Start subcu Lovenox Code Status: Full code Family Communication: Updated patient's daughter over the phone. Disposition Plan: Remains inpatient. Final disposition to be determined based on progress. Consultants: Gyn. Gyn/Onc   Microbiology summarized: COVID-19 negative. Urine culture negative. Blood cultures negative. MRSA PCR negative.  Sch Meds:  Scheduled Meds: . amLODipine  10 mg Oral Daily  . calcium-vitamin D   Oral Daily  . clopidogrel  75 mg Oral Daily  . cyanocobalamin  1,000 mcg Intramuscular Q30 days  . hydrocerin   Topical Daily  . isosorbide mononitrate  120 mg Oral Daily  . multivitamin with minerals  1 tablet Oral Daily  . polyethylene glycol  17 g Oral BID  . senna-docusate  2 tablet Oral QHS   Continuous Infusions: . sodium chloride 250 mL (01/03/20 0506)   PRN Meds:.sodium chloride, acetaminophen, acetaminophen, polyethylene glycol, polyvinyl alcohol  Antimicrobials: Anti-infectives (From admission, onward)   Start     Dose/Rate Route Frequency Ordered Stop   12/27/19 2200  metroNIDAZOLE (FLAGYL) IVPB 500 mg  Status:  Discontinued     500 mg 100 mL/hr over 60 Minutes Intravenous Every 8 hours 12/27/19 1502 01/03/20 1208   12/27/19 1600  cefTRIAXone (ROCEPHIN) 2 g in sodium chloride 0.9 % 100 mL IVPB  Status:  Discontinued     2 g 200 mL/hr over 30 Minutes Intravenous Every 24 hours 12/27/19 1502 01/03/20 1208   12/25/19 0600  metroNIDAZOLE (FLAGYL) IVPB 500 mg  Status:  Discontinued     500 mg 100 mL/hr over 60 Minutes Intravenous Every 8 hours 12/25/19 0450 12/27/19 1443   12/25/19 0500  ceFEPIme (MAXIPIME) 2 g in sodium chloride 0.9 % 100 mL IVPB  Status:  Discontinued     2 g 200 mL/hr over 30 Minutes  Intravenous  Once 12/25/19 0450 12/25/19 0454   12/25/19 0500  vancomycin (VANCOCIN) IVPB 1000 mg/200 mL premix  Status:  Discontinued     1,000 mg 200 mL/hr over 60 Minutes Intravenous  Once 12/25/19 0450 12/25/19 0453   12/24/19 2200  ceFEPIme (MAXIPIME) 2 g in sodium chloride 0.9 % 100 mL IVPB  Status:  Discontinued     2 g 200 mL/hr over 30 Minutes Intravenous Every 12 hours 12/24/19 1127 12/27/19 1435   12/24/19 2200  vancomycin (VANCOCIN) IVPB 1000 mg/200 mL premix  Status:  Discontinued     1,000 mg 200 mL/hr over 60 Minutes Intravenous Every 36 hours 12/24/19 1943 12/26/19 1450   12/24/19 1045  ceFEPIme (MAXIPIME) 2 g in sodium chloride 0.9 % 100 mL IVPB     2 g 200 mL/hr over 30 Minutes Intravenous  Once 12/24/19 1044 12/24/19 1137   12/24/19 1045  metroNIDAZOLE (FLAGYL) IVPB 500 mg     500 mg 100 mL/hr over 60 Minutes Intravenous  Once 12/24/19 1044 12/24/19 1453   12/24/19 1045  vancomycin (VANCOCIN) IVPB 1000 mg/200 mL premix     1,000 mg 200 mL/hr over 60 Minutes Intravenous  Once 12/24/19 1044 12/24/19 1209  I have personally reviewed the following labs and images: CBC: Recent Labs  Lab 12/29/19 0602 12/30/19 0536 01/01/20 0631 01/02/20 0544 01/03/20 0520  WBC 7.8 9.0 8.1 8.2 8.6  NEUTROABS  --   --   --  5.5 7.4  HGB 8.5* 9.8* 9.3* 8.8* 8.3*  HCT 27.4* 31.8* 30.7* 27.2* 27.3*  MCV 94.8 95.2 96.8 93.2 96.8  PLT 350 474* 498* 488* 471*   BMP &GFR Recent Labs  Lab 12/29/19 0602 12/30/19 0536 12/31/19 0505 01/01/20 0631 01/02/20 0544 01/03/20 0520  NA 140 141  --  142 141 137  K 3.1* 3.5  --  3.0* 3.1* 3.8  CL 111 111  --  111 113* 109  CO2 20* 21*  --  23 23 22   GLUCOSE 106* 108*  --  113* 106* 183*  BUN 17 12  --  7* 7* 8  CREATININE 0.92 0.94 0.74 0.63 0.73 0.81  CALCIUM 8.3* 8.7*  --  8.6* 8.3* 8.3*   Estimated Creatinine Clearance: 62.2 mL/min (by C-G formula based on SCr of 0.81 mg/dL). Liver & Pancreas: Recent Labs  Lab 12/30/19 0536   AST 62*  ALT 47*  ALKPHOS 119  BILITOT 0.8  PROT 7.2  ALBUMIN 2.7*   No results for input(s): LIPASE, AMYLASE in the last 168 hours. Recent Labs  Lab 12/29/19 1548 12/30/19 0536  AMMONIA 54* 36*   Diabetic: No results for input(s): HGBA1C in the last 72 hours. No results for input(s): GLUCAP in the last 168 hours. Cardiac Enzymes: No results for input(s): CKTOTAL, CKMB, CKMBINDEX, TROPONINI in the last 168 hours. No results for input(s): PROBNP in the last 8760 hours. Coagulation Profile: Recent Labs  Lab 12/28/19 0628  INR 1.3*   Thyroid Function Tests: No results for input(s): TSH, T4TOTAL, FREET4, T3FREE, THYROIDAB in the last 72 hours. Lipid Profile: No results for input(s): CHOL, HDL, LDLCALC, TRIG, CHOLHDL, LDLDIRECT in the last 72 hours. Anemia Panel: No results for input(s): VITAMINB12, FOLATE, FERRITIN, TIBC, IRON, RETICCTPCT in the last 72 hours. Urine analysis:    Component Value Date/Time   COLORURINE ORANGE (A) 12/24/2019 1030   APPEARANCEUR TURBID (A) 12/24/2019 1030   LABSPEC >1.030 (H) 12/24/2019 1030   PHURINE 5.0 12/24/2019 1030   GLUCOSEU NEGATIVE 12/24/2019 1030   HGBUR LARGE (A) 12/24/2019 1030   BILIRUBINUR MODERATE (A) 12/24/2019 1030   KETONESUR 15 (A) 12/24/2019 1030   PROTEINUR 100 (A) 12/24/2019 1030   UROBILINOGEN 1.0 04/09/2013 0151   NITRITE POSITIVE (A) 12/24/2019 1030   LEUKOCYTESUR TRACE (A) 12/24/2019 1030   Sepsis Labs: Invalid input(s): PROCALCITONIN, LACTICIDVEN  Microbiology: Recent Results (from the past 240 hour(s))  MRSA PCR Screening     Status: None   Collection Time: 12/25/19  7:04 PM   Specimen: Nasal Mucosa; Nasopharyngeal  Result Value Ref Range Status   MRSA by PCR NEGATIVE NEGATIVE Final    Comment:        The GeneXpert MRSA Assay (FDA approved for NASAL specimens only), is one component of a comprehensive MRSA colonization surveillance program. It is not intended to diagnose MRSA infection nor to  guide or monitor treatment for MRSA infections. Performed at [facility], [address] [place], [place] [address]     Radiology Studies: No results found.  35 minutes with more than 50% spent in reviewing records, counseling patient/family and coordinating care.   Aidenn Skellenger T. Kaiyla Stahly [facility]  If 7PM-7AM, please contact night-coverage [email or web address] Password [email or web address] 01/03/2020, 3:11  PM

## 2020-01-03 NOTE — Progress Notes (Signed)
Gynecologic Oncology Consultation  Connie Miranda 86 y.o. female  CC:  Chief Complaint  Patient presents with   Altered Mental Status    HPI: Connie Miranda is an 86 year old female who presented to the ER with altered mental status. Upon arrival, temperature of 103 was noted and GCS of 12 which prompted code sepsis to be initiated. Prior to this admission, she was seen in the ED on 12/15/2019 for blood noted in her diaper, foul smelling discharge.  A CT scan was peformed resulting 1. There is a possible thin 2.8 x 1 cm fluid collection in the right vulva. This may represent a small abscess in the appropriate clinical setting. 2. Marked thickening of the endometrial stripe with a loop IUD in the lower uterine segment. Ultrasound follow-up is recommended. 3. Sigmoid diverticulosis without CT evidence for diverticulitis. 4. Large amount of stool at the level of the rectum. Correlate for constipation/fecal impaction. 5. Diffuse osteopenia.  Her images was reviewed with GYN and it was recommended she be discharged with keflex with recommendation for follow with GYN given thickened endometrium noted on CT.  Before follow up could take place, she presented to the ED with altered mental status. A CT AP was performed resulting the previously measured right vaginal fluid collection at 2.8 x 1 point 0 cm is not clearly demonstrated on today's study. There does appear to be a fluid collection in the left labia majora measuring 4.3 x 1.7 cm, similar to prior study. No features on today's study to suggest necrotizing fasciitis.  2. Marked apparent thickening of the endometrium. Neoplasm not excluded. Pelvic ultrasound recommended to further evaluate. 3. Collapsed gallbladder. Gallstones documented on previous cholecystostomy tube injection are not visualized on today's CT. 4.  Aortic Atherosclerois.   On 01/02/2020, she was taken to the OR by Dr. Kennon Rounds, GYN for a dilation and curettage of the uterus and IUD removal.  Final pathology revealed a grade 2 endometrial cancer. The IUD was originally placed 50 years ago after childbirth with an attempt to remove it ten years after but attempt aborted after strings broke.    Currently she is admitted for sepsis with concern for cellulitis, metabolic encephalopathy, AKI, lower leg cellulitis.  She has an extensive medical history including HTN, chronic diastolic CHF, morbid obesity, prior CVA, chronic anemia, dysphagia, BLE lymphedema, bed bound, G6PD deficiency.  Interval History: She states she feels better today.  Tolerating diet with no nausea or emesis.  No pain reported. Reported vaginal bleeding earlier today when getting cleaned up.  Daughter at the bedside. She states her mother started having vaginal discharge 18 months ago when in a skilled nursing facility but was told that was normal at that time.   Review of Systems: Constitutional: Feels better. No recent fever.  Fever of 103 on admission. Appetite good prior to admission.  Cardiovascular: No chest pain, shortness of breath, or edema.  Pulmonary: No cough or wheeze.  Gastrointestinal: No nausea, vomiting, or diarrhea. No bright red blood per rectum or change in bowel movement.  Genitourinary: No frequency, urgency, or dysuria. Positive for vaginal bleeding. Had discharge from vaginal 18 months ago.  Musculoskeletal: Bedbound. Neurologic: Hx CVA-bedbound.  Cannot move her legs but they can be moved by others.  Psychology: No depression, anxiety, or insomnia.  Health Maintenance: Mammogram: 2011  Current Meds:  amLODipine  10 mg Oral Daily   calcium-vitamin D   Oral Daily   clopidogrel  75 mg Oral Daily   cyanocobalamin  1,000 mcg Intramuscular Q30 days   hydrocerin   Topical Daily   isosorbide mononitrate  120 mg Oral Daily   multivitamin with minerals  1 tablet Oral Daily   polyethylene glycol  17 g Oral BID   senna-docusate  2 tablet Oral QHS   Allergy:  Allergies  Allergen Reactions   Asa  [Aspirin] Other (See Comments)    Doctor told her not to take due to type anemia   Phosphate Other (See Comments)    Anything containing phosphate 6. Unknown reaction   Codeine Other (See Comments)    Made her too sleepy   Penicillins Itching and Swelling    Tolerates keflex  Did it involve swelling of the face/tongue/throat, SOB, or low BP? unknown Did it involve sudden or severe rash/hives, skin peeling, or any reaction on the inside of your mouth or nose? unknown Did you need to seek medical attention at a hospital or doctor's office? unknown When did it last happen?   years ago.    If all above answers are "NO", may proceed with cephalosporin use.    Sodium Phosphate     Social Hx:   Social History   Socioeconomic History   Marital status: Single    Spouse name: Not on file   Number of children: Not on file   Years of education: Not on file   Highest education level: Not on file  Occupational History   Not on file  Tobacco Use   Smoking status: Former Smoker    Types: Cigarettes    Quit date: 12/29/1993    Years since quitting: 26.0   Smokeless tobacco: Never Used  Substance and Sexual Activity   Alcohol use: No   Drug use: No   Sexual activity: Not on file  Other Topics Concern   Not on file  Social History Narrative   Live alone in house and uses a cane and wheelchair to get around.  She has not been upstairs in over a year.  She does not drive, but she uses a cab or bus to go to grocery store, but usually friends and family bring her things.  Niece and nephew in [place].     Social Determinants of Health   Financial Resource Strain:    Difficulty of Paying Living Expenses: Not on file  Food Insecurity:    Worried About Charity fundraiser in the Last Year: Not on file   [employer] of Food in the Last Year: Not on file  Transportation Needs:    Lack of Transportation (Medical): Not on file   Lack of Transportation (Non-Medical): Not on file  Physical Activity:     Days of Exercise per Week: Not on file   Minutes of Exercise per Session: Not on file  Stress:    Feeling of Stress : Not on file  Social Connections:    Frequency of Communication with Friends and Family: Not on file   Frequency of Social Gatherings with Friends and Family: Not on file   Attends Religious Services: Not on file   Active Member of Clubs or Organizations: Not on file   Attends Archivist Meetings: Not on file   Marital Status: Not on file  Intimate Partner Violence:    Fear of Current or Ex-Partner: Not on file   Emotionally Abused: Not on file   Physically Abused: Not on file   Sexually Abused: Not on file    Past Surgical Hx:  Past Surgical  History:  Procedure Laterality Date   BONE MARROW BIOPSY  12/1966   for anemia   IR PERC CHOLECYSTOSTOMY  04/08/2017   IR RADIOLOGIST EVAL & MGMT  05/20/2017   IR SINUS/FIST TUBE CHK-NON GI  06/11/2017   MULTIPLE TOOTH EXTRACTIONS     OTHER SURGICAL HISTORY     cyst from panty line removed as child    Past Medical Hx:  Past Medical History:  Diagnosis Date   Edema    Edema of both legs 01/2016   Glucose-6-phosphate dehydrogenase deficiency    possible diagnosis of G6PD per patient "phosphate 6 deficiency"   Hypertension    Osteoarthritis    Pernicious anemia    Stroke ([place]) 09/2000   couldn't move her left foot which resolved    Family Hx:  Family History  Problem Relation Age of Onset   Diabetes Brother    Diabetes Brother    Heart failure Mother 98   Heart attack Father 62    Vitals:  Blood pressure (!) 137/115, pulse 97, temperature 98.4 F (36.9 C), temperature source Oral, resp. rate 20, height 5' 3.5" (1.613 m), weight 242 lb 8.1 oz (110 kg), SpO2 (!) 49 %.  CBC    Component Value Date/Time   WBC 8.6 01/03/2020 0520   RBC 2.82 (L) 01/03/2020 0520   HGB 8.3 (L) 01/03/2020 0520   HCT 27.3 (L) 01/03/2020 0520   PLT 471 (H) 01/03/2020 0520   MCV 96.8 01/03/2020 0520   MCH 29.4 01/03/2020  0520   MCHC 30.4 01/03/2020 0520   RDW 18.3 (H) 01/03/2020 0520   LYMPHSABS 0.8 01/03/2020 0520   MONOABS 0.3 01/03/2020 0520   EOSABS 0.0 01/03/2020 0520   BASOSABS 0.0 01/03/2020 0520    BMET    Component Value Date/Time   NA 137 01/03/2020 0520   K 3.8 01/03/2020 0520   CL 109 01/03/2020 0520   CO2 22 01/03/2020 0520   GLUCOSE 183 (H) 01/03/2020 0520   BUN 8 01/03/2020 0520   CREATININE 0.81 01/03/2020 0520   CALCIUM 8.3 (L) 01/03/2020 0520   GFRNONAA >60 01/03/2020 0520   GFRAA >60 01/03/2020 0520     Physical Exam (performed :  General: Well developed, well nourished female in no acute distress. Alert and oriented x 3.  Cardiovascular: Regular rate and rhythm. S1 and S2 normal.  Lungs: Clear to auscultation bilaterally (slightly distant given body habitus). No wheezes/crackles/rhonchi noted.  Abdomen: Abdomen soft, non-tender and morbidly obese. Active bowel sounds in all quadrants.  Genitourinary: Deferred given recent procedure yesterday. Attempt will be made at office visit. Extremities: Hypopigmented BLE with gross edema.  Significant Venous stasis.  Assessment/Plan: 86 year old female currently admitted for concern for sepsis, cellulitis, metabolic encephalopathy, AKI with new diagnosis of grade 2 endometrial cancer from a dilation and curettage of the uterus on 01/02/2020. In depth discussion provided by Dr. Berline Lopes including treatment options.  Plan for follow up in the office in 3 weeks. Dr. Berline Lopes to talk with Dr. Sondra Come to see if patient would be candidate for curative intent radiation vs palliative. Not a surgical candidate due to multiple co-morbidities. Discussion around use of progesterone IUD vs oral progesterone with the decision that a Mirena IUD not be an option at this time given the current infection but could possibly be in the future if patient recovers adequately. Oral progesterone not recommended given potential side effects.    Dorothyann Gibbs,  NP 01/03/2020, 2:05 PM

## 2020-01-03 NOTE — Progress Notes (Signed)
Pathology reviewed. Called Daughter Genelle Gather. She has asked for a GYN/Onc consult. Called the office and asked for one of the GYN/ONC to make an in-person consult today.

## 2020-01-03 NOTE — Care Management Important Message (Signed)
Important Message  Patient Details IM Letter given to Central Case Manager to present to the Patient                                                     Name: Connie Miranda MRN: [id number] Date of Birth: [birth year]   Medicare Important Message Given:  Yes     Kerin Salen 01/03/2020, 10:58 AM

## 2020-01-03 NOTE — Evaluation (Signed)
Physical Therapy Evaluation Patient Details Name: Connie Miranda MRN: [id number] DOB: 04/28/1933 Today's Date: 01/03/2020   History of Present Illness  86 yo female admitted with AMS, sepsis. S/P IUD removal 01/02/20. Hx of bil LE edema, CHF, CVA-L hemiparesis, morbid obesity, anemia  Clinical Impression  Limited eval. Attempted PT eval this a.m. Not completely sure of pt's PLOF and home care situation. Pt gave conflicting info during session regarding her prior level of mobility/assistance required. Per chart review from previous admissions, pt has required extensive total care assistance for all mobility tasks for some time now. Pt very likely continues to require extensive assistance for all mobility tasks. She did report having a lift at home that "[name] uses sometimes." D/C plan depends on level of care pt has available at home and family decision. Pt may not be appropriate for PT services if she is currently at her baseline with respect to her mobility.     Follow Up Recommendations SNF;Supervision/Assistance - 24 hour(depending on family decision and care available at home)    Equipment Recommendations  None recommended by PT    Recommendations for Other Services       Precautions / Restrictions Precautions Precautions: Fall Restrictions Weight Bearing Restrictions: No      Mobility  Bed Mobility Overal bed mobility: Needs Assistance Bed Mobility: Rolling;Supine to Sit Rolling: Total assist;+2 for physical assistance;+2 for safety/equipment   Supine to sit: Total assist;+2 for physical assistance;+2 for safety/equipment;HOB elevated     General bed mobility comments: Pt is unable to mobilize without extensive assistance. Pt attempted multiple times but then she would stop.  Transfers                    Ambulation/Gait                Stairs            Wheelchair Mobility    Modified Rankin (Stroke Patients Only)       Balance                                             Pertinent Vitals/Pain Pain Assessment: Faces Faces Pain Scale: Hurts little more Pain Location: bil LEs when moving on putting socks on    Home Living Family/patient expects to be discharged to:: Private residence Living Arrangements: Alone Available Help at Discharge: Personal care attendant(aides 3 hours/day. Also has a person named "[name]" that stays with her) Type of Home: House Home Access: Ramped entrance     Home Layout: One level Home Equipment: Wheelchair - manual;Cane - single point;Walker - 2 wheels;Hospital bed(lift equipment)      Prior Function Level of Independence: Needs assistance   Gait / Transfers Assistance Needed: unsure of info provided by pt. On one hand, she says [name] uses a lift. On the other hand, she says she slides into a wheelchair. She also stated she "hasn't been able to sit up since 2000."   ADL's / Homemaking Assistance Needed: total assist        Hand Dominance        Extremity/Trunk Assessment   Upper Extremity Assessment Upper Extremity Assessment: (moves R UE well. L-Possible residual weakness from previous CVA)    Lower Extremity Assessment Lower Extremity Assessment: (Pt is unable to move LEs without assistance.)       Communication  Communication: No difficulties  Cognition                                              General Comments      Exercises     Assessment/Plan    PT Assessment Patient needs continued PT services  PT Problem List Decreased strength;Decreased mobility;Decreased balance       PT Treatment Interventions Functional mobility training;Therapeutic activities;Therapeutic exercise;Patient/family education;DME instruction;Wheelchair mobility training    PT Goals (Current goals can be found in the Care Plan section)  Acute Rehab PT Goals Patient Stated Goal: home PT Goal Formulation: With patient Time For Goal  Achievement: 01/17/20 Potential to Achieve Goals: Poor    Frequency Min 2X/week   Barriers to discharge        Co-evaluation               AM-PAC PT "6 Clicks" Mobility  Outcome Measure Help needed turning from your back to your side while in a flat bed without using bedrails?: Total Help needed moving from lying on your back to sitting on the side of a flat bed without using bedrails?: Total Help needed moving to and from a bed to a chair (including a wheelchair)?: Total Help needed standing up from a chair using your arms (e.g., wheelchair or bedside chair)?: Total Help needed to walk in hospital room?: Total Help needed climbing 3-5 steps with a railing? : Total 6 Click Score: 6    End of Session   Activity Tolerance: Patient tolerated treatment well Patient left: in bed;with call bell/phone within reach   PT Visit Diagnosis: Muscle weakness (generalized) (M62.81);Difficulty in walking, not elsewhere classified (R26.2);Hemiplegia and hemiparesis Hemiplegia - Right/Left: Left Hemiplegia - caused by: Cerebral infarction    Time: 0932-1003 PT Time Calculation (min) (ACUTE ONLY): 31 min   Charges:   PT Evaluation $PT Eval Moderate Complexity: 1 Mod            Iszabella Hebenstreit P, PT Acute Rehabilitation

## 2020-01-04 DIAGNOSIS — C541 Malignant neoplasm of endometrium: Secondary | ICD-10-CM

## 2020-01-04 DIAGNOSIS — I5032 Chronic diastolic (congestive) heart failure: Secondary | ICD-10-CM

## 2020-01-04 DIAGNOSIS — I10 Essential (primary) hypertension: Secondary | ICD-10-CM

## 2020-01-04 LAB — CBC WITH DIFFERENTIAL/PLATELET
Abs Immature Granulocytes: 0.07 10*3/uL (ref 0.00–0.07)
Basophils Absolute: 0 10*3/uL (ref 0.0–0.1)
Basophils Relative: 0 %
Eosinophils Absolute: 0.2 10*3/uL (ref 0.0–0.5)
Eosinophils Relative: 2 %
HCT: 28.9 % — ABNORMAL LOW (ref 36.0–46.0)
Hemoglobin: 8.7 g/dL — ABNORMAL LOW (ref 12.0–15.0)
Immature Granulocytes: 1 %
Lymphocytes Relative: 18 %
Lymphs Abs: 1.6 10*3/uL (ref 0.7–4.0)
MCH: 29.9 pg (ref 26.0–34.0)
MCHC: 30.1 g/dL (ref 30.0–36.0)
MCV: 99.3 fL (ref 80.0–100.0)
Monocytes Absolute: 0.8 10*3/uL (ref 0.1–1.0)
Monocytes Relative: 9 %
Neutro Abs: 6.2 10*3/uL (ref 1.7–7.7)
Neutrophils Relative %: 70 %
Platelets: 452 10*3/uL — ABNORMAL HIGH (ref 150–400)
RBC: 2.91 MIL/uL — ABNORMAL LOW (ref 3.87–5.11)
RDW: 19.5 % — ABNORMAL HIGH (ref 11.5–15.5)
WBC: 8.8 10*3/uL (ref 4.0–10.5)
nRBC: 0 % (ref 0.0–0.2)

## 2020-01-04 MED ORDER — SENNOSIDES-DOCUSATE SODIUM 8.6-50 MG PO TABS: 1.0000 | ORAL_TABLET | Freq: Every day | ORAL | 0 refills | Status: AC

## 2020-01-04 NOTE — TOC Transition Note (Signed)
Transition of Care [facility]) - CM/SW Discharge Note   Patient Details  Name: TRAVINA LAKEY MRN: [id number] Date of Birth: 06/15/1933  [facility] CM/SW Contact:  Lia Hopping, [place] Phone Number: 01/04/2020, 11:59 AM   Clinical Narrative:    PTAR arranged for 3:00pm pick up.   Final next level of care: Home/Self Care Barriers to Discharge: No Barriers Identified   Patient Goals and CMS Choice Patient states their goals for this hospitalization and ongoing recovery are:: Per daughter "continue personal care at home"   Choice offered to / list presented to : NA  Discharge Placement                       Discharge Plan and Services In-house Referral: Clinical Social Work Discharge Planning Services: CM Consult                                 Social Determinants of Health (SDOH) Interventions     Readmission Risk Interventions No flowsheet data found.

## 2020-01-04 NOTE — Discharge Summary (Signed)
Discharge Summary  Connie Miranda [id number] DOB: 06-Dec-1933  PCP: [name], MD  Admit date: 12/24/2019 Discharge date: 01/04/2020  Time spent: 45 mins  Recommendations for Outpatient Follow-up:  1. PCP in 1 week 2. OB/GYN as scheduled  Discharge Diagnoses:  Active Hospital Problems   Diagnosis Date Noted  . Sepsis ([place]) 06/10/2018  . Endometrial carcinoma ([place]) 01/03/2020  . Thickened endometrium   . Acute respiratory failure with hypoxia ([place]) 06/10/2018  . Chronic diastolic CHF (congestive heart failure) ([place]) 06/10/2018  . AKI (acute kidney injury) ([place]) 01/28/2013  . Cellulitis of left lower leg 10/12/2012  . Hypertension     Resolved Hospital Problems  No resolved problems to display.    Discharge Condition: Stable  Diet recommendation: Heart healthy  Vitals:   01/03/20 2112 01/04/20 0459  BP: 135/60 (!) 141/60  Pulse: 85 77  Resp: 20 16  Temp: 98.6 F (37 C) 98.6 F (37 C)  SpO2: 95% 99%    History of present illness:  86 year old female with morbid obesity, prior CVA, HTN, chronic anemia, G6PD deficiency,bilateral lower extremity lymphedema, pemphigus (according to patient),recent vulval abscess, dysphagia and debility/bedbound for the last 5 years brought to ED with altered mental status encephalopathy, fever to 104 and admitted for sepsis due to a lower extremity cellulitis.  CT abdomen and pelvis concerning for retained old IUD and possible endometritis and endometrial wall thickening.  Patient was started on broad-spectrum antibiotics.  GYN consulted. Patient had D&C with removal of old IUD.  Pathology revealed Figo grade 2 endometrioid adenocarcinoma. Gynecology consulted.    Today, patient denies any new complaints, denies any fever/chills, abdominal pain, chest pain, shortness of breath, cough, vaginal bleeding.  Discussed extensively with patient's daughter about discharge plans.  Social worker on board.  Plan to DC home as daughter has  arranged for 24-hour care.  Hospital Course:  Principal Problem:   Sepsis ([place]) Active Problems:   Hypertension   Cellulitis of left lower leg   AKI (acute kidney injury) ([place])   Chronic diastolic CHF (congestive heart failure) ([facility])   Acute respiratory failure with hypoxia ([facility])   Thickened endometrium   Endometrial carcinoma ([facility])  Sepsis due to lower extremity cellulitis in patient with significant lymphedema Presented with encephalopathy, fever or leukocytosis Currently afebrile, with no leukocytosis Status post 10 days of antibiotics Follow-up with PCP  Acute metabolic encephalopathy Resolved.  Likely due to the above CT head, MRI brain and ABG without significant finding Follow-up with PCP  Endometrioid adenocarcinoma, Figo grade 2 CT abdomen and pelvis and TVUS revealed old retained IUD and endometrial thickening to 19.1 mm Underwent D & C with removal of old IUD and biopsy-that revealed endometrioid adenocarcinoma GYN/Onc consulted, follow-up as an outpatient to discuss further management  History of bullous pemphigoid Currently not on steroids or immunomodulators Outpatient follow-up with dermatology  Chronic diastolic CHF Difficult to assess fluid status due to body habitus and chronic lymphedema Not taking diuretics at home  Anemia of chronic disease Hemoglobin relatively stable No evidence of bleeding Follow-up with PCP  Essential hypertension Continue home amlodipine, Imdur  History of CVA Patient is bedbound for over 5 years Continue home Plavix  Debility/physical deconditioning Bedbound.  Has 24/7 home care  Morbid obesity Lifestyle modification advised         Malnutrition Type:      Malnutrition Characteristics:      Nutrition Interventions:      Estimated body mass index is 42.28 kg/m as calculated from  the following:   Height as of this encounter: 5' 3.5" (1.613 m).   Weight as of this encounter: 110 kg.     Procedures:  D&C for IUD removal and biopsy  Consultations:  OB/GYN  Discharge Exam: BP (!) 141/60 (BP Location: Right Wrist)   Pulse 77   Temp 98.6 F (37 C) (Oral)   Resp 16   Ht 5' 3.5" (1.613 m)   Wt 110 kg   SpO2 99%   BMI 42.28 kg/m   General: NAD Cardiovascular: S1-S2 present Respiratory: Diminished breath sounds bilaterally likely due to body habitus Extremities: Bilateral lymphedema, stasis dermatitis  Discharge Instructions You were cared for by a hospitalist during your hospital stay. If you have any questions about your discharge medications or the care you received while you were in the hospital after you are discharged, you can call the unit and asked to speak with the hospitalist on call if the hospitalist that took care of you is not available. Once you are discharged, your primary care physician will handle any further medical issues. Please note that NO REFILLS for any discharge medications will be authorized once you are discharged, as it is imperative that you return to your primary care physician (or establish a relationship with a primary care physician if you do not have one) for your aftercare needs so that they can reassess your need for medications and monitor your lab values.  Discharge Instructions    Diet - low sodium heart healthy   Complete by: As directed    Increase activity slowly   Complete by: As directed      Allergies as of 01/04/2020      Reactions   Asa [aspirin] Other (See Comments)   Doctor told her not to take due to type anemia   Phosphate Other (See Comments)   Anything containing phosphate 6. Unknown reaction   Codeine Other (See Comments)   Made her too sleepy   Penicillins Itching, Swelling   Tolerates keflex Did it involve swelling of the face/tongue/throat, SOB, or low BP? unknown Did it involve sudden or severe rash/hives, skin peeling, or any reaction on the inside of your mouth or nose? unknown Did you need to  seek medical attention at a hospital or doctor's office? unknown When did it last happen?years ago. If all above answers are "NO", may proceed with cephalosporin use.   Sodium Phosphate       Medication List    TAKE these medications   acetaminophen 500 MG tablet Commonly known as: TYLENOL Take 500 mg by mouth every 6 (six) hours as needed for moderate pain. What changed: Another medication with the same name was removed. Continue taking this medication, and follow the directions you see here.   amLODipine 10 MG tablet Commonly known as: NORVASC Take 1 tablet (10 mg total) by mouth daily.   CALCIUM + D PO Take 1 tablet by mouth daily.   clopidogrel 75 MG tablet Commonly known as: PLAVIX Take 1 tablet (75 mg total) by mouth daily.   cyanocobalamin 1000 MCG/ML injection Commonly known as: (VITAMIN B-12) Inject 1 mL (1,000 mcg total) into the muscle every 30 (thirty) days.   isosorbide mononitrate 120 MG 24 hr tablet Commonly known as: IMDUR Take 120 mg by mouth daily.   mineral oil-hydrophilic petrolatum ointment Apply 1 application topically daily. To lower extremities   multivitamin with minerals Tabs tablet Take 1 tablet by mouth daily.   polyethylene glycol 17 g packet Commonly  known as: MIRALAX / GLYCOLAX Take 17 g by mouth daily. What changed:   when to take this  reasons to take this   senna-docusate 8.6-50 MG tablet Commonly known as: Senokot-S Take 1 tablet by mouth at bedtime.      Allergies  Allergen Reactions  . Asa [Aspirin] Other (See Comments)    Doctor told her not to take due to type anemia  . Phosphate Other (See Comments)    Anything containing phosphate 6. Unknown reaction  . Codeine Other (See Comments)    Made her too sleepy  . Penicillins Itching and Swelling    Tolerates keflex  Did it involve swelling of the face/tongue/throat, SOB, or low BP? unknown Did it involve sudden or severe rash/hives, skin peeling, or any  reaction on the inside of your mouth or nose? unknown Did you need to seek medical attention at a hospital or doctor's office? unknown When did it last happen?years ago. If all above answers are "NO", may proceed with cephalosporin use.   . Sodium Phosphate    Follow-up [facility] In 2 weeks.   Specialty: Obstetrics and Gynecology Why: postop check Contact information: [address] [place], [place] [id number] [place] [phone]       [name], MD. Schedule an appointment as soon as possible for a visit in 1 week(s).   Specialty: Internal Medicine Contact information: [place]  [phone]            The results of significant diagnostics from this hospitalization (including imaging, microbiology, ancillary and laboratory) are listed below for reference.    Significant Diagnostic Studies: CT ABDOMEN PELVIS WO CONTRAST  Result Date: 12/24/2019 CLINICAL DATA:  History of vaginal abscess. EXAM: CT ABDOMEN AND PELVIS WITHOUT CONTRAST TECHNIQUE: Multidetector CT imaging of the abdomen and pelvis was performed following the standard protocol without IV contrast. COMPARISON:  12/15/2019 FINDINGS: Lower chest: Unremarkable. Hepatobiliary: No focal abnormality in the liver on this study without intravenous contrast. Gallbladder is collapsed. Gallstones demonstrated by cholangiogram on 06/11/2017 are not evident on this CT. No intrahepatic or extrahepatic biliary dilation. Pancreas: No focal mass lesion. No dilatation of the main duct. No intraparenchymal cyst. No peripancreatic edema. Spleen: No splenomegaly. No focal mass lesion. Adrenals/Urinary Tract: No adrenal nodule or mass. Kidneys unremarkable. No evidence for hydroureter. Bladder decompressed. Stomach/Bowel: Stomach is unremarkable. No gastric wall thickening. No evidence of outlet obstruction.  Duodenum is normally positioned as is the ligament of Treitz. No small bowel wall thickening. No small bowel dilatation. The terminal ileum is normal. The appendix is normal. Diverticular changes are noted in the left colon without evidence of diverticulitis. Marked stool volume noted in the rectum with rectal diameter measuring 12.8 x 9.3 cm, similar to prior. No substantial perirectal edema or inflammation at this time. Vascular/Lymphatic: There is abdominal aortic atherosclerosis without aneurysm. There is no gastrohepatic or hepatoduodenal ligament lymphadenopathy. No retroperitoneal or mesenteric lymphadenopathy. No pelvic sidewall lymphadenopathy. Reproductive: IUD visualized in the uterus. Uterus appears thickened and prior study with contrast material demonstrated marked prominence of the endometrium. There is no adnexal mass. The right-sided vaginal fluid collection measured on the previous study appears decreased. There also appears to be a relatively stable fluid collection involving the left labia majora. This is a focal abnormality and there is no edema or inflammation in the soft tissues of the perineum. There is no gas in the soft tissues  of the perineum. Other: No intraperitoneal free fluid. Musculoskeletal: Bones are diffusely demineralized. No worrisome lytic or sclerotic osseous abnormality. IMPRESSION: 1. The previously measured right vaginal fluid collection at 2.8 x 1 point 0 cm is not clearly demonstrated on today's study. There does appear to be a fluid collection in the left labia majora measuring 4.3 x 1.7 cm, similar to prior study. No features on today's study to suggest necrotizing fasciitis. 2. Marked apparent thickening of the endometrium. Neoplasm not excluded. Pelvic ultrasound recommended to further evaluate. 3. Collapsed gallbladder. Gallstones documented on previous cholecystostomy tube injection are not visualized on today's CT. 4.  Aortic Atherosclerois (ICD10-170.0)  Electronically Signed   By: Misty Stanley M.D.   On: 12/24/2019 12:44   DG Chest 2 View  Result Date: 12/24/2019 CLINICAL DATA:  Altered mental status, shortness of breath. EXAM: CHEST - 2 VIEW COMPARISON:  None. FINDINGS: The heart size and mediastinal contours are within normal limits. Both lungs are clear. The visualized skeletal structures are stable. IMPRESSION: No active cardiopulmonary disease. Electronically Signed   By: Abelardo Diesel M.D.   On: 12/24/2019 12:02   CT HEAD WO CONTRAST  Result Date: 12/27/2019 CLINICAL DATA:  Altered mental status EXAM: CT HEAD WITHOUT CONTRAST TECHNIQUE: Contiguous axial images were obtained from the base of the skull through the vertex without intravenous contrast. COMPARISON:  CT head dated 03/24/2019 FINDINGS: Brain: No evidence of acute infarction, hemorrhage, hydrocephalus, extra-axial collection or mass lesion/mass effect. There is moderate cerebral volume loss with associated ex vacuo dilatation. Periventricular white matter hypoattenuation likely represents chronic small vessel ischemic disease. Vascular: There are vascular calcifications in the carotid siphons. Skull: Normal. Negative for fracture or focal lesion. Sinuses/Orbits: No acute finding. Other: None. IMPRESSION: 1. No acute intracranial process. Electronically Signed   By: Zerita Boers M.D.   On: 12/27/2019 10:42   CT PELVIS W CONTRAST  Result Date: 12/15/2019 CLINICAL DATA:  Lymphadenopathy with rectal bleeding and possible inguinal abscess. EXAM: CT PELVIS WITH CONTRAST TECHNIQUE: Multidetector CT imaging of the pelvis was performed using the standard protocol following the bolus administration of intravenous contrast. CONTRAST:  187mL OMNIPAQUE IOHEXOL 300 MG/ML  SOLN COMPARISON:  CT of the pelvis dated April 06, 2017. FINDINGS: Urinary Tract:  No abnormality visualized. Bowel: There is a very large amount of stool at the level of the rectum. There is sigmoid diverticulosis without CT  evidence for diverticulitis. The remaining small bowel is unremarkable. The appendix is unremarkable. Vascular/Lymphatic: There are atherosclerotic changes involving the abdominal aorta and the common iliac vasculature. Reproductive: There is diffuse enlargement of the uterus with extensive thickening of the endometrial stripe. There is a probable small to moderate volume of free fluid in the patient's endometrial canal. A loop IUD is noted in the lower uterine segment. There is a small amount of fluid in the vaginal vault. The ovaries appeared grossly unremarkable where visualized. Other: There is a possible thin 2.8 by 1 cm fluid collection in the right vulva. There is mild surrounding fat stranding. There are no pathologically enlarged inguinal lymph nodes. No pathologically enlarged pelvic lymph nodes. Musculoskeletal: There are advanced degenerative changes at the L5-S1 level. There is diffuse osteopenia which limits detection of nondisplaced fractures. IMPRESSION: 1. There is a possible thin 2.8 x 1 cm fluid collection in the right vulva. This may represent a small abscess in the appropriate clinical setting. 2. Marked thickening of the endometrial stripe with a loop IUD in the lower uterine segment. Ultrasound follow-up  is recommended. 3. Sigmoid diverticulosis without CT evidence for diverticulitis. 4. Large amount of stool at the level of the rectum. Correlate for constipation/fecal impaction. 5. Diffuse osteopenia. Aortic Atherosclerosis (ICD10-I70.0). Electronically Signed   By: Constance Holster M.D.   On: 12/15/2019 19:59   MR BRAIN WO CONTRAST  Result Date: 12/29/2019 CLINICAL DATA:  Sudden onset encephalopathy EXAM: MRI HEAD WITHOUT CONTRAST TECHNIQUE: Multiplanar, multiecho pulse sequences of the brain and surrounding structures were obtained without intravenous contrast. COMPARISON:  MRI head 04/10/2013 FINDINGS: Brain: Moderate atrophy and ventricular enlargement. Mild chronic white matter  changes similar to the prior study. No acute infarct, hemorrhage, mass. No fluid collection or midline shift. Vascular: Normal arterial flow voids. Skull and upper cervical spine: Negative Sinuses/Orbits: Mild mucosal edema paranasal sinuses. No orbital mass. Other: None IMPRESSION: Moderate atrophy with mild chronic microvascular ischemic change in the white matter No acute intracranial abnormality. Electronically Signed   By: Franchot Gallo M.D.   On: 12/29/2019 14:12   US PELVIS (TRANSABDOMINAL ONLY)  Result Date: 12/26/2019 CLINICAL DATA:  Initial evaluation for thickened endometrium. EXAM: TRANSABDOMINAL ULTRASOUND OF PELVIS TECHNIQUE: Transabdominal ultrasound examination of the pelvis was performed including evaluation of the uterus, ovaries, adnexal regions, and pelvic cul-de-sac. COMPARISON:  Prior CT from 12/24/2019. FINDINGS: Uterus Measurements: 11.0 x 5.8 x 7.4 cm = volume: 248.8 mL. No fibroids or other mass visualized. Endometrium Thickness: 19.1 mm.  No focal abnormality visualized. Right ovary Not visualized.  No adnexal mass. Left ovary Not visualized.  No adnexal mass. Other findings:  No abnormal free fluid. IMPRESSION: 1. Thickened endometrial stripe measuring up to 19.1 mm in thickness. Endometrial thickness is considered abnormal for an asymptomatic post-menopausal female. Endometrial sampling should be considered to exclude carcinoma. 2. Nonvisualization of the ovaries. No adnexal mass or abnormal free fluid within the pelvis. 3. No other acute abnormality. Electronically Signed   By: Jeannine Boga M.D.   On: 12/26/2019 21:13    Microbiology: Recent Results (from the past 240 hour(s))  MRSA PCR Screening     Status: None   Collection Time: 12/25/19  7:04 PM   Specimen: Nasal Mucosa; Nasopharyngeal  Result Value Ref Range Status   MRSA by PCR NEGATIVE NEGATIVE Final    Comment:        The GeneXpert MRSA Assay (FDA approved for NASAL specimens only), is one component of  a comprehensive MRSA colonization surveillance program. It is not intended to diagnose MRSA infection nor to guide or monitor treatment for MRSA infections. Performed at [facility], [place] [address] [place], [place] [address]      Labs: Basic Metabolic Panel: Recent Labs  Lab 12/29/19 0602 12/30/19 0536 12/31/19 0505 01/01/20 0631 01/02/20 0544 01/03/20 0520  NA 140 141  --  142 141 137  K 3.1* 3.5  --  3.0* 3.1* 3.8  CL 111 111  --  111 113* 109  CO2 20* 21*  --  23 23 22   GLUCOSE 106* 108*  --  113* 106* 183*  BUN 17 12  --  7* 7* 8  CREATININE 0.92 0.94 0.74 0.63 0.73 0.81  CALCIUM 8.3* 8.7*  --  8.6* 8.3* 8.3*   Liver Function Tests: Recent Labs  Lab 12/30/19 0536  AST 62*  ALT 47*  ALKPHOS 119  BILITOT 0.8  PROT 7.2  ALBUMIN 2.7*   No results for input(s): LIPASE, AMYLASE in the last 168 hours. Recent Labs  Lab 12/29/19 1548 12/30/19 0536  AMMONIA 54*  36*   CBC: Recent Labs  Lab 12/30/19 0536 01/01/20 0631 01/02/20 0544 01/03/20 0520 01/04/20 0525  WBC 9.0 8.1 8.2 8.6 8.8  NEUTROABS  --   --  5.5 7.4 6.2  HGB 9.8* 9.3* 8.8* 8.3* 8.7*  HCT 31.8* 30.7* 27.2* 27.3* 28.9*  MCV 95.2 96.8 93.2 96.8 99.3  PLT 474* 498* 488* 471* 452*   Cardiac Enzymes: No results for input(s): CKTOTAL, CKMB, CKMBINDEX, TROPONINI in the last 168 hours. BNP: BNP (last 3 results) No results for input(s): BNP in the last 8760 hours.  ProBNP (last 3 results) No results for input(s): PROBNP in the last 8760 hours.  CBG: No results for input(s): GLUCAP in the last 168 hours.     Signed:  Alma Friendly, MD [facility] 01/04/2020, 11:53 AM

## 2020-01-04 NOTE — Progress Notes (Signed)
Patient is being discharged home via [facility] for transport. Transport scheduled at 3pm per SW. Discharge instructions including follow up appointments and medications provided. Pt had no further questions at this time.

## 2020-01-04 NOTE — TOC Initial Note (Signed)
Transition of Care [facility]) - Initial/Assessment Note    Patient Details  Name: Connie Miranda MRN: [id number] Date of Birth: Aug 12, 1933  Transition of Care [facility]) CM/SW Contact:    Lia Hopping, [place] Phone Number: 01/04/2020, 11:43 AM  Clinical Narrative:                 CSW reached out to the patient daughter Ms. Phillip Heal to confirm 24/7 caregivers will be in place for the patient. Daughter reports the patient is currently receiving care Tuesday, Thursday and Friday 11:00am-5pm from [employer]. Staff provide light house work and assist with hygiene care.  During the daytime a family friend prepare meals and provide personal care 6pm-10am. Daughter reports plans to stay with the patient for the next two weeks. She is actively working on personal care services to fill in gaps of time when the patient is usually alone.  CSW informed daughter to do local search of PCS via google and senior care website. Daughter reports understanding  Patient will transport by [facility] today, daughter will be home to accept the patient today around 3:00pm today.  Physician and Nurse aware.   Expected Discharge Plan: Home/Self Care Barriers to Discharge: No Barriers Identified   Patient Goals and CMS Choice Patient states their goals for this hospitalization and ongoing recovery are:: Per daughter "continue personal care at home"   Choice offered to / list presented to : NA  Expected Discharge Plan and Services Expected Discharge Plan: Home/Self Care In-house Referral: Clinical Social Work Discharge Planning Services: CM Consult   Living arrangements for the past 2 months: Single Family Home                                      Prior Living Arrangements/Services Living arrangements for the past 2 months: Single Family Home Lives with:: Self Patient language and need for interpreter reviewed:: No Do you feel safe going back to the place where you live?: Yes      Need for Family  Participation in Patient Care: Yes (Comment) Care giver support system in place?: Yes (comment)   Criminal Activity/Legal Involvement Pertinent to Current Situation/Hospitalization: No - Comment as needed  Activities of Daily Living Home Assistive Devices/Equipment: Wheelchair ADL Screening (condition at time of admission) Patient's cognitive ability adequate to safely complete daily activities?: No Is the patient deaf or have difficulty hearing?: No Does the patient have difficulty seeing, even when wearing glasses/contacts?: Yes Does the patient have difficulty concentrating, remembering, or making decisions?: Yes Patient able to express need for assistance with ADLs?: Yes Does the patient have difficulty dressing or bathing?: Yes Independently performs ADLs?: No Communication: Independent Dressing (OT): Dependent Is this a change from baseline?: Pre-admission baseline Grooming: Dependent Is this a change from baseline?: Pre-admission baseline Feeding: Dependent Is this a change from baseline?: Pre-admission baseline Bathing: Dependent Is this a change from baseline?: Pre-admission baseline Toileting: Needs assistance Is this a change from baseline?: Pre-admission baseline In/Out Bed: Dependent Is this a change from baseline?: Pre-admission baseline Walks in Home: Dependent Is this a change from baseline?: Pre-admission baseline Does the patient have difficulty walking or climbing stairs?: Yes Weakness of Legs: Both Weakness of Arms/Hands: Both  Permission Sought/Granted   Permission granted to share information with : Yes, Verbal Permission Granted  Share Information with NAME: Graham,[name]     Permission granted to share info w Relationship: Daughter  Permission granted to share info w Contact Information: [phone], [phone]  Emotional Assessment Appearance:: Appears older than stated age Attitude/Demeanor/Rapport: Engaged Affect (typically observed):  Calm Orientation: : Oriented to Self, Oriented to Place, Oriented to  Time, Oriented to Situation Alcohol / Substance Use: Not Applicable Psych Involvement: No (comment)  Admission diagnosis:  SIRS (systemic inflammatory response syndrome) ([place]) [R65.10] Sepsis ([place]) [A41.9] Patient Active Problem List   Diagnosis Date Noted  . Endometrial carcinoma ([place]) 01/03/2020  . Thickened endometrium   . Shock circulatory ([place]) 12/24/2019  . Chronic diastolic CHF (congestive heart failure) ([place]) 06/10/2018  . Sepsis ([place]) 06/10/2018  . Acute respiratory failure with hypoxia ([place]) 06/10/2018  . Hypokalemia 06/10/2018  . Acute bronchitis 06/10/2018  . Lower extremity weakness 02/11/2016  . Difficulty walking 04/08/2013  . Weakness of both legs 04/08/2013  . Morbid obesity ([place]) 04/08/2013  . AKI (acute kidney injury) ([place]) 01/28/2013  . Hyperkalemia 01/28/2013  . Fall 01/28/2013  . Nausea 01/28/2013  . Lymphedema 10/16/2012  . Cellulitis 10/16/2012  . Diarrhea 10/13/2012  . Cellulitis of left lower leg 10/12/2012  . Anemia 10/12/2012  . Abdominal pain 10/12/2012  . Hip pain 10/12/2012  . Hypertension   . Edema   . Pernicious anemia   . Glucose-6-phosphate dehydrogenase deficiency   . Osteoarthritis   . Stroke [facility]) 09/28/2000   PCP:  [name], MD Pharmacy:   [facility], [address] [place] [address] Phone: [phone] Fax: [phone]     Social Determinants of Health (SDOH) Interventions    Readmission Risk Interventions No flowsheet data found.

## 2020-01-05 ENCOUNTER — Emergency Department (HOSPITAL_COMMUNITY): Payer: Medicare Other

## 2020-01-05 ENCOUNTER — Inpatient Hospital Stay (HOSPITAL_COMMUNITY)
Admission: EM | Admit: 2020-01-05 | Discharge: 2020-01-12 | DRG: 871 | Disposition: A | Discharge status: Skilled Nursing Facility | Payer: Medicare Other | Attending: Internal Medicine | Admitting: Internal Medicine

## 2020-01-05 ENCOUNTER — Other Ambulatory Visit: Payer: Self-pay

## 2020-01-05 DIAGNOSIS — Z8249 Family history of ischemic heart disease and other diseases of the circulatory system: Secondary | ICD-10-CM

## 2020-01-05 DIAGNOSIS — K5900 Constipation, unspecified: Secondary | ICD-10-CM | POA: Diagnosis present

## 2020-01-05 DIAGNOSIS — Z79899 Other long term (current) drug therapy: Secondary | ICD-10-CM

## 2020-01-05 DIAGNOSIS — I878 Other specified disorders of veins: Secondary | ICD-10-CM | POA: Diagnosis present

## 2020-01-05 DIAGNOSIS — I890 Lymphedema, not elsewhere classified: Secondary | ICD-10-CM | POA: Diagnosis present

## 2020-01-05 DIAGNOSIS — D75A Glucose-6-phosphate dehydrogenase (G6PD) deficiency without anemia: Secondary | ICD-10-CM | POA: Diagnosis present

## 2020-01-05 DIAGNOSIS — Z1621 Resistance to vancomycin: Secondary | ICD-10-CM | POA: Diagnosis present

## 2020-01-05 DIAGNOSIS — Z66 Do not resuscitate: Secondary | ICD-10-CM | POA: Diagnosis not present

## 2020-01-05 DIAGNOSIS — R6520 Severe sepsis without septic shock: Secondary | ICD-10-CM

## 2020-01-05 DIAGNOSIS — A4181 Sepsis due to Enterococcus: Secondary | ICD-10-CM | POA: Diagnosis not present

## 2020-01-05 DIAGNOSIS — A419 Sepsis, unspecified organism: Secondary | ICD-10-CM | POA: Diagnosis present

## 2020-01-05 DIAGNOSIS — I10 Essential (primary) hypertension: Secondary | ICD-10-CM | POA: Diagnosis not present

## 2020-01-05 DIAGNOSIS — N390 Urinary tract infection, site not specified: Secondary | ICD-10-CM | POA: Diagnosis not present

## 2020-01-05 DIAGNOSIS — I110 Hypertensive heart disease with heart failure: Secondary | ICD-10-CM | POA: Diagnosis present

## 2020-01-05 DIAGNOSIS — C541 Malignant neoplasm of endometrium: Secondary | ICD-10-CM | POA: Diagnosis present

## 2020-01-05 DIAGNOSIS — R5381 Other malaise: Secondary | ICD-10-CM | POA: Diagnosis present

## 2020-01-05 DIAGNOSIS — Z7902 Long term (current) use of antithrombotics/antiplatelets: Secondary | ICD-10-CM | POA: Diagnosis not present

## 2020-01-05 DIAGNOSIS — Z87891 Personal history of nicotine dependence: Secondary | ICD-10-CM

## 2020-01-05 DIAGNOSIS — E872 Acidosis: Secondary | ICD-10-CM | POA: Diagnosis present

## 2020-01-05 DIAGNOSIS — I639 Cerebral infarction, unspecified: Secondary | ICD-10-CM | POA: Diagnosis present

## 2020-01-05 DIAGNOSIS — L8990 Pressure ulcer of unspecified site, unspecified stage: Secondary | ICD-10-CM | POA: Insufficient documentation

## 2020-01-05 DIAGNOSIS — D638 Anemia in other chronic diseases classified elsewhere: Secondary | ICD-10-CM | POA: Diagnosis present

## 2020-01-05 DIAGNOSIS — Z6841 Body Mass Index (BMI) 40.0 and over, adult: Secondary | ICD-10-CM | POA: Diagnosis not present

## 2020-01-05 DIAGNOSIS — Z833 Family history of diabetes mellitus: Secondary | ICD-10-CM

## 2020-01-05 DIAGNOSIS — L89302 Pressure ulcer of unspecified buttock, stage 2: Secondary | ICD-10-CM | POA: Diagnosis not present

## 2020-01-05 DIAGNOSIS — I5032 Chronic diastolic (congestive) heart failure: Secondary | ICD-10-CM | POA: Diagnosis not present

## 2020-01-05 DIAGNOSIS — Z20822 Contact with and (suspected) exposure to covid-19: Secondary | ICD-10-CM | POA: Diagnosis present

## 2020-01-05 DIAGNOSIS — Z7401 Bed confinement status: Secondary | ICD-10-CM | POA: Diagnosis not present

## 2020-01-05 DIAGNOSIS — G9341 Metabolic encephalopathy: Secondary | ICD-10-CM | POA: Diagnosis present

## 2020-01-05 DIAGNOSIS — Z8673 Personal history of transient ischemic attack (TIA), and cerebral infarction without residual deficits: Secondary | ICD-10-CM

## 2020-01-05 LAB — CBC WITH DIFFERENTIAL/PLATELET
Abs Immature Granulocytes: 0.25 10*3/uL — ABNORMAL HIGH (ref 0.00–0.07)
Basophils Absolute: 0.1 10*3/uL (ref 0.0–0.1)
Basophils Relative: 0 %
Eosinophils Absolute: 0 10*3/uL (ref 0.0–0.5)
Eosinophils Relative: 0 %
HCT: 31.6 % — ABNORMAL LOW (ref 36.0–46.0)
Hemoglobin: 9.5 g/dL — ABNORMAL LOW (ref 12.0–15.0)
Immature Granulocytes: 1 %
Lymphocytes Relative: 1 %
Lymphs Abs: 0.2 10*3/uL — ABNORMAL LOW (ref 0.7–4.0)
MCH: 29.7 pg (ref 26.0–34.0)
MCHC: 30.1 g/dL (ref 30.0–36.0)
MCV: 98.8 fL (ref 80.0–100.0)
Monocytes Absolute: 0.6 10*3/uL (ref 0.1–1.0)
Monocytes Relative: 3 %
Neutro Abs: 23.8 10*3/uL — ABNORMAL HIGH (ref 1.7–7.7)
Neutrophils Relative %: 95 %
Platelets: 493 10*3/uL — ABNORMAL HIGH (ref 150–400)
RBC: 3.2 MIL/uL — ABNORMAL LOW (ref 3.87–5.11)
RDW: 20.1 % — ABNORMAL HIGH (ref 11.5–15.5)
WBC: 24.9 10*3/uL — ABNORMAL HIGH (ref 4.0–10.5)
nRBC: 0 % (ref 0.0–0.2)

## 2020-01-05 LAB — COMPREHENSIVE METABOLIC PANEL
ALT: 23 U/L (ref 0–44)
AST: 81 U/L — ABNORMAL HIGH (ref 15–41)
Albumin: 2.5 g/dL — ABNORMAL LOW (ref 3.5–5.0)
Alkaline Phosphatase: 172 U/L — ABNORMAL HIGH (ref 38–126)
Anion gap: 9 (ref 5–15)
BUN: 12 mg/dL (ref 8–23)
CO2: 22 mmol/L (ref 22–32)
Calcium: 8.4 mg/dL — ABNORMAL LOW (ref 8.9–10.3)
Chloride: 110 mmol/L (ref 98–111)
Creatinine, Ser: 1.15 mg/dL — ABNORMAL HIGH (ref 0.44–1.00)
GFR calc Af Amer: 51 mL/min — ABNORMAL LOW (ref >60)
GFR calc non Af Amer: 44 mL/min — ABNORMAL LOW (ref >60)
Glucose, Bld: 128 mg/dL — ABNORMAL HIGH (ref 70–99)
Potassium: 4.3 mmol/L (ref 3.5–5.1)
Sodium: 141 mmol/L (ref 135–145)
Total Bilirubin: 1 mg/dL (ref 0.3–1.2)
Total Protein: 6.9 g/dL (ref 6.5–8.1)

## 2020-01-05 LAB — PROCALCITONIN: Procalcitonin: <0.1 ng/mL

## 2020-01-05 LAB — POC SARS CORONAVIRUS 2 AG -  ED: SARS Coronavirus 2 Ag: NEGATIVE

## 2020-01-05 LAB — AMMONIA: Ammonia: 34 umol/L (ref 9–35)

## 2020-01-05 LAB — APTT: aPTT: 33 seconds (ref 24–36)

## 2020-01-05 LAB — PROTIME-INR
INR: 1.2 (ref 0.8–1.2)
Prothrombin Time: 14.8 seconds (ref 11.4–15.2)

## 2020-01-05 LAB — INFLUENZA PANEL BY PCR (TYPE A & B)
Influenza A By PCR: NEGATIVE
Influenza B By PCR: NEGATIVE

## 2020-01-05 LAB — LACTIC ACID, PLASMA
Lactic Acid, Venous: 2.8 mmol/L (ref 0.5–1.9)
Lactic Acid, Venous: 2.4 mmol/L (ref 0.5–1.9)

## 2020-01-05 MED ORDER — CLOPIDOGREL BISULFATE 75 MG PO TABS
75.0000 mg | ORAL_TABLET | Freq: Every day | ORAL | Status: DC
  Administered 2020-01-05 – 2020-01-12 (×8): 75 mg via ORAL
  Filled 2020-01-05 (×8): qty 1

## 2020-01-05 MED ORDER — LACTATED RINGERS IV BOLUS
1000.0000 mL | Freq: Once | INTRAVENOUS | Status: AC
  Administered 2020-01-05: 15:00:00 1000 mL via INTRAVENOUS

## 2020-01-05 MED ORDER — SODIUM CHLORIDE 0.9 % IV SOLN: 2.0000 g | Freq: Once | INTRAVENOUS | Status: DC

## 2020-01-05 MED ORDER — VANCOMYCIN HCL 10 G IV SOLR
2500.0000 mg | Freq: Once | INTRAVENOUS | Status: DC
  Filled 2020-01-05: qty 2500

## 2020-01-05 MED ORDER — SODIUM CHLORIDE 0.9 % IV SOLN
2.0000 g | Freq: Once | INTRAVENOUS | Status: AC
  Administered 2020-01-05: 2 g via INTRAVENOUS
  Filled 2020-01-05: qty 2

## 2020-01-05 MED ORDER — METRONIDAZOLE IN NACL 5-0.79 MG/ML-% IV SOLN
500.0000 mg | Freq: Once | INTRAVENOUS | Status: AC
  Administered 2020-01-05: 500 mg via INTRAVENOUS
  Filled 2020-01-05: qty 100

## 2020-01-05 MED ORDER — SODIUM CHLORIDE 0.9 % IV SOLN
2.0000 g | Freq: Two times a day (BID) | INTRAVENOUS | Status: DC
  Administered 2020-01-06 – 2020-01-08 (×5): 2 g via INTRAVENOUS
  Filled 2020-01-05 (×6): qty 2

## 2020-01-05 MED ORDER — ACETAMINOPHEN 325 MG PO TABS
ORAL_TABLET | ORAL | Status: AC
  Administered 2020-01-05: 650 mg
  Filled 2020-01-05: qty 2

## 2020-01-05 MED ORDER — LACTATED RINGERS IV BOLUS: 1000.0000 mL | Freq: Once | INTRAVENOUS | Status: DC

## 2020-01-05 MED ORDER — SODIUM CHLORIDE (PF) 0.9 % IJ SOLN
INTRAMUSCULAR | Status: AC
  Filled 2020-01-05: qty 50

## 2020-01-05 MED ORDER — VANCOMYCIN HCL 1750 MG/350ML IV SOLN
1750.0000 mg | INTRAVENOUS | Status: DC
  Administered 2020-01-07 – 2020-01-09 (×2): 1750 mg via INTRAVENOUS
  Filled 2020-01-05 (×2): qty 350

## 2020-01-05 MED ORDER — SENNOSIDES-DOCUSATE SODIUM 8.6-50 MG PO TABS
1.0000 | ORAL_TABLET | Freq: Two times a day (BID) | ORAL | Status: DC
  Administered 2020-01-05 – 2020-01-06 (×2): 1 via ORAL
  Filled 2020-01-05 (×2): qty 1

## 2020-01-05 MED ORDER — SODIUM CHLORIDE 0.9 % IV SOLN: Freq: Once | INTRAVENOUS | Status: AC

## 2020-01-05 MED ORDER — IOHEXOL 300 MG/ML  SOLN
80.0000 mL | Freq: Once | INTRAMUSCULAR | Status: AC | PRN
  Administered 2020-01-05: 100 mL via INTRAVENOUS

## 2020-01-05 MED ORDER — ISOSORBIDE MONONITRATE ER 60 MG PO TB24: 120.0000 mg | ORAL_TABLET | Freq: Every day | ORAL | Status: DC

## 2020-01-05 MED ORDER — SODIUM CHLORIDE 0.9 % IV BOLUS
1000.0000 mL | Freq: Once | INTRAVENOUS | Status: AC
  Administered 2020-01-05: 16:00:00 1000 mL via INTRAVENOUS

## 2020-01-05 MED ORDER — ACETAMINOPHEN 650 MG RE SUPP
RECTAL | Status: AC
  Filled 2020-01-05: qty 1

## 2020-01-05 MED ORDER — AMLODIPINE BESYLATE 5 MG PO TABS: 10.0000 mg | ORAL_TABLET | Freq: Every day | ORAL | Status: DC

## 2020-01-05 MED ORDER — POLYETHYLENE GLYCOL 3350 17 G PO PACK
17.0000 g | PACK | Freq: Two times a day (BID) | ORAL | Status: DC
  Administered 2020-01-05 – 2020-01-06 (×2): 17 g via ORAL
  Filled 2020-01-05 (×2): qty 1

## 2020-01-05 MED ORDER — VANCOMYCIN HCL 2000 MG/400ML IV SOLN
2000.0000 mg | Freq: Once | INTRAVENOUS | Status: DC
  Filled 2020-01-05: qty 400

## 2020-01-05 MED ORDER — HYDRALAZINE HCL 20 MG/ML IJ SOLN: 10.0000 mg | Freq: Three times a day (TID) | INTRAMUSCULAR | Status: DC | PRN

## 2020-01-05 MED ORDER — VANCOMYCIN HCL 2000 MG/400ML IV SOLN
2000.0000 mg | Freq: Once | INTRAVENOUS | Status: AC
  Administered 2020-01-05: 2000 mg via INTRAVENOUS
  Filled 2020-01-05: qty 400

## 2020-01-05 NOTE — Progress Notes (Signed)
Pharmacy Antibiotic Note  Connie Miranda is a 86 y.o. female admitted on 01/05/2020 with sepsis unknown source.  She was just discharged yesterday - during this admission she completed 10 day course of broad-spectrum IV antibiotics for cellulitis.  She also had removal of IUD, D&C and new dx of endometrial cancer during this admit.    Pharmacy was consulted for vancomycin and ceftazidime dosing.   Of note, she developed neurotoxicity while on Cefepime during her recent admission.  This was switched to Rocephin & improved.    01/05/2020:  Tm 103.52F  WBC increased to 24.9   Scr increased from baseline, est CrCl ~40-37ml/min  Plan:  Ceftazidime 2gm IV q12h  Vancomycin 1750mg  IV q48h to target AUC 400-550  Monitor renal function and cx data   Vancomycin levels at steady steady  Weight: 242 lb 8.1 oz (110 kg)  Temp (24hrs), Avg:103.2 F (39.6 C), Min:103.2 F (39.6 C), Max:103.2 F (39.6 C)  Recent Labs  Lab 12/31/19 0505 01/01/20 0631 01/02/20 0544 01/03/20 0520 01/04/20 0525 01/05/20 1429  WBC  --  8.1 8.2 8.6 8.8 24.9*  CREATININE 0.74 0.63 0.73 0.81  --  1.15*  LATICACIDVEN  --   --   --   --   --  2.8*    Estimated Creatinine Clearance: 43.8 mL/min (A) (by C-G formula based on SCr of 1.15 mg/dL (H)).    Antimicrobials this admission: 1/7 ceftazidime>>  1/7 vanco >>  1/7 metronidazole x1  Microbiology results: 1/7 BCx:  1/7 UCx:  1/7 SARS-CoV_2:  1/7 Influenza PCR: 12/27 MRSA PCR: negative   Allergies  Allergen Reactions  . Asa [Aspirin] Other (See Comments)    Doctor told her not to take due to type anemia  . Phosphate Other (See Comments)    Anything containing phosphate 6. Unknown reaction  . Codeine Other (See Comments)    Made her too sleepy  . Penicillins Itching and Swelling    Tolerates keflex  Did it involve swelling of the face/tongue/throat, SOB, or low BP? unknown Did it involve sudden or severe rash/hives, skin peeling, or any reaction on  the inside of your mouth or nose? unknown Did you need to seek medical attention at a hospital or doctor's office? unknown When did it last happen?years ago. If all above answers are "NO", may proceed with cephalosporin use.   . Sodium Phosphate      Thank you for allowing pharmacy to be a part of this patient's care.  Netta Cedars, PharmD, BCPS 01/05/2020  6:16 PM

## 2020-01-05 NOTE — H&P (Signed)
History and Physical  Connie Miranda [id number] DOB: 11/24/1933 DOA: 01/05/2020   Patient coming from: Home  Chief Complaint: AMS  HPI: Connie Miranda is a 86 y.o. female with medical history significant for morbid obesity, prior CVA, hypertension, chronic anemia, G6PD deficiency, bilateral lower extremity lymphedema, history of pemphigus, recent vulvar abscess, dysphagia, debility/bedbound for the last 5 years, brought to the ED with altered mental status/encephalopathy with fever as high as 103.  Of note, patient was recently admitted on 12/24/2019 and discharged on 01/04/2020 for almost exactly the same similar issues (encephalopathy plus fever).  During the last admission, patient had a retained old IUD and possible endometritis, started on broad-spectrum antibiotics and completed 10 days.  GYN was consulted during that admission, had D&C removal of old IUD.  Patient was subsequently diagnosed with endometrial adenocarcinoma.  Patient overall got better, and eventually discharged.  Spoke to daughter who reported that overnight patient was having some chills, otherwise no other complaints.  This morning daughter noticed that she was confused, felt warm as well as noted that she had vomited on herself.  Daughter called EMS and patient was transported to the ED for further management.  Patient denies any chest pain, shortness of breath, abdominal pain, nausea, dizziness.  ED Course: In the ED, patient was noted to be febrile, tachycardic, saturating above 95 on room air.  Labs notable for elevated lactic acid at 2.8, WBC elevated at 24.9, ammonia 34 creatinine 1.15, hemoglobin 9.5 which is around baseline.  Chest x-ray with no acute disease, CT abdomen showed marked volume of stool distending the rectum measuring about 18 x 8 cm, sigmoid diverticulosis without evidence of diverticulitis, CT head showed no evidence of acute intracranial abnormality.  Code sepsis was activated, patient was started on  broad-spectrum IV antibiotics, IV fluids, and admitted for further management.   Review of Systems: Review of systems are otherwise negative   Past Medical History:  Diagnosis Date  . Edema   . Edema of both legs 01/2016  . Glucose-6-phosphate dehydrogenase deficiency    possible diagnosis of G6PD per patient "phosphate 6 deficiency"  . Hypertension   . Osteoarthritis   . Pernicious anemia   . Stroke [facility]) 09/2000   couldn't move her left foot which resolved   Past Surgical History:  Procedure Laterality Date  . BONE MARROW BIOPSY  12/1966   for anemia  . IR PERC CHOLECYSTOSTOMY  04/08/2017  . IR RADIOLOGIST EVAL & MGMT  05/20/2017  . IR SINUS/FIST TUBE CHK-NON GI  06/11/2017  . MULTIPLE TOOTH EXTRACTIONS    . OTHER SURGICAL HISTORY     cyst from panty line removed as child    Social History:  reports that she quit smoking about 26 years ago. Her smoking use included cigarettes. She has never used smokeless tobacco. She reports that she does not drink alcohol or use drugs.   Allergies  Allergen Reactions  . Asa [Aspirin] Other (See Comments)    Doctor told her not to take due to type anemia  . Phosphate Other (See Comments)    Anything containing phosphate 6. Unknown reaction  . Codeine Other (See Comments)    Made her too sleepy  . Penicillins Itching and Swelling    Tolerates keflex  Did it involve swelling of the face/tongue/throat, SOB, or low BP? unknown Did it involve sudden or severe rash/hives, skin peeling, or any reaction on the inside of your mouth or nose? unknown Did you need to seek medical  attention at a hospital or doctor's office? unknown When did it last happen?years ago. If all above answers are "NO", may proceed with cephalosporin use.   . Sodium Phosphate     Family History  Problem Relation Age of Onset  . Diabetes Brother   . Diabetes Brother   . Heart failure Mother 8  . Heart attack Father 66      Prior to Admission medications    Medication Sig Start Date End Date Taking? Authorizing Provider  acetaminophen (TYLENOL) 500 MG tablet Take 500 mg by mouth every 6 (six) hours as needed for moderate pain.   Yes [provider]  amLODipine (NORVASC) 10 MG tablet Take 1 tablet (10 mg total) by mouth daily. 03/24/13  Yes Lauree Chandler, NP  Calcium Carbonate-Vitamin D (CALCIUM + D PO) Take 1 tablet by mouth daily.   Yes [provider]  clopidogrel (PLAVIX) 75 MG tablet Take 1 tablet (75 mg total) by mouth daily. 03/24/13  Yes Lauree Chandler, NP  cyanocobalamin (,VITAMIN B-12,) 1000 MCG/ML injection Inject 1 mL (1,000 mcg total) into the muscle every 30 (thirty) days. 03/24/13  Yes Lauree Chandler, NP  isosorbide mononitrate (IMDUR) 120 MG 24 hr tablet Take 120 mg by mouth daily. 03/19/19  Yes [provider]  Multiple Vitamin (MULTIVITAMIN WITH MINERALS) TABS Take 1 tablet by mouth daily. 03/24/13  Yes Lauree Chandler, NP  polyethylene glycol (MIRALAX / GLYCOLAX) 17 g packet Take 17 g by mouth daily. Patient taking differently: Take 17 g by mouth daily as needed for mild constipation.  12/15/19  Yes Drenda Freeze, MD  senna-docusate (SENOKOT-S) 8.6-50 MG tablet Take 1 tablet by mouth at bedtime. 01/04/20 02/03/20 Yes Alma Friendly, MD  mineral oil-hydrophilic petrolatum (AQUAPHOR) ointment Apply 1 application topically daily. To lower extremities Patient not taking: Reported on 03/24/2019 03/24/13   Lauree Chandler, NP    Physical Exam: BP 133/61   Pulse (!) 106   Temp (!) 103.2 F (39.6 C) (Rectal)   Resp (!) 24   Wt 110 kg   SpO2 96%   BMI 42.28 kg/m   General: NAD, chronically ill-appearing, obese, alert, awake, not oriented Eyes: Normal ENT: Normal Neck: Supple Cardiovascular: S1, S2 present Respiratory: Diminished breath sounds bilaterally Abdomen: Soft, nontender, nondistended, bowel sounds present Skin: Chronic venous skin changes noted  bilaterally Musculoskeletal: Chronic lymphedema Psychiatric: Normal mood Neurologic: No obvious focal neurologic deficit noted          Labs on Admission:  Basic Metabolic Panel: Recent Labs  Lab 12/30/19 0536 12/31/19 0505 01/01/20 0631 01/02/20 0544 01/03/20 0520 01/05/20 1429  NA 141  --  142 141 137 141  K 3.5  --  3.0* 3.1* 3.8 4.3  CL 111  --  111 113* 109 110  CO2 21*  --  23 23 22 22   GLUCOSE 108*  --  113* 106* 183* 128*  BUN 12  --  7* 7* 8 12  CREATININE 0.94 0.74 0.63 0.73 0.81 1.15*  CALCIUM 8.7*  --  8.6* 8.3* 8.3* 8.4*   Liver Function Tests: Recent Labs  Lab 12/30/19 0536 01/05/20 1429  AST 62* 81*  ALT 47* 23  ALKPHOS 119 172*  BILITOT 0.8 1.0  PROT 7.2 6.9  ALBUMIN 2.7* 2.5*   No results for input(s): LIPASE, AMYLASE in the last 168 hours. Recent Labs  Lab 12/30/19 0536 01/05/20 1431  AMMONIA 36* 34   CBC: Recent Labs  Lab  01/01/20 0631 01/02/20 0544 01/03/20 0520 01/04/20 0525 01/05/20 1429  WBC 8.1 8.2 8.6 8.8 24.9*  NEUTROABS  --  5.5 7.4 6.2 23.8*  HGB 9.3* 8.8* 8.3* 8.7* 9.5*  HCT 30.7* 27.2* 27.3* 28.9* 31.6*  MCV 96.8 93.2 96.8 99.3 98.8  PLT 498* 488* 471* 452* 493*   Cardiac Enzymes: No results for input(s): CKTOTAL, CKMB, CKMBINDEX, TROPONINI in the last 168 hours.  BNP (last 3 results) No results for input(s): BNP in the last 8760 hours.  ProBNP (last 3 results) No results for input(s): PROBNP in the last 8760 hours.  CBG: No results for input(s): GLUCAP in the last 168 hours.  Radiological Exams on Admission: CT Head Wo Contrast  Result Date: 01/05/2020 CLINICAL DATA:  Ataxia, stroke suspected. Altered mental status (AMS), unclear cause. EXAM: CT HEAD WITHOUT CONTRAST TECHNIQUE: Contiguous axial images were obtained from the base of the skull through the vertex without intravenous contrast. COMPARISON:  Brain MRI 12/29/2019, head CT 12/26/2021 FINDINGS: Brain: No evidence of acute intracranial hemorrhage. No  demarcated cortical infarction. No evidence of intracranial mass. No midline shift or extra-axial fluid collection. Stable ill-defined hypoattenuation within the cerebral white matter which is nonspecific, but consistent with chronic small vessel ischemic disease. Mild generalized parenchymal atrophy. Vascular: No hyperdense vessel. Skull: Normal. Negative for fracture or focal lesion. Sinuses/Orbits: Visualized orbits demonstrate no acute abnormality. Minimal ethmoid sinus mucosal thickening. No significant mastoid effusion. IMPRESSION: No CT evidence of acute intracranial abnormality. Generalized parenchymal atrophy and chronic small vessel ischemic disease. Electronically Signed   By: Kellie Simmering DO   On: 01/05/2020 17:15   CT ABDOMEN PELVIS W CONTRAST  Result Date: 01/05/2020 CLINICAL DATA:  Abdominal distension. Recent diagnosis of endometrial adenocarcinoma EXAM: CT ABDOMEN AND PELVIS WITH CONTRAST TECHNIQUE: Multidetector CT imaging of the abdomen and pelvis was performed using the standard protocol following bolus administration of intravenous contrast. CONTRAST:  18m OMNIPAQUE IOHEXOL 300 MG/ML  SOLN COMPARISON:  12/24/2019 FINDINGS: Lower chest: No acute abnormality. Hepatobiliary: Liver is within normal limits. Gallbladder is contracted, limiting its evaluation. No gallstone is evident. No biliary dilatation. Pancreas: Unremarkable. No pancreatic ductal dilatation or surrounding inflammatory changes. Spleen: Normal in size without focal abnormality. Adrenals/Urinary Tract: No adrenal nodule. Kidneys enhance symmetrically. No hydronephrosis. Urinary bladder is decompressed, limiting its evaluation. Stomach/Bowel: Marked volume of stool distending the rectum measuring approximately 18 x 8 cm. Scattered sigmoid diverticulosis. No pericolonic inflammatory changes. Normal appendix in the right lower quadrant. No dilated loops of small bowel. Small hiatal hernia. Stomach otherwise unremarkable.  Vascular/Lymphatic: Scattered atherosclerotic disease. No acute vascular findings. No abdominal or pelvic lymphadenopathy. Reproductive: Abnormally thickened endometrium. No adnexal masses. Other: There is a trace amount of free fluid within the abdomen and pelvis. No pneumoperitoneum. No abdominal wall hernia. Musculoskeletal: Mild diffuse body wall edema. Severe degenerative changes of the lumbar spine without new or acute osseous finding. Bones are diffusely demineralized. IMPRESSION: 1. Marked volume of stool distending the rectum measuring approximately 18 x 8 cm. 2. Trace amount of free fluid within the abdomen and pelvis. Which could be related to patient fluid status as there is also body wall edema. 3. Abnormally thickened endometrium, similar to the prior study. 4. Sigmoid diverticulosis without evidence of diverticulitis. 5. Aortic atherosclerosis. Aortic Atherosclerosis (ICD10-I70.0). Electronically Signed   By: NDavina PokeD.O.   On: 01/05/2020 17:22   DG Chest Port 1 View  Result Date: 01/05/2020 CLINICAL DATA:  Altered mental status, lethargy and fever. EXAM: PORTABLE CHEST  1 VIEW COMPARISON:  PA and lateral chest 12/24/2019. Single-view of the chest 06/11/2018. FINDINGS: Lungs clear. Heart size normal. No pneumothorax or pleural effusion. Mild elevation of the right hemidiaphragm relative to the left noted. No acute or focal bony abnormality. IMPRESSION: No acute disease. Electronically Signed   By: Inge Rise M.D.   On: 01/05/2020 15:12    EKG: Independently reviewed.  EKG with sinus tachycardia  Assessment/Plan Present on Admission: . Sepsis ([place]) . Hypertension . Stroke ([place]) . Glucose-6-phosphate dehydrogenase deficiency . Morbid obesity ([place]) . Endometrial carcinoma (H[facility])  Principal Problem:   Sepsis ([place]) Active Problems:   Hypertension   Stroke ([place])   Glucose-6-phosphate dehydrogenase deficiency   Lymphedema   Morbid obesity ([place])   Endometrial carcinoma  (HCC)   Sepsis Unknown etiology, suspect possible aspiration pneumonia, r/o covid Febrile, tachycardia, lactic acidosis on admission Saturating well on room air Lactic acid elevated, will trend UA/UC pending collection BC x2 pending POC Covid test negative, PCR pending Procalcitonin pending Chest x-ray unremarkable CT abdomen/pelvis showed marked volume of stool distending the rectum measuring about 18 x 8 cm, sigmoid diverticulosis without evidence of diverticulitis Status post IV fluids, broad-spectrum antibiotics by EDP Continue IV vancomycin, ceftazidime SLP ordered Monitor closely on telemetry  Acute metabolic encephalopathy Likely due to above CT head unremarkable Monitor closely  Constipation CT abdomen/pelvis showed marked volume of stool distending the rectum measuring about 18 x 8 cm Senna twice daily, MiraLAX twice daily, tapwater enema  History of bullous pemphigoid/bilateral lymphedema/bilateral chronic venous stasis changes No signs of acute infection Not on any steroids or immunomodulators Monitor closely  Essential hypertension BP stable In view of sepsis, will hold amlodipine, Imdur IV hydralazine as needed  Chronic diastolic HF Difficult to assess fluid status due to body habitus and chronic lymphedema Monitor closely  Anemia of chronic disease Hemoglobin around baseline Noted to have some vaginal bleed Daily CBC  History of CVA Patient is bedbound over 5 years Continue home Plavix  Endometrioid adenocarcinoma, Figo grade 2 CT abdomen/pelvis and transvaginal ultrasound revealed old retained IUD endometrial thickening to 19.1 mm Underwent D&C with removal of old IUD and biopsy that revealed above GYN oncology consulted on last admission, recommended follow-up as an outpatient to discuss further management, not a surgical candidate, looking more towords progesterone IUD in the future  Debility/physical deconditioning Bedbound May need SNF this  time around  Morbid obesity Lifestyle modification advised         DVT prophylaxis: Heparin [place]  Code Status: Full  Family Communication: Discussed extensively with daughter Connie Miranda  Disposition Plan: To be determined  Consults called: None  Admission status: Inpatient    Alma Friendly MD [facility]   If 7PM-7AM, please contact night-coverage [email or web address]  01/05/2020, 6:28 PM

## 2020-01-05 NOTE — Progress Notes (Addendum)
A consult was received from an ED physician for Vancomycin & Cefepime per pharmacy dosing.  The patient's profile has been reviewed for ht/wt/allergies/indication/available labs.   *Noted patient discharged yesterday & completed 10 day course of antibiotics for cellulitis during admission.  Cx data was negative.  PCN allergy noted.  Patient has tolerated cephalosporins in past.  Of note- concern for neurotoxicity related to Cefepime during last admission.  This resolved with switch to Rocephin.  Discussed with ED Provider & will change Cefepime to Ceftazidime.  A one time order has been placed for Fortaz 2gm & Vancomycin 2gm.  Further antibiotics/pharmacy consults should be ordered by admitting physician if indicated.                       Thank you, Netta Cedars, PharmD, BCPS 01/05/2020  2:33 PM

## 2020-01-05 NOTE — ED Notes (Signed)
Date and time results received: 01/05/20 1528 (use smartphrase ".now" to insert current time)  Test: Lactic Acid Critical Value: 2.8  Name of Provider Notified: [place], DO Orders Received? Or Actions Taken?: Orders Received - See Orders for details

## 2020-01-05 NOTE — ED Notes (Addendum)
Unable to perform in/out cath at this time d/t pts inability to outwardly flex legs to catheterize. EDP Cur[place]olo made aware.

## 2020-01-05 NOTE — ED Triage Notes (Signed)
Pt BIBA from home-   Per EMS- Family reports pt d/c from hospital yesterday. Family reports pt AMS starting this AM.  EMS reports FD monitored O2 sats at 81% RA, HR 112, 116/62, 100.4 tympanic.

## 2020-01-05 NOTE — ED Notes (Signed)
Genelle Gather [phone], daughter would like an update on her mother.

## 2020-01-05 NOTE — ED Provider Notes (Signed)
I received the patient in signout from Dr. Ronnald Nian, briefly the patient is a 86 year old female who was just in the hospital for believed pelvic infection.  Ended up undergoing a D&C with IUD removal.  She had some clinical improvement and was discharged home.  Family states that she had gotten ill again over the past 24 hours.  Temperature of 103.2 here.  Given broad-spectrum antibiotics.  Chest x-ray without obvious infection.  Awaiting UA, and CT scan of the abdomen pelvis.  CT scan has returned without obvious cause of infection.  She does have a very large amount of stool in the rectum.  My rectal exam with no stool that I could disimpact.  I discussed with the nursing staff about performing in and outs and unfortunately due to the patient's body habitus and leg edema it has been too difficult to perform.  Will discuss with hospitalist.   Deno Etienne, DO 01/06/20 0124

## 2020-01-05 NOTE — ED Notes (Signed)
Patient transported to CT 

## 2020-01-05 NOTE — ED Provider Notes (Signed)
[facility] DEPT Provider Note   CSN: [id number] Arrival date & time: 01/05/20  1330     History Chief Complaint  Patient presents with  . Fever  . Altered Mental Status    Connie Miranda is a 86 y.o. female.  The history is provided by the patient and a caregiver.  Altered Mental Status Presenting symptoms: confusion (fever)   Severity:  Mild Most recent episode:  Today Episode history:  Single Timing:  Constant Progression:  Worsening Chronicity:  New Context: recent illness (Just discharged from hospital yesterday after sepsis for cellulitis of legs and D and C and IUD removal by OBGYN. Newly diagnosed with endometrial cancer but thought IUD caused fever. )   Associated symptoms: fever   Associated symptoms: no abdominal pain, no palpitations, no rash, no seizures and no vomiting        Past Medical History:  Diagnosis Date  . Edema   . Edema of both legs 01/2016  . Glucose-6-phosphate dehydrogenase deficiency    possible diagnosis of G6PD per patient "phosphate 6 deficiency"  . Hypertension   . Osteoarthritis   . Pernicious anemia   . Stroke [facility]) 09/2000   couldn't move her left foot which resolved    Patient Active Problem List   Diagnosis Date Noted  . Endometrial carcinoma ([place]) 01/03/2020  . Thickened endometrium   . Shock circulatory ([place]) 12/24/2019  . Chronic diastolic CHF (congestive heart failure) ([place]) 06/10/2018  . Sepsis ([place]) 06/10/2018  . Acute respiratory failure with hypoxia ([place]) 06/10/2018  . Hypokalemia 06/10/2018  . Acute bronchitis 06/10/2018  . Lower extremity weakness 02/11/2016  . Difficulty walking 04/08/2013  . Weakness of both legs 04/08/2013  . Morbid obesity ([place]) 04/08/2013  . AKI (acute kidney injury) ([place]) 01/28/2013  . Hyperkalemia 01/28/2013  . Fall 01/28/2013  . Nausea 01/28/2013  . Lymphedema 10/16/2012  . Cellulitis 10/16/2012  . Diarrhea 10/13/2012  . Cellulitis of left lower leg  10/12/2012  . Anemia 10/12/2012  . Abdominal pain 10/12/2012  . Hip pain 10/12/2012  . Hypertension   . Edema   . Pernicious anemia   . Glucose-6-phosphate dehydrogenase deficiency   . Osteoarthritis   . Stroke [facility]) 09/28/2000    Past Surgical History:  Procedure Laterality Date  . BONE MARROW BIOPSY  12/1966   for anemia  . IR PERC CHOLECYSTOSTOMY  04/08/2017  . IR RADIOLOGIST EVAL & MGMT  05/20/2017  . IR SINUS/FIST TUBE CHK-NON GI  06/11/2017  . MULTIPLE TOOTH EXTRACTIONS    . OTHER SURGICAL HISTORY     cyst from panty line removed as child     OB History   No obstetric history on file.     Family History  Problem Relation Age of Onset  . Diabetes Brother   . Diabetes Brother   . Heart failure Mother 34  . Heart attack Father 96    Social History   Tobacco Use  . Smoking status: Former Smoker    Types: Cigarettes    Quit date: 12/29/1993    Years since quitting: 26.0  . Smokeless tobacco: Never Used  Substance Use Topics  . Alcohol use: No  . Drug use: No    Home Medications Prior to Admission medications   Medication Sig Start Date End Date Taking? Authorizing Provider  acetaminophen (TYLENOL) 500 MG tablet Take 500 mg by mouth every 6 (six) hours as needed for moderate pain.   Yes [provider]  amLODipine (NORVASC)  10 MG tablet Take 1 tablet (10 mg total) by mouth daily. 03/24/13  Yes Lauree Chandler, NP  Calcium Carbonate-Vitamin D (CALCIUM + D PO) Take 1 tablet by mouth daily.   Yes [provider]  clopidogrel (PLAVIX) 75 MG tablet Take 1 tablet (75 mg total) by mouth daily. 03/24/13  Yes Lauree Chandler, NP  cyanocobalamin (,VITAMIN B-12,) 1000 MCG/ML injection Inject 1 mL (1,000 mcg total) into the muscle every 30 (thirty) days. 03/24/13  Yes Lauree Chandler, NP  isosorbide mononitrate (IMDUR) 120 MG 24 hr tablet Take 120 mg by mouth daily. 03/19/19  Yes [provider]  Multiple Vitamin (MULTIVITAMIN WITH MINERALS)  TABS Take 1 tablet by mouth daily. 03/24/13  Yes Lauree Chandler, NP  polyethylene glycol (MIRALAX / GLYCOLAX) 17 g packet Take 17 g by mouth daily. Patient taking differently: Take 17 g by mouth daily as needed for mild constipation.  12/15/19  Yes Drenda Freeze, MD  senna-docusate (SENOKOT-S) 8.6-50 MG tablet Take 1 tablet by mouth at bedtime. 01/04/20 02/03/20 Yes Alma Friendly, MD  mineral oil-hydrophilic petrolatum (AQUAPHOR) ointment Apply 1 application topically daily. To lower extremities Patient not taking: Reported on 03/24/2019 03/24/13   Lauree Chandler, NP    Allergies    Asa [aspirin], Phosphate, Codeine, Penicillins, and Sodium phosphate  Review of Systems   Review of Systems  Constitutional: Positive for fever. Negative for chills.  HENT: Negative for ear pain and sore throat.   Eyes: Negative for pain and visual disturbance.  Respiratory: Negative for cough and shortness of breath.   Cardiovascular: Negative for chest pain and palpitations.  Gastrointestinal: Negative for abdominal pain and vomiting.  Genitourinary: Negative for dysuria and hematuria.  Musculoskeletal: Negative for arthralgias and back pain.  Skin: Negative for color change and rash.  Neurological: Negative for seizures and syncope.  Psychiatric/Behavioral: Positive for confusion (fever).  All other systems reviewed and are negative.   Physical Exam Updated Vital Signs  ED Triage Vitals  Enc Vitals Group     BP 01/05/20 1406 (!) 111/49     Pulse Rate 01/05/20 1406 (!) 113     Resp 01/05/20 1406 (!) 29     Temp --      Temp src --      SpO2 01/05/20 1405 94 %     Weight 01/05/20 1407 242 lb 8.1 oz (110 kg)     Height --      Head Circumference --      Peak Flow --      Pain Score --      Pain Loc --      Pain Edu? --      Excl. in [place]? --     Physical Exam Vitals and nursing note reviewed.  Constitutional:      General: She is not in acute distress.    Appearance: She is  well-developed. She is obese. She is ill-appearing.  HENT:     Head: Normocephalic and atraumatic.     Nose: Nose normal.     Mouth/Throat:     Mouth: Mucous membranes are moist.  Eyes:     Conjunctiva/sclera: Conjunctivae normal.     Pupils: Pupils are equal, round, and reactive to light.  Cardiovascular:     Rate and Rhythm: Normal rate and regular rhythm.     Pulses: Normal pulses.     Heart sounds: Normal heart sounds. No murmur.  Pulmonary:     Effort:  Pulmonary effort is normal. No respiratory distress.     Breath sounds: Normal breath sounds.  Abdominal:     General: There is no distension.     Palpations: Abdomen is soft.     Tenderness: There is no abdominal tenderness.  Genitourinary:    Comments: Possible some blood from GU area, patient with brown stool mix around vaginal area/perineum, no obvious crepitus to perineum, upper part of legs, difficult to widen legs due to habitus and discomfort.  Musculoskeletal:        General: Normal range of motion.     Cervical back: Normal range of motion and neck supple.  Skin:    General: Skin is warm and dry.     Comments: Chronic appearing skin breakdown to b/l lower extremities  Neurological:     General: No focal deficit present.     Mental Status: She is alert.     Comments: Patient confused but able to answer some simple questions, appears to be able to move all extremities, no obvious neurological findings with no obvious weakness, sensation changes, speech issues  Psychiatric:        Mood and Affect: Mood normal.     ED Results / Procedures / Treatments   Labs (all labs ordered are listed, but only abnormal results are displayed) Labs Reviewed  LACTIC ACID, PLASMA - Abnormal; Notable for the following components:      Result Value   Lactic Acid, Venous 2.8 (*)    All other components within normal limits  COMPREHENSIVE METABOLIC PANEL - Abnormal; Notable for the following components:   Glucose, Bld 128 (*)     Creatinine, Ser 1.15 (*)    Calcium 8.4 (*)    Albumin 2.5 (*)    AST 81 (*)    Alkaline Phosphatase 172 (*)    GFR calc non Af Amer 44 (*)    GFR calc Af Amer 51 (*)    All other components within normal limits  CBC WITH DIFFERENTIAL/PLATELET - Abnormal; Notable for the following components:   WBC 24.9 (*)    RBC 3.20 (*)    Hemoglobin 9.5 (*)    HCT 31.6 (*)    RDW 20.1 (*)    Platelets 493 (*)    Neutro Abs 23.8 (*)    Lymphs Abs 0.2 (*)    Abs Immature Granulocytes 0.25 (*)    All other components within normal limits  CULTURE, BLOOD (ROUTINE X 2)  CULTURE, BLOOD (ROUTINE X 2)  URINE CULTURE  APTT  PROTIME-INR  AMMONIA  LACTIC ACID, PLASMA  URINALYSIS, ROUTINE W REFLEX MICROSCOPIC  POC SARS CORONAVIRUS 2 AG -  ED    EKG EKG Interpretation  Date/Time:  Thursday January 05 2020 15:28:13 EST Ventricular Rate:  107 PR Interval:    QRS Duration: 90 QT Interval:  410 QTC Calculation: 548 R Axis:   -9 Text Interpretation: Sinus tachycardia Prolonged PR interval Low voltage, precordial leads Prolonged QT interval Confirmed by Lennice Sites [phone]) on 01/05/2020 3:56:22 PM   Radiology DG Chest Port 1 View  Result Date: 01/05/2020 CLINICAL DATA:  Altered mental status, lethargy and fever. EXAM: PORTABLE CHEST 1 VIEW COMPARISON:  PA and lateral chest 12/24/2019. Single-view of the chest 06/11/2018. FINDINGS: Lungs clear. Heart size normal. No pneumothorax or pleural effusion. Mild elevation of the right hemidiaphragm relative to the left noted. No acute or focal bony abnormality. IMPRESSION: No acute disease. Electronically Signed   By: Inge Rise M.D.  On: 01/05/2020 15:12    Procedures .Critical Care Performed by: Lennice Sites, DO Authorized by: Lennice Sites, DO   Critical care provider statement:    Critical care time (minutes):  45   Critical care was necessary to treat or prevent imminent or life-threatening deterioration of the following conditions:   Sepsis   Critical care was time spent personally by me on the following activities:  Blood draw for specimens, development of treatment plan with patient or surrogate, discussions with primary provider, evaluation of patient's response to treatment, examination of patient, obtaining history from patient or surrogate, ordering and performing treatments and interventions, ordering and review of laboratory studies, ordering and review of radiographic studies, pulse oximetry, re-evaluation of patient's condition and review of old charts   (including critical care time)  Medications Ordered in ED Medications  vancomycin (VANCOREADY) IVPB 2000 mg/400 mL (2,000 mg Intravenous New Bag/Given 01/05/20 1552)  cefTAZidime (FORTAZ) 2 g in sodium chloride 0.9 % 100 mL IVPB (2 g Intravenous New Bag/Given 01/05/20 1556)  metroNIDAZOLE (FLAGYL) IVPB 500 mg (has no administration in time range)  lactated ringers bolus 1,000 mL (0 mLs Intravenous Stopped 01/05/20 1618)  acetaminophen (TYLENOL) 325 MG tablet (650 mg  Given 01/05/20 1435)  0.9 %  sodium chloride infusion ( Intravenous New Bag/Given 01/05/20 1554)  sodium chloride 0.9 % bolus 1,000 mL (1,000 mLs Intravenous New Bag/Given 01/05/20 1549)    ED Course  I have reviewed the triage vital signs and the nursing notes.  Pertinent labs & imaging results that were available during my care of the patient were reviewed by me and considered in my medical decision making (see chart for details).    MDM Rules/Calculators/A&P  Connie Miranda is an 86 year old female with history of stroke, hypertension presents the ED with fever, altered mental status.  Was discharged from the hospital yesterday after being treated for fever and infectious process as well.  She per family and per nurse presented similarly at that time.  She is confusingly fever.  She was treated for presumed cellulitis.  During her hospital stay she had an IUD removed and had a D&C and was diagnosed with an  endometrial cancer.  She went back home yesterday and daughter states that she has been with her all day and she has slowly gotten more confused and not eating and drinking much.  She is febrile to 103 and tachycardic to 120 upon arrival.  Sepsis work-up was initiated.  She appears to have vomited on herself.  She follows commands and overall appears to be neurologically at her baseline.  She has chronic breakdown of skin in her legs but there is no obvious cellulitis or crepitus.  She is covered in her own stool.  She does have some vaginal bleeding but there is no obvious heavy bleeding or discharge.  She might have some abdominal pain but overall it is difficult to get a true history and physical.  She appears to be febrile and likely septic again.  I talked with Dr. Kennon Rounds with OB/GYN who actually performed her IUD removal and D&C and she states that overall procedure was unremarkable.  She did not believe that IUD was a source for infection.  They suggest possibly CT scan abdomen pelvis to rule out any bowel or uterine injury but less likely given that this procedure was several days ago. No major concern for a necrotizing fascitis based on H and P. Will start sepsis work-up.  We will empirically give IV  antibiotics.  Will give IV fluids.  Patient with similar presentation.  It appeared that there was never really a source found but she did have improvement after antibiotics and antibiotics were stopped after 10 days of treatment.  She was not discharged home on antibiotics.  Patient with leukocytosis of 25.  Creatinine mildly elevated to 1.15.  Chest x-ray with no signs of infection.  Awaiting lactic acid, urinalysis, CT abdomen pelvis and then anticipate admission to the hospital for further sepsis care.  Covid test also ordered.  Per daughter she has had a mild cough.  Lactic acid is 2.8.  Additional normal saline bolus ordered.  Will start IV maintenance fluids.  Vital signs are improving.  Awaiting CT  scan abdomen pelvis, urinalysis.  To be handed off to oncoming ED staff with patient pending remaining workup for sepsis.  This chart was dictated using voice recognition software.  Despite best efforts to proofread,  errors can occur which can change the documentation meaning.   Connie Miranda was evaluated in Emergency Department on 01/05/2020 for the symptoms described in the history of present illness. She was evaluated in the context of the global COVID-19 pandemic, which necessitated consideration that the patient might be at risk for infection with the SARS-CoV-2 virus that causes COVID-19. Institutional protocols and algorithms that pertain to the evaluation of patients at risk for COVID-19 are in a state of rapid change based on information released by regulatory bodies including the CDC and federal and state organizations. These policies and algorithms were followed during the patient's care in the ED.    Final Clinical Impression(s) / ED Diagnoses Final diagnoses:  Sepsis, due to unspecified organism, unspecified whether acute organ dysfunction present [facility])    Rx / DC Orders ED Discharge Orders    None       Lennice Sites, DO 01/05/20 1624

## 2020-01-06 ENCOUNTER — Encounter (HOSPITAL_COMMUNITY): Payer: Self-pay | Admitting: Internal Medicine

## 2020-01-06 ENCOUNTER — Inpatient Hospital Stay (HOSPITAL_COMMUNITY): Payer: Medicare Other

## 2020-01-06 DIAGNOSIS — I10 Essential (primary) hypertension: Secondary | ICD-10-CM

## 2020-01-06 DIAGNOSIS — L8990 Pressure ulcer of unspecified site, unspecified stage: Secondary | ICD-10-CM | POA: Insufficient documentation

## 2020-01-06 LAB — CBC
HCT: 27.8 % — ABNORMAL LOW (ref 36.0–46.0)
Hemoglobin: 8.2 g/dL — ABNORMAL LOW (ref 12.0–15.0)
MCH: 30.1 pg (ref 26.0–34.0)
MCHC: 29.5 g/dL — ABNORMAL LOW (ref 30.0–36.0)
MCV: 102.2 fL — ABNORMAL HIGH (ref 80.0–100.0)
Platelets: 426 10*3/uL — ABNORMAL HIGH (ref 150–400)
RBC: 2.72 MIL/uL — ABNORMAL LOW (ref 3.87–5.11)
RDW: 20.2 % — ABNORMAL HIGH (ref 11.5–15.5)
WBC: 19.1 10*3/uL — ABNORMAL HIGH (ref 4.0–10.5)
nRBC: 0 % (ref 0.0–0.2)

## 2020-01-06 LAB — COMPREHENSIVE METABOLIC PANEL
ALT: 22 U/L (ref 0–44)
AST: 60 U/L — ABNORMAL HIGH (ref 15–41)
Albumin: 2.1 g/dL — ABNORMAL LOW (ref 3.5–5.0)
Alkaline Phosphatase: 121 U/L (ref 38–126)
Anion gap: 7 (ref 5–15)
BUN: 15 mg/dL (ref 8–23)
CO2: 21 mmol/L — ABNORMAL LOW (ref 22–32)
Calcium: 7.9 mg/dL — ABNORMAL LOW (ref 8.9–10.3)
Chloride: 111 mmol/L (ref 98–111)
Creatinine, Ser: 1.16 mg/dL — ABNORMAL HIGH (ref 0.44–1.00)
GFR calc Af Amer: 50 mL/min — ABNORMAL LOW (ref >60)
GFR calc non Af Amer: 43 mL/min — ABNORMAL LOW (ref >60)
Glucose, Bld: 133 mg/dL — ABNORMAL HIGH (ref 70–99)
Potassium: 4.3 mmol/L (ref 3.5–5.1)
Sodium: 139 mmol/L (ref 135–145)
Total Bilirubin: 1 mg/dL (ref 0.3–1.2)
Total Protein: 6 g/dL — ABNORMAL LOW (ref 6.5–8.1)

## 2020-01-06 LAB — LACTIC ACID, PLASMA: Lactic Acid, Venous: 2.6 mmol/L (ref 0.5–1.9)

## 2020-01-06 LAB — SARS CORONAVIRUS 2 (TAT 6-24 HRS): SARS Coronavirus 2: NEGATIVE

## 2020-01-06 LAB — PROCALCITONIN: Procalcitonin: 21.92 ng/mL

## 2020-01-06 MED ORDER — SODIUM CHLORIDE 0.9 % IV SOLN
INTRAVENOUS | Status: DC | PRN
  Administered 2020-01-06: 250 mL via INTRAVENOUS
  Administered 2020-01-10: 1000 mL via INTRAVENOUS

## 2020-01-06 MED ORDER — SODIUM CHLORIDE 0.9 % IV SOLN: INTRAVENOUS | Status: DC

## 2020-01-06 MED ORDER — ISOSORBIDE MONONITRATE ER 60 MG PO TB24
120.0000 mg | ORAL_TABLET | Freq: Every day | ORAL | Status: DC
  Administered 2020-01-06 – 2020-01-12 (×7): 120 mg via ORAL
  Filled 2020-01-06 (×7): qty 2

## 2020-01-06 MED ORDER — ACETAMINOPHEN 650 MG RE SUPP: 650.0000 mg | Freq: Four times a day (QID) | RECTAL | Status: DC | PRN

## 2020-01-06 MED ORDER — SENNOSIDES-DOCUSATE SODIUM 8.6-50 MG PO TABS
1.0000 | ORAL_TABLET | Freq: Every day | ORAL | Status: DC
  Administered 2020-01-07 – 2020-01-11 (×5): 1 via ORAL
  Filled 2020-01-06 (×5): qty 1

## 2020-01-06 MED ORDER — HEPARIN SODIUM (PORCINE) 5000 UNIT/ML IJ SOLN
5000.0000 [IU] | Freq: Three times a day (TID) | INTRAMUSCULAR | Status: DC
  Administered 2020-01-06 – 2020-01-12 (×20): 5000 [IU] via SUBCUTANEOUS
  Filled 2020-01-06 (×20): qty 1

## 2020-01-06 MED ORDER — AMLODIPINE BESYLATE 10 MG PO TABS
10.0000 mg | ORAL_TABLET | Freq: Every day | ORAL | Status: DC
  Administered 2020-01-06 – 2020-01-12 (×7): 10 mg via ORAL
  Filled 2020-01-06 (×7): qty 1

## 2020-01-06 MED ORDER — ONDANSETRON HCL 4 MG PO TABS: 4.0000 mg | ORAL_TABLET | Freq: Four times a day (QID) | ORAL | Status: DC | PRN

## 2020-01-06 MED ORDER — ONDANSETRON HCL 4 MG/2ML IJ SOLN: 4.0000 mg | Freq: Four times a day (QID) | INTRAMUSCULAR | Status: DC | PRN

## 2020-01-06 MED ORDER — ALBUTEROL SULFATE (2.5 MG/3ML) 0.083% IN NEBU: 2.5000 mg | INHALATION_SOLUTION | RESPIRATORY_TRACT | Status: DC | PRN

## 2020-01-06 MED ORDER — POLYETHYLENE GLYCOL 3350 17 G PO PACK
17.0000 g | PACK | Freq: Every day | ORAL | Status: DC
  Administered 2020-01-08 – 2020-01-11 (×4): 17 g via ORAL
  Filled 2020-01-06 (×5): qty 1

## 2020-01-06 MED ORDER — ACETAMINOPHEN 325 MG PO TABS: 650.0000 mg | ORAL_TABLET | Freq: Four times a day (QID) | ORAL | Status: DC | PRN

## 2020-01-06 NOTE — ED Notes (Signed)
ED TO INPATIENT HANDOFF REPORT  Name/Age/Gender Connie Miranda 86 y.o. female  Code Status Code Status History    Date Active Date Inactive Code Status Order ID Comments User Context   12/25/2019 0450 01/04/2020 2258 Full Code [id number]  Elwyn Reach, MD Inpatient   06/10/2018 0257 06/14/2018 1717 Full Code [id number]  Ivor Costa, MD ED   04/06/2017 0215 04/10/2017 1930 Full Code [id number]  Rise Patience, MD Inpatient   02/11/2016 2333 02/13/2016 2251 Full Code [id number]  Rise Patience, MD Inpatient   04/08/2013 2343 04/11/2013 1917 Full Code [id number]  Theressa Millard, MD ED   01/28/2013 2012 02/01/2013 2133 Full Code [id number]  Dudley Major, RN Inpatient   10/17/2012 0017 10/19/2012 1655 Full Code [id number]  Faucette, Josephine Cables, RN Inpatient   Advance Care Planning Activity      Home/SNF/Other Home  Chief Complaint Sepsis [facility]) [A41.9]  Level of Care/Admitting Diagnosis ED Disposition    ED Disposition Condition [place]: [facility] [[id number]]  Level of Care: Telemetry [5]  Admit to tele based on following criteria: Eval of Syncope  Covid Evaluation: Asymptomatic Screening Protocol (No Symptoms)  Diagnosis: Sepsis [facility]) [[id number]]  Admitting Physician: Alma Friendly [[id number]]  Attending Physician: Alma Friendly [[id number]]  Estimated length of stay: past midnight tomorrow  Certification:: I certify this patient will need inpatient services for at least 2 midnights       Medical History Past Medical History:  Diagnosis Date  . Edema   . Edema of both legs 01/2016  . Glucose-6-phosphate dehydrogenase deficiency    possible diagnosis of G6PD per patient "phosphate 6 deficiency"  . Hypertension   . Osteoarthritis   . Pernicious anemia   . Stroke [facility]) 09/2000   couldn't move her left foot which resolved    Allergies Allergies  Allergen Reactions  . Asa [Aspirin] Other (See Comments)     Doctor told her not to take due to type anemia  . Phosphate Other (See Comments)    Anything containing phosphate 6. Unknown reaction  . Codeine Other (See Comments)    Made her too sleepy  . Penicillins Itching and Swelling    Tolerates keflex  Did it involve swelling of the face/tongue/throat, SOB, or low BP? unknown Did it involve sudden or severe rash/hives, skin peeling, or any reaction on the inside of your mouth or nose? unknown Did you need to seek medical attention at a hospital or doctor's office? unknown When did it last happen?years ago. If all above answers are "NO", may proceed with cephalosporin use.   . Sodium Phosphate     IV Location/Drains/Wounds Patient Lines/Drains/Airways Status   Active Line/Drains/Airways    Name:   Placement date:   Placement time:   Site:   Days:   Peripheral IV 01/05/20   01/05/20    1408    --   1   Peripheral IV 01/05/20 Left Hand   01/05/20    1543    Hand   1   Biliary Tube Cook slip-coat 10.2 Fr. RUQ   04/08/17    1635    RUQ   1003   External Urinary Catheter   06/10/18    1559    --   575   Wound / Incision (Open or Dehisced) 06/11/18 Other (Comment) Groin Left;Right skin breakdown from excessive moisture   06/11/18    1103    Groin   574  Labs/Imaging Results for orders placed or performed during the hospital encounter of 01/05/20 (from the past 48 hour(s))  Lactic acid, plasma     Status: Abnormal   Collection Time: 01/05/20  2:29 PM  Result Value Ref Range   Lactic Acid, Venous 2.8 (HH) 0.5 - 1.9 mmol/L    Comment: CRITICAL RESULT CALLED TO, READ BACK BY AND VERIFIED WITH: K.WICKER AT 1528 ON 01/05/20 BY N.THOMPSON Performed at [facility], [place] [address], [place], [place] [address]   Comprehensive metabolic panel     Status: Abnormal   Collection Time: 01/05/20  2:29 PM  Result Value Ref Range   Sodium 141 135 - 145 mmol/L   Potassium 4.3 3.5 - 5.1 mmol/L   Chloride 110 98 - 111 mmol/L    CO2 22 22 - 32 mmol/L   Glucose, Bld 128 (H) 70 - 99 mg/dL   BUN 12 8 - 23 mg/dL   Creatinine, Ser 1.15 (H) 0.44 - 1.00 mg/dL   Calcium 8.4 (L) 8.9 - 10.3 mg/dL   Total Protein 6.9 6.5 - 8.1 g/dL   Albumin 2.5 (L) 3.5 - 5.0 g/dL   AST 81 (H) 15 - 41 U/L   ALT 23 0 - 44 U/L   Alkaline Phosphatase 172 (H) 38 - 126 U/L   Total Bilirubin 1.0 0.3 - 1.2 mg/dL   GFR calc non Af Amer 44 (L) >60 mL/min   GFR calc Af Amer 51 (L) >60 mL/min   Anion gap 9 5 - 15    Comment: Performed at [facility], [place] [address] [place], [place] [address]  CBC WITH DIFFERENTIAL     Status: Abnormal   Collection Time: 01/05/20  2:29 PM  Result Value Ref Range   WBC 24.9 (H) 4.0 - 10.5 K/uL   RBC 3.20 (L) 3.87 - 5.11 MIL/uL   Hemoglobin 9.5 (L) 12.0 - 15.0 g/dL   HCT 31.6 (L) 36.0 - 46.0 %   MCV 98.8 80.0 - 100.0 fL   MCH 29.7 26.0 - 34.0 pg   MCHC 30.1 30.0 - 36.0 g/dL   RDW 20.1 (H) 11.5 - 15.5 %   Platelets 493 (H) 150 - 400 K/uL   nRBC 0.0 0.0 - 0.2 %   Neutrophils Relative % 95 %   Neutro Abs 23.8 (H) 1.7 - 7.7 K/uL   Lymphocytes Relative 1 %   Lymphs Abs 0.2 (L) 0.7 - 4.0 K/uL   Monocytes Relative 3 %   Monocytes Absolute 0.6 0.1 - 1.0 K/uL   Eosinophils Relative 0 %   Eosinophils Absolute 0.0 0.0 - 0.5 K/uL   Basophils Relative 0 %   Basophils Absolute 0.1 0.0 - 0.1 K/uL   Immature Granulocytes 1 %   Abs Immature Granulocytes 0.25 (H) 0.00 - 0.07 K/uL    Comment: Performed at [facility], [place] [address] [place], [place] [address]  APTT     Status: None   Collection Time: 01/05/20  2:29 PM  Result Value Ref Range   aPTT 33 24 - 36 seconds    Comment: Performed at [facility], [place] [address], [place],  [address]  Protime-INR     Status: None   Collection Time: 01/05/20  2:29 PM  Result Value Ref Range   Prothrombin Time 14.8 11.4 - 15.2 seconds   INR 1.2 0.8 - 1.2    Comment: (NOTE) INR goal varies based on device and  disease states. Performed  at [facility], [place] [address] [place], [place] [address]   Procalcitonin - Baseline     Status: None   Collection Time: 01/05/20  2:29 PM  Result Value Ref Range   Procalcitonin <0.10 ng/mL    Comment:        Interpretation: PCT (Procalcitonin) <= 0.5 ng/mL: Systemic infection (sepsis) is not likely. Local bacterial infection is possible. (NOTE)       Sepsis PCT Algorithm           Lower Respiratory Tract                                      Infection PCT Algorithm    ----------------------------     ----------------------------         PCT < 0.25 ng/mL                PCT < 0.10 ng/mL         Strongly encourage             Strongly discourage   discontinuation of antibiotics    initiation of antibiotics    ----------------------------     -----------------------------       PCT 0.25 - 0.50 ng/mL            PCT 0.10 - 0.25 ng/mL               OR       >80% decrease in PCT            Discourage initiation of                                            antibiotics      Encourage discontinuation           of antibiotics    ----------------------------     -----------------------------         PCT >= 0.50 ng/mL              PCT 0.26 - 0.50 ng/mL               AND        <80% decrease in PCT             Encourage initiation of                                             antibiotics       Encourage continuation           of antibiotics    ----------------------------     -----------------------------        PCT >= 0.50 ng/mL                  PCT > 0.50 ng/mL               AND         increase in PCT                  Strongly encourage  initiation of antibiotics    Strongly encourage escalation           of antibiotics                                     -----------------------------                                           PCT <= 0.25 ng/mL                                                 OR                                         > 80% decrease in PCT                                     Discontinue / Do not initiate                                             antibiotics Performed at [place] [address] [place], [place] [address]   Ammonia     Status: None   Collection Time: 01/05/20  2:31 PM  Result Value Ref Range   Ammonia 34 9 - 35 umol/L    Comment: Performed at [facility], [place] [address], [place], [place] [address]  POC SARS Coronavirus 2 Ag-ED - Nasal Swab (BD Veritor Kit)     Status: None   Collection Time: 01/05/20  4:26 PM  Result Value Ref Range   SARS Coronavirus 2 Ag NEGATIVE NEGATIVE    Comment: (NOTE) SARS-CoV-2 antigen NOT DETECTED.  Negative results are presumptive.  Negative results do not preclude SARS-CoV-2 infection and should not be used as the sole basis for treatment or other patient management decisions, including infection  control decisions, particularly in the presence of clinical signs and  symptoms consistent with COVID-19, or in those who have been in contact with the virus.  Negative results must be combined with clinical observations, patient history, and epidemiological information. The expected result is Negative. Fact Sheet for Patients: PodPark.tn Fact Sheet for Healthcare Providers: GiftContent.is This test is not yet approved or cleared by the [place] FDA and  has been authorized for detection and/or diagnosis of SARS-CoV-2 by FDA under an Emergency Use Authorization (EUA).  This EUA will remain in effect (meaning this test can be used) for the duration of  the COVID-19 de claration under Section 564(b)(1) of the Act, 21 U.S.C. section 360bbb-3(b)(1), unless the authorization is terminated or revoked sooner.   Influenza panel by PCR (type A & B)     Status: None   Collection Time: 01/05/20  6:27 PM  Result Value Ref Range    Influenza A By PCR NEGATIVE NEGATIVE   Influenza B By PCR NEGATIVE NEGATIVE  Comment: (NOTE) The Xpert Xpress Flu assay is intended as an aid in the diagnosis of  influenza and should not be used as a sole basis for treatment.  This  assay is FDA approved for nasopharyngeal swab specimens only. Nasal  washings and aspirates are unacceptable for Xpert Xpress Flu testing. Performed at [facility], [place] [address] [place], [place] [address]   Lactic acid, plasma     Status: Abnormal   Collection Time: 01/05/20  6:30 PM  Result Value Ref Range   Lactic Acid, Venous 2.4 (HH) 0.5 - 1.9 mmol/L    Comment: CRITICAL VALUE NOTED.  VALUE IS CONSISTENT WITH PREVIOUSLY REPORTED AND CALLED VALUE. Performed at [facility], [place] [address] [place], [place] [address]    CT Head Wo Contrast  Result Date: 01/05/2020 CLINICAL DATA:  Ataxia, stroke suspected. Altered mental status ([place]), unclear cause. EXAM: CT HEAD WITHOUT CONTRAST TECHNIQUE: Contiguous axial images were obtained from the base of the skull through the vertex without intravenous contrast. COMPARISON:  Brain MRI 12/29/2019, head CT 12/26/2021 FINDINGS: Brain: No evidence of acute intracranial hemorrhage. No demarcated cortical infarction. No evidence of intracranial mass. No midline shift or extra-axial fluid collection. Stable ill-defined hypoattenuation within the cerebral white matter which is nonspecific, but consistent with chronic small vessel ischemic disease. Mild generalized parenchymal atrophy. Vascular: No hyperdense vessel. Skull: Normal. Negative for fracture or focal lesion. Sinuses/Orbits: Visualized orbits demonstrate no acute abnormality. Minimal ethmoid sinus mucosal thickening. No significant mastoid effusion. IMPRESSION: No CT evidence of acute intracranial abnormality. Generalized parenchymal atrophy and chronic small vessel ischemic disease. Electronically Signed   By: Kellie Simmering DO   On:  01/05/2020 17:15   CT ABDOMEN PELVIS W CONTRAST  Result Date: 01/05/2020 CLINICAL DATA:  Abdominal distension. Recent diagnosis of endometrial adenocarcinoma EXAM: CT ABDOMEN AND PELVIS WITH CONTRAST TECHNIQUE: Multidetector CT imaging of the abdomen and pelvis was performed using the standard protocol following bolus administration of intravenous contrast. CONTRAST:  159m OMNIPAQUE IOHEXOL 300 MG/ML  SOLN COMPARISON:  12/24/2019 FINDINGS: Lower chest: No acute abnormality. Hepatobiliary: Liver is within normal limits. Gallbladder is contracted, limiting its evaluation. No gallstone is evident. No biliary dilatation. Pancreas: Unremarkable. No pancreatic ductal dilatation or surrounding inflammatory changes. Spleen: Normal in size without focal abnormality. Adrenals/Urinary Tract: No adrenal nodule. Kidneys enhance symmetrically. No hydronephrosis. Urinary bladder is decompressed, limiting its evaluation. Stomach/Bowel: Marked volume of stool distending the rectum measuring approximately 18 x 8 cm. Scattered sigmoid diverticulosis. No pericolonic inflammatory changes. Normal appendix in the right lower quadrant. No dilated loops of small bowel. Small hiatal hernia. Stomach otherwise unremarkable. Vascular/Lymphatic: Scattered atherosclerotic disease. No acute vascular findings. No abdominal or pelvic lymphadenopathy. Reproductive: Abnormally thickened endometrium. No adnexal masses. Other: There is a trace amount of free fluid within the abdomen and pelvis. No pneumoperitoneum. No abdominal wall hernia. Musculoskeletal: Mild diffuse body wall edema. Severe degenerative changes of the lumbar spine without new or acute osseous finding. Bones are diffusely demineralized. IMPRESSION: 1. Marked volume of stool distending the rectum measuring approximately 18 x 8 cm. 2. Trace amount of free fluid within the abdomen and pelvis. Which could be related to patient fluid status as there is also body wall edema. 3.  Abnormally thickened endometrium, similar to the prior study. 4. Sigmoid diverticulosis without evidence of diverticulitis. 5. Aortic atherosclerosis. Aortic Atherosclerosis (ICD10-I70.0). Electronically Signed   By: NDavina PokeD.O.   On: 01/05/2020 17:22   [facility]  Result Date: 01/05/2020 CLINICAL DATA:  Altered mental status, lethargy and fever. EXAM: PORTABLE CHEST 1 VIEW COMPARISON:  PA and lateral chest 12/24/2019. Single-view of the chest 06/11/2018. FINDINGS: Lungs clear. Heart size normal. No pneumothorax or pleural effusion. Mild elevation of the right hemidiaphragm relative to the left noted. No acute or focal bony abnormality. IMPRESSION: No acute disease. Electronically Signed   By: Inge Rise M.D.   On: 01/05/2020 15:12    Pending Labs Unresulted Labs (From admission, onward)    Start     Ordered   01/06/20 0500  Procalcitonin  Daily,   R     01/05/20 1817   01/05/20 1830  Lactic acid, plasma  Now then every 2 hours,   STAT     01/05/20 1817   01/05/20 1827  SARS CORONAVIRUS 2 (TAT 6-24 HRS) Nasopharyngeal Nasopharyngeal Swab  (Tier 3 (TAT 6-24 hrs))  ONCE - STAT,   STAT    Question Answer Comment  Is this test for diagnosis or screening Screening   Symptomatic for COVID-19 as defined by CDC No   Hospitalized for COVID-19 No   Admitted to ICU for COVID-19 No   Previously tested for COVID-19 Yes   Resident in a congregate (group) care setting No   Employed in healthcare setting No   Pregnant No      01/05/20 1826   01/05/20 1407  Blood Culture (routine x 2)  BLOOD CULTURE X 2,   STAT     01/05/20 1407   01/05/20 1407  Urinalysis, Routine w reflex microscopic  ONCE - STAT,   STAT     01/05/20 1407   01/05/20 1407  Urine culture  ONCE - STAT,   STAT     01/05/20 1407   Signed and Held  Comprehensive metabolic panel  Tomorrow morning,   R     Signed and Held   Signed and Held  CBC  Tomorrow morning,   R     Signed and Held           Vitals/Pain Today's Vitals   01/05/20 2232 01/05/20 2254 01/05/20 2300 01/06/20 0008  BP: (!) 149/58  (!) 146/109 (!) 130/59  Pulse: (!) 106  (!) 106 (!) 102  Resp: (!) 26  (!) 24 17  Temp:  98.8 F (37.1 C)    TempSrc:  Oral    SpO2: 97%  96% 95%  Weight:      PainSc:        Isolation Precautions Airborne and Contact precautions  Medications Medications  clopidogrel (PLAVIX) tablet 75 mg (75 mg Oral Given 01/05/20 2251)  senna-docusate (Senokot-S) tablet 1 tablet (1 tablet Oral Given 01/05/20 2251)  hydrALAZINE (APRESOLINE) injection 10 mg (has no administration in time range)  polyethylene glycol (MIRALAX / GLYCOLAX) packet 17 g (17 g Oral Given 01/05/20 2251)  cefTAZidime (FORTAZ) 2 g in sodium chloride 0.9 % 100 mL IVPB (has no administration in time range)  vancomycin (VANCOREADY) IVPB 1750 mg/350 mL (has no administration in time range)  lactated ringers bolus 1,000 mL (0 mLs Intravenous Stopped 01/05/20 1618)  acetaminophen (TYLENOL) 325 MG tablet (650 mg  Given 01/05/20 1435)  vancomycin (VANCOREADY) IVPB 2000 mg/400 mL (0 mg Intravenous Stopped 01/05/20 1752)  cefTAZidime (FORTAZ) 2 g in sodium chloride 0.9 % 100 mL IVPB (0 g Intravenous Stopped 01/05/20 1626)  metroNIDAZOLE (FLAGYL) IVPB 500 mg (0 mg Intravenous Stopped 01/05/20 2019)  0.9 %  sodium chloride infusion ( Intravenous Stopped 01/05/20  2351)  sodium chloride 0.9 % bolus 1,000 mL (0 mLs Intravenous Stopped 01/05/20 1827)  iohexol (OMNIPAQUE) 300 MG/ML solution 80 mL (100 mLs Intravenous Contrast Given 01/05/20 1639)  sodium chloride (PF) 0.9 % injection (  Given by Other 01/05/20 1639)    Mobility non-ambulatory

## 2020-01-06 NOTE — ED Notes (Signed)
Attempted to call daughter for update, but no answer.

## 2020-01-06 NOTE — ED Notes (Signed)
Daughter provided update and given number to nurses station on 4E. Patient has no complaints at this time.

## 2020-01-06 NOTE — Progress Notes (Signed)
PROGRESS NOTE  Connie Miranda [id number] DOB: 1933/05/03 DOA: 01/05/2020 PCP: [name], MD  HPI/Recap of past 24 hours: Connie Miranda is a 86 y.o. female with medical history significant for morbid obesity, prior CVA, hypertension, chronic anemia, G6PD deficiency, bilateral lower extremity lymphedema, history of pemphigus, recent vulvar abscess, dysphagia, debility/bedbound for the last 5 years, brought to the ED with altered mental status/encephalopathy with fever as high as 103.  Of note, patient was recently admitted on 12/24/2019 and discharged on 01/04/2020 for almost exactly the same similar issues (encephalopathy plus fever).  During the last admission, patient had a retained old IUD and possible endometritis, started on broad-spectrum antibiotics and completed 10 days.  GYN was consulted during that admission, had D&C removal of old IUD.  Patient was subsequently diagnosed with endometrial adenocarcinoma.  Patient overall got better, and eventually discharged.  Spoke to daughter who reported that patient was having some chills, otherwise no other complaints. PTA, daughter noticed that she was confused, felt warm as well as noted that she had vomited on herself.  Daughter called EMS and patient was transported to the ED for further management. In the ED, patient was noted to be febrile, tachycardic, saturating above 95 on room air.  Labs notable for elevated lactic acid at 2.8, WBC elevated at 24.9, Chest x-ray with no acute disease, CT abdomen showed marked volume of stool distending the rectum measuring about 18 x 8 cm, sigmoid diverticulosis without evidence of diverticulitis, CT head showed no evidence of acute intracranial abnormality.  Code sepsis was activated, patient was started on broad-spectrum IV antibiotics, IV fluids, and admitted for further management.     Today, patient noted to be more oriented, less confused.  Knows where she is, date of birth, who she is.  Requesting  to drink some water and juice.  Denies any chest pain, abdominal pain, shortness of breath, nausea/vomiting, fever/chills.  Assessment/Plan: Principal Problem:   Sepsis ([place]) Active Problems:   Hypertension   Stroke ([place])   Glucose-6-phosphate dehydrogenase deficiency   Lymphedema   Morbid obesity ([place])   Endometrial carcinoma ([place])   Pressure injury of skin   Sepsis Unknown etiology, suspect possible ??aspiration pneumonia vs endometritis vs UTI  Febrile, tachycardia, lactic acidosis on admission Currently afebrile, with leukocytosis Saturating well on room air Lactic acid elevated, will trend UA/UC pending collection (difficultly even with multiple attempts at In-N-Out cath due to body habitus, patient incontinent) BC x2 NGTD POC and PCR Covid test negative Procalcitonin initially negative, trended up to 21.92 Initial and repeat chest x-ray unremarkable CT abdomen/pelvis showed marked volume of stool distending the rectum measuring about 18 x 8 cm, sigmoid diverticulosis without evidence of diverticulitis Continue IV fluids, broad-spectrum antibiotics by EDP Continue IV vancomycin, ceftazidime SLP evaluated patient, mild risk of aspiration Monitor closely on telemetry  Acute metabolic encephalopathy Improving Likely due to above CT head unremarkable Monitor closely  Constipation Has had about 2 BMs since admission CT abdomen/pelvis showed marked volume of stool distending the rectum measuring about 18 x 8 cm Status post tapwater enema with good results Senna, MiraLAX  History of bullous pemphigoid/bilateral lymphedema/bilateral chronic venous stasis changes No signs of acute infection Not on any steroids or immunomodulators Monitor closely  Essential hypertension BP stable Restart amlodipine, Imdur IV hydralazine as needed  Chronic diastolic HF Difficult to assess fluid status due to body habitus and chronic lymphedema Monitor closely  Anemia of  chronic disease Hemoglobin around baseline Noted to have some  vaginal bleed Daily CBC  History of CVA Patient is bedbound over 5 years Continue home Plavix  Endometrioid adenocarcinoma, Figo grade 2 CT abdomen/pelvis and transvaginal ultrasound revealed old retained IUD endometrial thickening to 19.1 mm Underwent D&C with removal of old IUD and biopsy that revealed above GYN oncology consulted on last admission, recommended follow-up as an outpatient to discuss further management, not a surgical candidate, looking more towords progesterone IUD in the future  Debility/physical deconditioning Bedbound May need SNF this time around  Morbid obesity Lifestyle modification advised         Malnutrition Type:      Malnutrition Characteristics:      Nutrition Interventions:       Estimated body mass index is 44.12 kg/m as calculated from the following:   Height as of this encounter: 5\' 4"  (1.626 m).   Weight as of this encounter: 116.6 kg.     Code Status: Full  Family Communication: Discussed with daughter on 01/06/2020  Disposition Plan: To be determined   Consultants:  None  Procedures:  None  Antimicrobials:  Vancomycin  Ceftazidime  DVT prophylaxis: Heparin   Objective: Vitals:   01/06/20 0232 01/06/20 0631 01/06/20 1340 01/06/20 1357  BP: (!) 156/81 (!) 154/68 (!) 139/105 (!) 158/76  Pulse: (!) 102 99 93 88  Resp: 20 20 16    Temp: 97.9 F (36.6 C) 97.9 F (36.6 C) 97.9 F (36.6 C)   TempSrc: Oral Oral Oral   SpO2: 97% 99% 97%   Weight: 116.6 kg     Height: 5\' 4"  (1.626 m)       Intake/Output Summary (Last 24 hours) at 01/06/2020 1509 Last data filed at 01/06/2020 0600 Gross per 24 hour  Intake 3701.1 ml  Output --  Net 3701.1 ml   Filed Weights   01/05/20 1407 01/06/20 0232  Weight: 110 kg 116.6 kg    Exam:  General: NAD, chronically ill-appearing, alert, awake, oriented  Cardiovascular: S1, S2 present  Respiratory:   Diminished breath sounds bilaterally  Abdomen: Soft, nontender, nondistended, bowel sounds present  Musculoskeletal: Chronic lymphedema noted  Skin:  Chronic venous skin changes noted bilaterally  Psychiatry: Normal mood   Data Reviewed: CBC: Recent Labs  Lab 01/02/20 0544 01/03/20 0520 01/04/20 0525 01/05/20 1429 01/06/20 0415  WBC 8.2 8.6 8.8 24.9* 19.1*  NEUTROABS 5.5 7.4 6.2 23.8*  --   HGB 8.8* 8.3* 8.7* 9.5* 8.2*  HCT 27.2* 27.3* 28.9* 31.6* 27.8*  MCV 93.2 96.8 99.3 98.8 102.2*  PLT 488* 471* 452* 493* 123XX123*   Basic Metabolic Panel: Recent Labs  Lab 01/01/20 0631 01/02/20 0544 01/03/20 0520 01/05/20 1429 01/06/20 0415  NA 142 141 137 141 139  K 3.0* 3.1* 3.8 4.3 4.3  CL 111 113* 109 110 111  CO2 23 23 22 22  21*  GLUCOSE 113* 106* 183* 128* 133*  BUN 7* 7* 8 12 15   CREATININE 0.63 0.73 0.81 1.15* 1.16*  CALCIUM 8.6* 8.3* 8.3* 8.4* 7.9*   GFR: Estimated Creatinine Clearance: 45.3 mL/min (A) (by C-G formula based on SCr of 1.16 mg/dL (H)). Liver Function Tests: Recent Labs  Lab 01/05/20 1429 01/06/20 0415  AST 81* 60*  ALT 23 22  ALKPHOS 172* 121  BILITOT 1.0 1.0  PROT 6.9 6.0*  ALBUMIN 2.5* 2.1*   No results for input(s): LIPASE, AMYLASE in the last 168 hours. Recent Labs  Lab 01/05/20 1431  AMMONIA 34   Coagulation Profile: Recent Labs  Lab 01/05/20 1429  INR 1.2  Cardiac Enzymes: No results for input(s): CKTOTAL, CKMB, CKMBINDEX, TROPONINI in the last 168 hours. BNP (last 3 results) No results for input(s): PROBNP in the last 8760 hours. HbA1C: No results for input(s): HGBA1C in the last 72 hours. CBG: No results for input(s): GLUCAP in the last 168 hours. Lipid Profile: No results for input(s): CHOL, HDL, LDLCALC, TRIG, CHOLHDL, LDLDIRECT in the last 72 hours. Thyroid Function Tests: No results for input(s): TSH, T4TOTAL, FREET4, T3FREE, THYROIDAB in the last 72 hours. Anemia Panel: No results for input(s): VITAMINB12,  FOLATE, FERRITIN, TIBC, IRON, RETICCTPCT in the last 72 hours. Urine analysis:    Component Value Date/Time   COLORURINE ORANGE (A) 12/24/2019 1030   APPEARANCEUR TURBID (A) 12/24/2019 1030   LABSPEC >1.030 (H) 12/24/2019 1030   PHURINE 5.0 12/24/2019 1030   GLUCOSEU NEGATIVE 12/24/2019 1030   HGBUR LARGE (A) 12/24/2019 1030   BILIRUBINUR MODERATE (A) 12/24/2019 1030   KETONESUR 15 (A) 12/24/2019 1030   PROTEINUR 100 (A) 12/24/2019 1030   UROBILINOGEN 1.0 04/09/2013 0151   NITRITE POSITIVE (A) 12/24/2019 1030   LEUKOCYTESUR TRACE (A) 12/24/2019 1030   Sepsis Labs: @LABRCNTIP (procalcitonin:4,lacticidven:4)  ) Recent Results (from the past 240 hour(s))  Blood Culture (routine x 2)     Status: None (Preliminary result)   Collection Time: 01/05/20  2:29 PM   Specimen: BLOOD LEFT ARM  Result Value Ref Range Status   Specimen Description   Final    BLOOD LEFT ARM Performed at [facility], [place] [address] [place], [place] [address]    Special Requests   Final    BOTTLES DRAWN AEROBIC AND ANAEROBIC Blood Culture adequate volume Performed at [facility] [address], [place], [place] [address]    Culture   Final    NO GROWTH < 24 HOURS Performed at [facility] [address], [place], [place] [address]    Report Status PENDING  Incomplete  Blood Culture (routine x 2)     Status: None (Preliminary result)   Collection Time: 01/05/20  2:30 PM   Specimen: Left Antecubital; Blood  Result Value Ref Range Status   Specimen Description   Final    LEFT ANTECUBITAL Performed at [facility] [address], [place], [place] [address]    Special Requests   Final    BOTTLES DRAWN AEROBIC AND ANAEROBIC Blood Culture adequate volume Performed at [facility] [address] [place], [place] [address]    Culture   Final    NO GROWTH < 24 HOURS Performed at [facility] [address],  [place], [place] [address]    Report Status PENDING  Incomplete  SARS CORONAVIRUS 2 (TAT 6-24 HRS) Nasopharyngeal Nasopharyngeal Swab     Status: None   Collection Time: 01/05/20  6:27 PM   Specimen: Nasopharyngeal Swab  Result Value Ref Range Status   SARS Coronavirus 2 NEGATIVE NEGATIVE Final    Comment: (NOTE) SARS-CoV-2 target nucleic acids are NOT DETECTED. The SARS-CoV-2 RNA is generally detectable in upper and lower respiratory specimens during the acute phase of infection. Negative results do not preclude SARS-CoV-2 infection, do not rule out co-infections with other pathogens, and should not be used as the sole basis for treatment or other patient management decisions. Negative results must be combined with clinical observations, patient history, and epidemiological information. The expected result is Negative. Fact Sheet for Patients: SugarRoll.be Fact Sheet for Healthcare Providers: https://www.woods-mathews.com/ This test is  not yet approved or cleared by the [place] and  has been authorized for detection and/or diagnosis of SARS-CoV-2 by FDA under an Emergency Use Authorization (EUA). This EUA will remain  in effect (meaning this test can be used) for the duration of the COVID-19 declaration under Section 56 4(b)(1) of the Act, 21 U.S.C. section 360bbb-3(b)(1), unless the authorization is terminated or revoked sooner. Performed at [facility], [place] [address], [place], [place] [address]       Studies: CT Head Wo Contrast  Result Date: 01/05/2020 CLINICAL DATA:  Ataxia, stroke suspected. Altered mental status (AMS), unclear cause. EXAM: CT HEAD WITHOUT CONTRAST TECHNIQUE: Contiguous axial images were obtained from the base of the skull through the vertex without intravenous contrast. COMPARISON:  Brain MRI 12/29/2019, head CT 12/26/2021 FINDINGS: Brain: No evidence of acute intracranial hemorrhage. No demarcated  cortical infarction. No evidence of intracranial mass. No midline shift or extra-axial fluid collection. Stable ill-defined hypoattenuation within the cerebral white matter which is nonspecific, but consistent with chronic small vessel ischemic disease. Mild generalized parenchymal atrophy. Vascular: No hyperdense vessel. Skull: Normal. Negative for fracture or focal lesion. Sinuses/Orbits: Visualized orbits demonstrate no acute abnormality. Minimal ethmoid sinus mucosal thickening. No significant mastoid effusion. IMPRESSION: No CT evidence of acute intracranial abnormality. Generalized parenchymal atrophy and chronic small vessel ischemic disease. Electronically Signed   By: Kellie Simmering DO   On: 01/05/2020 17:15   CT ABDOMEN PELVIS W CONTRAST  Result Date: 01/05/2020 CLINICAL DATA:  Abdominal distension. Recent diagnosis of endometrial adenocarcinoma EXAM: CT ABDOMEN AND PELVIS WITH CONTRAST TECHNIQUE: Multidetector CT imaging of the abdomen and pelvis was performed using the standard protocol following bolus administration of intravenous contrast. CONTRAST:  151mL OMNIPAQUE IOHEXOL 300 MG/ML  SOLN COMPARISON:  12/24/2019 FINDINGS: Lower chest: No acute abnormality. Hepatobiliary: Liver is within normal limits. Gallbladder is contracted, limiting its evaluation. No gallstone is evident. No biliary dilatation. Pancreas: Unremarkable. No pancreatic ductal dilatation or surrounding inflammatory changes. Spleen: Normal in size without focal abnormality. Adrenals/Urinary Tract: No adrenal nodule. Kidneys enhance symmetrically. No hydronephrosis. Urinary bladder is decompressed, limiting its evaluation. Stomach/Bowel: Marked volume of stool distending the rectum measuring approximately 18 x 8 cm. Scattered sigmoid diverticulosis. No pericolonic inflammatory changes. Normal appendix in the right lower quadrant. No dilated loops of small bowel. Small hiatal hernia. Stomach otherwise unremarkable. Vascular/Lymphatic:  Scattered atherosclerotic disease. No acute vascular findings. No abdominal or pelvic lymphadenopathy. Reproductive: Abnormally thickened endometrium. No adnexal masses. Other: There is a trace amount of free fluid within the abdomen and pelvis. No pneumoperitoneum. No abdominal wall hernia. Musculoskeletal: Mild diffuse body wall edema. Severe degenerative changes of the lumbar spine without new or acute osseous finding. Bones are diffusely demineralized. IMPRESSION: 1. Marked volume of stool distending the rectum measuring approximately 18 x 8 cm. 2. Trace amount of free fluid within the abdomen and pelvis. Which could be related to patient fluid status as there is also body wall edema. 3. Abnormally thickened endometrium, similar to the prior study. 4. Sigmoid diverticulosis without evidence of diverticulitis. 5. Aortic atherosclerosis. Aortic Atherosclerosis (ICD10-I70.0). Electronically Signed   By: Davina Poke D.O.   On: 01/05/2020 17:22   DG Chest Port 1 View  Result Date: 01/06/2020 CLINICAL DATA:  Sepsis EXAM: PORTABLE CHEST 1 VIEW COMPARISON:  Chest radiograph 01/05/2020 FINDINGS: Heart size within normal limits. No airspace consolidation. No evidence of pleural effusion or pneumothorax. No acute bony abnormality. Overlying cardiac monitoring leads. IMPRESSION: No evidence of  acute cardiopulmonary abnormality. Electronically Signed   By: Kellie Simmering DO   On: 01/06/2020 08:10    Scheduled Meds: . clopidogrel  75 mg Oral Daily  . heparin  5,000 Units Subcutaneous Q8H  . [START ON 01/07/2020] polyethylene glycol  17 g Oral Daily  . [START ON 01/07/2020] senna-docusate  1 tablet Oral QHS    Continuous Infusions: . sodium chloride Stopped (01/06/20 0542)  . sodium chloride 75 mL/hr at 01/06/20 0859  . cefTAZidime (FORTAZ)  IV Stopped (01/06/20 0542)  . [START ON 01/07/2020] vancomycin       LOS: 1 day     Alma Friendly, MD [facility]  If 7PM-7AM, please contact  night-coverage [email or web address] 01/06/2020, 3:09 PM

## 2020-01-06 NOTE — Progress Notes (Signed)
MD with order written for urinalysis and culture. Pt incontinent of urine with purewick in place. MD requested I/O cath to collect specimen. Pt with obesity and edema and swelling to bilateral lower extremities. RN and 2 NTs attempted to I/O cath pt per MD request for specimen however Pt unable to tolerate legs being bent  at the knees or separated attempt made x2 and unsuccessful. Will notify MD

## 2020-01-06 NOTE — Progress Notes (Signed)
In 15 minutes please call report to Nurse [place] at phone number: [phone]. Thank you.

## 2020-01-06 NOTE — TOC Initial Note (Signed)
Transition of Care [facility]) - Initial/Assessment Note    Patient Details  Name: Connie Miranda MRN: [id number] Date of Birth: 12/27/1933  [facility] CM/SW Contact:    Connie Phi, RN Phone Number: 01/06/2020, 2:52 PM  Clinical Narrative: Per dtr patient bed bound has hospital bed-has issues w/hospital bed-unsure of dme agency-will check w/[facility]-rep Connie Miranda will check. D/c plans are home-has private duty care @ night;caring hands aide during day. Has traveling dr Connie Miranda [phone].Will need PTAR @ d/c-address confirmed.                  Expected Discharge Plan: Home/Self Care Barriers to Discharge: Continued Medical Work up   Patient Goals and CMS Choice Patient states their goals for this hospitalization and ongoing recovery are:: go home CMS Medicare.gov Compare Post Acute Care list provided to:: Patient Represenative (must comment)(dtr Connie Miranda)    Expected Discharge Plan and Services Expected Discharge Plan: Home/Self Care   Discharge Planning Services: CM Consult   Living arrangements for the past 2 months: Single Family Home                                      Prior Living Arrangements/Services Living arrangements for the past 2 months: Single Family Home Lives with:: Adult Children Patient language and need for interpreter reviewed:: No Do you feel safe going back to the place where you live?: Yes      Need for Family Participation in Patient Care: No (Comment) Care giver support system in place?: Yes (comment) Current home services: DME(hospital bed) Criminal Activity/Legal Involvement Pertinent to Current Situation/Hospitalization: No - Comment as needed  Activities of Daily Living Home Assistive Devices/Equipment: Wheelchair, Environmental consultant (specify type), Cane (specify quad or straight), Bedside commode/3-in-1, [employer] fast streamer ADL Screening (condition at time of admission) Patient's cognitive ability adequate to safely complete daily  activities?: Yes Is the patient deaf or have difficulty hearing?: No Does the patient have difficulty seeing, even when wearing glasses/contacts?: No Does the patient have difficulty concentrating, remembering, or making decisions?: Yes Patient able to express need for assistance with ADLs?: Yes Does the patient have difficulty dressing or bathing?: Yes Independently performs ADLs?: No Communication: Independent Dressing (OT): Needs assistance Is this a change from baseline?: Pre-admission baseline Grooming: Needs assistance Is this a change from baseline?: Pre-admission baseline Feeding: Independent Is this a change from baseline?: Pre-admission baseline Bathing: Needs assistance Is this a change from baseline?: Pre-admission baseline Toileting: Needs assistance Is this a change from baseline?: Pre-admission baseline In/Out Bed: Needs assistance Is this a change from baseline?: Pre-admission baseline Walks in Home: Independent with device (comment) Is this a change from baseline?: Pre-admission baseline Does the patient have difficulty walking or climbing stairs?: No Weakness of Legs: Both Weakness of Arms/Hands: None  Permission Sought/Granted Permission sought to share information with : Case Manager Permission granted to share information with : Yes, Verbal Permission Granted  Share Information with NAME: Connie Miranda dtr [phone]           Emotional Assessment Appearance:: Appears stated age Attitude/Demeanor/Rapport: Gracious Affect (typically observed): Accepting Orientation: : Oriented to Self, Oriented to Place, Oriented to  Time, Oriented to Situation Alcohol / Substance Use: Not Applicable Psych Involvement: No (comment)  Admission diagnosis:  Sepsis ([place]) [A41.9] Sepsis, due to unspecified organism, unspecified whether acute organ dysfunction present [facility]) [A41.9] Patient Active Problem List   Diagnosis Date  Noted  . Pressure injury of skin 01/06/2020  .  Endometrial carcinoma ([place]) 01/03/2020  . Thickened endometrium   . Shock circulatory ([place]) 12/24/2019  . Chronic diastolic CHF (congestive heart failure) ([place]) 06/10/2018  . Sepsis ([place]) 06/10/2018  . Acute respiratory failure with hypoxia ([place]) 06/10/2018  . Hypokalemia 06/10/2018  . Acute bronchitis 06/10/2018  . Lower extremity weakness 02/11/2016  . Difficulty walking 04/08/2013  . Weakness of both legs 04/08/2013  . Morbid obesity ([place]) 04/08/2013  . AKI (acute kidney injury) ([place]) 01/28/2013  . Hyperkalemia 01/28/2013  . Fall 01/28/2013  . Nausea 01/28/2013  . Lymphedema 10/16/2012  . Cellulitis 10/16/2012  . Diarrhea 10/13/2012  . Cellulitis of left lower leg 10/12/2012  . Anemia 10/12/2012  . Abdominal pain 10/12/2012  . Hip pain 10/12/2012  . Hypertension   . Edema   . Pernicious anemia   . Glucose-6-phosphate dehydrogenase deficiency   . Osteoarthritis   . Stroke ([place]) 09/28/2000   PCP:  Connie Ebbs, MD Pharmacy:   [facility], [place] [address] Phone: [phone] Fax: [phone]     Social Determinants of Health (SDOH) Interventions    Readmission Risk Interventions Readmission Risk Prevention Plan 01/06/2020  Transportation Screening Complete  PCP or Specialist Appt within 3-5 Days Complete  HRI or [place] Complete  Social Work Consult for [place] Planning/Counseling Complete  Palliative Care Screening Not Complete  Medication Review Press photographer) Complete  Some recent data might be hidden

## 2020-01-06 NOTE — Evaluation (Signed)
Clinical/Bedside Swallow Evaluation Patient Details  Name: Connie Miranda MRN: [id number] Date of Birth: 1933/12/03  Today's Date: 01/06/2020 Time: SLP Start Time (ACUTE ONLY): 1018 SLP Stop Time (ACUTE ONLY): 1036 SLP Time Calculation (min) (ACUTE ONLY): 18 min  Past Medical History:  Past Medical History:  Diagnosis Date  . Edema   . Edema of both legs 01/2016  . Glucose-6-phosphate dehydrogenase deficiency    possible diagnosis of G6PD per patient "phosphate 6 deficiency"  . Hypertension   . Osteoarthritis   . Pernicious anemia   . Stroke [facility]) 09/2000   couldn't move her left foot which resolved   Past Surgical History:  Past Surgical History:  Procedure Laterality Date  . BONE MARROW BIOPSY  12/1966   for anemia  . IR PERC CHOLECYSTOSTOMY  04/08/2017  . IR RADIOLOGIST EVAL & MGMT  05/20/2017  . IR SINUS/FIST TUBE CHK-NON GI  06/11/2017  . MULTIPLE TOOTH EXTRACTIONS    . OTHER SURGICAL HISTORY     cyst from panty line removed as child   HPI:  Connie Miranda is a 86 y.o. female with medical history significant for dysphagia (no ST notes), morbid obesity, endometrial adenocarcinoma (dx recent visti), CVA (2001, hypertension, chronic anemia, bilateral lower extremity lymphedema, history of pemphigus, recent vulvar abscess, dysphagia, debility/bedbound for the last 5 years, brought to the ED with altered mental status/encephalopathy with fever as high as 103.  Chest x-ray with no acute disease,. Code sepis activated.    Assessment / Plan / Recommendation Clinical Impression  Pt's oral-motor ability, vocal quality all within normal limits- less effort noted with volitional cough. Delayed throat clears present with thin x 2 during session. No overt cough- she denies coughing with liquids or food. Although endentulous mastication and transit with regular texture was functional and did not appear to need chopped meats to which she politely declined. Recommend regular, thin, pills with  water and esophageal precautions (higher risk GER due to body habitus).     SLP Visit Diagnosis: Dysphagia, unspecified (R13.10)    Aspiration Risk  Mild aspiration risk    Diet Recommendation Regular;Thin liquid   Liquid Administration via: Cup;Straw Medication Administration: Whole meds with liquid Supervision: Patient able to self feed Compensations: Slow rate;Small sips/bites Postural Changes: Seated upright at 90 degrees    Other  Recommendations Oral Care Recommendations: Oral care BID   Follow up Recommendations None      Frequency and Duration            Prognosis        Swallow Study   General HPI: Connie Miranda is a 86 y.o. female with medical history significant for dysphagia (no ST notes), morbid obesity, endometrial adenocarcinoma (dx recent visti), CVA (2001, hypertension, chronic anemia, bilateral lower extremity lymphedema, history of pemphigus, recent vulvar abscess, dysphagia, debility/bedbound for the last 5 years, brought to the ED with altered mental status/encephalopathy with fever as high as 103.  Chest x-ray with no acute disease,. Code sepis activated.  Previous Swallow Assessment: (none found) Diet Prior to this Study: NPO Temperature Spikes Noted: No Respiratory Status: Room air History of Recent Intubation: No Behavior/Cognition: Alert;Cooperative;Pleasant mood Oral Cavity Assessment: Within Functional Limits Oral Care Completed by SLP: No Oral Cavity - Dentition: Edentulous Vision: Impaired for self-feeding Self-Feeding Abilities: Needs assist Patient Positioning: Upright in bed Baseline Vocal Quality: Normal Volitional Cough: Weak Volitional Swallow: Able to elicit    Oral/Motor/Sensory Function Overall Oral Motor/Sensory Function: Within functional limits   Ice  Chips Ice chips: Not tested   Thin Liquid Thin Liquid: Impaired Presentation: Cup;Straw Pharyngeal  Phase Impairments: Throat Clearing - Delayed;Wet Vocal Quality    Nectar  Thick Nectar Thick Liquid: Not tested   Honey Thick Honey Thick Liquid: Not tested   Puree Puree: Within functional limits   Solid     Solid: Within functional limits      Houston Siren 01/06/2020,11:03 AM  Orbie Pyo Colvin Caroli.Ed Risk analyst [phone] Office [phone]

## 2020-01-06 NOTE — Progress Notes (Signed)
Healthcare Power of Attorney and Living Will for patient brought in by daughter, Mollie Germany, at around 22. Placed in patient's chart. A copy will be given to dayshift RN on 01-07-20.

## 2020-01-07 LAB — CBC WITH DIFFERENTIAL/PLATELET
Abs Immature Granulocytes: 0.07 10*3/uL (ref 0.00–0.07)
Basophils Absolute: 0 10*3/uL (ref 0.0–0.1)
Basophils Relative: 0 %
Eosinophils Absolute: 0.3 10*3/uL (ref 0.0–0.5)
Eosinophils Relative: 3 %
HCT: 27.3 % — ABNORMAL LOW (ref 36.0–46.0)
Hemoglobin: 8 g/dL — ABNORMAL LOW (ref 12.0–15.0)
Immature Granulocytes: 1 %
Lymphocytes Relative: 5 %
Lymphs Abs: 0.6 10*3/uL — ABNORMAL LOW (ref 0.7–4.0)
MCH: 30.3 pg (ref 26.0–34.0)
MCHC: 29.3 g/dL — ABNORMAL LOW (ref 30.0–36.0)
MCV: 103.4 fL — ABNORMAL HIGH (ref 80.0–100.0)
Monocytes Absolute: 0.4 10*3/uL (ref 0.1–1.0)
Monocytes Relative: 4 %
Neutro Abs: 10.2 10*3/uL — ABNORMAL HIGH (ref 1.7–7.7)
Neutrophils Relative %: 87 %
Platelets: 359 10*3/uL (ref 150–400)
RBC: 2.64 MIL/uL — ABNORMAL LOW (ref 3.87–5.11)
RDW: 20.4 % — ABNORMAL HIGH (ref 11.5–15.5)
WBC: 11.7 10*3/uL — ABNORMAL HIGH (ref 4.0–10.5)
nRBC: 0 % (ref 0.0–0.2)

## 2020-01-07 LAB — BASIC METABOLIC PANEL
Anion gap: 5 (ref 5–15)
BUN: 20 mg/dL (ref 8–23)
CO2: 21 mmol/L — ABNORMAL LOW (ref 22–32)
Calcium: 7.8 mg/dL — ABNORMAL LOW (ref 8.9–10.3)
Chloride: 114 mmol/L — ABNORMAL HIGH (ref 98–111)
Creatinine, Ser: 0.97 mg/dL (ref 0.44–1.00)
GFR calc Af Amer: >60 mL/min (ref >60)
GFR calc non Af Amer: 54 mL/min — ABNORMAL LOW (ref >60)
Glucose, Bld: 91 mg/dL (ref 70–99)
Potassium: 3.7 mmol/L (ref 3.5–5.1)
Sodium: 140 mmol/L (ref 135–145)

## 2020-01-07 LAB — LACTIC ACID, PLASMA: Lactic Acid, Venous: 1.3 mmol/L (ref 0.5–1.9)

## 2020-01-07 LAB — PROCALCITONIN: Procalcitonin: 12.29 ng/mL

## 2020-01-07 NOTE — Progress Notes (Addendum)
PROGRESS NOTE  Connie Miranda [id number] DOB: [birth year] DOA: 01/05/2020 PCP: [name], MD  HPI/Recap of past 24 hours: Connie Miranda is a 86 y.o. female with medical history significant for morbid obesity, prior CVA, hypertension, chronic anemia, G6PD deficiency, bilateral lower extremity lymphedema, history of pemphigus, recent vulvar abscess, dysphagia, debility/bedbound for the last 5 years, brought to the ED with altered mental status/encephalopathy with fever as high as 103.  Of note, patient was recently admitted on 12/24/2019 and discharged on 01/04/2020 for almost exactly the same similar issues (encephalopathy plus fever).  During the last admission, patient had a retained old IUD and possible endometritis, started on broad-spectrum antibiotics and completed 10 days.  GYN was consulted during that admission, had D&C removal of old IUD.  Patient was subsequently diagnosed with endometrial adenocarcinoma.  Patient overall got better, and eventually discharged.  Spoke to daughter who reported that patient was having some chills, otherwise no other complaints. PTA, daughter noticed that she was confused, felt warm as well as noted that she had vomited on herself.  Daughter called EMS and patient was transported to the ED for further management. In the ED, patient was noted to be febrile, tachycardic, saturating above 95 on room air.  Labs notable for elevated lactic acid at 2.8, WBC elevated at 24.9, Chest x-ray with no acute disease, CT abdomen showed marked volume of stool distending the rectum measuring about 18 x 8 cm, sigmoid diverticulosis without evidence of diverticulitis, CT head showed no evidence of acute intracranial abnormality.  Code sepsis was activated, patient was started on broad-spectrum IV antibiotics, IV fluids, and admitted for further management.     Today, patient denies any new complaints, mentation gradually coming back to baseline.     Assessment/Plan:  Principal Problem:   Sepsis ([place]) Active Problems:   Hypertension   Stroke ([place])   Glucose-6-phosphate dehydrogenase deficiency   Lymphedema   Morbid obesity ([place])   Endometrial carcinoma ([place])   Pressure injury of skin   Sepsis Unknown etiology, suspect possible ??aspiration pneumonia vs endometritis vs UTI  Febrile, tachycardia, lactic acidosis on admission Currently afebrile, with leukocytosis Saturating well on room air Lactic acid elevated, will trend UC pending  BC x2 NGTD POC and PCR Covid test negative Procalcitonin initially negative, trended up to 21.92--> 12.29 Initial and repeat chest x-ray unremarkable CT abdomen/pelvis showed marked volume of stool distending the rectum measuring about 18 x 8 cm, sigmoid diverticulosis without evidence of diverticulitis Continue IV vancomycin, ceftazidime SLP evaluated patient, mild risk of aspiration Monitor closely on telemetry  Acute metabolic encephalopathy Improving Likely due to above CT head unremarkable Monitor closely  Constipation Had BMs since admission CT abdomen/pelvis showed marked volume of stool distending the rectum measuring about 18 x 8 cm Status post tapwater enema with good results Senna, MiraLAX  History of bullous pemphigoid/bilateral lymphedema/bilateral chronic venous stasis changes No signs of acute infection Not on any steroids or immunomodulators Monitor closely  Essential hypertension BP stable Restart amlodipine, Imdur IV hydralazine as needed  Chronic diastolic HF Difficult to assess fluid status due to body habitus and chronic lymphedema Monitor closely  Anemia of chronic disease Hemoglobin around baseline Noted to have some vaginal bleed Daily CBC  History of CVA Patient is bedbound over 5 years Continue home Plavix  Endometrioid adenocarcinoma, Figo grade 2 CT abdomen/pelvis and transvaginal ultrasound revealed old retained IUD endometrial thickening to 19.1 mm  Underwent D&C with removal of old IUD and biopsy that revealed  above GYN oncology consulted on last admission, recommended follow-up as an outpatient to discuss further management, not a surgical candidate, looking more towords progesterone IUD in the future  Debility/physical deconditioning Bedbound   Morbid obesity Lifestyle modification advised         Malnutrition Type:      Malnutrition Characteristics:      Nutrition Interventions:       Estimated body mass index is 44.12 kg/m as calculated from the following:   Height as of this encounter: 5\' 4"  (1.626 m).   Weight as of this encounter: 116.6 kg.     Code Status: Full  Family Communication: Discussed with daughter on 01/07/2020, confirmed DNR status  Disposition Plan: Bedbound, plan to d/c home   Consultants:  None  Procedures:  None  Antimicrobials:  Vancomycin  Ceftazidime  DVT prophylaxis: Heparin   Objective: Vitals:   01/06/20 1340 01/06/20 1357 01/06/20 2025 01/07/20 0547  BP: (!) 139/105 (!) 158/76 136/65 140/62  Pulse: 93 88 99 82  Resp: 16  16 16   Temp: 97.9 F (36.6 C)  98.9 F (37.2 C) 98.1 F (36.7 C)  TempSrc: Oral  Oral Oral  SpO2: 97%   100%  Weight:      Height:        Intake/Output Summary (Last 24 hours) at 01/07/2020 1414 Last data filed at 01/07/2020 0356 Gross per 24 hour  Intake 2197.76 ml  Output 275 ml  Net 1922.76 ml   Filed Weights   01/05/20 1407 01/06/20 0232  Weight: 110 kg 116.6 kg    Exam:  General: NAD, chronically ill-appearing, elderly, oriented  Cardiovascular: S1, S2 present  Respiratory:  Diminished breath sounds bilaterally  Abdomen: Soft, nontender, nondistended, obese, bowel sounds present  Musculoskeletal: Chronic lymphedema noted  Skin:  Chronic venous skin changes noted bilaterally  Psychiatry: Normal mood   Data Reviewed: CBC: Recent Labs  Lab 01/02/20 0544 01/03/20 0520 01/04/20 0525 01/05/20 1429 01/06/20  0415 01/07/20 0458  WBC 8.2 8.6 8.8 24.9* 19.1* 11.7*  NEUTROABS 5.5 7.4 6.2 23.8*  --  10.2*  HGB 8.8* 8.3* 8.7* 9.5* 8.2* 8.0*  HCT 27.2* 27.3* 28.9* 31.6* 27.8* 27.3*  MCV 93.2 96.8 99.3 98.8 102.2* 103.4*  PLT 488* 471* 452* 493* 426* [id number]   Basic Metabolic Panel: Recent Labs  Lab 01/02/20 0544 01/03/20 0520 01/05/20 1429 01/06/20 0415 01/07/20 0458  NA 141 137 141 139 140  K 3.1* 3.8 4.3 4.3 3.7  CL 113* 109 110 111 114*  CO2 23 22 22  21* 21*  GLUCOSE 106* 183* 128* 133* 91  BUN 7* 8 12 15 20   CREATININE 0.73 0.81 1.15* 1.16* 0.97  CALCIUM 8.3* 8.3* 8.4* 7.9* 7.8*   GFR: Estimated Creatinine Clearance: 54.2 mL/min (by C-G formula based on SCr of 0.97 mg/dL). Liver Function Tests: Recent Labs  Lab 01/05/20 1429 01/06/20 0415  AST 81* 60*  ALT 23 22  ALKPHOS 172* 121  BILITOT 1.0 1.0  PROT 6.9 6.0*  ALBUMIN 2.5* 2.1*   No results for input(s): LIPASE, AMYLASE in the last 168 hours. Recent Labs  Lab 01/05/20 1431  AMMONIA 34   Coagulation Profile: Recent Labs  Lab 01/05/20 1429  INR 1.2   Cardiac Enzymes: No results for input(s): CKTOTAL, CKMB, CKMBINDEX, TROPONINI in the last 168 hours. BNP (last 3 results) No results for input(s): PROBNP in the last 8760 hours. HbA1C: No results for input(s): HGBA1C in the last 72 hours. CBG: No results for input(s): GLUCAP  in the last 168 hours. Lipid Profile: No results for input(s): CHOL, HDL, LDLCALC, TRIG, CHOLHDL, LDLDIRECT in the last 72 hours. Thyroid Function Tests: No results for input(s): TSH, T4TOTAL, FREET4, T3FREE, THYROIDAB in the last 72 hours. Anemia Panel: No results for input(s): VITAMINB12, FOLATE, FERRITIN, TIBC, IRON, RETICCTPCT in the last 72 hours. Urine analysis:    Component Value Date/Time   COLORURINE ORANGE (A) 12/24/2019 1030   APPEARANCEUR TURBID (A) 12/24/2019 1030   LABSPEC >1.030 (H) 12/24/2019 1030   PHURINE 5.0 12/24/2019 1030   GLUCOSEU NEGATIVE 12/24/2019 1030   HGBUR  LARGE (A) 12/24/2019 1030   BILIRUBINUR MODERATE (A) 12/24/2019 1030   KETONESUR 15 (A) 12/24/2019 1030   PROTEINUR 100 (A) 12/24/2019 1030   UROBILINOGEN 1.0 04/09/2013 0151   NITRITE POSITIVE (A) 12/24/2019 1030   LEUKOCYTESUR TRACE (A) 12/24/2019 1030   Sepsis Labs: @LABRCNTIP (procalcitonin:4,lacticidven:4)  ) Recent Results (from the past 240 hour(s))  Blood Culture (routine x 2)     Status: None (Preliminary result)   Collection Time: 01/05/20  2:29 PM   Specimen: BLOOD LEFT ARM  Result Value Ref Range Status   Specimen Description   Final    BLOOD LEFT ARM Performed at [facility], [place] [address], [place], [place] [address]    Special Requests   Final    BOTTLES DRAWN AEROBIC AND ANAEROBIC Blood Culture adequate volume Performed at [facility] [address], [place], [place] [address]    Culture   Final    NO GROWTH 2 DAYS Performed at [facility] [address], [place], [place] [address]    Report Status PENDING  Incomplete  Blood Culture (routine x 2)     Status: None (Preliminary result)   Collection Time: 01/05/20  2:30 PM   Specimen: Left Antecubital; Blood  Result Value Ref Range Status   Specimen Description   Final    LEFT ANTECUBITAL Performed at [facility] [address] [place], [place] [address]    Special Requests   Final    BOTTLES DRAWN AEROBIC AND ANAEROBIC Blood Culture adequate volume Performed at [facility] [address] [place], [place] [address]    Culture   Final    NO GROWTH 2 DAYS Performed at [facility] [address], [place], [place] [address]    Report Status PENDING  Incomplete  SARS CORONAVIRUS 2 (TAT 6-24 HRS) Nasopharyngeal Nasopharyngeal Swab     Status: None   Collection Time: 01/05/20  6:27 PM   Specimen: Nasopharyngeal Swab  Result Value Ref Range Status   SARS Coronavirus 2 NEGATIVE NEGATIVE Final    Comment: (NOTE)  SARS-CoV-2 target nucleic acids are NOT DETECTED. The SARS-CoV-2 RNA is generally detectable in upper and lower respiratory specimens during the acute phase of infection. Negative results do not preclude SARS-CoV-2 infection, do not rule out co-infections with other pathogens, and should not be used as the sole basis for treatment or other patient management decisions. Negative results must be combined with clinical observations, patient history, and epidemiological information. The expected result is Negative. Fact Sheet for Patients: SugarRoll.be Fact Sheet for Healthcare Providers: https://www.woods-mathews.com/ This test is not yet approved or cleared by the [place] FDA and  has been authorized for detection and/or diagnosis of SARS-CoV-2 by FDA under an Emergency Use Authorization (EUA). This EUA will remain  in effect (meaning this test can be used) for the duration of the COVID-19  declaration under Section 56 4(b)(1) of the Act, 21 U.S.C. section 360bbb-3(b)(1), unless the authorization is terminated or revoked sooner. Performed at [facility], [place] [address] [place], [place] [address]       Studies: No results found.  Scheduled Meds: . amLODipine  10 mg Oral Daily  . clopidogrel  75 mg Oral Daily  . heparin  5,000 Units Subcutaneous Q8H  . isosorbide mononitrate  120 mg Oral Daily  . polyethylene glycol  17 g Oral Daily  . senna-docusate  1 tablet Oral QHS    Continuous Infusions: . sodium chloride Stopped (01/06/20 0542)  . sodium chloride Stopped (01/07/20 0351)  . cefTAZidime (FORTAZ)  IV 200 mL/hr at 01/07/20 0356  . vancomycin 1,750 mg (01/07/20 1047)     LOS: 2 days     Alma Friendly, MD [facility]  If 7PM-7AM, please contact night-coverage [email or web address] 01/07/2020, 2:14 PM

## 2020-01-07 NOTE — Progress Notes (Signed)
Pt c/o of L arm swelling. IV fluids stopped and arm elevated. Will continue to monitor pt. MD will assess in the AM.

## 2020-01-07 NOTE — Evaluation (Signed)
Physical Therapy Evaluation Patient Details Name: Connie Miranda MRN: [id number] DOB: 1933/04/16 Today's Date: 01/07/2020   History of Present Illness  86 yo female admitted with AMS, sepsis. Admitted 12/26-1/6 for similar. S/P IUD removal 01/02/20. Hx of bil LE edema, CHF, CVA-L hemiparesis, morbid obesity, anemia  Clinical Impression   Pt presents with significant LE weakness, extreme difficulty performing bed mobility requiring total +2 for rolling in bed, LE pain, and decreased activity tolerance. Pt to benefit from acute PT to address deficits. Per pt, at baseline she is able to move from supine to EOB and transfer into wheelchair. Pt is far from this mobility-wise, PT recommending SNF level of care post-acutely. PT to progress mobility as tolerated, and will continue to follow acutely.      Follow Up Recommendations SNF;Supervision/Assistance - 24 hour(depending on family decision and care available at home)    Equipment Recommendations  None recommended by PT    Recommendations for Other Services       Precautions / Restrictions Precautions Precautions: Fall Restrictions Weight Bearing Restrictions: No      Mobility  Bed Mobility Overal bed mobility: Needs Assistance Bed Mobility: Rolling Rolling: Total assist;+2 for physical assistance;+2 for safety/equipment         General bed mobility comments: total assist +2 for rolling bilaterally, pt with initiation of UE movement only. Placement of new pads, as pt incotinent of urine and stool.  Transfers                 General transfer comment: unable to assess  Ambulation/Gait                Stairs            Wheelchair Mobility    Modified Rankin (Stroke Patients Only)       Balance       Sitting balance - Comments: unable to assess                                     Pertinent Vitals/Pain Pain Assessment: Faces Faces Pain Scale: Hurts whole lot Pain Location: bil  LEs Pain Descriptors / Indicators: Sore Pain Intervention(s): Limited activity within patient's tolerance;Monitored during session;Repositioned    Home Living Family/patient expects to be discharged to:: Private residence Living Arrangements: Alone Available Help at Discharge: Personal care attendant(aides 3 hours/day. Also has a person named "Connie Miranda" that stays with her) Type of Home: House Home Access: Ramped entrance     Home Layout: One level Home Equipment: Wheelchair - manual;Cane - single point;Walker - 2 wheels;Hospital bed(lift equipment)      Prior Function Level of Independence: Needs assistance   Gait / Transfers Assistance Needed: pt reports at baseline she can pivot onto [facility] and wheelchair with assist from aide or Connie Miranda  ADL's / Homemaking Assistance Needed: Pt has assist for dressing, bathing        Hand Dominance   Dominant Hand: Right    Extremity/Trunk Assessment   Upper Extremity Assessment Upper Extremity Assessment: Generalized weakness(unable to elevate UEs >90* shoulder flexion)    Lower Extremity Assessment Lower Extremity Assessment: (able to perform partial AROM ankle pumps, heel slides, quad set R>L)    Cervical / Trunk Assessment Cervical / Trunk Assessment: (not assessed)  Communication   Communication: No difficulties  Cognition Arousal/Alertness: Awake/alert Behavior During Therapy: WFL for tasks assessed/performed Overall Cognitive Status: Within Functional Limits for tasks  assessed                                        General Comments      Exercises General Exercises - Lower Extremity Ankle Circles/Pumps: AROM;Both;10 reps;Supine   Assessment/Plan    PT Assessment Patient needs continued PT services  PT Problem List Decreased strength;Decreased mobility;Decreased balance;Decreased activity tolerance;Pain;Obesity       PT Treatment Interventions Functional mobility training;Therapeutic  activities;Therapeutic exercise;Patient/family education;DME instruction;Wheelchair mobility training    PT Goals (Current goals can be found in the Care Plan section)  Acute Rehab PT Goals Patient Stated Goal: none stated PT Goal Formulation: With patient Time For Goal Achievement: 01/21/20 Potential to Achieve Goals: Fair    Frequency Min 2X/week   Barriers to discharge        Co-evaluation               AM-PAC PT "6 Clicks" Mobility  Outcome Measure Help needed turning from your back to your side while in a flat bed without using bedrails?: Total Help needed moving from lying on your back to sitting on the side of a flat bed without using bedrails?: Total Help needed moving to and from a bed to a chair (including a wheelchair)?: Total Help needed standing up from a chair using your arms (e.g., wheelchair or bedside chair)?: Total Help needed to walk in hospital room?: Total Help needed climbing 3-5 steps with a railing? : Total 6 Click Score: 6    End of Session   Activity Tolerance: Patient limited by fatigue;Patient limited by pain Patient left: in bed;with call bell/phone within reach;with bed alarm set Nurse Communication: Mobility status PT Visit Diagnosis: Muscle weakness (generalized) (M62.81);Other abnormalities of gait and mobility (R26.89)    Time: 1610-9604 PT Time Calculation (min) (ACUTE ONLY): 31 min   Charges:   PT Evaluation $PT Eval Low Complexity: 1 Low PT Treatments $Therapeutic Activity: 8-22 mins       Akiah Bauch E, PT Acute Rehabilitation Services Pager [phone]  Office [phone]  Rasheida Broden D Delailah Spieth 01/07/2020, 1:51 PM

## 2020-01-08 LAB — FERRITIN: Ferritin: 112 ng/mL (ref 11–307)

## 2020-01-08 LAB — CBC WITH DIFFERENTIAL/PLATELET
Abs Immature Granulocytes: 0.02 10*3/uL (ref 0.00–0.07)
Basophils Absolute: 0 10*3/uL (ref 0.0–0.1)
Basophils Relative: 0 %
Eosinophils Absolute: 0.3 10*3/uL (ref 0.0–0.5)
Eosinophils Relative: 4 %
HCT: 24 % — ABNORMAL LOW (ref 36.0–46.0)
Hemoglobin: 7.2 g/dL — ABNORMAL LOW (ref 12.0–15.0)
Immature Granulocytes: 0 %
Lymphocytes Relative: 14 %
Lymphs Abs: 0.9 10*3/uL (ref 0.7–4.0)
MCH: 30.3 pg (ref 26.0–34.0)
MCHC: 30 g/dL (ref 30.0–36.0)
MCV: 100.8 fL — ABNORMAL HIGH (ref 80.0–100.0)
Monocytes Absolute: 0.3 10*3/uL (ref 0.1–1.0)
Monocytes Relative: 5 %
Neutro Abs: 5 10*3/uL (ref 1.7–7.7)
Neutrophils Relative %: 77 %
Platelets: 360 10*3/uL (ref 150–400)
RBC: 2.38 MIL/uL — ABNORMAL LOW (ref 3.87–5.11)
RDW: 20.1 % — ABNORMAL HIGH (ref 11.5–15.5)
WBC: 6.5 10*3/uL (ref 4.0–10.5)
nRBC: 0 % (ref 0.0–0.2)

## 2020-01-08 LAB — BASIC METABOLIC PANEL
Anion gap: 6 (ref 5–15)
BUN: 19 mg/dL (ref 8–23)
CO2: 22 mmol/L (ref 22–32)
Calcium: 8 mg/dL — ABNORMAL LOW (ref 8.9–10.3)
Chloride: 111 mmol/L (ref 98–111)
Creatinine, Ser: 0.83 mg/dL (ref 0.44–1.00)
GFR calc Af Amer: >60 mL/min (ref >60)
GFR calc non Af Amer: >60 mL/min (ref >60)
Glucose, Bld: 95 mg/dL (ref 70–99)
Potassium: 3.5 mmol/L (ref 3.5–5.1)
Sodium: 139 mmol/L (ref 135–145)

## 2020-01-08 LAB — TYPE AND SCREEN
ABO/RH(D): A POS
Antibody Screen: NEGATIVE

## 2020-01-08 LAB — IRON AND TIBC
Iron: 35 ug/dL (ref 28–170)
Saturation Ratios: 24 % (ref 10.4–31.8)
TIBC: 144 ug/dL — ABNORMAL LOW (ref 250–450)
UIBC: 109 ug/dL

## 2020-01-08 LAB — ABO/RH: ABO/RH(D): A POS

## 2020-01-08 LAB — VITAMIN B12: Vitamin B-12: 749 pg/mL (ref 180–914)

## 2020-01-08 LAB — PROCALCITONIN: Procalcitonin: 6.75 ng/mL

## 2020-01-08 LAB — FOLATE: Folate: 6 ng/mL (ref >5.9)

## 2020-01-08 MED ORDER — FUROSEMIDE 10 MG/ML IJ SOLN
40.0000 mg | Freq: Every day | INTRAMUSCULAR | Status: DC
  Administered 2020-01-08 – 2020-01-12 (×5): 40 mg via INTRAVENOUS
  Filled 2020-01-08 (×5): qty 4

## 2020-01-08 MED ORDER — SODIUM CHLORIDE 0.9 % IV SOLN
2.0000 g | Freq: Three times a day (TID) | INTRAVENOUS | Status: DC
  Administered 2020-01-08 (×2): 2 g via INTRAVENOUS
  Filled 2020-01-08 (×6): qty 2

## 2020-01-08 NOTE — Progress Notes (Signed)
PROGRESS NOTE  Connie Miranda [id number] DOB: August 03, 1933 DOA: 01/05/2020 PCP: [name], MD  HPI/Recap of past 24 hours: Connie Miranda is a 86 y.o. female with medical history significant for morbid obesity, prior CVA, hypertension, chronic anemia, G6PD deficiency, bilateral lower extremity lymphedema, history of pemphigus, recent vulvar abscess, dysphagia, debility/bedbound for the last 5 years, brought to the ED with altered mental status/encephalopathy with fever as high as 103.  Of note, patient was recently admitted on 12/24/2019 and discharged on 01/04/2020 for almost exactly the same similar issues (encephalopathy plus fever).  During the last admission, patient had a retained old IUD and possible endometritis, started on broad-spectrum antibiotics and completed 10 days.  GYN was consulted during that admission, had D&C removal of old IUD.  Patient was subsequently diagnosed with endometrial adenocarcinoma.  Patient overall got better, and eventually discharged.  Spoke to daughter who reported that patient was having some chills, otherwise no other complaints. PTA, daughter noticed that she was confused, felt warm as well as noted that she had vomited on herself.  Daughter called EMS and patient was transported to the ED for further management. In the ED, patient was noted to be febrile, tachycardic, saturating above 95 on room air.  Labs notable for elevated lactic acid at 2.8, WBC elevated at 24.9, Chest x-ray with no acute disease, CT abdomen showed marked volume of stool distending the rectum measuring about 18 x 8 cm, sigmoid diverticulosis without evidence of diverticulitis, CT head showed no evidence of acute intracranial abnormality.  Code sepsis was activated, patient was started on broad-spectrum IV antibiotics, IV fluids, and admitted for further management.     Today, patient denies any new complaints, mentation gradually improving back to  baseline.     Assessment/Plan: Principal Problem:   Sepsis ([place]) Active Problems:   Hypertension   Stroke ([place])   Glucose-6-phosphate dehydrogenase deficiency   Lymphedema   Morbid obesity ([place])   Endometrial carcinoma ([place])   Pressure injury of skin   Sepsis likely 2/2 ??BLE cellulitis Unknown etiology, suspect possible ??aspiration pneumonia vs endometritis vs UTI  Febrile, tachycardia, lactic acidosis on admission Currently afebrile, with resolved leukocytosis Saturating well on room air Lactic acid elevated, trended down UC pending  BC x2 NGTD POC and PCR Covid test negative Procalcitonin initially negative, trended up to 21.92--> 12.29-->6.75 Initial and repeat chest x-ray unremarkable CT abdomen/pelvis showed marked volume of stool distending the rectum measuring about 18 x 8 cm, sigmoid diverticulosis without evidence of diverticulitis Continue IV vancomycin, ceftazidime SLP evaluated patient, mild risk of aspiration, regular diet  Acute metabolic encephalopathy Improving Likely due to above CT head unremarkable Monitor closely  Constipation Had BMs since admission CT abdomen/pelvis showed marked volume of stool distending the rectum measuring about 18 x 8 cm Status post tapwater enema with good results Senna, MiraLAX  History of bullous pemphigoid/bilateral lymphedema/bilateral chronic venous stasis changes Possible ??source of sepsis Significant swelling noted, will give Lasix Wound care Not on any steroids or immunomodulators Monitor closely  Essential hypertension BP stable Restart amlodipine, Imdur IV hydralazine as needed  Chronic diastolic HF Difficult to assess fluid status due to body habitus and chronic lymphedema Monitor closely  Anemia of chronic disease Hemoglobin around baseline Noted to have some vaginal bleed Daily CBC  History of CVA Patient is bedbound over 5 years Continue home Plavix  Endometrioid adenocarcinoma,  Figo grade 2 CT abdomen/pelvis and transvaginal ultrasound revealed old retained IUD endometrial thickening to 19.1 mm Underwent  D&C with removal of old IUD and biopsy that revealed above GYN oncology consulted on last admission, recommended follow-up as an outpatient to discuss further management, not a surgical candidate, looking more towords progesterone IUD in the future  Debility/physical deconditioning Bedbound   Morbid obesity Lifestyle modification advised         Malnutrition Type:      Malnutrition Characteristics:      Nutrition Interventions:       Estimated body mass index is 44.12 kg/m as calculated from the following:   Height as of this encounter: 5\' 4"  (1.626 m).   Weight as of this encounter: 116.6 kg.     Code Status: Full  Family Communication: Discussed with daughter on 01/07/2020, confirmed DNR status  Disposition Plan: Bedbound, plan to d/c home. May not be rehab potential as she has been bedbound for over 5 years   Consultants:  None  Procedures:  None  Antimicrobials:  Vancomycin  Ceftazidime  DVT prophylaxis: Heparin   Objective: Vitals:   01/07/20 0547 01/07/20 1508 01/07/20 2020 01/08/20 0430  BP: 140/62 (!) 146/59 (!) 168/65 (!) 167/68  Pulse: 82 88 91 71  Resp: 16 20 18 18   Temp: 98.1 F (36.7 C) 98.3 F (36.8 C) 98.9 F (37.2 C) 98.2 F (36.8 C)  TempSrc: Oral Oral Oral Oral  SpO2: 100% 90% 100% 99%  Weight:      Height:        Intake/Output Summary (Last 24 hours) at 01/08/2020 1332 Last data filed at 01/08/2020 1030 Gross per 24 hour  Intake 1161.25 ml  Output 650 ml  Net 511.25 ml   Filed Weights   01/05/20 1407 01/06/20 0232  Weight: 110 kg 116.6 kg    Exam:  General: NAD, chronically ill-appearing, oriented  Cardiovascular: S1, S2 present  Respiratory:  Diminished breath sounds bilaterally  Abdomen: Soft, nontender, nondistended, obese, bowel sounds present  Musculoskeletal:  Bilateral significant chronic lymphedema noted, left hand swelling  Skin:  Chronic venous skin changes noted bilaterally  Psychiatry: Normal mood    Data Reviewed: CBC: Recent Labs  Lab 01/03/20 0520 01/04/20 0525 01/05/20 1429 01/06/20 0415 01/07/20 0458 01/08/20 0458  WBC 8.6 8.8 24.9* 19.1* 11.7* 6.5  NEUTROABS 7.4 6.2 23.8*  --  10.2* 5.0  HGB 8.3* 8.7* 9.5* 8.2* 8.0* 7.2*  HCT 27.3* 28.9* 31.6* 27.8* 27.3* 24.0*  MCV 96.8 99.3 98.8 102.2* 103.4* 100.8*  PLT 471* 452* 493* 426* 359 [id number]   Basic Metabolic Panel: Recent Labs  Lab 01/03/20 0520 01/05/20 1429 01/06/20 0415 01/07/20 0458 01/08/20 0458  NA 137 141 139 140 139  K 3.8 4.3 4.3 3.7 3.5  CL 109 110 111 114* 111  CO2 22 22 21* 21* 22  GLUCOSE 183* 128* 133* 91 95  BUN 8 12 15 20 19   CREATININE 0.81 1.15* 1.16* 0.97 0.83  CALCIUM 8.3* 8.4* 7.9* 7.8* 8.0*   GFR: Estimated Creatinine Clearance: 63.3 mL/min (by C-G formula based on SCr of 0.83 mg/dL). Liver Function Tests: Recent Labs  Lab 01/05/20 1429 01/06/20 0415  AST 81* 60*  ALT 23 22  ALKPHOS 172* 121  BILITOT 1.0 1.0  PROT 6.9 6.0*  ALBUMIN 2.5* 2.1*   No results for input(s): LIPASE, AMYLASE in the last 168 hours. Recent Labs  Lab 01/05/20 1431  AMMONIA 34   Coagulation Profile: Recent Labs  Lab 01/05/20 1429  INR 1.2   Cardiac Enzymes: No results for input(s): CKTOTAL, CKMB, CKMBINDEX, TROPONINI in the last  168 hours. BNP (last 3 results) No results for input(s): PROBNP in the last 8760 hours. HbA1C: No results for input(s): HGBA1C in the last 72 hours. CBG: No results for input(s): GLUCAP in the last 168 hours. Lipid Profile: No results for input(s): CHOL, HDL, LDLCALC, TRIG, CHOLHDL, LDLDIRECT in the last 72 hours. Thyroid Function Tests: No results for input(s): TSH, T4TOTAL, FREET4, T3FREE, THYROIDAB in the last 72 hours. Anemia Panel: Recent Labs    01/08/20 0754  VITAMINB12 749  FOLATE 6.0  FERRITIN 112  TIBC  144*  IRON 35   Urine analysis:    Component Value Date/Time   COLORURINE ORANGE (A) 12/24/2019 1030   APPEARANCEUR TURBID (A) 12/24/2019 1030   LABSPEC >1.030 (H) 12/24/2019 1030   PHURINE 5.0 12/24/2019 1030   GLUCOSEU NEGATIVE 12/24/2019 1030   HGBUR LARGE (A) 12/24/2019 1030   BILIRUBINUR MODERATE (A) 12/24/2019 1030   KETONESUR 15 (A) 12/24/2019 1030   PROTEINUR 100 (A) 12/24/2019 1030   UROBILINOGEN 1.0 04/09/2013 0151   NITRITE POSITIVE (A) 12/24/2019 1030   LEUKOCYTESUR TRACE (A) 12/24/2019 1030   Sepsis Labs: @LABRCNTIP (procalcitonin:4,lacticidven:4)  ) Recent Results (from the past 240 hour(s))  Blood Culture (routine x 2)     Status: None (Preliminary result)   Collection Time: 01/05/20  2:29 PM   Specimen: BLOOD LEFT ARM  Result Value Ref Range Status   Specimen Description   Final    BLOOD LEFT ARM Performed at [facility], [place] [address], [place], [place] [address]    Special Requests   Final    BOTTLES DRAWN AEROBIC AND ANAEROBIC Blood Culture adequate volume Performed at [place] [address] [place], [place] [address]    Culture   Final    NO GROWTH 3 DAYS Performed at [facility], [place] [address], [place], [place] [address]    Report Status PENDING  Incomplete  Blood Culture (routine x 2)     Status: None (Preliminary result)   Collection Time: 01/05/20  2:30 PM   Specimen: Left Antecubital; Blood  Result Value Ref Range Status   Specimen Description   Final    LEFT ANTECUBITAL Performed at [place] [address], [place], [place] [address]    Special Requests   Final    BOTTLES DRAWN AEROBIC AND ANAEROBIC Blood Culture adequate volume Performed at [place] [address], [place], [place] [address]    Culture   Final    NO GROWTH 3 DAYS Performed at [facility], [place] [address] [place], [place] [address]    Report Status PENDING   Incomplete  SARS CORONAVIRUS 2 (TAT 6-24 HRS) Nasopharyngeal Nasopharyngeal Swab     Status: None   Collection Time: 01/05/20  6:27 PM   Specimen: Nasopharyngeal Swab  Result Value Ref Range Status   SARS Coronavirus 2 NEGATIVE NEGATIVE Final    Comment: (NOTE) SARS-CoV-2 target nucleic acids are NOT DETECTED. The SARS-CoV-2 RNA is generally detectable in upper and lower respiratory specimens during the acute phase of infection. Negative results do not preclude SARS-CoV-2 infection, do not rule out co-infections with other pathogens, and should not be used as the sole basis for treatment or other patient management decisions. Negative results must be combined with clinical observations, patient history, and epidemiological information. The expected result is Negative. Fact Sheet for Patients: SugarRoll.be Fact Sheet for Healthcare Providers: https://www.woods-mathews.com/ This test is not yet approved or cleared by  the [employer] and  has been authorized for detection and/or diagnosis of SARS-CoV-2 by FDA under an Emergency Use Authorization (EUA). This EUA will remain  in effect (meaning this test can be used) for the duration of the COVID-19 declaration under Section 56 4(b)(1) of the Act, 21 U.S.C. section 360bbb-3(b)(1), unless the authorization is terminated or revoked sooner. Performed at [facility], [place] [address] [place], [place] [address]   Urine culture     Status: None (Preliminary result)   Collection Time: 01/07/20  5:42 AM   Specimen: In/Out Cath Urine  Result Value Ref Range Status   Specimen Description   Final    IN/OUT CATH URINE Performed at [place] [address], [place], [place] [address]    Special Requests   Final    NONE Performed at [facility], [place] [address] [place], [place] [address]    Culture   Final    CULTURE REINCUBATED FOR BETTER  GROWTH Performed at [facility], [place] [address] [place], [place] [address]    Report Status PENDING  Incomplete      Studies: No results found.  Scheduled Meds: . amLODipine  10 mg Oral Daily  . clopidogrel  75 mg Oral Daily  . heparin  5,000 Units Subcutaneous Q8H  . isosorbide mononitrate  120 mg Oral Daily  . polyethylene glycol  17 g Oral Daily  . senna-docusate  1 tablet Oral QHS    Continuous Infusions: . sodium chloride Stopped (01/06/20 0542)  . cefTAZidime (FORTAZ)  IV 2 g (01/08/20 0527)  . vancomycin 1,750 mg (01/07/20 1047)     LOS: 3 days     Alma Friendly, MD [facility]  If 7PM-7AM, please contact night-coverage [email or web address] 01/08/2020, 1:32 PM

## 2020-01-08 NOTE — Progress Notes (Signed)
Pharmacy Antibiotic Note  Connie Miranda is a 86 y.o. female admitted on 01/05/2020 with sepsis unknown source.  She was just discharged yesterday - during this admission she completed 10 day course of broad-spectrum IV antibiotics for cellulitis.  She also had removal of IUD, D&C and new dx of endometrial cancer during this admit.    Pharmacy was consulted for vancomycin and ceftazidime dosing.   Of note, she developed neurotoxicity while on Cefepime during her recent admission.  This was switched to Rocephin & improved.    01/08/2020: Now Afeb, WBC 24.9 > 6.5, SCr 1.15 > 0.83 Dex 5mg  on 1/4, WBC spike 1/7 to 24.9  Plan:  Increase Ceftazidime 2gm IV from q12h to q8hr  Continue Vancomycin 1750mg  IV q48h AUC goal 400 - 550  Monitor renal function and cx data   UCx 1/9 - reincubated, no growth BCx  Height: 5\' 4"  (162.6 cm) Weight: 257 lb 0.9 oz (116.6 kg) IBW/kg (Calculated) : 54.7  Temp (24hrs), Avg:98.5 F (36.9 C), Min:98.2 F (36.8 C), Max:98.9 F (37.2 C)  Recent Labs  Lab 01/03/20 0520 01/04/20 0525 01/05/20 1429 01/05/20 1830 01/05/20 2030 01/06/20 0415 01/07/20 0458 01/07/20 1436 01/08/20 0458  WBC 8.6 8.8 24.9*  --   --  19.1* 11.7*  --  6.5  CREATININE 0.81  --  1.15*  --   --  1.16* 0.97  --  0.83  LATICACIDVEN  --   --  2.8* 2.4* 2.6*  --   --  1.3  --     Estimated Creatinine Clearance: 63.3 mL/min (by C-G formula based on SCr of 0.83 mg/dL).    Antimicrobials this admission: 1/7 ceftazidime>>  1/7 vanco >>  1/7 metronidazole x1  Microbiology results: 1/9 UCx: ngtd, reincubated 1/7 BCx: ngtd 1/7 Covid: negative 1/7 Influenza PCR: neg/neg 12/27 MRSA PCR: negative  Allergies  Allergen Reactions  . Asa [Aspirin] Other (See Comments)    Doctor told her not to take due to type anemia  . Phosphate Other (See Comments)    Anything containing phosphate 6. Unknown reaction  . Codeine Other (See Comments)    Made her too sleepy  . Penicillins Itching  and Swelling    Tolerates keflex  Did it involve swelling of the face/tongue/throat, SOB, or low BP? unknown Did it involve sudden or severe rash/hives, skin peeling, or any reaction on the inside of your mouth or nose? unknown Did you need to seek medical attention at a hospital or doctor's office? unknown When did it last happen?years ago. If all above answers are "NO", may proceed with cephalosporin use.   . Sodium Phosphate    Thank you for allowing pharmacy to be a part of this patient's care.  Minda Ditto PharmD Pager [phone] 01/08/2020, 1:34 PM

## 2020-01-08 NOTE — Progress Notes (Signed)
Occupational Therapy Evaluation Patient Details Name: Connie Miranda MRN: [id number] DOB: 09/08/1933 Today's Date: 01/08/2020    History of Present Illness 86 yo female admitted with AMS, sepsis. Admitted 12/26-1/6 for similar. S/P IUD removal 01/02/20. Hx of bil LE edema, CHF, CVA-L hemiparesis, morbid obesity, anemia   Clinical Impression   PTA, pt was living at home with assistance from 1 person 24/7 and an additional pca to assist 3hours daily, pt reports she required assistance with ADL/IADL and 1 person assistance for transfers to w/c. Pt currently requires totalA+2 for rolling in bed, she demonstrates decreased strength and activity tolerance. Due to mobility limitations, pt requires maxA-totalA+2 for LB ADL and UB ADL at bed level. Due to decline in current level of function, pt would benefit from acute OT to address established goals to facilitate safe D/C to venue listed below. At this time, recommend SNF follow-up. Will continue to follow acutely.     Follow Up Recommendations  SNF    Equipment Recommendations  Hospital bed(hoyer lift)    Recommendations for Other Services       Precautions / Restrictions Precautions Precautions: Fall Restrictions Weight Bearing Restrictions: No      Mobility Bed Mobility Overal bed mobility: Needs Assistance Bed Mobility: Rolling Rolling: Total assist   Supine to sit: Total assist     General bed mobility comments: based on clinical reasoning, pt would require +2 totalA, pt initiated assistance of BUE but due to large body habitus and decreased strength was unable to properly assist  Transfers                 General transfer comment: unable to assess    Balance       Sitting balance - Comments: unable to assess                                   ADL either performed or assessed with clinical judgement   ADL Overall ADL's : Needs assistance/impaired Eating/Feeding: Set up;Sitting   Grooming:  Minimal assistance;Sitting Grooming Details (indicate cue type and reason): minA for thorough completion Upper Body Bathing: Maximal assistance;Bed level   Lower Body Bathing: Total assistance;+2 for physical assistance;+2 for safety/equipment   Upper Body Dressing : Maximal assistance   Lower Body Dressing: Total assistance;+2 for physical assistance;+2 for safety/equipment   Toilet Transfer: Total assistance;+2 for safety/equipment;+2 for physical assistance Toilet Transfer Details (indicate cue type and reason): rolling in bed Toileting- Clothing Manipulation and Hygiene: Total assistance       Functional mobility during ADLs: Maximal assistance;+2 for physical assistance;+2 for safety/equipment;Total assistance General ADL Comments: pt limited to bed level due to decreased strength and for pt/therapist safety;pt would require +2 to progress to EOB; pt reports she feels close to baseline. However, states that she can normally assist with progressing self to EOB and assist with transfers with 1 person;     Vision Patient Visual Report: No change from baseline Vision Assessment?: Vision impaired- to be further tested in functional context;Yes Eye Alignment: Impaired (comment)(L eye fixed laterally when R eye straight ahead) Additional Comments: difficult to assess vision due to decreased visual attention, continue to further assess     Perception     Praxis      Pertinent Vitals/Pain Pain Assessment: Faces Faces Pain Scale: Hurts whole lot Pain Location: bil LEs Pain Descriptors / Indicators: Sore Pain Intervention(s): Limited activity within patient's tolerance;Monitored during  session     Hand Dominance Right   Extremity/Trunk Assessment Upper Extremity Assessment Upper Extremity Assessment: Generalized weakness;RUE deficits/detail;LUE deficits/detail RUE Deficits / Details: shoulder flexion <100*;grossly 3/5 RUE Sensation: WNL RUE Coordination: WNL LUE Deficits /  Details: shoulder flexion <90*;grossly 3-/5 LUE Sensation: WNL LUE Coordination: decreased fine motor;decreased gross motor   Lower Extremity Assessment Lower Extremity Assessment: Defer to PT evaluation   Cervical / Trunk Assessment Cervical / Trunk Assessment: (not assessed)   Communication Communication Communication: No difficulties   Cognition Arousal/Alertness: Awake/alert Behavior During Therapy: WFL for tasks assessed/performed Overall Cognitive Status: Within Functional Limits for tasks assessed                                     General Comments  HR range 80-99bpm    Exercises Exercises: Other exercises Other Exercises Other Exercises: Shoulder flexion, bilateral AROM x10 Other Exercises: Shoulder adduction/abduction bilateral upper extremities x10 Other Exercises: elbow flexion bilaterally x10   Shoulder Instructions      Home Living Family/patient expects to be discharged to:: Private residence Living Arrangements: Alone Available Help at Discharge: Personal care attendant(aides 3 hours/day. Also has a person named "Joycelyn Schmid" that stays with her) Type of Home: House Home Access: Ramped entrance     Home Layout: One level               Home Equipment: Wheelchair - manual;Cane - single point;Walker - 2 wheels;Hospital bed(lift equipment)          Prior Functioning/Environment Level of Independence: Needs assistance  Gait / Transfers Assistance Needed: pt reports at baseline she can pivot onto [facility] and wheelchair with assist from aide or Joycelyn Schmid ADL's / Homemaking Assistance Needed: Pt has assist for dressing, bathing            OT Problem List: Decreased strength;Decreased range of motion;Decreased activity tolerance;Impaired balance (sitting and/or standing);Decreased knowledge of use of DME or AE;Obesity;Impaired UE functional use      OT Treatment/Interventions: Self-care/ADL training;Therapeutic exercise;Energy  conservation;DME and/or AE instruction;Therapeutic activities;Patient/family education;Balance training;Visual/perceptual remediation/compensation    OT Goals(Current goals can be found in the care plan section) Acute Rehab OT Goals Patient Stated Goal: to get stronger and return home  OT Goal Formulation: With patient Time For Goal Achievement: 01/22/20 Potential to Achieve Goals: Good ADL Goals Pt Will Perform Grooming: with modified independence;sitting Pt Will Transfer to Toilet: with mod assist;squat pivot transfer Additional ADL Goal #1: Pt will progress to EOB with minA in preparation for ADL.  OT Frequency: Min 2X/week   Barriers to D/C: Decreased caregiver support  pt has 1 person assist available 24/7       Co-evaluation              AM-PAC OT "6 Clicks" Daily Activity     Outcome Measure Help from another person eating meals?: A Little Help from another person taking care of personal grooming?: A Lot Help from another person toileting, which includes using toliet, bedpan, or urinal?: Total Help from another person bathing (including washing, rinsing, drying)?: A Lot Help from another person to put on and taking off regular upper body clothing?: A Lot Help from another person to put on and taking off regular lower body clothing?: Total 6 Click Score: 11   End of Session Nurse Communication: Mobility status  Activity Tolerance: Patient tolerated treatment well Patient left: in bed;with call bell/phone within reach;with bed  alarm set  OT Visit Diagnosis: Unsteadiness on feet (R26.81);Other abnormalities of gait and mobility (R26.89);Muscle weakness (generalized) (M62.81);Adult, failure to thrive (R62.7);Low vision, both eyes (H54.2)                Time: [id number] OT Time Calculation (min): 18 min Charges:  OT General Charges $OT Visit: 1 Visit OT Evaluation $OT Eval Moderate Complexity: [place] OTR/L Acute Rehabilitation Services Office:  [place] 01/08/2020, 1:00 PM

## 2020-01-09 LAB — CBC WITH DIFFERENTIAL/PLATELET
Abs Immature Granulocytes: 0.04 10*3/uL (ref 0.00–0.07)
Basophils Absolute: 0 10*3/uL (ref 0.0–0.1)
Basophils Relative: 0 %
Eosinophils Absolute: 0.2 10*3/uL (ref 0.0–0.5)
Eosinophils Relative: 4 %
HCT: 24.2 % — ABNORMAL LOW (ref 36.0–46.0)
Hemoglobin: 7.5 g/dL — ABNORMAL LOW (ref 12.0–15.0)
Immature Granulocytes: 1 %
Lymphocytes Relative: 21 %
Lymphs Abs: 1.1 10*3/uL (ref 0.7–4.0)
MCH: 30.6 pg (ref 26.0–34.0)
MCHC: 31 g/dL (ref 30.0–36.0)
MCV: 98.8 fL (ref 80.0–100.0)
Monocytes Absolute: 0.4 10*3/uL (ref 0.1–1.0)
Monocytes Relative: 8 %
Neutro Abs: 3.7 10*3/uL (ref 1.7–7.7)
Neutrophils Relative %: 66 %
Platelets: 353 10*3/uL (ref 150–400)
RBC: 2.45 MIL/uL — ABNORMAL LOW (ref 3.87–5.11)
RDW: 19.9 % — ABNORMAL HIGH (ref 11.5–15.5)
WBC: 5.5 10*3/uL (ref 4.0–10.5)
nRBC: 0 % (ref 0.0–0.2)

## 2020-01-09 LAB — BASIC METABOLIC PANEL
Anion gap: 11 (ref 5–15)
BUN: 14 mg/dL (ref 8–23)
CO2: 22 mmol/L (ref 22–32)
Calcium: 7.9 mg/dL — ABNORMAL LOW (ref 8.9–10.3)
Chloride: 109 mmol/L (ref 98–111)
Creatinine, Ser: 0.71 mg/dL (ref 0.44–1.00)
GFR calc Af Amer: >60 mL/min (ref >60)
GFR calc non Af Amer: >60 mL/min (ref >60)
Glucose, Bld: 93 mg/dL (ref 70–99)
Potassium: 4 mmol/L (ref 3.5–5.1)
Sodium: 142 mmol/L (ref 135–145)

## 2020-01-09 LAB — PROCALCITONIN: Procalcitonin: 4.3 ng/mL

## 2020-01-09 LAB — URINE CULTURE: Culture: 50000 — AB

## 2020-01-09 MED ORDER — LINEZOLID 600 MG/300ML IV SOLN
600.0000 mg | Freq: Two times a day (BID) | INTRAVENOUS | Status: DC
  Administered 2020-01-09 – 2020-01-10 (×3): 600 mg via INTRAVENOUS
  Filled 2020-01-09 (×4): qty 300

## 2020-01-09 NOTE — Progress Notes (Signed)
PROGRESS NOTE  Connie Miranda [id number] DOB: Jun 01, 1933 DOA: 01/05/2020 PCP: [name], MD  HPI/Recap of past 24 hours: Connie Miranda is a 86 y.o. female with medical history significant for morbid obesity, prior CVA, hypertension, chronic anemia, G6PD deficiency, bilateral lower extremity lymphedema, history of pemphigus, recent vulvar abscess, dysphagia, debility/bedbound for the last 5 years, brought to the ED with altered mental status/encephalopathy with fever as high as 103.  Of note, patient was recently admitted on 12/24/2019 and discharged on 01/04/2020 for almost exactly the same similar issues (encephalopathy plus fever).  During the last admission, patient had a retained old IUD and possible endometritis, started on broad-spectrum antibiotics and completed 10 days.  GYN was consulted during that admission, had D&C removal of old IUD.  Patient was subsequently diagnosed with endometrial adenocarcinoma.  Patient overall got better, and eventually discharged.  Spoke to daughter who reported that patient was having some chills, otherwise no other complaints. PTA, daughter noticed that she was confused, felt warm as well as noted that she had vomited on herself.  Daughter called EMS and patient was transported to the ED for further management. In the ED, patient was noted to be febrile, tachycardic, saturating above 95 on room air.  Labs notable for elevated lactic acid at 2.8, WBC elevated at 24.9, Chest x-ray with no acute disease, CT abdomen showed marked volume of stool distending the rectum measuring about 18 x 8 cm, sigmoid diverticulosis without evidence of diverticulitis, CT head showed no evidence of acute intracranial abnormality.  Code sepsis was activated, patient was started on broad-spectrum IV antibiotics, IV fluids, and admitted for further management.     Today, patient denies any new complaints, reported feeling okay.     Assessment/Plan: Principal Problem:  Sepsis ([place]) Active Problems:   Hypertension   Stroke ([place])   Glucose-6-phosphate dehydrogenase deficiency   Lymphedema   Morbid obesity ([place])   Endometrial carcinoma ([place])   Pressure injury of skin   Sepsis likely 2/2 ??VRE UTI Febrile, tachycardia, lactic acidosis on admission Currently afebrile, with resolved leukocytosis Saturating well on room air Lactic acid elevated, trended down UC grew 50,000 VRE, sensitive to linezolid BC x2 NGTD POC and PCR Covid test negative Procalcitonin initially negative, trended up to 21.92--> 12.29-->6.75-->4.30 Initial and repeat chest x-ray unremarkable CT abdomen/pelvis showed marked volume of stool distending the rectum measuring about 18 x 8 cm, sigmoid diverticulosis without evidence of diverticulitis D/C IV vancomycin, ceftazidime Start IV linezolid SLP evaluated patient, mild risk of aspiration, regular diet  Acute metabolic encephalopathy Improving Likely due to above CT head unremarkable Monitor closely  Constipation Had BMs since admission CT abdomen/pelvis showed marked volume of stool distending the rectum measuring about 18 x 8 cm Status post tapwater enema with good results Senna, MiraLAX  History of bullous pemphigoid/bilateral lymphedema/bilateral chronic venous stasis changes Possible ??source of sepsis Significant swelling noted, will give Lasix Wound care Not on any steroids or immunomodulators Monitor closely  Essential hypertension BP stable Restart amlodipine, Imdur IV hydralazine as needed  Chronic diastolic HF Difficult to assess fluid status due to body habitus and chronic lymphedema Monitor closely  Anemia of chronic disease Hemoglobin around baseline Noted to have some vaginal bleed Daily CBC  History of CVA Patient is bedbound over 5 years Continue home Plavix  Endometrioid adenocarcinoma, Figo grade 2 CT abdomen/pelvis and transvaginal ultrasound revealed old retained IUD  endometrial thickening to 19.1 mm Underwent D&C with removal of old IUD and biopsy that  revealed above GYN oncology consulted on last admission, recommended follow-up as an outpatient to discuss further management, not a surgical candidate, looking more towords progesterone IUD in the future  Debility/physical deconditioning PT/OT rec SNF   Morbid obesity Lifestyle modification advised         Malnutrition Type:      Malnutrition Characteristics:      Nutrition Interventions:       Estimated body mass index is 44.12 kg/m as calculated from the following:   Height as of this encounter: 5\' 4"  (1.626 m).   Weight as of this encounter: 116.6 kg.     Code Status: Full  Family Communication: Discussed with daughter on 01/07/2020  Disposition Plan: Plan for SNF  Consultants:  None  Procedures:  None  Antimicrobials: Linezolid   DVT prophylaxis: Heparin   Objective: Vitals:   01/08/20 2132 01/09/20 0443 01/09/20 0945 01/09/20 1414  BP: (!) 149/50 (!) 149/60 (!) 145/62 (!) 173/91  Pulse: 89 90  81  Resp: 20 20    Temp: 98.2 F (36.8 C) 98 F (36.7 C)  97.9 F (36.6 C)  TempSrc: Oral Oral  Oral  SpO2: 96% 100%  96%  Weight:      Height:        Intake/Output Summary (Last 24 hours) at 01/09/2020 1614 Last data filed at 01/09/2020 [id number] Gross per 24 hour  Intake 800 ml  Output 1800 ml  Net -1000 ml   Filed Weights   01/05/20 1407 01/06/20 0232  Weight: 110 kg 116.6 kg    Exam:  General: NAD, chronically ill-appearing, oriented  Cardiovascular: S1, S2 present  Respiratory:  Diminished breath sounds at the bases  Abdomen: Soft, nontender, nondistended, obese, bowel sounds present  Musculoskeletal: Bilateral significant chronic lymphedema noted  Skin:  Chronic venous skin changes noted bilaterally  Psychiatry: Normal mood    Data Reviewed: CBC: Recent Labs  Lab 01/04/20 0525 01/05/20 1429 01/06/20 0415 01/07/20 0458  01/08/20 0458 01/09/20 0454  WBC 8.8 24.9* 19.1* 11.7* 6.5 5.5  NEUTROABS 6.2 23.8*  --  10.2* 5.0 3.7  HGB 8.7* 9.5* 8.2* 8.0* 7.2* 7.5*  HCT 28.9* 31.6* 27.8* 27.3* 24.0* 24.2*  MCV 99.3 98.8 102.2* 103.4* 100.8* 98.8  PLT 452* 493* 426* 359 360 0000000   Basic Metabolic Panel: Recent Labs  Lab 01/05/20 1429 01/06/20 0415 01/07/20 0458 01/08/20 0458 01/09/20 0454  NA 141 139 140 139 142  K 4.3 4.3 3.7 3.5 4.0  CL 110 111 114* 111 109  CO2 22 21* 21* 22 22  GLUCOSE 128* 133* 91 95 93  BUN 12 15 20 19 14   CREATININE 1.15* 1.16* 0.97 0.83 0.71  CALCIUM 8.4* 7.9* 7.8* 8.0* 7.9*   GFR: Estimated Creatinine Clearance: 65.7 mL/min (by C-G formula based on SCr of 0.71 mg/dL). Liver Function Tests: Recent Labs  Lab 01/05/20 1429 01/06/20 0415  AST 81* 60*  ALT 23 22  ALKPHOS 172* 121  BILITOT 1.0 1.0  PROT 6.9 6.0*  ALBUMIN 2.5* 2.1*   No results for input(s): LIPASE, AMYLASE in the last 168 hours. Recent Labs  Lab 01/05/20 1431  AMMONIA 34   Coagulation Profile: Recent Labs  Lab 01/05/20 1429  INR 1.2   Cardiac Enzymes: No results for input(s): CKTOTAL, CKMB, CKMBINDEX, TROPONINI in the last 168 hours. BNP (last 3 results) No results for input(s): PROBNP in the last 8760 hours. HbA1C: No results for input(s): HGBA1C in the last 72 hours. CBG: No results for input(s):  GLUCAP in the last 168 hours. Lipid Profile: No results for input(s): CHOL, HDL, LDLCALC, TRIG, CHOLHDL, LDLDIRECT in the last 72 hours. Thyroid Function Tests: No results for input(s): TSH, T4TOTAL, FREET4, T3FREE, THYROIDAB in the last 72 hours. Anemia Panel: Recent Labs    01/08/20 0754  VITAMINB12 749  FOLATE 6.0  FERRITIN 112  TIBC 144*  IRON 35   Urine analysis:    Component Value Date/Time   COLORURINE ORANGE (A) 12/24/2019 1030   APPEARANCEUR TURBID (A) 12/24/2019 1030   LABSPEC >1.030 (H) 12/24/2019 1030   PHURINE 5.0 12/24/2019 1030   GLUCOSEU NEGATIVE 12/24/2019 1030    HGBUR LARGE (A) 12/24/2019 1030   BILIRUBINUR MODERATE (A) 12/24/2019 1030   KETONESUR 15 (A) 12/24/2019 1030   PROTEINUR 100 (A) 12/24/2019 1030   UROBILINOGEN 1.0 04/09/2013 0151   NITRITE POSITIVE (A) 12/24/2019 1030   LEUKOCYTESUR TRACE (A) 12/24/2019 1030   Sepsis Labs: @LABRCNTIP (procalcitonin:4,lacticidven:4)  ) Recent Results (from the past 240 hour(s))  Blood Culture (routine x 2)     Status: None (Preliminary result)   Collection Time: 01/05/20  2:29 PM   Specimen: BLOOD LEFT ARM  Result Value Ref Range Status   Specimen Description   Final    BLOOD LEFT ARM Performed at [facility] [address] [place], [place] [address]    Special Requests   Final    BOTTLES DRAWN AEROBIC AND ANAEROBIC Blood Culture adequate volume Performed at [facility] [address], [place], [place] [address]    Culture   Final    NO GROWTH 4 DAYS Performed at [facility], [place] [address], [place], [place] [address]    Report Status PENDING  Incomplete  Blood Culture (routine x 2)     Status: None (Preliminary result)   Collection Time: 01/05/20  2:30 PM   Specimen: Left Antecubital; Blood  Result Value Ref Range Status   Specimen Description   Final    LEFT ANTECUBITAL Performed at [facility] [address], [place], [place] [address]    Special Requests   Final    BOTTLES DRAWN AEROBIC AND ANAEROBIC Blood Culture adequate volume Performed at [facility] [address], [place], [place] [address]    Culture   Final    NO GROWTH 4 DAYS Performed at [facility], [place] [address], [place],  [address]    Report Status PENDING  Incomplete  SARS CORONAVIRUS 2 (TAT 6-24 HRS) Nasopharyngeal Nasopharyngeal Swab     Status: None   Collection Time: 01/05/20  6:27 PM   Specimen: Nasopharyngeal Swab  Result Value Ref Range Status   SARS Coronavirus 2 NEGATIVE NEGATIVE Final    Comment:  (NOTE) SARS-CoV-2 target nucleic acids are NOT DETECTED. The SARS-CoV-2 RNA is generally detectable in upper and lower respiratory specimens during the acute phase of infection. Negative results do not preclude SARS-CoV-2 infection, do not rule out co-infections with other pathogens, and should not be used as the sole basis for treatment or other patient management decisions. Negative results must be combined with clinical observations, patient history, and epidemiological information. The expected result is Negative. Fact Sheet for Patients: SugarRoll.be Fact Sheet for Healthcare Providers: https://www.woods-mathews.com/ This test is not yet approved or cleared by the [place] FDA and  has been authorized for detection and/or diagnosis of SARS-CoV-2 by FDA under an Emergency Use Authorization (EUA). This EUA will remain  in effect (meaning this  test can be used) for the duration of the COVID-19 declaration under Section 56 4(b)(1) of the Act, 21 U.S.C. section 360bbb-3(b)(1), unless the authorization is terminated or revoked sooner. Performed at [facility], [place] [address], [place], [place] [address]   Urine culture     Status: Abnormal   Collection Time: 01/07/20  5:42 AM   Specimen: In/Out Cath Urine  Result Value Ref Range Status   Specimen Description   Final    IN/OUT CATH URINE Performed at [facility] [address], [place], [place] [address]    Special Requests   Final    NONE Performed at [facility], [place] [address], [place], [place] [address]    Culture (A)  Final    50,000 COLONIES/mL VANCOMYCIN RESISTANT ENTEROCOCCUS   Report Status 01/09/2020 FINAL  Final   Organism ID, Bacteria VANCOMYCIN RESISTANT ENTEROCOCCUS (A)  Final      Susceptibility   Vancomycin resistant enterococcus - MIC*    AMPICILLIN >=32 RESISTANT Resistant     NITROFURANTOIN 128 RESISTANT Resistant       VANCOMYCIN >=32 RESISTANT Resistant     LINEZOLID 2 SENSITIVE Sensitive     * 50,000 COLONIES/mL VANCOMYCIN RESISTANT ENTEROCOCCUS      Studies: No results found.  Scheduled Meds: . amLODipine  10 mg Oral Daily  . clopidogrel  75 mg Oral Daily  . furosemide  40 mg Intravenous Daily  . heparin  5,000 Units Subcutaneous Q8H  . isosorbide mononitrate  120 mg Oral Daily  . polyethylene glycol  17 g Oral Daily  . senna-docusate  1 tablet Oral QHS    Continuous Infusions: . sodium chloride Stopped (01/06/20 0542)  . linezolid (ZYVOX) IV 600 mg (01/09/20 1238)     LOS: 4 days     Alma Friendly, MD [facility]  If 7PM-7AM, please contact night-coverage [email or web address] 01/09/2020, 4:14 PM

## 2020-01-09 NOTE — NC FL2 (Signed)
[place] MEDICAID FL2 LEVEL OF CARE SCREENING TOOL     IDENTIFICATION  Patient Name: Connie Miranda Birthdate: 1933-10-02 Sex: female Admission Date (Current Location): 01/05/2020  [facility] [place] Number:  Herbalist and Address:         Provider Number: [id number]  Attending Physician Name and Address:  Alma Friendly, MD  Relative Name and Phone Number:  Genelle Gather [phone]    Current Level of Care: Hospital Recommended Level of Care: [place] Prior Approval Number:    Date Approved/Denied:   PASRR Number: [id number]  Discharge Plan: SNF    Current Diagnoses: Patient Active Problem List   Diagnosis Date Noted  . Pressure injury of skin 01/06/2020  . Endometrial carcinoma ([place]) 01/03/2020  . Thickened endometrium   . Shock circulatory ([place]) 12/24/2019  . Chronic diastolic CHF (congestive heart failure) ([place]) 06/10/2018  . Sepsis ([place]) 06/10/2018  . Acute respiratory failure with hypoxia ([place]) 06/10/2018  . Hypokalemia 06/10/2018  . Acute bronchitis 06/10/2018  . Lower extremity weakness 02/11/2016  . Difficulty walking 04/08/2013  . Weakness of both legs 04/08/2013  . Morbid obesity ([place]) 04/08/2013  . AKI (acute kidney injury) ([place]) 01/28/2013  . Hyperkalemia 01/28/2013  . Fall 01/28/2013  . Nausea 01/28/2013  . Lymphedema 10/16/2012  . Cellulitis 10/16/2012  . Diarrhea 10/13/2012  . Cellulitis of left lower leg 10/12/2012  . Anemia 10/12/2012  . Abdominal pain 10/12/2012  . Hip pain 10/12/2012  . Hypertension   . Edema   . Pernicious anemia   . Glucose-6-phosphate dehydrogenase deficiency   . Osteoarthritis   . Stroke ([place]) 09/28/2000    Orientation RESPIRATION BLADDER Height & Weight     Self, Time, Situation  Normal Incontinent Weight: 116.6 kg Height:  5\' 4"  (162.6 cm)  BEHAVIORAL SYMPTOMS/MOOD NEUROLOGICAL BOWEL NUTRITION STATUS      Incontinent Diet(Heart Healthy)  AMBULATORY STATUS  COMMUNICATION OF NEEDS Skin   Limited Assist Verbally Other (Comment)(Bilateral leg cellulitis)                       Personal Care Assistance Level of Assistance  Bathing, Dressing Bathing Assistance: Limited assistance   Dressing Assistance: Limited assistance     Functional Limitations Info  Sight, Hearing, Speech Sight Info: Adequate Hearing Info: Adequate Speech Info: Adequate    SPECIAL CARE FACTORS FREQUENCY  PT (By licensed PT), OT (By licensed OT)     PT Frequency: 5x week OT Frequency: 5x week            Contractures Contractures Info: Not present    Additional Factors Info  Code Status, Allergies Code Status Info: DNR Allergies Info: [place] Aspirin, Phosphate, Codeine, Penicillins, Sodium Phosphate           Current Medications (01/09/2020):  This is the current hospital active medication list Current Facility-Administered Medications  Medication Dose Route Frequency Provider Last Rate Last Admin  . 0.9 %  sodium chloride infusion   Intravenous PRN Alma Friendly, MD   Stopped at 01/06/20 0542  . acetaminophen (TYLENOL) tablet 650 mg  650 mg Oral Q6H PRN Alma Friendly, MD       Or  . acetaminophen (TYLENOL) suppository 650 mg  650 mg Rectal Q6H PRN Alma Friendly, MD      . albuterol (PROVENTIL) (2.5 MG/3ML) 0.083% nebulizer solution 2.5 mg  2.5 mg Nebulization Q2H PRN Alma Friendly, MD      .  amLODipine (NORVASC) tablet 10 mg  10 mg Oral Daily Alma Friendly, MD   10 mg at 01/09/20 0945  . clopidogrel (PLAVIX) tablet 75 mg  75 mg Oral Daily Alma Friendly, MD   75 mg at 01/09/20 0945  . furosemide (LASIX) injection 40 mg  40 mg Intravenous Daily Alma Friendly, MD   40 mg at 01/09/20 0945  . heparin injection 5,000 Units  5,000 Units Subcutaneous Q8H Alma Friendly, MD   5,000 Units at 01/09/20 1412  . hydrALAZINE (APRESOLINE) injection 10 mg  10 mg Intravenous Q8H PRN Alma Friendly, MD      .  isosorbide mononitrate (IMDUR) 24 hr tablet 120 mg  120 mg Oral Daily Alma Friendly, MD   120 mg at 01/09/20 0945  . linezolid (ZYVOX) IVPB 600 mg  600 mg Intravenous Q12H Alma Friendly, MD 300 mL/hr at 01/09/20 1238 600 mg at 01/09/20 1238  . ondansetron (ZOFRAN) tablet 4 mg  4 mg Oral Q6H PRN Alma Friendly, MD       Or  . ondansetron [facility]) injection 4 mg  4 mg Intravenous Q6H PRN Alma Friendly, MD      . polyethylene glycol (MIRALAX / GLYCOLAX) packet 17 g  17 g Oral Daily Alma Friendly, MD   17 g at 01/09/20 0945  . senna-docusate (Senokot-S) tablet 1 tablet  1 tablet Oral QHS Alma Friendly, MD   1 tablet at 01/08/20 2141     Discharge Medications: Please see discharge summary for a list of discharge medications.  Relevant Imaging Results:  Relevant Lab Results:   Additional Information [phone]  Dessa Phi, RN

## 2020-01-10 LAB — CBC WITH DIFFERENTIAL/PLATELET
Abs Immature Granulocytes: 0.06 10*3/uL (ref 0.00–0.07)
Basophils Absolute: 0 10*3/uL (ref 0.0–0.1)
Basophils Relative: 1 %
Eosinophils Absolute: 0.2 10*3/uL (ref 0.0–0.5)
Eosinophils Relative: 4 %
HCT: 25 % — ABNORMAL LOW (ref 36.0–46.0)
Hemoglobin: 7.7 g/dL — ABNORMAL LOW (ref 12.0–15.0)
Immature Granulocytes: 1 %
Lymphocytes Relative: 24 %
Lymphs Abs: 1.2 10*3/uL (ref 0.7–4.0)
MCH: 30.1 pg (ref 26.0–34.0)
MCHC: 30.8 g/dL (ref 30.0–36.0)
MCV: 97.7 fL (ref 80.0–100.0)
Monocytes Absolute: 0.5 10*3/uL (ref 0.1–1.0)
Monocytes Relative: 9 %
Neutro Abs: 3.1 10*3/uL (ref 1.7–7.7)
Neutrophils Relative %: 61 %
Platelets: 333 10*3/uL (ref 150–400)
RBC: 2.56 MIL/uL — ABNORMAL LOW (ref 3.87–5.11)
RDW: 19.3 % — ABNORMAL HIGH (ref 11.5–15.5)
WBC: 5.1 10*3/uL (ref 4.0–10.5)
nRBC: 0 % (ref 0.0–0.2)

## 2020-01-10 LAB — BASIC METABOLIC PANEL
Anion gap: 7 (ref 5–15)
BUN: 9 mg/dL (ref 8–23)
CO2: 26 mmol/L (ref 22–32)
Calcium: 8.6 mg/dL — ABNORMAL LOW (ref 8.9–10.3)
Chloride: 108 mmol/L (ref 98–111)
Creatinine, Ser: 0.66 mg/dL (ref 0.44–1.00)
GFR calc Af Amer: >60 mL/min (ref >60)
GFR calc non Af Amer: >60 mL/min (ref >60)
Glucose, Bld: 101 mg/dL — ABNORMAL HIGH (ref 70–99)
Potassium: 3.2 mmol/L — ABNORMAL LOW (ref 3.5–5.1)
Sodium: 141 mmol/L (ref 135–145)

## 2020-01-10 LAB — CULTURE, BLOOD (ROUTINE X 2)
Culture: NO GROWTH
Culture: NO GROWTH
Special Requests: ADEQUATE
Special Requests: ADEQUATE

## 2020-01-10 LAB — PROCALCITONIN: Procalcitonin: 2.1 ng/mL

## 2020-01-10 MED ORDER — POTASSIUM CHLORIDE CRYS ER 20 MEQ PO TBCR
40.0000 meq | EXTENDED_RELEASE_TABLET | Freq: Once | ORAL | Status: AC
  Administered 2020-01-10: 40 meq via ORAL
  Filled 2020-01-10: qty 2

## 2020-01-10 MED ORDER — LINEZOLID 600 MG PO TABS
600.0000 mg | ORAL_TABLET | Freq: Two times a day (BID) | ORAL | Status: DC
  Administered 2020-01-11 – 2020-01-12 (×4): 600 mg via ORAL
  Filled 2020-01-10 (×5): qty 1

## 2020-01-10 NOTE — Progress Notes (Signed)
PROGRESS NOTE  Connie Miranda [id number] DOB: December 14, 1933 DOA: 01/05/2020 PCP: [name], MD  HPI/Recap of past 24 hours: Connie Miranda is a 86 y.o. female with medical history significant for morbid obesity, prior CVA, hypertension, chronic anemia, G6PD deficiency, bilateral lower extremity lymphedema, history of pemphigus, recent vulvar abscess, dysphagia, debility/bedbound for the last 5 years, brought to the ED with altered mental status/encephalopathy with fever as high as 103.  Of note, patient was recently admitted on 12/24/2019 and discharged on 01/04/2020 for almost exactly the same similar issues (encephalopathy plus fever).  During the last admission, patient had a retained old IUD and possible endometritis, started on broad-spectrum antibiotics and completed 10 days.  GYN was consulted during that admission, had D&C removal of old IUD.  Patient was subsequently diagnosed with endometrial adenocarcinoma.  Patient overall got better, and eventually discharged.  Spoke to daughter who reported that patient was having some chills, otherwise no other complaints. PTA, daughter noticed that she was confused, felt warm as well as noted that she had vomited on herself.  Daughter called EMS and patient was transported to the ED for further management. In the ED, patient was noted to be febrile, tachycardic, saturating above 95 on room air.  Labs notable for elevated lactic acid at 2.8, WBC elevated at 24.9, Chest x-ray with no acute disease, CT abdomen showed marked volume of stool distending the rectum measuring about 18 x 8 cm, sigmoid diverticulosis without evidence of diverticulitis, CT head showed no evidence of acute intracranial abnormality.  Code sepsis was activated, patient was started on broad-spectrum IV antibiotics, IV fluids, and admitted for further management.     Today, patient denies any new complaints.     Assessment/Plan: Principal Problem:   Sepsis ([place]) Active  Problems:   Hypertension   Stroke ([place])   Glucose-6-phosphate dehydrogenase deficiency   Lymphedema   Morbid obesity ([place])   Endometrial carcinoma ([place])   Pressure injury of skin   Sepsis likely 2/2 ??VRE UTI Febrile, tachycardia, lactic acidosis on admission Currently afebrile, with resolved leukocytosis Saturating well on room air Lactic acid elevated, trended down UC grew 50,000 VRE, sensitive to linezolid BC x2 NGTD POC and PCR Covid test negative Procalcitonin initially negative, trended up to 21.92--> 12.29-->6.75-->4.30 Initial and repeat chest x-ray unremarkable CT abdomen/pelvis showed marked volume of stool distending the rectum measuring about 18 x 8 cm, sigmoid diverticulosis without evidence of diverticulitis D/C IV vancomycin, ceftazidime Continue PO linezolid for a total of 7 days, discussed with Dr Johnnye Sima on 01/09/20 SLP evaluated patient, mild risk of aspiration, regular diet  Acute metabolic encephalopathy Improving Likely due to above CT head unremarkable Monitor closely  Constipation Had BMs since admission CT abdomen/pelvis showed marked volume of stool distending the rectum measuring about 18 x 8 cm Status post tapwater enema with good results Senna, MiraLAX  History of bullous pemphigoid/bilateral lymphedema/bilateral chronic venous stasis changes Possible ??source of sepsis Significant swelling noted, will give Lasix Wound care Not on any steroids or immunomodulators Monitor closely  Essential hypertension BP stable Restart amlodipine, Imdur IV hydralazine as needed  Chronic diastolic HF Difficult to assess fluid status due to body habitus and chronic lymphedema Monitor closely  Anemia of chronic disease Hemoglobin around baseline Noted to have some vaginal bleed Daily CBC  History of CVA Patient is bedbound over 5 years Continue home Plavix  Endometrioid adenocarcinoma, Figo grade 2 CT abdomen/pelvis and transvaginal  ultrasound revealed old retained IUD endometrial thickening to 19.1  mm Underwent D&C with removal of old IUD and biopsy that revealed above GYN oncology consulted on last admission, recommended follow-up as an outpatient to discuss further management, not a surgical candidate, looking more towords progesterone IUD in the future  Debility/physical deconditioning PT/OT rec SNF   Morbid obesity Lifestyle modification advised         Malnutrition Type:      Malnutrition Characteristics:      Nutrition Interventions:       Estimated body mass index is 44.12 kg/m as calculated from the following:   Height as of this encounter: 5\' 4"  (1.626 m).   Weight as of this encounter: 116.6 kg.     Code Status: Full  Family Communication: Discussed with daughter on 01/07/2020  Disposition Plan: Plan for SNF, medically stable for d/c  Consultants:  None  Procedures:  None  Antimicrobials: Linezolid   DVT prophylaxis: Heparin   Objective: Vitals:   01/09/20 2313 01/10/20 0538 01/10/20 1256 01/10/20 2053  BP: (!) 151/77 (!) 148/54 (!) 169/70 (!) 143/85  Pulse: 94 70 84 84  Resp: 16 16 15 18   Temp: 98.9 F (37.2 C) 98.5 F (36.9 C) 98.6 F (37 C) 98.4 F (36.9 C)  TempSrc: Oral Oral Oral Oral  SpO2: 95% 94% 100% 96%  Weight:      Height:        Intake/Output Summary (Last 24 hours) at 01/10/2020 2142 Last data filed at 01/10/2020 1844 Gross per 24 hour  Intake 1230.01 ml  Output 2210 ml  Net -979.99 ml   Filed Weights   01/05/20 1407 01/06/20 0232  Weight: 110 kg 116.6 kg    Exam:  General: NAD, chronically ill-appearing, obese   Cardiovascular: S1, S2 present  Respiratory: Diminished BS at the bases  Abdomen: Soft, nontender, nondistended, obese, bowel sounds present  Musculoskeletal: Significant bilateral chronic lymphedema  Skin: Chronic venous skin changes bilaterally  Psychiatry: Normal mood    Data Reviewed: CBC: Recent Labs    Lab 01/05/20 1429 01/06/20 0415 01/07/20 0458 01/08/20 0458 01/09/20 0454 01/10/20 0420  WBC 24.9* 19.1* 11.7* 6.5 5.5 5.1  NEUTROABS 23.8*  --  10.2* 5.0 3.7 3.1  HGB 9.5* 8.2* 8.0* 7.2* 7.5* 7.7*  HCT 31.6* 27.8* 27.3* 24.0* 24.2* 25.0*  MCV 98.8 102.2* 103.4* 100.8* 98.8 97.7  PLT 493* 426* 359 360 353 0000000   Basic Metabolic Panel: Recent Labs  Lab 01/06/20 0415 01/07/20 0458 01/08/20 0458 01/09/20 0454 01/10/20 0420  NA 139 140 139 142 141  K 4.3 3.7 3.5 4.0 3.2*  CL 111 114* 111 109 108  CO2 21* 21* 22 22 26   GLUCOSE 133* 91 95 93 101*  BUN 15 20 19 14 9   CREATININE 1.16* 0.97 0.83 0.71 0.66  CALCIUM 7.9* 7.8* 8.0* 7.9* 8.6*   GFR: Estimated Creatinine Clearance: 65.7 mL/min (by C-G formula based on SCr of 0.66 mg/dL). Liver Function Tests: Recent Labs  Lab 01/05/20 1429 01/06/20 0415  AST 81* 60*  ALT 23 22  ALKPHOS 172* 121  BILITOT 1.0 1.0  PROT 6.9 6.0*  ALBUMIN 2.5* 2.1*   No results for input(s): LIPASE, AMYLASE in the last 168 hours. Recent Labs  Lab 01/05/20 1431  AMMONIA 34   Coagulation Profile: Recent Labs  Lab 01/05/20 1429  INR 1.2   Cardiac Enzymes: No results for input(s): CKTOTAL, CKMB, CKMBINDEX, TROPONINI in the last 168 hours. BNP (last 3 results) No results for input(s): PROBNP in the last 8760 hours. HbA1C:  No results for input(s): HGBA1C in the last 72 hours. CBG: No results for input(s): GLUCAP in the last 168 hours. Lipid Profile: No results for input(s): CHOL, HDL, LDLCALC, TRIG, CHOLHDL, LDLDIRECT in the last 72 hours. Thyroid Function Tests: No results for input(s): TSH, T4TOTAL, FREET4, T3FREE, THYROIDAB in the last 72 hours. Anemia Panel: Recent Labs    01/08/20 0754  VITAMINB12 749  FOLATE 6.0  FERRITIN 112  TIBC 144*  IRON 35   Urine analysis:    Component Value Date/Time   COLORURINE ORANGE (A) 12/24/2019 1030   APPEARANCEUR TURBID (A) 12/24/2019 1030   LABSPEC >1.030 (H) 12/24/2019 1030    PHURINE 5.0 12/24/2019 1030   GLUCOSEU NEGATIVE 12/24/2019 1030   HGBUR LARGE (A) 12/24/2019 1030   BILIRUBINUR MODERATE (A) 12/24/2019 1030   KETONESUR 15 (A) 12/24/2019 1030   PROTEINUR 100 (A) 12/24/2019 1030   UROBILINOGEN 1.0 04/09/2013 0151   NITRITE POSITIVE (A) 12/24/2019 1030   LEUKOCYTESUR TRACE (A) 12/24/2019 1030   Sepsis Labs: @LABRCNTIP (procalcitonin:4,lacticidven:4)  ) Recent Results (from the past 240 hour(s))  Blood Culture (routine x 2)     Status: None   Collection Time: 01/05/20  2:29 PM   Specimen: BLOOD LEFT ARM  Result Value Ref Range Status   Specimen Description   Final    BLOOD LEFT ARM Performed at [facility], [place] [address], [place], [place] [address]    Special Requests   Final    BOTTLES DRAWN AEROBIC AND ANAEROBIC Blood Culture adequate volume Performed at [facility] [address] [place], [place] [address]    Culture   Final    NO GROWTH 5 DAYS Performed at [facility], [place] [address], [place], [place] [address]    Report Status 01/10/2020 FINAL  Final  Blood Culture (routine x 2)     Status: None   Collection Time: 01/05/20  2:30 PM   Specimen: Left Antecubital; Blood  Result Value Ref Range Status   Specimen Description   Final    LEFT ANTECUBITAL Performed at [facility] [address], [place], [place] [address]    Special Requests   Final    BOTTLES DRAWN AEROBIC AND ANAEROBIC Blood Culture adequate volume Performed at [facility] [address] [place], [place] [address]    Culture   Final    NO GROWTH 5 DAYS Performed at [facility], [place] [address] [place], [place] [address]    Report Status 01/10/2020 FINAL  Final  SARS CORONAVIRUS 2 (TAT 6-24 HRS) Nasopharyngeal Nasopharyngeal Swab     Status: None   Collection Time: 01/05/20  6:27 PM   Specimen: Nasopharyngeal Swab  Result Value Ref Range Status   SARS Coronavirus 2  NEGATIVE NEGATIVE Final    Comment: (NOTE) SARS-CoV-2 target nucleic acids are NOT DETECTED. The SARS-CoV-2 RNA is generally detectable in upper and lower respiratory specimens during the acute phase of infection. Negative results do not preclude SARS-CoV-2 infection, do not rule out co-infections with other pathogens, and should not be used as the sole basis for treatment or other patient management decisions. Negative results must be combined with clinical observations, patient history, and epidemiological information. The expected result is Negative. Fact Sheet for Patients: SugarRoll.be Fact Sheet for Healthcare Providers: https://www.woods-mathews.com/ This test is not yet approved or cleared by the [place] FDA and  has been authorized for detection and/or diagnosis of SARS-CoV-2 by FDA under an  Emergency Use Authorization (EUA). This EUA will remain  in effect (meaning this test can be used) for the duration of the COVID-19 declaration under Section 56 4(b)(1) of the Act, 21 U.S.C. section 360bbb-3(b)(1), unless the authorization is terminated or revoked sooner. Performed at [facility], [place] [address] [place], [place] [address]   Urine culture     Status: Abnormal   Collection Time: 01/07/20  5:42 AM   Specimen: In/Out Cath Urine  Result Value Ref Range Status   Specimen Description   Final    IN/OUT CATH URINE Performed at [facility] [address], [place], [place] [address]    Special Requests   Final    NONE Performed at [facility], [place] [address], [place], [place] [address]    Culture (A)  Final    50,000 COLONIES/mL VANCOMYCIN RESISTANT ENTEROCOCCUS   Report Status 01/09/2020 FINAL  Final   Organism ID, Bacteria VANCOMYCIN RESISTANT ENTEROCOCCUS (A)  Final      Susceptibility   Vancomycin resistant enterococcus - MIC*    AMPICILLIN >=32 RESISTANT Resistant      NITROFURANTOIN 128 RESISTANT Resistant     VANCOMYCIN >=32 RESISTANT Resistant     LINEZOLID 2 SENSITIVE Sensitive     * 50,000 COLONIES/mL VANCOMYCIN RESISTANT ENTEROCOCCUS      Studies: No results found.  Scheduled Meds: . amLODipine  10 mg Oral Daily  . clopidogrel  75 mg Oral Daily  . furosemide  40 mg Intravenous Daily  . heparin  5,000 Units Subcutaneous Q8H  . isosorbide mononitrate  120 mg Oral Daily  . linezolid  600 mg Oral Q12H  . polyethylene glycol  17 g Oral Daily  . senna-docusate  1 tablet Oral QHS    Continuous Infusions: . sodium chloride Stopped (01/10/20 1800)     LOS: 5 days     Alma Friendly, MD [facility]  If 7PM-7AM, please contact night-coverage [email or web address] 01/10/2020, 9:42 PM

## 2020-01-10 NOTE — TOC Progression Note (Signed)
Transition of Care [facility]) - Progression Note    Patient Details  Name: Connie Miranda MRN: [id number] Date of Birth: 11/28/1933  Transition of Care [facility]) CM/SW Contact  Dessiree Sze, Juliann Pulse, RN Phone Number: 01/10/2020, 2:28 PM  Clinical Narrative: Dtr accepted bed @ [facility]. continue to follow for d/c.      Expected Discharge Plan: Skilled Nursing Facility Barriers to Discharge: Continued Medical Work up  Expected Discharge Plan and Services Expected Discharge Plan: [place]   Discharge Planning Services: CM Consult   Living arrangements for the past 2 months: Single Family Home                                       Social Determinants of Health (SDOH) Interventions    Readmission Risk Interventions Readmission Risk Prevention Plan 01/06/2020  Transportation Screening Complete  PCP or Specialist Appt within 3-5 Days Complete  HRI or [place] Complete  Social Work Consult for [place] Planning/Counseling Complete  Palliative Care Screening Not Complete  Medication Review [employer] photographer) Complete  Some recent data might be hidden

## 2020-01-10 NOTE — TOC Progression Note (Signed)
Transition of Care [facility]) - Progression Note    Patient Details  Name: Connie Miranda MRN: [id number] Date of Birth: [birth year]  Transition of Care [facility]) CM/SW Contact  Herve Haug, Juliann Pulse, RN Phone Number: 01/10/2020, 2:22 PM  Clinical Narrative:  dtr accepted bed [place].    Expected Discharge Plan: Skilled Nursing Facility Barriers to Discharge: Continued Medical Work up  Expected Discharge Plan and Services Expected Discharge Plan: [place]   Discharge Planning Services: CM Consult   Living arrangements for the past 2 months: Single Family Home                                       Social Determinants of Health (SDOH) Interventions    Readmission Risk Interventions Readmission Risk Prevention Plan 01/06/2020  Transportation Screening Complete  PCP or Specialist Appt within 3-5 Days Complete  HRI or [place] Complete  Social Work Consult for [place] Planning/Counseling Complete  Palliative Care Screening Not Complete  Medication Review Press photographer) Complete  Some recent data might be hidden

## 2020-01-11 LAB — BASIC METABOLIC PANEL
Anion gap: 7 (ref 5–15)
BUN: 8 mg/dL (ref 8–23)
CO2: 26 mmol/L (ref 22–32)
Calcium: 9 mg/dL (ref 8.9–10.3)
Chloride: 107 mmol/L (ref 98–111)
Creatinine, Ser: 0.62 mg/dL (ref 0.44–1.00)
GFR calc Af Amer: >60 mL/min (ref >60)
GFR calc non Af Amer: >60 mL/min (ref >60)
Glucose, Bld: 95 mg/dL (ref 70–99)
Potassium: 3.9 mmol/L (ref 3.5–5.1)
Sodium: 140 mmol/L (ref 135–145)

## 2020-01-11 LAB — CBC WITH DIFFERENTIAL/PLATELET
Abs Immature Granulocytes: 0.05 10*3/uL (ref 0.00–0.07)
Basophils Absolute: 0.1 10*3/uL (ref 0.0–0.1)
Basophils Relative: 1 %
Eosinophils Absolute: 0.2 10*3/uL (ref 0.0–0.5)
Eosinophils Relative: 4 %
HCT: 28.7 % — ABNORMAL LOW (ref 36.0–46.0)
Hemoglobin: 8.7 g/dL — ABNORMAL LOW (ref 12.0–15.0)
Immature Granulocytes: 1 %
Lymphocytes Relative: 23 %
Lymphs Abs: 1.3 10*3/uL (ref 0.7–4.0)
MCH: 30.2 pg (ref 26.0–34.0)
MCHC: 30.3 g/dL (ref 30.0–36.0)
MCV: 99.7 fL (ref 80.0–100.0)
Monocytes Absolute: 0.6 10*3/uL (ref 0.1–1.0)
Monocytes Relative: 10 %
Neutro Abs: 3.5 10*3/uL (ref 1.7–7.7)
Neutrophils Relative %: 61 %
Platelets: 377 10*3/uL (ref 150–400)
RBC: 2.88 MIL/uL — ABNORMAL LOW (ref 3.87–5.11)
RDW: 19.4 % — ABNORMAL HIGH (ref 11.5–15.5)
WBC: 5.6 10*3/uL (ref 4.0–10.5)
nRBC: 0 % (ref 0.0–0.2)

## 2020-01-11 LAB — SARS CORONAVIRUS 2 (TAT 6-24 HRS): SARS Coronavirus 2: NEGATIVE

## 2020-01-11 NOTE — Care Management Important Message (Signed)
Important Message  Patient Details IM Letter given to Dessa Phi RN Case Manager to present to the Patient Name: Connie Miranda MRN: [id number] Date of Birth: Sep 07, 1933   Medicare Important Message Given:  Yes     Kerin Salen 01/11/2020, 12:31 PM

## 2020-01-11 NOTE — Plan of Care (Signed)
  Problem: Activity: Goal: Risk for activity intolerance will decrease Outcome: Progressing   Problem: Nutrition: Goal: Adequate nutrition will be maintained Outcome: Progressing   Problem: Elimination: Goal: Will not experience complications related to bowel motility Outcome: Progressing   Problem: Skin Integrity: Goal: Risk for impaired skin integrity will decrease Outcome: Progressing   

## 2020-01-11 NOTE — Progress Notes (Signed)
Physical Therapy Treatment Patient Details Name: Connie Miranda MRN: [id number] DOB: [birth year] Today's Date: 01/11/2020    History of Present Illness 86 yo female admitted with AMS, sepsis. Admitted 12/26-1/6 for similar. S/P IUD removal 01/02/20. Hx of bil LE edema, CHF, CVA-L hemiparesis, morbid obesity, anemia    PT Comments    Pt remains significantly limited due to lower extremity pain and body habitus.  She was not able to attempt transfers.  Performed very minimal ROM on legs due to pain.  Pt reporting use of lift at times at home but also states could scoot - questionable historian.  Pt remains total assist for all activity.    Follow Up Recommendations  SNF;Supervision/Assistance - 24 hour     Equipment Recommendations  None recommended by PT;Other (comment)(reports has lift at home)    Recommendations for Other Services       Precautions / Restrictions Precautions Precautions: Fall    Mobility  Bed Mobility               General bed mobility comments: Held due to severe pain with any movement on bil legs.  Based on clinical reasoning and from ther ex pt would be total A of 2 or 3 for any transfers and hoyer for OOB  Transfers                    Ambulation/Gait                 Stairs             Wheelchair Mobility    Modified Rankin (Stroke Patients Only)       Balance                                            Cognition Arousal/Alertness: Awake/alert Behavior During Therapy: WFL for tasks assessed/performed Overall Cognitive Status: No family/caregiver present to determine baseline cognitive functioning                                 General Comments: Pt with some level of confusion.  Reporting she could stand recently, but then mentioned lift or scooting to chair.      Exercises General Exercises - Lower Extremity Ankle Circles/Pumps: AROM;Both;Supine;20 reps Quad Sets: AROM;Both;10  reps;Supine Other Exercises Other Exercises: Pt kept hips externally rotated: worked on moving to neutral, AAROMx10, limited ROM due to pain Other Exercises: Knee ROM flex/ext (pt kept hips ER so allowed for knee ROM) AAROM x 10; very limited ROM (~5 degrees)    General Comments        Pertinent Vitals/Pain Pain Score: 10-Worst pain ever Pain Location: bil LEs-with movement Pain Intervention(s): Limited activity within patient's tolerance;Monitored during session    Home Living                      Prior Function            PT Goals (current goals can now be found in the care plan section) Progress towards PT goals: Progressing toward goals(limited progression due to pain)    Frequency    Min 2X/week      PT Plan      Co-evaluation              AM-PAC  PT "6 Clicks" Mobility   Outcome Measure  Help needed turning from your back to your side while in a flat bed without using bedrails?: Total Help needed moving from lying on your back to sitting on the side of a flat bed without using bedrails?: Total Help needed moving to and from a bed to a chair (including a wheelchair)?: Total Help needed standing up from a chair using your arms (e.g., wheelchair or bedside chair)?: Total Help needed to walk in hospital room?: Total Help needed climbing 3-5 steps with a railing? : Total 6 Click Score: 6    End of Session   Activity Tolerance: Patient limited by pain Patient left: in bed;with call bell/phone within reach;with bed alarm set Nurse Communication: Mobility status PT Visit Diagnosis: Muscle weakness (generalized) (M62.81);Other abnormalities of gait and mobility (R26.89)     Time: [id number] PT Time Calculation (min) (ACUTE ONLY): 20 min  Charges:  $Therapeutic Exercise: 8-22 mins                     Maggie Font, PT Acute Rehab Services Pager [phone] [facility] [phone] [address] [facility] [phone]    Karlton Lemon 01/11/2020, 4:45 PM

## 2020-01-11 NOTE — Progress Notes (Signed)
PROGRESS NOTE    Connie Miranda  [id number] DOB: [birth year] DOA: 01/05/2020 PCP: [name], MD    Brief Narrative:  86 y.o.femalewith medical history significant formorbid obesity, prior CVA, hypertension, chronic anemia, G6PD deficiency, bilateral lower extremity lymphedema, history of pemphigus, recent vulvar abscess, dysphagia, debility/bedbound for the last 5 years, brought to the ED with altered mental status/encephalopathy with fever as high as 103.Of note, patient was recently admitted on 12/24/2019 and discharged on 01/04/2020 for almost exactly the same similar issues (encephalopathy plus fever).During the last admission, patient had a retained old IUD and possible endometritis, started on broad-spectrum antibiotics and completed 10 days. GYN was consulted during that admission, had D&C removal of old IUD. Patient was subsequently diagnosed with endometrial adenocarcinoma. Patient overall got better, and eventually discharged.Spoke to daughter who reported that patient was having some chills,otherwise no other complaints. PTA, daughter noticed that she was confused, felt warm as well as notedthat she had vomited on herself.Daughter called EMS and patient was transported to the ED for further management. In the ED, patient was noted to be febrile, tachycardic,saturating above 95 on room air.Labs notable for elevated lactic acid at 2.8, WBC elevated at 24.9,Chest x-ray with no acute disease, CT abdomen showed marked volume of stool distending the rectum measuring about 18 x 8 cm, sigmoid diverticulosis without evidence of diverticulitis, CT head showed no evidence of acute intracranial abnormality.Code sepsis was activated, patient was started on broad-spectrum IV antibiotics, IV fluids, and admitted for further management.  Assessment & Plan:   Principal Problem:   Sepsis ([place]) Active Problems:   Hypertension   Stroke ([place])   Glucose-6-phosphate dehydrogenase  deficiency   Lymphedema   Morbid obesity ([place])   Endometrial carcinoma ([place])   Pressure injury of skin   Sepsis likely 2/2 ??VRE UTI -Febrile, tachycardia, lactic acidosis on admission -Currently afebrile, with resolved leukocytosis -Lactic acid elevated, trended down -UC grew 50,000 VRE, sensitive to linezolid -BC x2 NGTD -POC and PCR Covid test negative -Procalcitonin initially negative, trended up to 21.92--> 12.29-->6.75-->4.30 -Initial and repeat chest x-ray unremarkable -CT abdomen/pelvis showed marked volume of stool distending the rectum measuring about 18 x 8 cm, sigmoid diverticulosis without evidence of diverticulitis -D/C IV vancomycin, ceftazidime -ID had recommended PO linezolid for a total of 7 days.Dr. Horris Latino discussed with Dr Johnnye Sima on 01/09/20 -SLP evaluated patient, mild risk of aspiration, regular diet  Acute metabolic encephalopathy -seems much improved -Likely due to above -CT head unremarkable, reviewed -Monitor closely  Constipation -Had BMs since admission -CT abdomen/pelvis showed marked volume of stool distending the rectum measuring about 18 x 8 cm -Status post tapwater enema with good results -Continue with Senna, MiraLAX  History of bullous pemphigoid/bilateral lymphedema/bilateral chronic venous stasis changes -Possible ??source of sepsis -Significant swelling noted, will give Lasix -Wound care -Not on any steroids or immunomodulators -Monitor closely  Essential hypertension -BP remained stable -Restart amlodipine, Imdur -continue IV hydralazine as needed  Chronic diastolic HF -Difficult to assess fluid status due to body habitus and chronic lymphedema -F/u bmet in AM  Anemia of chronic disease Hemoglobin around baseline Noted to have some vaginal bleed F/u cbc in AM  History of CVA Patient is bedbound over 5 years Continue home Plavix as tolerated  Endometrioid adenocarcinoma, Figograde 2 CT abdomen/pelvis and  transvaginal ultrasound revealed old retained IUD endometrial thickening to 19.1 mm Underwent D&C with removal of old IUD and biopsy that revealed above GYN oncology consulted on last admission, recommended follow-up as an outpatient  to discuss further management, not a surgical candidate, looking more towords progesterone IUD in the future  Debility/physical deconditioning PT/OT rec SNF  Morbid obesity Lifestyle modification advised  DVT prophylaxis: Heparin subq Code Status: DNR Family Communication: Pt in room, family not at bedside Disposition Plan:  Possible d/c to SNF when bed available  Consultants:     Procedures:     Antimicrobials: Anti-infectives (From admission, onward)   Start     Dose/Rate Route Frequency Ordered Stop   01/10/20 2200  linezolid (ZYVOX) tablet 600 mg     600 mg Oral Every 12 hours 01/10/20 1225 01/16/20 0959   01/09/20 1115  linezolid (ZYVOX) IVPB 600 mg  Status:  Discontinued     600 mg 300 mL/hr over 60 Minutes Intravenous Every 12 hours 01/09/20 1041 01/10/20 1225   01/08/20 1400  cefTAZidime (FORTAZ) 2 g in sodium chloride 0.9 % 100 mL IVPB  Status:  Discontinued     2 g 200 mL/hr over 30 Minutes Intravenous Every 8 hours 01/08/20 1335 01/09/20 1042   01/07/20 1000  vancomycin (VANCOREADY) IVPB 1750 mg/350 mL  Status:  Discontinued     1,750 mg 175 mL/hr over 120 Minutes Intravenous Every 48 hours 01/05/20 1842 01/09/20 1042   01/06/20 0400  cefTAZidime (FORTAZ) 2 g in sodium chloride 0.9 % 100 mL IVPB  Status:  Discontinued     2 g 200 mL/hr over 30 Minutes Intravenous Every 12 hours 01/05/20 1842 01/08/20 1335   01/05/20 1515  cefTAZidime (FORTAZ) 2 g in sodium chloride 0.9 % 100 mL IVPB     2 g 200 mL/hr over 30 Minutes Intravenous  Once 01/05/20 1500 01/05/20 1626   01/05/20 1515  metroNIDAZOLE (FLAGYL) IVPB 500 mg     500 mg 100 mL/hr over 60 Minutes Intravenous  Once 01/05/20 1500 01/05/20 2019   01/05/20 1500  vancomycin  (VANCOCIN) 2,500 mg in sodium chloride 0.9 % 500 mL IVPB  Status:  Discontinued     2,500 mg 250 mL/hr over 120 Minutes Intravenous  Once 01/05/20 1431 01/05/20 1433   01/05/20 1500  vancomycin (VANCOREADY) IVPB 2000 mg/400 mL  Status:  Discontinued     2,000 mg 200 mL/hr over 120 Minutes Intravenous  Once 01/05/20 1433 01/05/20 1440   01/05/20 1500  vancomycin (VANCOREADY) IVPB 2000 mg/400 mL     2,000 mg 200 mL/hr over 120 Minutes Intravenous  Once 01/05/20 1451 01/05/20 1752   01/05/20 1445  ceFEPIme (MAXIPIME) 2 g in sodium chloride 0.9 % 100 mL IVPB  Status:  Discontinued     2 g 200 mL/hr over 30 Minutes Intravenous  Once 01/05/20 1431 01/05/20 1440       Subjective: Eager to go to rehab  Objective: Vitals:   01/10/20 0538 01/10/20 1256 01/10/20 2053 01/11/20 0409  BP: (!) 148/54 (!) 169/70 (!) 143/85 (!) 154/59  Pulse: 70 84 84 86  Resp: 16 15 18 20   Temp: 98.5 F (36.9 C) 98.6 F (37 C) 98.4 F (36.9 C) 97.9 F (36.6 C)  TempSrc: Oral Oral Oral Oral  SpO2: 94% 100% 96% 97%  Weight:      Height:        Intake/Output Summary (Last 24 hours) at 01/11/2020 1724 Last data filed at 01/11/2020 1330 Gross per 24 hour  Intake 644.88 ml  Output 1810 ml  Net -1165.12 ml   Filed Weights   01/05/20 1407 01/06/20 0232  Weight: 110 kg 116.6 kg    Examination:  General exam: Appears calm and comfortable  Respiratory system: Clear to auscultation. Respiratory effort normal. Cardiovascular system: S1 & S2 heard, Regular Gastrointestinal system: Abdomen is nondistended, soft and nontender. No organomegaly or masses felt. Normal bowel sounds heard. Central nervous system: Alert and oriented. No focal neurological deficits. Extremities: Symmetric 5 x 5 power. Skin: No rashes, lesions  Psychiatry: Judgement and insight appear normal. Mood & affect appropriate.   Data Reviewed: I have personally reviewed following labs and imaging studies  CBC: Recent Labs  Lab  01/07/20 0458 01/08/20 0458 01/09/20 0454 01/10/20 0420 01/11/20 0516  WBC 11.7* 6.5 5.5 5.1 5.6  NEUTROABS 10.2* 5.0 3.7 3.1 3.5  HGB 8.0* 7.2* 7.5* 7.7* 8.7*  HCT 27.3* 24.0* 24.2* 25.0* 28.7*  MCV 103.4* 100.8* 98.8 97.7 99.7  PLT 359 360 353 333 [id number]   Basic Metabolic Panel: Recent Labs  Lab 01/07/20 0458 01/08/20 0458 01/09/20 0454 01/10/20 0420 01/11/20 0516  NA 140 139 142 141 140  K 3.7 3.5 4.0 3.2* 3.9  CL 114* 111 109 108 107  CO2 21* 22 22 26 26   GLUCOSE 91 95 93 101* 95  BUN 20 19 14 9 8   CREATININE 0.97 0.83 0.71 0.66 0.62  CALCIUM 7.8* 8.0* 7.9* 8.6* 9.0   GFR: Estimated Creatinine Clearance: 65.7 mL/min (by C-G formula based on SCr of 0.62 mg/dL). Liver Function Tests: Recent Labs  Lab 01/05/20 1429 01/06/20 0415  AST 81* 60*  ALT 23 22  ALKPHOS 172* 121  BILITOT 1.0 1.0  PROT 6.9 6.0*  ALBUMIN 2.5* 2.1*   No results for input(s): LIPASE, AMYLASE in the last 168 hours. Recent Labs  Lab 01/05/20 1431  AMMONIA 34   Coagulation Profile: Recent Labs  Lab 01/05/20 1429  INR 1.2   Cardiac Enzymes: No results for input(s): CKTOTAL, CKMB, CKMBINDEX, TROPONINI in the last 168 hours. BNP (last 3 results) No results for input(s): PROBNP in the last 8760 hours. HbA1C: No results for input(s): HGBA1C in the last 72 hours. CBG: No results for input(s): GLUCAP in the last 168 hours. Lipid Profile: No results for input(s): CHOL, HDL, LDLCALC, TRIG, CHOLHDL, LDLDIRECT in the last 72 hours. Thyroid Function Tests: No results for input(s): TSH, T4TOTAL, FREET4, T3FREE, THYROIDAB in the last 72 hours. Anemia Panel: No results for input(s): VITAMINB12, FOLATE, FERRITIN, TIBC, IRON, RETICCTPCT in the last 72 hours. Sepsis Labs: Recent Labs  Lab 01/05/20 1429 01/05/20 1830 01/05/20 2030 01/07/20 0458 01/07/20 1436 01/08/20 0754 01/09/20 0454 01/10/20 0420  PROCALCITON <0.10  --   --  12.29  --  6.75 4.30 2.10  LATICACIDVEN 2.8* 2.4* 2.6*  --   1.3  --   --   --     Recent Results (from the past 240 hour(s))  Blood Culture (routine x 2)     Status: None   Collection Time: 01/05/20  2:29 PM   Specimen: BLOOD LEFT ARM  Result Value Ref Range Status   Specimen Description   Final    BLOOD LEFT ARM Performed at [facility] [address], [place], [place] [address]    Special Requests   Final    BOTTLES DRAWN AEROBIC AND ANAEROBIC Blood Culture adequate volume Performed at [facility] [address], [place], [place] [address]    Culture   Final    NO GROWTH 5 DAYS Performed at [facility], [place] [address], [place], [place] [address]    Report Status 01/10/2020 FINAL  Final  Blood Culture (routine x 2)     Status: None   Collection Time: 01/05/20  2:30 PM   Specimen: Left Antecubital; Blood  Result Value Ref Range Status   Specimen Description   Final    LEFT ANTECUBITAL Performed at [facility] [address] [place], [place] [address]    Special Requests   Final    BOTTLES DRAWN AEROBIC AND ANAEROBIC Blood Culture adequate volume Performed at [facility] [address], [place], [place] [address]    Culture   Final    NO GROWTH 5 DAYS Performed at [facility], [place] [address], [place], [place] [address]    Report Status 01/10/2020 FINAL  Final  SARS CORONAVIRUS 2 (TAT 6-24 HRS) Nasopharyngeal Nasopharyngeal Swab     Status: None   Collection Time: 01/05/20  6:27 PM   Specimen: Nasopharyngeal Swab  Result Value Ref Range Status   SARS Coronavirus 2 NEGATIVE NEGATIVE Final    Comment: (NOTE) SARS-CoV-2 target nucleic acids are NOT DETECTED. The SARS-CoV-2 RNA is generally detectable in upper and lower respiratory specimens during the acute phase of infection. Negative results do not preclude SARS-CoV-2 infection, do not rule out co-infections with other pathogens, and should not be used as the sole basis for  treatment or other patient management decisions. Negative results must be combined with clinical observations, patient history, and epidemiological information. The expected result is Negative. Fact Sheet for Patients: SugarRoll.be Fact Sheet for Healthcare Providers: https://www.woods-mathews.com/ This test is not yet approved or cleared by the [place] FDA and  has been authorized for detection and/or diagnosis of SARS-CoV-2 by FDA under an Emergency Use Authorization (EUA). This EUA will remain  in effect (meaning this test can be used) for the duration of the COVID-19 declaration under Section 56 4(b)(1) of the Act, 21 U.S.C. section 360bbb-3(b)(1), unless the authorization is terminated or revoked sooner. Performed at [facility], [place] [address], [place], [place] [address]   Urine culture     Status: Abnormal   Collection Time: 01/07/20  5:42 AM   Specimen: In/Out Cath Urine  Result Value Ref Range Status   Specimen Description   Final    IN/OUT CATH URINE Performed at [facility] [address] [place], [place] [address]    Special Requests   Final    NONE Performed at [facility] [place] [address] [place], [place] [address]    Culture (A)  Final    50,000 COLONIES/mL VANCOMYCIN RESISTANT ENTEROCOCCUS   Report Status 01/09/2020 FINAL  Final   Organism ID, Bacteria VANCOMYCIN RESISTANT ENTEROCOCCUS (A)  Final      Susceptibility   Vancomycin resistant enterococcus - MIC*    AMPICILLIN >=32 RESISTANT Resistant     NITROFURANTOIN 128 RESISTANT Resistant     VANCOMYCIN >=32 RESISTANT Resistant     LINEZOLID 2 SENSITIVE Sensitive     * 50,000 COLONIES/mL VANCOMYCIN RESISTANT ENTEROCOCCUS     Radiology Studies: No results found.  Scheduled Meds: . amLODipine  10 mg Oral Daily  . clopidogrel  75 mg Oral Daily  . furosemide  40 mg Intravenous Daily  . heparin  5,000 Units  Subcutaneous Q8H  . isosorbide mononitrate  120 mg Oral Daily  . linezolid  600 mg Oral Q12H  . polyethylene glycol  17 g Oral Daily  . senna-docusate  1 tablet Oral QHS   Continuous Infusions: . sodium chloride Stopped (  01/10/20 1800)     LOS: 6 days   Marylu Lund, MD [facility] Pager On Amion  If 7PM-7AM, please contact night-coverage 01/11/2020, 5:24 PM

## 2020-01-12 LAB — BASIC METABOLIC PANEL
Anion gap: 8 (ref 5–15)
BUN: 8 mg/dL (ref 8–23)
CO2: 27 mmol/L (ref 22–32)
Calcium: 8.8 mg/dL — ABNORMAL LOW (ref 8.9–10.3)
Chloride: 105 mmol/L (ref 98–111)
Creatinine, Ser: 0.71 mg/dL (ref 0.44–1.00)
GFR calc Af Amer: >60 mL/min (ref >60)
GFR calc non Af Amer: >60 mL/min (ref >60)
Glucose, Bld: 100 mg/dL — ABNORMAL HIGH (ref 70–99)
Potassium: 3.4 mmol/L — ABNORMAL LOW (ref 3.5–5.1)
Sodium: 140 mmol/L (ref 135–145)

## 2020-01-12 LAB — CBC WITH DIFFERENTIAL/PLATELET
Abs Immature Granulocytes: 0.04 10*3/uL (ref 0.00–0.07)
Basophils Absolute: 0.1 10*3/uL (ref 0.0–0.1)
Basophils Relative: 1 %
Eosinophils Absolute: 0.2 10*3/uL (ref 0.0–0.5)
Eosinophils Relative: 4 %
HCT: 26.6 % — ABNORMAL LOW (ref 36.0–46.0)
Hemoglobin: 8 g/dL — ABNORMAL LOW (ref 12.0–15.0)
Immature Granulocytes: 1 %
Lymphocytes Relative: 23 %
Lymphs Abs: 1.4 10*3/uL (ref 0.7–4.0)
MCH: 29.7 pg (ref 26.0–34.0)
MCHC: 30.1 g/dL (ref 30.0–36.0)
MCV: 98.9 fL (ref 80.0–100.0)
Monocytes Absolute: 0.5 10*3/uL (ref 0.1–1.0)
Monocytes Relative: 8 %
Neutro Abs: 3.8 10*3/uL (ref 1.7–7.7)
Neutrophils Relative %: 63 %
Platelets: 408 10*3/uL — ABNORMAL HIGH (ref 150–400)
RBC: 2.69 MIL/uL — ABNORMAL LOW (ref 3.87–5.11)
RDW: 19.2 % — ABNORMAL HIGH (ref 11.5–15.5)
WBC: 6.1 10*3/uL (ref 4.0–10.5)
nRBC: 0 % (ref 0.0–0.2)

## 2020-01-12 MED ORDER — FUROSEMIDE 40 MG PO TABS: 120.0000 mg | ORAL_TABLET | Freq: Every day | ORAL | 11 refills | Status: AC

## 2020-01-12 MED ORDER — LINEZOLID 600 MG PO TABS: 600.0000 mg | ORAL_TABLET | Freq: Two times a day (BID) | ORAL | 0 refills | Status: AC

## 2020-01-12 MED ORDER — POTASSIUM CHLORIDE CRYS ER 20 MEQ PO TBCR
40.0000 meq | EXTENDED_RELEASE_TABLET | Freq: Once | ORAL | Status: DC
  Filled 2020-01-12: qty 2

## 2020-01-12 NOTE — Discharge Summary (Signed)
Physician Discharge Summary  NAZARIA CASALI [id number] DOB: July 12, 1933 DOA: 01/05/2020  PCP: [name], MD  Admit date: 01/05/2020 Discharge date: 01/12/2020  Admitted From: Home Disposition:  SNF  Recommendations for Outpatient Follow-up:  1. Follow up with PCP in 1-2 weeks  Discharge Condition:Improved CODE STATUS:DNR Diet recommendation: Heart healthy   Brief/Interim Summary: 86 y.o.femalewith medical history significant formorbid obesity, prior CVA, hypertension, chronic anemia, G6PD deficiency, bilateral lower extremity lymphedema, history of pemphigus, recent vulvar abscess, dysphagia, debility/bedbound for the last 5 years, brought to the ED with altered mental status/encephalopathy with fever as high as 103.Of note, patient was recently admitted on 12/24/2019 and discharged on 01/04/2020 for almost exactly the same similar issues (encephalopathy plus fever).During the last admission, patient had a retained old IUD and possible endometritis, started on broad-spectrum antibiotics and completed 10 days. GYN was consulted during that admission, had D&C removal of old IUD. Patient was subsequently diagnosed with endometrial adenocarcinoma. Patient overall got better, and eventually discharged.Spoke to daughter who reported that patient was having some chills,otherwise no other complaints. PTA, daughter noticed that she was confused, felt warm as well as notedthat she had vomited on herself.Daughter called EMS and patient was transported to the ED for further management. In the ED, patient was noted to be febrile, tachycardic,saturating above 95 on room air.Labs notable for elevated lactic acid at 2.8, WBC elevated at 24.9,Chest x-ray with no acute disease, CT abdomen showed marked volume of stool distending the rectum measuring about 18 x 8 cm, sigmoid diverticulosis without evidence of diverticulitis, CT head showed no evidence of acute intracranial abnormality.Code  sepsis was activated, patient was started on broad-spectrum IV antibiotics, IV fluids, and admitted for further management.  Discharge Diagnoses:  Principal Problem:   Sepsis ([place]) Active Problems:   Hypertension   Stroke ([place])   Glucose-6-phosphate dehydrogenase deficiency   Lymphedema   Morbid obesity ([place])   Endometrial carcinoma ([place])   Pressure injury of skin   Sepsis likely 2/2 ??VRE UTI -Febrile, tachycardia, lactic acidosis on admission -Currently afebrile, with resolved leukocytosis -Lactic acid elevated, trended down -UC grew 50,000 VRE, sensitive to linezolid -BC x2 NGTD -POC and PCR Covid test negative -Initial and repeat chest x-ray unremarkable -CT abdomen/pelvis showed marked volume of stool distending the rectum measuring about 18 x 8 cm, sigmoid diverticulosis without evidence of diverticulitis -D/C IV vancomycin, ceftazidime -ID had recommended POlinezolidfor a total of 7 days.Dr. Horris Latino discussed with Dr Johnnye Sima on 01/09/20 -SLP evaluated patient, mild risk of aspiration, regular diet  Acute metabolic encephalopathy -seems much improved -Likely due to above -CT head unremarkable, reviewed -Monitor closely  Constipation -Had BMs since admission -CT abdomen/pelvis showed marked volume of stool distending the rectum measuring about 18 x 8 cm -Status post tapwater enema with good results -Continue with Senna, MiraLAX  History of bullous pemphigoid/bilateral lymphedema/bilateral chronic venous stasis changes -Possible ??source of sepsis -Significant swelling noted, will give Lasix -Wound care -Not on any steroids or immunomodulators -Monitor closely  Essential hypertension -BP remained stable -Restart amlodipine, Imdur -continue IV hydralazine as needed  Chronic diastolic HF -Difficult to assess fluid status due to body habitus and chronic lymphedema -F/u bmet in AM  Anemia of chronic disease Hemoglobin around baseline Noted to have  some vaginal bleed F/u cbc in AM  History of CVA Patient is bedbound over 5 years Continue home Plavix as tolerated  Endometrioid adenocarcinoma, Figograde 2 CT abdomen/pelvis and transvaginal ultrasound revealed old retained IUD endometrial thickening to 19.1 mm Underwent  D&C with removal of old IUD and biopsy that revealed above GYN oncology consulted on last admission, recommended follow-up as an outpatient to discuss further management, not a surgical candidate, looking more towords progesterone IUD in the future  Debility/physical deconditioning PT/OT rec SNF  Morbid obesity Lifestyle modification advised  Discharge Instructions   Allergies as of 01/12/2020      Reactions   Asa [aspirin] Other (See Comments)   Doctor told her not to take due to type anemia   Phosphate Other (See Comments)   Anything containing phosphate 6. Unknown reaction   Codeine Other (See Comments)   Made her too sleepy   Penicillins Itching, Swelling   Tolerates keflex Did it involve swelling of the face/tongue/throat, SOB, or low BP? unknown Did it involve sudden or severe rash/hives, skin peeling, or any reaction on the inside of your mouth or nose? unknown Did you need to seek medical attention at a hospital or doctor's office? unknown When did it last happen?years ago. If all above answers are "NO", may proceed with cephalosporin use.   Sodium Phosphate       Medication List    TAKE these medications   acetaminophen 500 MG tablet Commonly known as: TYLENOL Take 500 mg by mouth every 6 (six) hours as needed for moderate pain.   amLODipine 10 MG tablet Commonly known as: NORVASC Take 1 tablet (10 mg total) by mouth daily.   CALCIUM + D PO Take 1 tablet by mouth daily.   clopidogrel 75 MG tablet Commonly known as: PLAVIX Take 1 tablet (75 mg total) by mouth daily.   cyanocobalamin 1000 MCG/ML injection Commonly known as: (VITAMIN B-12) Inject 1 mL (1,000 mcg total)  into the muscle every 30 (thirty) days.   furosemide 40 MG tablet Commonly known as: Lasix Take 3 tablets (120 mg total) by mouth daily.   isosorbide mononitrate 120 MG 24 hr tablet Commonly known as: IMDUR Take 120 mg by mouth daily.   linezolid 600 MG tablet Commonly known as: ZYVOX Take 1 tablet (600 mg total) by mouth every 12 (twelve) hours for 3 days.   mineral oil-hydrophilic petrolatum ointment Apply 1 application topically daily. To lower extremities   multivitamin with minerals Tabs tablet Take 1 tablet by mouth daily.   polyethylene glycol 17 g packet Commonly known as: MIRALAX / GLYCOLAX Take 17 g by mouth daily. What changed:   when to take this  reasons to take this   senna-docusate 8.6-50 MG tablet Commonly known as: Senokot-S Take 1 tablet by mouth at bedtime.      Contact information for after-discharge care    Destination    [facility]erred [facility] .   Service: Skilled Nursing Contact information: [place] [address] [phone]             Allergies  Allergen Reactions  . Asa [Aspirin] Other (See Comments)    Doctor told her not to take due to type anemia  . Phosphate Other (See Comments)    Anything containing phosphate 6. Unknown reaction  . Codeine Other (See Comments)    Made her too sleepy  . Penicillins Itching and Swelling    Tolerates keflex  Did it involve swelling of the face/tongue/throat, SOB, or low BP? unknown Did it involve sudden or severe rash/hives, skin peeling, or any reaction on the inside of your mouth or nose? unknown Did you need to seek medical attention at a hospital or doctor's office? unknown When  did it last happen?years ago. If all above answers are "NO", may proceed with cephalosporin use.   . Sodium Phosphate     Consultations:    Procedures/Studies: CT ABDOMEN PELVIS WO CONTRAST  Result Date: 12/24/2019 CLINICAL DATA:  History of vaginal abscess.  EXAM: CT ABDOMEN AND PELVIS WITHOUT CONTRAST TECHNIQUE: Multidetector CT imaging of the abdomen and pelvis was performed following the standard protocol without IV contrast. COMPARISON:  12/15/2019 FINDINGS: Lower chest: Unremarkable. Hepatobiliary: No focal abnormality in the liver on this study without intravenous contrast. Gallbladder is collapsed. Gallstones demonstrated by cholangiogram on 06/11/2017 are not evident on this CT. No intrahepatic or extrahepatic biliary dilation. Pancreas: No focal mass lesion. No dilatation of the main duct. No intraparenchymal cyst. No peripancreatic edema. Spleen: No splenomegaly. No focal mass lesion. Adrenals/Urinary Tract: No adrenal nodule or mass. Kidneys unremarkable. No evidence for hydroureter. Bladder decompressed. Stomach/Bowel: Stomach is unremarkable. No gastric wall thickening. No evidence of outlet obstruction. Duodenum is normally positioned as is the ligament of Treitz. No small bowel wall thickening. No small bowel dilatation. The terminal ileum is normal. The appendix is normal. Diverticular changes are noted in the left colon without evidence of diverticulitis. Marked stool volume noted in the rectum with rectal diameter measuring 12.8 x 9.3 cm, similar to prior. No substantial perirectal edema or inflammation at this time. Vascular/Lymphatic: There is abdominal aortic atherosclerosis without aneurysm. There is no gastrohepatic or hepatoduodenal ligament lymphadenopathy. No retroperitoneal or mesenteric lymphadenopathy. No pelvic sidewall lymphadenopathy. Reproductive: IUD visualized in the uterus. Uterus appears thickened and prior study with contrast material demonstrated marked prominence of the endometrium. There is no adnexal mass. The right-sided vaginal fluid collection measured on the previous study appears decreased. There also appears to be a relatively stable fluid collection involving the left labia majora. This is a focal abnormality and there is  no edema or inflammation in the soft tissues of the perineum. There is no gas in the soft tissues of the perineum. Other: No intraperitoneal free fluid. Musculoskeletal: Bones are diffusely demineralized. No worrisome lytic or sclerotic osseous abnormality. IMPRESSION: 1. The previously measured right vaginal fluid collection at 2.8 x 1 point 0 cm is not clearly demonstrated on today's study. There does appear to be a fluid collection in the left labia majora measuring 4.3 x 1.7 cm, similar to prior study. No features on today's study to suggest necrotizing fasciitis. 2. Marked apparent thickening of the endometrium. Neoplasm not excluded. Pelvic ultrasound recommended to further evaluate. 3. Collapsed gallbladder. Gallstones documented on previous cholecystostomy tube injection are not visualized on today's CT. 4.  Aortic Atherosclerois (ICD10-170.0) Electronically Signed   By: Misty Stanley M.D.   On: 12/24/2019 12:44   DG Chest 2 View  Result Date: 12/24/2019 CLINICAL DATA:  Altered mental status, shortness of breath. EXAM: CHEST - 2 VIEW COMPARISON:  None. FINDINGS: The heart size and mediastinal contours are within normal limits. Both lungs are clear. The visualized skeletal structures are stable. IMPRESSION: No active cardiopulmonary disease. Electronically Signed   By: Abelardo Diesel M.D.   On: 12/24/2019 12:02   CT Head Wo Contrast  Result Date: 01/05/2020 CLINICAL DATA:  Ataxia, stroke suspected. Altered mental status (AMS), unclear cause. EXAM: CT HEAD WITHOUT CONTRAST TECHNIQUE: Contiguous axial images were obtained from the base of the skull through the vertex without intravenous contrast. COMPARISON:  Brain MRI 12/29/2019, head CT 12/26/2021 FINDINGS: Brain: No evidence of acute intracranial hemorrhage. No demarcated cortical infarction. No evidence of intracranial mass.  No midline shift or extra-axial fluid collection. Stable ill-defined hypoattenuation within the cerebral white matter which is  nonspecific, but consistent with chronic small vessel ischemic disease. Mild generalized parenchymal atrophy. Vascular: No hyperdense vessel. Skull: Normal. Negative for fracture or focal lesion. Sinuses/Orbits: Visualized orbits demonstrate no acute abnormality. Minimal ethmoid sinus mucosal thickening. No significant mastoid effusion. IMPRESSION: No CT evidence of acute intracranial abnormality. Generalized parenchymal atrophy and chronic small vessel ischemic disease. Electronically Signed   By: Kellie Simmering DO   On: 01/05/2020 17:15   CT HEAD WO CONTRAST  Result Date: 12/27/2019 CLINICAL DATA:  Altered mental status EXAM: CT HEAD WITHOUT CONTRAST TECHNIQUE: Contiguous axial images were obtained from the base of the skull through the vertex without intravenous contrast. COMPARISON:  CT head dated 03/24/2019 FINDINGS: Brain: No evidence of acute infarction, hemorrhage, hydrocephalus, extra-axial collection or mass lesion/mass effect. There is moderate cerebral volume loss with associated ex vacuo dilatation. Periventricular white matter hypoattenuation likely represents chronic small vessel ischemic disease. Vascular: There are vascular calcifications in the carotid siphons. Skull: Normal. Negative for fracture or focal lesion. Sinuses/Orbits: No acute finding. Other: None. IMPRESSION: 1. No acute intracranial process. Electronically Signed   By: Zerita Boers M.D.   On: 12/27/2019 10:42   CT PELVIS W CONTRAST  Result Date: 12/15/2019 CLINICAL DATA:  Lymphadenopathy with rectal bleeding and possible inguinal abscess. EXAM: CT PELVIS WITH CONTRAST TECHNIQUE: Multidetector CT imaging of the pelvis was performed using the standard protocol following the bolus administration of intravenous contrast. CONTRAST:  18mL OMNIPAQUE IOHEXOL 300 MG/ML  SOLN COMPARISON:  CT of the pelvis dated April 06, 2017. FINDINGS: Urinary Tract:  No abnormality visualized. Bowel: There is a very large amount of stool at the level  of the rectum. There is sigmoid diverticulosis without CT evidence for diverticulitis. The remaining small bowel is unremarkable. The appendix is unremarkable. Vascular/Lymphatic: There are atherosclerotic changes involving the abdominal aorta and the common iliac vasculature. Reproductive: There is diffuse enlargement of the uterus with extensive thickening of the endometrial stripe. There is a probable small to moderate volume of free fluid in the patient's endometrial canal. A loop IUD is noted in the lower uterine segment. There is a small amount of fluid in the vaginal vault. The ovaries appeared grossly unremarkable where visualized. Other: There is a possible thin 2.8 by 1 cm fluid collection in the right vulva. There is mild surrounding fat stranding. There are no pathologically enlarged inguinal lymph nodes. No pathologically enlarged pelvic lymph nodes. Musculoskeletal: There are advanced degenerative changes at the L5-S1 level. There is diffuse osteopenia which limits detection of nondisplaced fractures. IMPRESSION: 1. There is a possible thin 2.8 x 1 cm fluid collection in the right vulva. This may represent a small abscess in the appropriate clinical setting. 2. Marked thickening of the endometrial stripe with a loop IUD in the lower uterine segment. Ultrasound follow-up is recommended. 3. Sigmoid diverticulosis without CT evidence for diverticulitis. 4. Large amount of stool at the level of the rectum. Correlate for constipation/fecal impaction. 5. Diffuse osteopenia. Aortic Atherosclerosis (ICD10-I70.0). Electronically Signed   By: Constance Holster M.D.   On: 12/15/2019 19:59   MR BRAIN WO CONTRAST  Result Date: 12/29/2019 CLINICAL DATA:  Sudden onset encephalopathy EXAM: MRI HEAD WITHOUT CONTRAST TECHNIQUE: Multiplanar, multiecho pulse sequences of the brain and surrounding structures were obtained without intravenous contrast. COMPARISON:  MRI head 04/10/2013 FINDINGS: Brain: Moderate atrophy  and ventricular enlargement. Mild chronic white matter changes similar to the  prior study. No acute infarct, hemorrhage, mass. No fluid collection or midline shift. Vascular: Normal arterial flow voids. Skull and upper cervical spine: Negative Sinuses/Orbits: Mild mucosal edema paranasal sinuses. No orbital mass. Other: None IMPRESSION: Moderate atrophy with mild chronic microvascular ischemic change in the white matter No acute intracranial abnormality. Electronically Signed   By: Franchot Gallo M.D.   On: 12/29/2019 14:12   US PELVIS (TRANSABDOMINAL ONLY)  Result Date: 12/26/2019 CLINICAL DATA:  Initial evaluation for thickened endometrium. EXAM: TRANSABDOMINAL ULTRASOUND OF PELVIS TECHNIQUE: Transabdominal ultrasound examination of the pelvis was performed including evaluation of the uterus, ovaries, adnexal regions, and pelvic cul-de-sac. COMPARISON:  Prior CT from 12/24/2019. FINDINGS: Uterus Measurements: 11.0 x 5.8 x 7.4 cm = volume: 248.8 mL. No fibroids or other mass visualized. Endometrium Thickness: 19.1 mm.  No focal abnormality visualized. Right ovary Not visualized.  No adnexal mass. Left ovary Not visualized.  No adnexal mass. Other findings:  No abnormal free fluid. IMPRESSION: 1. Thickened endometrial stripe measuring up to 19.1 mm in thickness. Endometrial thickness is considered abnormal for an asymptomatic post-menopausal female. Endometrial sampling should be considered to exclude carcinoma. 2. Nonvisualization of the ovaries. No adnexal mass or abnormal free fluid within the pelvis. 3. No other acute abnormality. Electronically Signed   By: Jeannine Boga M.D.   On: 12/26/2019 21:13   CT ABDOMEN PELVIS W CONTRAST  Result Date: 01/05/2020 CLINICAL DATA:  Abdominal distension. Recent diagnosis of endometrial adenocarcinoma EXAM: CT ABDOMEN AND PELVIS WITH CONTRAST TECHNIQUE: Multidetector CT imaging of the abdomen and pelvis was performed using the standard protocol following bolus  administration of intravenous contrast. CONTRAST:  157mL OMNIPAQUE IOHEXOL 300 MG/ML  SOLN COMPARISON:  12/24/2019 FINDINGS: Lower chest: No acute abnormality. Hepatobiliary: Liver is within normal limits. Gallbladder is contracted, limiting its evaluation. No gallstone is evident. No biliary dilatation. Pancreas: Unremarkable. No pancreatic ductal dilatation or surrounding inflammatory changes. Spleen: Normal in size without focal abnormality. Adrenals/Urinary Tract: No adrenal nodule. Kidneys enhance symmetrically. No hydronephrosis. Urinary bladder is decompressed, limiting its evaluation. Stomach/Bowel: Marked volume of stool distending the rectum measuring approximately 18 x 8 cm. Scattered sigmoid diverticulosis. No pericolonic inflammatory changes. Normal appendix in the right lower quadrant. No dilated loops of small bowel. Small hiatal hernia. Stomach otherwise unremarkable. Vascular/Lymphatic: Scattered atherosclerotic disease. No acute vascular findings. No abdominal or pelvic lymphadenopathy. Reproductive: Abnormally thickened endometrium. No adnexal masses. Other: There is a trace amount of free fluid within the abdomen and pelvis. No pneumoperitoneum. No abdominal wall hernia. Musculoskeletal: Mild diffuse body wall edema. Severe degenerative changes of the lumbar spine without new or acute osseous finding. Bones are diffusely demineralized. IMPRESSION: 1. Marked volume of stool distending the rectum measuring approximately 18 x 8 cm. 2. Trace amount of free fluid within the abdomen and pelvis. Which could be related to patient fluid status as there is also body wall edema. 3. Abnormally thickened endometrium, similar to the prior study. 4. Sigmoid diverticulosis without evidence of diverticulitis. 5. Aortic atherosclerosis. Aortic Atherosclerosis (ICD10-I70.0). Electronically Signed   By: Davina Poke D.O.   On: 01/05/2020 17:22   DG Chest Port 1 View  Result Date: 01/06/2020 CLINICAL DATA:   Sepsis EXAM: PORTABLE CHEST 1 VIEW COMPARISON:  Chest radiograph 01/05/2020 FINDINGS: Heart size within normal limits. No airspace consolidation. No evidence of pleural effusion or pneumothorax. No acute bony abnormality. Overlying cardiac monitoring leads. IMPRESSION: No evidence of acute cardiopulmonary abnormality. Electronically Signed   By: Kellie Simmering DO   On:  01/06/2020 08:10   DG Chest Port 1 View  Result Date: 01/05/2020 CLINICAL DATA:  Altered mental status, lethargy and fever. EXAM: PORTABLE CHEST 1 VIEW COMPARISON:  PA and lateral chest 12/24/2019. Single-view of the chest 06/11/2018. FINDINGS: Lungs clear. Heart size normal. No pneumothorax or pleural effusion. Mild elevation of the right hemidiaphragm relative to the left noted. No acute or focal bony abnormality. IMPRESSION: No acute disease. Electronically Signed   By: Inge Rise M.D.   On: 01/05/2020 15:12     Subjective: Eager to go to rehab  Discharge Exam: Vitals:   01/11/20 2046 01/12/20 0416  BP: (!) 149/64 (!) 161/78  Pulse: 89 87  Resp: 18 18  Temp: 98.4 F (36.9 C) 97.9 F (36.6 C)  SpO2: 95% 95%   Vitals:   01/10/20 2053 01/11/20 0409 01/11/20 2046 01/12/20 0416  BP: (!) 143/85 (!) 154/59 (!) 149/64 (!) 161/78  Pulse: 84 86 89 87  Resp: 18 20 18 18   Temp: 98.4 F (36.9 C) 97.9 F (36.6 C) 98.4 F (36.9 C) 97.9 F (36.6 C)  TempSrc: Oral Oral Oral Oral  SpO2: 96% 97% 95% 95%  Weight:      Height:        General: Pt is alert, awake, not in acute distress Cardiovascular: RRR, S1/S2 +, no rubs, no gallops Respiratory: CTA bilaterally, no wheezing, no rhonchi Abdominal: Soft, NT, ND, bowel sounds + Extremities: no edema, no cyanosis   The results of significant diagnostics from this hospitalization (including imaging, microbiology, ancillary and laboratory) are listed below for reference.     Microbiology: Recent Results (from the past 240 hour(s))  Blood Culture (routine x 2)     Status:  None   Collection Time: 01/05/20  2:29 PM   Specimen: BLOOD LEFT ARM  Result Value Ref Range Status   Specimen Description   Final    BLOOD LEFT ARM Performed at [facility] [address], [place], [place] [address]    Special Requests   Final    BOTTLES DRAWN AEROBIC AND ANAEROBIC Blood Culture adequate volume Performed at [facility] [address], [place], [place] [address]    Culture   Final    NO GROWTH 5 DAYS Performed at [facility], [place] [address] [place], [place] [address]    Report Status 01/10/2020 FINAL  Final  Blood Culture (routine x 2)     Status: None   Collection Time: 01/05/20  2:30 PM   Specimen: Left Antecubital; Blood  Result Value Ref Range Status   Specimen Description   Final    LEFT ANTECUBITAL Performed at [facility] [address] [place], [place] [address]    Special Requests   Final    BOTTLES DRAWN AEROBIC AND ANAEROBIC Blood Culture adequate volume Performed at [facility] [address] [place], [place] [address]    Culture   Final    NO GROWTH 5 DAYS Performed at [facility], [place] [address], [place],  [address]    Report Status 01/10/2020 FINAL  Final  SARS CORONAVIRUS 2 (TAT 6-24 HRS) Nasopharyngeal Nasopharyngeal Swab     Status: None   Collection Time: 01/05/20  6:27 PM   Specimen: Nasopharyngeal Swab  Result Value Ref Range Status   SARS Coronavirus 2 NEGATIVE NEGATIVE Final    Comment: (NOTE) SARS-CoV-2 target nucleic acids are NOT DETECTED. The SARS-CoV-2 RNA is generally detectable in upper and  lower respiratory specimens during the acute phase of infection. Negative results do not preclude SARS-CoV-2 infection, do not rule out co-infections with other pathogens, and should not be used as the sole basis for treatment or other patient management decisions. Negative results must be combined with clinical  observations, patient history, and epidemiological information. The expected result is Negative. Fact Sheet for Patients: SugarRoll.be Fact Sheet for Healthcare Providers: https://www.woods-mathews.com/ This test is not yet approved or cleared by the [place] FDA and  has been authorized for detection and/or diagnosis of SARS-CoV-2 by FDA under an Emergency Use Authorization (EUA). This EUA will remain  in effect (meaning this test can be used) for the duration of the COVID-19 declaration under Section 56 4(b)(1) of the Act, 21 U.S.C. section 360bbb-3(b)(1), unless the authorization is terminated or revoked sooner. Performed at [facility], [place] [address], [place], [place] [address]   Urine culture     Status: Abnormal   Collection Time: 01/07/20  5:42 AM   Specimen: In/Out Cath Urine  Result Value Ref Range Status   Specimen Description   Final    IN/OUT CATH URINE Performed at [facility] [address], [place], [place] [address]    Special Requests   Final    NONE Performed at [facility], [place] [address] [place], [place] [address]    Culture (A)  Final    50,000 COLONIES/mL VANCOMYCIN RESISTANT ENTEROCOCCUS   Report Status 01/09/2020 FINAL  Final   Organism ID, Bacteria VANCOMYCIN RESISTANT ENTEROCOCCUS (A)  Final      Susceptibility   Vancomycin resistant enterococcus - MIC*    AMPICILLIN >=32 RESISTANT Resistant     NITROFURANTOIN 128 RESISTANT Resistant     VANCOMYCIN >=32 RESISTANT Resistant     LINEZOLID 2 SENSITIVE Sensitive     * 50,000 COLONIES/mL VANCOMYCIN RESISTANT ENTEROCOCCUS  SARS CORONAVIRUS 2 (TAT 6-24 HRS) Nasopharyngeal Nasopharyngeal Swab     Status: None   Collection Time: 01/11/20  4:13 PM   Specimen: Nasopharyngeal Swab  Result Value Ref Range Status   SARS Coronavirus 2 NEGATIVE NEGATIVE Final    Comment: (NOTE) SARS-CoV-2 target nucleic acids are NOT  DETECTED. The SARS-CoV-2 RNA is generally detectable in upper and lower respiratory specimens during the acute phase of infection. Negative results do not preclude SARS-CoV-2 infection, do not rule out co-infections with other pathogens, and should not be used as the sole basis for treatment or other patient management decisions. Negative results must be combined with clinical observations, patient history, and epidemiological information. The expected result is Negative. Fact Sheet for Patients: SugarRoll.be Fact Sheet for Healthcare Providers: https://www.woods-mathews.com/ This test is not yet approved or cleared by the [place] FDA and  has been authorized for detection and/or diagnosis of SARS-CoV-2 by FDA under an Emergency Use Authorization (EUA). This EUA will remain  in effect (meaning this test can be used) for the duration of the COVID-19 declaration under Section 56 4(b)(1) of the Act, 21 U.S.C. section 360bbb-3(b)(1), unless the authorization is terminated or revoked sooner. Performed at [facility], [place] [address] [place], [place] [address]      Labs: BNP (last 3 results) No results for input(s): BNP in the last 8760 hours. Basic Metabolic Panel: Recent Labs  Lab 01/08/20 0458 01/09/20 0454 01/10/20 0420 01/11/20 0516 01/12/20 0420  NA 139 142 141 140 140  K 3.5 4.0 3.2* 3.9 3.4*  CL 111 109 108 107 105  CO2 22 22 26 26 27   GLUCOSE 95 93 101* 95 100*  BUN 19 14 9 8 8   CREATININE 0.83 0.71 0.66 0.62 0.71  CALCIUM 8.0* 7.9* 8.6* 9.0 8.8*   Liver Function Tests: Recent Labs  Lab 01/05/20 1429 01/06/20 0415  AST 81* 60*  ALT 23 22  ALKPHOS 172* 121  BILITOT 1.0 1.0  PROT 6.9 6.0*  ALBUMIN 2.5* 2.1*   No results for input(s): LIPASE, AMYLASE in the last 168 hours. Recent Labs  Lab 01/05/20 1431  AMMONIA 34   CBC: Recent Labs  Lab 01/08/20 0458 01/09/20 0454 01/10/20 0420 01/11/20 0516  01/12/20 0420  WBC 6.5 5.5 5.1 5.6 6.1  NEUTROABS 5.0 3.7 3.1 3.5 3.8  HGB 7.2* 7.5* 7.7* 8.7* 8.0*  HCT 24.0* 24.2* 25.0* 28.7* 26.6*  MCV 100.8* 98.8 97.7 99.7 98.9  PLT 360 353 333 377 408*   Cardiac Enzymes: No results for input(s): CKTOTAL, CKMB, CKMBINDEX, TROPONINI in the last 168 hours. BNP: Invalid input(s): POCBNP CBG: No results for input(s): GLUCAP in the last 168 hours. D-Dimer No results for input(s): DDIMER in the last 72 hours. Hgb A1c No results for input(s): HGBA1C in the last 72 hours. Lipid Profile No results for input(s): CHOL, HDL, LDLCALC, TRIG, CHOLHDL, LDLDIRECT in the last 72 hours. Thyroid function studies No results for input(s): TSH, T4TOTAL, T3FREE, THYROIDAB in the last 72 hours.  Invalid input(s): FREET3 Anemia work up No results for input(s): VITAMINB12, FOLATE, FERRITIN, TIBC, IRON, RETICCTPCT in the last 72 hours. Urinalysis    Component Value Date/Time   COLORURINE ORANGE (A) 12/24/2019 1030   APPEARANCEUR TURBID (A) 12/24/2019 1030   LABSPEC >1.030 (H) 12/24/2019 1030   PHURINE 5.0 12/24/2019 1030   GLUCOSEU NEGATIVE 12/24/2019 1030   HGBUR LARGE (A) 12/24/2019 1030   BILIRUBINUR MODERATE (A) 12/24/2019 1030   KETONESUR 15 (A) 12/24/2019 1030   PROTEINUR 100 (A) 12/24/2019 1030   UROBILINOGEN 1.0 04/09/2013 0151   NITRITE POSITIVE (A) 12/24/2019 1030   LEUKOCYTESUR TRACE (A) 12/24/2019 1030   Sepsis Labs Invalid input(s): PROCALCITONIN,  WBC,  LACTICIDVEN Microbiology Recent Results (from the past 240 hour(s))  Blood Culture (routine x 2)     Status: None   Collection Time: 01/05/20  2:29 PM   Specimen: BLOOD LEFT ARM  Result Value Ref Range Status   Specimen Description   Final    BLOOD LEFT ARM Performed at [facility], [place] [address], [place], [place] [address]    Special Requests   Final    BOTTLES DRAWN AEROBIC AND ANAEROBIC Blood Culture adequate volume Performed at [place] [address] [place], [place] [address]    Culture   Final    NO GROWTH 5 DAYS Performed at [facility], [place] [address] [place], [place] [address]    Report Status 01/10/2020 FINAL  Final  Blood Culture (routine x 2)     Status: None   Collection Time: 01/05/20  2:30 PM   Specimen: Left Antecubital; Blood  Result Value Ref Range Status   Specimen Description   Final    LEFT ANTECUBITAL Performed at [place] [address], [place], [place] [address]    Special Requests   Final    BOTTLES DRAWN AEROBIC AND ANAEROBIC Blood Culture adequate volume Performed at [place] [address], [place], [place] [address]    Culture   Final    NO GROWTH  5 DAYS Performed at [facility], [place] [address] [place], [place] [address]    Report Status 01/10/2020 FINAL  Final  SARS CORONAVIRUS 2 (TAT 6-24 HRS) Nasopharyngeal Nasopharyngeal Swab     Status: None   Collection Time: 01/05/20  6:27 PM   Specimen: Nasopharyngeal Swab  Result Value Ref Range Status   SARS Coronavirus 2 NEGATIVE NEGATIVE Final    Comment: (NOTE) SARS-CoV-2 target nucleic acids are NOT DETECTED. The SARS-CoV-2 RNA is generally detectable in upper and lower respiratory specimens during the acute phase of infection. Negative results do not preclude SARS-CoV-2 infection, do not rule out co-infections with other pathogens, and should not be used as the sole basis for treatment or other patient management decisions. Negative results must be combined with clinical observations, patient history, and epidemiological information. The expected result is Negative. Fact Sheet for Patients: SugarRoll.be Fact Sheet for Healthcare Providers: https://www.woods-mathews.com/ This test is not yet approved or cleared by the [place] FDA and  has been authorized for detection and/or diagnosis of SARS-CoV-2 by FDA under an  Emergency Use Authorization (EUA). This EUA will remain  in effect (meaning this test can be used) for the duration of the COVID-19 declaration under Section 56 4(b)(1) of the Act, 21 U.S.C. section 360bbb-3(b)(1), unless the authorization is terminated or revoked sooner. Performed at [facility], [place] [address] [place], [place] [address]   Urine culture     Status: Abnormal   Collection Time: 01/07/20  5:42 AM   Specimen: In/Out Cath Urine  Result Value Ref Range Status   Specimen Description   Final    IN/OUT CATH URINE Performed at [place] [address], [place], [place] [address]    Special Requests   Final    NONE Performed at [facility], [place] [address] [place], [place] [address]    Culture (A)  Final    50,000 COLONIES/mL VANCOMYCIN RESISTANT ENTEROCOCCUS   Report Status 01/09/2020 FINAL  Final   Organism ID, Bacteria VANCOMYCIN RESISTANT ENTEROCOCCUS (A)  Final      Susceptibility   Vancomycin resistant enterococcus - MIC*    AMPICILLIN >=32 RESISTANT Resistant     NITROFURANTOIN 128 RESISTANT Resistant     VANCOMYCIN >=32 RESISTANT Resistant     LINEZOLID 2 SENSITIVE Sensitive     * 50,000 COLONIES/mL VANCOMYCIN RESISTANT ENTEROCOCCUS  SARS CORONAVIRUS 2 (TAT 6-24 HRS) Nasopharyngeal Nasopharyngeal Swab     Status: None   Collection Time: 01/11/20  4:13 PM   Specimen: Nasopharyngeal Swab  Result Value Ref Range Status   SARS Coronavirus 2 NEGATIVE NEGATIVE Final    Comment: (NOTE) SARS-CoV-2 target nucleic acids are NOT DETECTED. The SARS-CoV-2 RNA is generally detectable in upper and lower respiratory specimens during the acute phase of infection. Negative results do not preclude SARS-CoV-2 infection, do not rule out co-infections with other pathogens, and should not be used as the sole basis for treatment or other patient management decisions. Negative results must be combined with clinical  observations, patient history, and epidemiological information. The expected result is Negative. Fact Sheet for Patients: SugarRoll.be Fact Sheet for Healthcare Providers: https://www.woods-mathews.com/ This test is not yet approved or cleared by the [place] FDA and  has been authorized for detection and/or diagnosis of SARS-CoV-2 by FDA under an Emergency Use Authorization (EUA). This EUA will remain  in effect (meaning this test can be used) for the duration of the COVID-19 declaration under Section 56  4(b)(1) of the Act, 21 U.S.C. section 360bbb-3(b)(1), unless the authorization is terminated or revoked sooner. Performed at [facility], [place] [address] [place], [place] [address]    Time spent: 30 min  SIGNED:   Marylu Lund, MD  [facility] 01/12/2020, 1:22 PM  If 7PM-7AM, please contact night-coverage

## 2020-01-12 NOTE — TOC Transition Note (Signed)
Transition of Care [facility]) - CM/SW Discharge Note   Patient Details  Name: Connie Miranda MRN: [id number] Date of Birth: 1933/12/08  Transition [facility]) CM/SW Contact:  Dessa Phi, RN Phone Number: 01/12/2020, 1:56 PM   Clinical Narrative: d/c [facility] rep Irine Seal aware of return-awaiting rm tel# for report. Awaiting call back from dtr [facility]. PTAR for transport.      Final next level of care: Skilled Nursing Facility Barriers to Discharge: Continued Medical Work up   Patient Goals and CMS Choice Patient states their goals for this hospitalization and ongoing recovery are:: go home CMS Medicare.gov Compare Post Acute Care list provided to:: Patient Represenative (must comment)(dtr [facility])    Discharge Placement              Patient chooses bed at: [facility] Patient to be transferred to facility by: [place] Name of family member notified: [name] dtr [phone] Patient and family notified of of transfer: 01/12/20  Discharge Plan and Services   Discharge Planning Services: CM Consult                                 Social Determinants of Health ([place]) Interventions     Readmission Risk Interventions Readmission Risk Prevention Plan 01/06/2020  Transportation Screening Complete  PCP or Specialist Appt within 3-5 Days Complete  HRI or [place] Complete  Social Work Consult for [place] Planning/Counseling [place] Not Complete  Medication Review [employer]) Complete  Some recent data might be hidden

## 2020-01-12 NOTE — Plan of Care (Signed)
Pt ready for DC to [facility] 

## 2020-01-16 ENCOUNTER — Telehealth: Payer: Self-pay | Admitting: *Deleted

## 2020-01-16 NOTE — Telephone Encounter (Signed)
Patient's daughter called to confirm an appointment for follow up on her mother. Gave the appt for 2/4 and confirmed the possibility of IUD placement in the office. Explained that we would call her tomorrow with more details.

## 2020-01-18 ENCOUNTER — Ambulatory Visit: Payer: Medicare Other | Admitting: Family Medicine

## 2020-01-18 ENCOUNTER — Other Ambulatory Visit: Payer: Self-pay | Admitting: Gynecologic Oncology

## 2020-01-19 ENCOUNTER — Telehealth: Payer: Self-pay | Admitting: *Deleted

## 2020-01-19 NOTE — Telephone Encounter (Signed)
Called and left the patient a message to call the office back. Need to found out what insurance the patient gets her prescriptions filled

## 2020-01-20 ENCOUNTER — Telehealth: Payer: Self-pay | Admitting: *Deleted

## 2020-01-20 NOTE — Telephone Encounter (Signed)
Called and left the patient's daughter a message to call the office back regarding insurance   Daughter called back and stated that patient has both medicare and BCBS

## 2020-01-20 NOTE — Telephone Encounter (Signed)
Attempted to call the patient's home to leave a message for her insurance

## 2020-01-24 ENCOUNTER — Telehealth: Payer: Self-pay | Admitting: *Deleted

## 2020-01-24 NOTE — Telephone Encounter (Signed)
Called and spoke with patient's daughter. We both called [employer] [place] and [employer] [place]. Was able to get the patient's BCBS member and group number.   Member number: [id number] Group number: [id number]

## 2020-01-25 ENCOUNTER — Ambulatory Visit: Payer: Medicare Other | Admitting: Gynecologic Oncology

## 2020-01-26 ENCOUNTER — Telehealth: Payer: Self-pay | Admitting: *Deleted

## 2020-01-26 ENCOUNTER — Other Ambulatory Visit: Payer: Self-pay | Admitting: Gynecologic Oncology

## 2020-01-26 DIAGNOSIS — C541 Malignant neoplasm of endometrium: Secondary | ICD-10-CM

## 2020-01-26 MED ORDER — LEVONORGESTREL 20 MCG/24HR IU IUD: 1.0000 | INTRAUTERINE_SYSTEM | Freq: Once | INTRAUTERINE | 0 refills | Status: DC

## 2020-01-26 MED FILL — MIRENA SYSTEM: 20 | 1 days supply | Qty: 1 | Fill #0

## 2020-01-26 NOTE — Telephone Encounter (Signed)
Called and spoke with the pharmacy for the insurance information following   [employer]   ID # [id number] Group # [id number] BIN # [id number] PCN# [id number]

## 2020-02-01 NOTE — Progress Notes (Deleted)
Gynecologic Oncology Return Clinic Visit  02/02/20   Reason for Visit: ***  Treatment History: Oncology History   No history exists.    Interval History: ***  Past Medical/Surgical History: Past Medical History:  Diagnosis Date  . Edema   . Edema of both legs 01/2016  . Glucose-6-phosphate dehydrogenase deficiency    possible diagnosis of G6PD per patient "phosphate 6 deficiency"  . Hypertension   . Osteoarthritis   . Pernicious anemia   . Stroke [facility]) 09/2000   couldn't move her left foot which resolved    Past Surgical History:  Procedure Laterality Date  . BONE MARROW BIOPSY  12/1966   for anemia  . IR PERC CHOLECYSTOSTOMY  04/08/2017  . IR RADIOLOGIST EVAL & MGMT  05/20/2017  . IR SINUS/FIST TUBE CHK-NON GI  06/11/2017  . MULTIPLE TOOTH EXTRACTIONS    . OTHER SURGICAL HISTORY     cyst from panty line removed as child    Family History  Problem Relation Age of Onset  . Diabetes Brother   . Diabetes Brother   . Heart failure Mother 45  . Heart attack Father 62    Social History   Socioeconomic History  . Marital status: Single    Spouse name: Not on file  . Number of children: Not on file  . Years of education: Not on file  . Highest education level: Not on file  Occupational History  . Not on file  Tobacco Use  . Smoking status: Former Smoker    Types: Cigarettes    Quit date: 12/29/1993    Years since quitting: 26.1  . Smokeless tobacco: Never Used  Substance and Sexual Activity  . Alcohol use: No  . Drug use: No  . Sexual activity: Not on file  Other Topics Concern  . Not on file  Social History Narrative   Live alone in house and uses a cane and wheelchair to get around.  She has not been upstairs in over a year.  She does not drive, but she uses a cab or bus to go to grocery store, but usually friends and family bring her things.  Niece and nephew in [place].     Social Determinants of Health   Financial Resource Strain:   . Difficulty of  Paying Living Expenses: Not on file  Food Insecurity:   . Worried About Charity fundraiser in the Last Year: Not on file  . Ran Out of Food in the Last Year: Not on file  Transportation Needs:   . Lack of Transportation (Medical): Not on file  . Lack of Transportation (Non-Medical): Not on file  Physical Activity:   . Days of Exercise per Week: Not on file  . Minutes of Exercise per Session: Not on file  Stress:   . Feeling of Stress : Not on file  Social Connections:   . Frequency of Communication with Friends and Family: Not on file  . Frequency of Social Gatherings with Friends and Family: Not on file  . Attends Religious Services: Not on file  . Active Member of Clubs or Organizations: Not on file  . Attends Archivist Meetings: Not on file  . Marital Status: Not on file    Current Medications:  Current Outpatient Medications:  .  acetaminophen (TYLENOL) 500 MG tablet, Take 500 mg by mouth every 6 (six) hours as needed for moderate pain., Disp: , Rfl:  .  amLODipine (NORVASC) 10 MG tablet, Take 1 tablet (10  mg total) by mouth daily., Disp: 30 tablet, Rfl: 0 .  Calcium Carbonate-Vitamin D (CALCIUM + D PO), Take 1 tablet by mouth daily., Disp: , Rfl:  .  clopidogrel (PLAVIX) 75 MG tablet, Take 1 tablet (75 mg total) by mouth daily., Disp: 30 tablet, Rfl: 0 .  cyanocobalamin (,VITAMIN B-12,) 1000 MCG/ML injection, Inject 1 mL (1,000 mcg total) into the muscle every 30 (thirty) days., Disp: 1 mL, Rfl: 0 .  furosemide (LASIX) 40 MG tablet, Take 3 tablets (120 mg total) by mouth daily., Disp: 90 tablet, Rfl: 11 .  isosorbide mononitrate (IMDUR) 120 MG 24 hr tablet, Take 120 mg by mouth daily., Disp: , Rfl:  .  levonorgestrel (MIRENA) 20 MCG/24HR IUD, 1 Intra Uterine Device (1 each total) by Intrauterine route once for 1 dose. To be placed in the office, Disp: 1 each, Rfl: 0 .  mineral oil-hydrophilic petrolatum (AQUAPHOR) ointment, Apply 1 application topically daily. To  lower extremities (Patient not taking: Reported on 03/24/2019), Disp: 420 g, Rfl: 0 .  Multiple Vitamin (MULTIVITAMIN WITH MINERALS) TABS, Take 1 tablet by mouth daily., Disp: 30 tablet, Rfl: 0 .  polyethylene glycol (MIRALAX / GLYCOLAX) 17 g packet, Take 17 g by mouth daily. (Patient taking differently: Take 17 g by mouth daily as needed for mild constipation. ), Disp: 14 each, Rfl: 0 .  senna-docusate (SENOKOT-S) 8.6-50 MG tablet, Take 1 tablet by mouth at bedtime., Disp: 30 tablet, Rfl: 0  Review of Symptoms: Complete 10-system review is negative except ***as above in Interval History.  Physical Exam: There were no vitals taken for this visit. General: ***Alert, oriented, no acute distress. HEENT: ***Posterior oropharynx clear, sclera anicteric. Chest: ***Clear to auscultation bilaterally.  ***Port site clean. Cardiovascular: ***Regular rate and rhythm, no murmurs. Abdomen: ***Obese, soft, nontender.  Normoactive bowel sounds.  No masses or hepatosplenomegaly appreciated.  ***Well-healed scar. Extremities: ***Grossly normal range of motion.  Warm, well perfused.  No edema bilaterally. Skin: ***No rashes or lesions noted. Lymphatics: ***No cervical, supraclavicular, or inguinal adenopathy. GU: Normal appearing external genitalia without erythema, excoriation, or lesions.  Speculum exam reveals ***.  Bimanual exam reveals ***.  ***Rectovaginal exam  confirms ___.  Laboratory & Radiologic Studies: ***  Assessment & Plan: Connie Miranda is a 86 y.o. woman with Stage *** who presents for ***.  Jeral Pinch, MD  Division of Gynecologic Oncology  Department of Obstetrics and Gynecology  [facility]

## 2020-02-02 ENCOUNTER — Other Ambulatory Visit: Payer: Self-pay

## 2020-02-02 ENCOUNTER — Inpatient Hospital Stay: Payer: Medicare Other | Admitting: Gynecologic Oncology

## 2020-02-02 ENCOUNTER — Telehealth: Payer: Self-pay | Admitting: Gynecologic Oncology

## 2020-02-02 NOTE — Progress Notes (Deleted)
Gynecologic Oncology Return Clinic Visit  02/08/20   Reason for Visit: ***  Treatment History: Oncology History   No history exists.    Interval History: ***  Past Medical/Surgical History: Past Medical History:  Diagnosis Date  . Edema   . Edema of both legs 01/2016  . Glucose-6-phosphate dehydrogenase deficiency    possible diagnosis of G6PD per patient "phosphate 6 deficiency"  . Hypertension   . Osteoarthritis   . Pernicious anemia   . Stroke [facility]) 09/2000   couldn't move her left foot which resolved    Past Surgical History:  Procedure Laterality Date  . BONE MARROW BIOPSY  12/1966   for anemia  . IR PERC CHOLECYSTOSTOMY  04/08/2017  . IR RADIOLOGIST EVAL & MGMT  05/20/2017  . IR SINUS/FIST TUBE CHK-NON GI  06/11/2017  . MULTIPLE TOOTH EXTRACTIONS    . OTHER SURGICAL HISTORY     cyst from panty line removed as child    Family History  Problem Relation Age of Onset  . Diabetes Brother   . Diabetes Brother   . Heart failure Mother 52  . Heart attack Father 57    Social History   Socioeconomic History  . Marital status: Single    Spouse name: Not on file  . Number of children: Not on file  . Years of education: Not on file  . Highest education level: Not on file  Occupational History  . Not on file  Tobacco Use  . Smoking status: Former Smoker    Types: Cigarettes    Quit date: 12/29/1993    Years since quitting: 26.1  . Smokeless tobacco: Never Used  Substance and Sexual Activity  . Alcohol use: No  . Drug use: No  . Sexual activity: Not on file  Other Topics Concern  . Not on file  Social History Narrative   Live alone in house and uses a cane and wheelchair to get around.  She has not been upstairs in over a year.  She does not drive, but she uses a cab or bus to go to grocery store, but usually friends and family bring her things.  Niece and nephew in [place].     Social Determinants of Health   Financial Resource Strain:   . Difficulty of  Paying Living Expenses: Not on file  Food Insecurity:   . Worried About Charity fundraiser in the Last Year: Not on file  . Ran Out of Food in the Last Year: Not on file  Transportation Needs:   . Lack of Transportation (Medical): Not on file  . Lack of Transportation (Non-Medical): Not on file  Physical Activity:   . Days of Exercise per Week: Not on file  . Minutes of Exercise per Session: Not on file  Stress:   . Feeling of Stress : Not on file  Social Connections:   . Frequency of Communication with Friends and Family: Not on file  . Frequency of Social Gatherings with Friends and Family: Not on file  . Attends Religious Services: Not on file  . Active Member of Clubs or Organizations: Not on file  . Attends Archivist Meetings: Not on file  . Marital Status: Not on file    Current Medications:  Current Outpatient Medications:  .  acetaminophen (TYLENOL) 500 MG tablet, Take 500 mg by mouth every 6 (six) hours as needed for moderate pain., Disp: , Rfl:  .  amLODipine (NORVASC) 10 MG tablet, Take 1 tablet (10  mg total) by mouth daily., Disp: 30 tablet, Rfl: 0 .  Calcium Carbonate-Vitamin D (CALCIUM + D PO), Take 1 tablet by mouth daily., Disp: , Rfl:  .  clopidogrel (PLAVIX) 75 MG tablet, Take 1 tablet (75 mg total) by mouth daily., Disp: 30 tablet, Rfl: 0 .  cyanocobalamin (,VITAMIN B-12,) 1000 MCG/ML injection, Inject 1 mL (1,000 mcg total) into the muscle every 30 (thirty) days., Disp: 1 mL, Rfl: 0 .  furosemide (LASIX) 40 MG tablet, Take 3 tablets (120 mg total) by mouth daily., Disp: 90 tablet, Rfl: 11 .  isosorbide mononitrate (IMDUR) 120 MG 24 hr tablet, Take 120 mg by mouth daily., Disp: , Rfl:  .  levonorgestrel (MIRENA) 20 MCG/24HR IUD, 1 Intra Uterine Device (1 each total) by Intrauterine route once for 1 dose. To be placed in the office, Disp: 1 each, Rfl: 0 .  mineral oil-hydrophilic petrolatum (AQUAPHOR) ointment, Apply 1 application topically daily. To  lower extremities (Patient not taking: Reported on 03/24/2019), Disp: 420 g, Rfl: 0 .  Multiple Vitamin (MULTIVITAMIN WITH MINERALS) TABS, Take 1 tablet by mouth daily., Disp: 30 tablet, Rfl: 0 .  polyethylene glycol (MIRALAX / GLYCOLAX) 17 g packet, Take 17 g by mouth daily. (Patient taking differently: Take 17 g by mouth daily as needed for mild constipation. ), Disp: 14 each, Rfl: 0 .  senna-docusate (SENOKOT-S) 8.6-50 MG tablet, Take 1 tablet by mouth at bedtime., Disp: 30 tablet, Rfl: 0  Review of Symptoms: Complete 10-system review is negative except ***as above in Interval History.  Physical Exam: There were no vitals taken for this visit. General: ***Alert, oriented, no acute distress. HEENT: ***Posterior oropharynx clear, sclera anicteric. Chest: ***Clear to auscultation bilaterally.  ***Port site clean. Cardiovascular: ***Regular rate and rhythm, no murmurs. Abdomen: ***Obese, soft, nontender.  Normoactive bowel sounds.  No masses or hepatosplenomegaly appreciated.  ***Well-healed scar. Extremities: ***Grossly normal range of motion.  Warm, well perfused.  No edema bilaterally. Skin: ***No rashes or lesions noted. Lymphatics: ***No cervical, supraclavicular, or inguinal adenopathy. GU: Normal appearing external genitalia without erythema, excoriation, or lesions.  Speculum exam reveals ***.  Bimanual exam reveals ***.  ***Rectovaginal exam  confirms ___.  Laboratory & Radiologic Studies: ***  Assessment & Plan: Connie Miranda is a 86 y.o. woman with Stage *** who presents for ***.  Jeral Pinch, MD  Division of Gynecologic Oncology  Department of Obstetrics and Gynecology  [facility]

## 2020-02-02 NOTE — Telephone Encounter (Signed)
Patient presented today for a visit for discussion and likely insertion of a Mirena IUD for treatment of her Grade 2 endometrial cancer. Given bedridden status and no way to get her on the lift safely to use our [facility] lift in clinic, plan was to reschedule the patient for next week for Mirena IUD insertion and the daughter was given the lift that is compatible with our machine.  I spent a long time discussing the patient's cancer diagnosis and typical treatment options again with her daughter.  We discussed surgery, primary radiation, and hormonal therapy.  Given her performance status, overall deconditioning, and medical comorbidities, she is not a surgical candidate.  I have discussed her case with my partner who agrees.  I also spoke with our radiation oncologist and given her significantly limited mobility, she is not a candidate for radiation therapy either.  In terms of hormonal therapy, oral progesterone has some side effects, notably including edema and weight gain, which we would want to avoid in her if at all possible.  My recommendation was that we move forward with a Mirena IUD when I saw her in the hospital.  It is still unclear to me whether her initial presentation with a septic picture was secondary to endometritis or uterine infection due to an IUD significantly overdue for removal.  She is now status post removal of that IUD at the time of endometrial sampling.  Subsequent to her hospitalization, she was rehospitalized again with a septic picture suspected to be secondary to a VRE urinary tract infection.  She was discharged to a skilled nursing facility on the 14th.  Plan at this time is for her discharge from the skilled nursing facility on Saturday.  The daughter voices feeling overwhelmed secondary to being her mother's advocate but also frustrated that her mother does not seem to want to discuss her endometrial cancer diagnosis nor many of the other medical problems that she has.  Our plan  is to move forward with Mirena IUD insertion in clinic next Wednesday morning at 1030: I have recommended to the daughter that she attempt to discuss with her mom both the diagnosis as well as wishes for end-of-life and goals of care.  I think it would be very reasonable now or in the near future to enlist hospice services.  Jeral Pinch MD Gynecologic Oncology

## 2020-02-03 ENCOUNTER — Telehealth: Payer: Self-pay | Admitting: *Deleted

## 2020-02-03 NOTE — Telephone Encounter (Signed)
Freddie Breech from [facility] called, he stated that he has talked with the daughter. The daughter and family would like to proceed with hospice. He was calling for an order for hospice. Explained that I didn't any mention of hospice and that our NP didn't know of said discuss. Explained that I would give the message to Dr Berline Lopes and call him back. His number [phone]

## 2020-02-06 ENCOUNTER — Telehealth: Payer: Self-pay | Admitting: Oncology

## 2020-02-06 NOTE — Telephone Encounter (Signed)
Called Connie Miranda (daughter) regarding hospice.  She said they are coming to see Connie Miranda today at 5:30 for intake. Also reviewed how to position the pad in Connie Miranda's wheelchair for the hoyer lift

## 2020-02-08 ENCOUNTER — Other Ambulatory Visit: Payer: Self-pay

## 2020-02-08 ENCOUNTER — Inpatient Hospital Stay: Attending: Gynecologic Oncology | Admitting: Gynecologic Oncology

## 2020-02-08 ENCOUNTER — Encounter: Payer: Self-pay | Admitting: Gynecologic Oncology

## 2020-02-08 VITALS — BP 108/63 | HR 87 | Temp 98.2°F | Resp 19 | Wt 226.0 lb

## 2020-02-08 DIAGNOSIS — C541 Malignant neoplasm of endometrium: Secondary | ICD-10-CM

## 2020-02-08 DIAGNOSIS — L03116 Cellulitis of left lower limb: Secondary | ICD-10-CM | POA: Diagnosis not present

## 2020-02-08 DIAGNOSIS — Z30430 Encounter for insertion of intrauterine contraceptive device: Secondary | ICD-10-CM | POA: Diagnosis not present

## 2020-02-08 NOTE — Patient Instructions (Signed)
I will call in a month to check in and see how the bleeding is. Please don't hesitate to call the clinic if anything comes up before that at [phone].

## 2020-02-08 NOTE — Progress Notes (Signed)
Gynecologic Oncology Return Clinic Visit  02/08/20   Reason for Visit: Mirena IUD placement  Treatment History: Oncology History  Endometrial carcinoma ([place])  12/24/2019 Imaging   CT A/P: IMPRESSION: 1. The previously measured right vaginal fluid collection at 2.8 x 1 point 0 cm is not clearly demonstrated on today's study. There does appear to be a fluid collection in the left labia majora measuring 4.3 x 1.7 cm, similar to prior study. No features on today's study to suggest necrotizing fasciitis. 2. Marked apparent thickening of the endometrium. Neoplasm not excluded. Pelvic ultrasound recommended to further evaluate. 3. Collapsed gallbladder. Gallstones documented on previous cholecystostomy tube injection are not visualized on today's CT. 4.  Aortic Atherosclerois (ICD10-170.0)   12/26/2019 Imaging   Pelvic ultrasound: 1. Thickened endometrial stripe measuring up to 19.1 mm in thickness. Endometrial thickness is considered abnormal for an asymptomatic post-menopausal female. Endometrial sampling should be considered to exclude carcinoma. 2. Nonvisualization of the ovaries. No adnexal mass or abnormal free fluid within the pelvis. 3. No other acute abnormality.   01/02/2020 Initial Biopsy   D&C - Grade 2 endometrioid adenocarcinoma   01/02/2020 Initial Diagnosis   Endometrial carcinoma (HCC)    Interval History: Since I saw the patient last week, her daughter notes that she is now moved home and they have enlisted hospice services.  Over the last few days at home, the daughter has noticed some light vaginal spotting which she describes as brown and dark.  Mother continues to have significant hip pain but has denied any abdominal or pelvic pain.  Past Medical/Surgical History: Past Medical History:  Diagnosis Date  . Edema   . Edema of both legs 01/2016  . Glucose-6-phosphate dehydrogenase deficiency    possible diagnosis of G6PD per patient "phosphate 6 deficiency"  .  Hypertension   . Osteoarthritis   . Pernicious anemia   . Stroke [facility] 09/2000   couldn't move her left foot which resolved    Past Surgical History:  Procedure Laterality Date  . BONE MARROW BIOPSY  12/1966   for anemia  . IR PERC CHOLECYSTOSTOMY  04/08/2017  . IR RADIOLOGIST EVAL & MGMT  05/20/2017  . IR SINUS/FIST TUBE CHK-NON GI  06/11/2017  . MULTIPLE TOOTH EXTRACTIONS    . OTHER SURGICAL HISTORY     cyst from panty line removed as child    Family History  Problem Relation Age of Onset  . Diabetes Brother   . Diabetes Brother   . Heart failure Mother 50  . Heart attack Father 81    Social History   Socioeconomic History  . Marital status: Single    Spouse name: Not on file  . Number of children: Not on file  . Years of education: Not on file  . Highest education level: Not on file  Occupational History  . Not on file  Tobacco Use  . Smoking status: Former Smoker    Types: Cigarettes    Quit date: 12/29/1993    Years since quitting: 26.1  . Smokeless tobacco: Never Used  Substance and Sexual Activity  . Alcohol use: No  . Drug use: No  . Sexual activity: Not Currently  Other Topics Concern  . Not on file  Social History Narrative   Live alone in house and uses a cane and wheelchair to get around.  She has not been upstairs in over a year.  She does not drive, but she uses a cab or bus to go to grocery store, but  usually friends and family bring her things.  Niece and nephew in [place].     Social Determinants of Health   Financial Resource Strain:   . Difficulty of Paying Living Expenses: Not on file  Food Insecurity:   . Worried About Charity fundraiser in the Last Year: Not on file  . Ran Out of Food in the Last Year: Not on file  Transportation Needs:   . Lack of Transportation (Medical): Not on file  . Lack of Transportation (Non-Medical): Not on file  Physical Activity:   . Days of Exercise per Week: Not on file  . Minutes of Exercise per Session:  Not on file  Stress:   . Feeling of Stress : Not on file  Social Connections:   . Frequency of Communication with Friends and Family: Not on file  . Frequency of Social Gatherings with Friends and Family: Not on file  . Attends Religious Services: Not on file  . Active Member of Clubs or Organizations: Not on file  . Attends Archivist Meetings: Not on file  . Marital Status: Not on file    Current Medications:  Current Outpatient Medications:  .  acetaminophen (TYLENOL) 500 MG tablet, Take 500 mg by mouth every 6 (six) hours as needed for moderate pain., Disp: , Rfl:  .  amLODipine (NORVASC) 10 MG tablet, Take 1 tablet (10 mg total) by mouth daily., Disp: 30 tablet, Rfl: 0 .  Calcium Carbonate-Vitamin D (CALCIUM + D PO), Take 1 tablet by mouth daily., Disp: , Rfl:  .  clopidogrel (PLAVIX) 75 MG tablet, Take 1 tablet (75 mg total) by mouth daily., Disp: 30 tablet, Rfl: 0 .  cyanocobalamin (,VITAMIN B-12,) 1000 MCG/ML injection, Inject 1 mL (1,000 mcg total) into the muscle every 30 (thirty) days., Disp: 1 mL, Rfl: 0 .  furosemide (LASIX) 40 MG tablet, Take 3 tablets (120 mg total) by mouth daily., Disp: 90 tablet, Rfl: 11 .  isosorbide mononitrate (IMDUR) 120 MG 24 hr tablet, Take 120 mg by mouth daily., Disp: , Rfl:  .  levonorgestrel (MIRENA) 20 MCG/24HR IUD, 1 Intra Uterine Device (1 each total) by Intrauterine route once for 1 dose. To be placed in the office, Disp: 1 each, Rfl: 0 .  mineral oil-hydrophilic petrolatum (AQUAPHOR) ointment, Apply 1 application topically daily. To lower extremities, Disp: 420 g, Rfl: 0 .  Multiple Vitamin (MULTIVITAMIN WITH MINERALS) TABS, Take 1 tablet by mouth daily., Disp: 30 tablet, Rfl: 0 .  polyethylene glycol (MIRALAX / GLYCOLAX) 17 g packet, Take 17 g by mouth daily. (Patient taking differently: Take 17 g by mouth daily as needed for mild constipation. ), Disp: 14 each, Rfl: 0  Review of Symptoms: Positive for vaginal bleeding, hip  pain.  Physical Exam: BP 108/63 (BP Location: Left Arm, Patient Position: Sitting)   Pulse 87   Temp 98.2 F (36.8 C) (Temporal)   Resp 19   Wt 226 lb (102.5 kg)   SpO2 99%   BMI 38.79 kg/m  General: Alert, no acute distress. Chest: Unlabored breathing on room air. GU: Normal appearing external genitalia without erythema, excoriation, or lesions.  Some stool noted on the vulva, cleaned.  Speculum exam reveals atrophic vaginal mucosa with anterior cervix, located behind the pubic symphysis, flush with the vaginal apex.  Small amount of older appearing blood in the vaginal vault..  Bimanual exam reveals small cervix flush with the vaginal apex, given the patient's discomfort with the exam and body habitus,  size of the uterus difficult to determine.  Rectovaginal exam deferred.  Mirena IUD insertion  The procedure was explained to both the patient and her daughter.  The speculum was placed multiple times.  After attempting to visualize the cervix and performing bimanual exam, ultimately the cervix was somewhat visualized.  And the cervix was prepped with Betadine x3.  The uterus sounded to 8 cm with an endometrial [place].  Mirena IUD, Lot #[id number], exp date 03/2022 was then placed to the uterine fundus, deployed, and the inserter was removed. The strings were cut at 2 cm. The patient tolerated procedure.   The procedure required the assistance of 5 clinic staff member secondary to patient's BMI as well as her immobility.  Additionally, given the location of her cervix and immobility, the procedure itself was more complex than typical IUD placement.  Assessment & Plan: Connie Miranda is a 86 y.o. woman with clinical stage I endometrial cancer and significant comorbidities who presents for Mirena IUD placement.  I have had multiple long conversations with the patient's daughter in the past as well as the patient about how she is not a candidate for either standard of care treatment with  surgery or primary radiation, given her comorbidities, immobility and overall health status.  We had discussed the use of hormonal therapy both to decrease the risk of bleeding and also potentially treat the endometrial cancer.  She has no evidence of uterine infection on exam today and I felt that the benefits outweigh the risks of IUD insertion.  Since our last discussion, the daughter has enlisted the help of hospice.  We will plan to have a phone conversation in 1 month to see how the patient is doing and how her bleeding is.  35 minutes of total time was spent for this patient encounter, including preparation, face-to-face counseling with the patient and coordination of care, and documentation of the encounter.  Jeral Pinch, MD  Division of Gynecologic Oncology  Department of Obstetrics and Gynecology  [facility]

## 2020-02-24 NOTE — Progress Notes (Signed)
This encounter was created in error - please disregard.

## 2020-03-04 NOTE — Progress Notes (Signed)
Gynecologic Oncology Telehealth Consult Note: Gyn-Onc  I connected with [place] on 03/07/20 at  3:30 PM EST by telephone and verified that I am speaking with the correct person using two identifiers.  I discussed the limitations, risks, security and privacy concerns of performing an evaluation and management service by telemedicine and the availability of in-person appointments. I also discussed with the patient that there may be a patient responsible charge related to this service. The patient expressed understanding and agreed to proceed.  Other persons participating in the visit and their role in the encounter: daughter, Ronney Lion.  Patient's location: home Provider's location: [facility]  Reason for Visit: follow-up  Treatment History: Oncology History  Endometrial carcinoma ([place])  12/24/2019 Imaging   CT A/P: IMPRESSION: 1. The previously measured right vaginal fluid collection at 2.8 x 1 point 0 cm is not clearly demonstrated on today's study. There does appear to be a fluid collection in the left labia majora measuring 4.3 x 1.7 cm, similar to prior study. No features on today's study to suggest necrotizing fasciitis. 2. Marked apparent thickening of the endometrium. Neoplasm not excluded. Pelvic ultrasound recommended to further evaluate. 3. Collapsed gallbladder. Gallstones documented on previous cholecystostomy tube injection are not visualized on today's CT. 4.  Aortic Atherosclerois (ICD10-170.0)   12/26/2019 Imaging   Pelvic ultrasound: 1. Thickened endometrial stripe measuring up to 19.1 mm in thickness. Endometrial thickness is considered abnormal for an asymptomatic post-menopausal female. Endometrial sampling should be considered to exclude carcinoma. 2. Nonvisualization of the ovaries. No adnexal mass or abnormal free fluid within the pelvis. 3. No other acute abnormality.   01/02/2020 Initial Biopsy   D&C - Grade 2 endometrioid adenocarcinoma   01/02/2020  Initial Diagnosis   Endometrial carcinoma ([place])    Interval History: The patient's daughter reports that overall she has been doing well.  She had minimal gelatinous discharge the first day after her IUD was placed.  Since then, she has had light spotting with some periods of increased bleeding noted when they move her mom.  She denies any passage of clots.  Her mother has not complained of any pelvic pain or cramping.  Unfortunately, it sounds like they were turned down for hospice services with the agency they were set up to have an intake with.  The patient is currently working with occupational therapy, which she finishes next Tuesday, and physical therapy which ends in May.  The social worker with the home health agency recommended some nonprofit hospice agency that the daughter is going to reach out to.  Overall, her mother's appetite has been up and down.  While her mother often makes comments about not being able to eat, but Ronney Lion thinks that it is often her choice not to eat a lot.  She is drinking 1 or 2 ensures most days.  Past Medical/Surgical History: Past Medical History:  Diagnosis Date  . Edema   . Edema of both legs 01/2016  . Glucose-6-phosphate dehydrogenase deficiency    possible diagnosis of G6PD per patient "phosphate 6 deficiency"  . Hypertension   . Osteoarthritis   . Pernicious anemia   . Stroke [facility]) 09/2000   couldn't move her left foot which resolved    Past Surgical History:  Procedure Laterality Date  . BONE MARROW BIOPSY  12/1966   for anemia  . IR PERC CHOLECYSTOSTOMY  04/08/2017  . IR RADIOLOGIST EVAL & MGMT  05/20/2017  . IR SINUS/FIST TUBE CHK-NON GI  06/11/2017  . MULTIPLE TOOTH  EXTRACTIONS    . OTHER SURGICAL HISTORY     cyst from panty line removed as child    Family History  Problem Relation Age of Onset  . Diabetes Brother   . Diabetes Brother   . Heart failure Mother 60  . Heart attack Father 80    Social History   Socioeconomic History   . Marital status: Single    Spouse name: Not on file  . Number of children: Not on file  . Years of education: Not on file  . Highest education level: Not on file  Occupational History  . Not on file  Tobacco Use  . Smoking status: Former Smoker    Types: Cigarettes    Quit date: 12/29/1993    Years since quitting: 26.2  . Smokeless tobacco: Never Used  Substance and Sexual Activity  . Alcohol use: No  . Drug use: No  . Sexual activity: Not Currently  Other Topics Concern  . Not on file  Social History Narrative   Live alone in house and uses a cane and wheelchair to get around.  She has not been upstairs in over a year.  She does not drive, but she uses a cab or bus to go to grocery store, but usually friends and family bring her things.  Niece and nephew in [place].     Social Determinants of Health   Financial Resource Strain:   . Difficulty of Paying Living Expenses: Not on file  Food Insecurity:   . Worried About Charity fundraiser in the Last Year: Not on file  . Ran Out of Food in the Last Year: Not on file  Transportation Needs:   . Lack of Transportation (Medical): Not on file  . Lack of Transportation (Non-Medical): Not on file  Physical Activity:   . Days of Exercise per Week: Not on file  . Minutes of Exercise per Session: Not on file  Stress:   . Feeling of Stress : Not on file  Social Connections:   . Frequency of Communication with Friends and Family: Not on file  . Frequency of Social Gatherings with Friends and Family: Not on file  . Attends Religious Services: Not on file  . Active Member of Clubs or Organizations: Not on file  . Attends Archivist Meetings: Not on file  . Marital Status: Not on file    Current Medications:  Current Outpatient Medications:  .  acetaminophen (TYLENOL) 500 MG tablet, Take 500 mg by mouth every 6 (six) hours as needed for moderate pain., Disp: , Rfl:  .  amLODipine (NORVASC) 10 MG tablet, Take 1 tablet (10  mg total) by mouth daily., Disp: 30 tablet, Rfl: 0 .  Calcium Carbonate-Vitamin D (CALCIUM + D PO), Take 1 tablet by mouth daily., Disp: , Rfl:  .  clopidogrel (PLAVIX) 75 MG tablet, Take 1 tablet (75 mg total) by mouth daily., Disp: 30 tablet, Rfl: 0 .  cyanocobalamin (,VITAMIN B-12,) 1000 MCG/ML injection, Inject 1 mL (1,000 mcg total) into the muscle every 30 (thirty) days., Disp: 1 mL, Rfl: 0 .  furosemide (LASIX) 40 MG tablet, Take 3 tablets (120 mg total) by mouth daily., Disp: 90 tablet, Rfl: 11 .  isosorbide mononitrate (IMDUR) 120 MG 24 hr tablet, Take 120 mg by mouth daily., Disp: , Rfl:  .  levonorgestrel (MIRENA) 20 MCG/24HR IUD, 1 Intra Uterine Device (1 each total) by Intrauterine route once for 1 dose. To be placed in the office,  Disp: 1 each, Rfl: 0 .  mineral oil-hydrophilic petrolatum (AQUAPHOR) ointment, Apply 1 application topically daily. To lower extremities, Disp: 420 g, Rfl: 0 .  Multiple Vitamin (MULTIVITAMIN WITH MINERALS) TABS, Take 1 tablet by mouth daily., Disp: 30 tablet, Rfl: 0 .  polyethylene glycol (MIRALAX / GLYCOLAX) 17 g packet, Take 17 g by mouth daily. (Patient taking differently: Take 17 g by mouth daily as needed for mild constipation. ), Disp: 14 each, Rfl: 0  Physical Exam: There were no vitals taken for this visit. Not performed due to visit limitations.  Laboratory & Radiologic Studies: None new  Assessment & Plan: Connie Miranda is a 86 y.o. woman with multiple medical comorbidities including being bedbound who has on staged uterine cancer currently being treated with progesterone via an IUD.  Overall it sounds like the patient has done well since I saw her last.  She has had minimal symptoms related to her uterine cancer.  Her daughter is encouraged by her work with PT and OT and is continuing to pursue avenues to get the patient into hospice.  I recommended that we have another phone visit in 2-3 months and reviewed the signs and symptoms that  should prompt a phone call prior to that time.  The daughter voiced understanding of the use of the IUD to treat her mother's cancer since she is not a candidate for standard of care therapy (surgery) or radiation treatment.  I discussed the assessment and treatment plan with the patient. The patient was provided with an opportunity to ask questions and all were answered. The patient agreed with the plan and demonstrated an understanding of the instructions.   The patient was advised to call back or see an in-person evaluation if the symptoms worsen or if the condition fails to improve as anticipated.   15 minutes of total time was spent for this patient encounter, including preparation, face-to-face counseling with the patient and coordination of care, and documentation of the encounter.   Jeral Pinch, MD  Division of Gynecologic Oncology  Department of Obstetrics and Gynecology  [facility]

## 2020-03-07 ENCOUNTER — Inpatient Hospital Stay: Attending: Gynecologic Oncology | Admitting: Gynecologic Oncology

## 2020-03-07 ENCOUNTER — Telehealth: Payer: Self-pay | Admitting: Gynecologic Oncology

## 2020-03-07 ENCOUNTER — Encounter: Payer: Self-pay | Admitting: Gynecologic Oncology

## 2020-03-07 DIAGNOSIS — C541 Malignant neoplasm of endometrium: Secondary | ICD-10-CM

## 2020-03-07 NOTE — Telephone Encounter (Signed)
Scheduled to have a phone visit with the patient and her daughter today.  Called the daughter's home phone (left message) and her cell phone twice (rising but unable to leave a message as the voice mailbox is full).  Also called the patient's home phone number which rang with no answer and no way to leave a voicemail.  Jeral Pinch MD Gynecologic Oncology

## 2020-04-11 ENCOUNTER — Telehealth: Payer: Self-pay

## 2020-04-11 NOTE — Telephone Encounter (Signed)
Patient's daughter Ronney Lion called concerning pt IUD.  Caregiver has noted  light spotting with jelly like discharge.  IUD placed in February per daughter.  NP advises daughter to tell caregiver to watch for IUD when changing pt and heavy bleeding.  If occurs pt will need to come in for eval.  Daughter voiced understanding of above.

## 2020-05-29 ENCOUNTER — Telehealth: Payer: Self-pay | Admitting: *Deleted

## 2020-05-29 NOTE — Telephone Encounter (Signed)
Called the patient's daughter and moved the appt from 6/8 to 6/7

## 2020-05-30 NOTE — Progress Notes (Signed)
Gynecologic Oncology Telehealth Consult Note: Gyn-Onc  I connected with Connie Miranda on 06/01/20 at  9:00 AM EDT by telephone and verified that I am speaking with the correct person using two identifiers.  I discussed the limitations, risks, security and privacy concerns of performing an evaluation and management service by telemedicine and the availability of in-person appointments. I also discussed with the patient that there may be a patient responsible charge related to this service. The patient expressed understanding and agreed to proceed.  Other persons participating in the visit and their role in the encounter: ddaughter, Connie Miranda.  Patient's location: home Provider's location: [facility]  Reason for Visit: follow-up in the setting of clinically stage I uterine cancer, undergoing hormonal treatment  Treatment History: Oncology History  Endometrial carcinoma ([place])  12/24/2019 Imaging   CT A/P: IMPRESSION: 1. The previously measured right vaginal fluid collection at 2.8 x 1 point 0 cm is not clearly demonstrated on today's study. There does appear to be a fluid collection in the left labia majora measuring 4.3 x 1.7 cm, similar to prior study. No features on today's study to suggest necrotizing fasciitis. 2. Marked apparent thickening of the endometrium. Neoplasm not excluded. Pelvic ultrasound recommended to further evaluate. 3. Collapsed gallbladder. Gallstones documented on previous cholecystostomy tube injection are not visualized on today's CT. 4.  Aortic Atherosclerois (ICD10-170.0)   12/26/2019 Imaging   Pelvic ultrasound: 1. Thickened endometrial stripe measuring up to 19.1 mm in thickness. Endometrial thickness is considered abnormal for an asymptomatic post-menopausal female. Endometrial sampling should be considered to exclude carcinoma. 2. Nonvisualization of the ovaries. No adnexal mass or abnormal free fluid within the pelvis. 3. No other acute abnormality.    01/02/2020 Initial Biopsy   D&C - Grade 2 endometrioid adenocarcinoma   01/02/2020 Initial Diagnosis   Endometrial carcinoma ([place])   02/08/2020 Treatment Plan Change   Mirena IUD placed     Interval History: I had a conversation with the patient's daughter,Connie Miranda, with her mother sitting close by.  Connie Miranda reports that her mother has not had any abdominal or pelvic pain/cramping.  Initially after the IUD was placed, she had clear gelatinous discharge.  This subsequently stopped.  She has had minimal vaginal bleeding/spotting that the daughter describes as either smudge or a slow trickle if the patient is being moved.  Denies any odor or passage of tissue/clots.  Her mother's appetite has been intermittently better.  Connie Miranda has been there since last week and states that her mother eats 3 meals most days.  She is also encouraging her to drink plenty of water and protein shakes.  Past Medical/Surgical History: Past Medical History:  Diagnosis Date  . Edema   . Edema of both legs 01/2016  . Glucose-6-phosphate dehydrogenase deficiency    possible diagnosis of G6PD per patient "phosphate 6 deficiency"  . Hypertension   . Osteoarthritis   . Pernicious anemia   . Stroke [facility]) 09/2000   couldn't move her left foot which resolved    Past Surgical History:  Procedure Laterality Date  . BONE MARROW BIOPSY  12/1966   for anemia  . IR PERC CHOLECYSTOSTOMY  04/08/2017  . IR RADIOLOGIST EVAL & MGMT  05/20/2017  . IR SINUS/FIST TUBE CHK-NON GI  06/11/2017  . MULTIPLE TOOTH EXTRACTIONS    . OTHER SURGICAL HISTORY     cyst from panty line removed as child    Family History  Problem Relation Age of Onset  . Diabetes Brother   . Diabetes Brother   .  Heart failure Mother 34  . Heart attack Father 42    Social History   Socioeconomic History  . Marital status: Single    Spouse name: Not on file  . Number of children: Not on file  . Years of education: Not on file  . Highest education level:  Not on file  Occupational History  . Not on file  Tobacco Use  . Smoking status: Former Smoker    Types: Cigarettes    Quit date: 12/29/1993    Years since quitting: 26.4  . Smokeless tobacco: Never Used  Substance and Sexual Activity  . Alcohol use: No  . Drug use: No  . Sexual activity: Not Currently  Other Topics Concern  . Not on file  Social History Narrative   Live alone in house and uses a cane and wheelchair to get around.  She has not been upstairs in over a year.  She does not drive, but she uses a cab or bus to go to grocery store, but usually friends and family bring her things.  Niece and nephew in [place].     Social Determinants of Health   Financial Resource Strain:   . Difficulty of Paying Living Expenses:   Food Insecurity:   . Worried About Charity fundraiser in the Last Year:   . Arboriculturist in the Last Year:   Transportation Needs:   . Film/video editor (Medical):   Marland Kitchen Lack of Transportation (Non-Medical):   Physical Activity:   . Days of Exercise per Week:   . Minutes of Exercise per Session:   Stress:   . Feeling of Stress :   Social Connections:   . Frequency of Communication with Friends and Family:   . Frequency of Social Gatherings with Friends and Family:   . Attends Religious Services:   . Active Member of Clubs or Organizations:   . Attends Archivist Meetings:   Marland Kitchen Marital Status:     Current Medications:  Current Outpatient Medications:  .  acetaminophen (TYLENOL) 500 MG tablet, Take 500 mg by mouth every 6 (six) hours as needed for moderate pain., Disp: , Rfl:  .  amLODipine (NORVASC) 10 MG tablet, Take 1 tablet (10 mg total) by mouth daily., Disp: 30 tablet, Rfl: 0 .  Calcium Carbonate-Vitamin D (CALCIUM + D PO), Take 1 tablet by mouth daily., Disp: , Rfl:  .  clopidogrel (PLAVIX) 75 MG tablet, Take 1 tablet (75 mg total) by mouth daily., Disp: 30 tablet, Rfl: 0 .  cyanocobalamin (,VITAMIN B-12,) 1000 MCG/ML  injection, Inject 1 mL (1,000 mcg total) into the muscle every 30 (thirty) days., Disp: 1 mL, Rfl: 0 .  furosemide (LASIX) 40 MG tablet, Take 3 tablets (120 mg total) by mouth daily., Disp: 90 tablet, Rfl: 11 .  isosorbide mononitrate (IMDUR) 120 MG 24 hr tablet, Take 120 mg by mouth daily., Disp: , Rfl:  .  levonorgestrel (MIRENA) 20 MCG/24HR IUD, 1 Intra Uterine Device (1 each total) by Intrauterine route once for 1 dose. To be placed in the office, Disp: 1 each, Rfl: 0 .  mineral oil-hydrophilic petrolatum (AQUAPHOR) ointment, Apply 1 application topically daily. To lower extremities, Disp: 420 g, Rfl: 0 .  Multiple Vitamin (MULTIVITAMIN WITH MINERALS) TABS, Take 1 tablet by mouth daily., Disp: 30 tablet, Rfl: 0 .  polyethylene glycol (MIRALAX / GLYCOLAX) 17 g packet, Take 17 g by mouth daily. (Patient taking differently: Take 17 g by mouth daily as  needed for mild constipation. ), Disp: 14 each, Rfl: 0  Review of Symptoms: Review is negative except as above in Interval History.  Physical Exam: There were no vitals taken for this visit. Not performed given limitations of phone visit.  Laboratory & Radiologic Studies: None new  Assessment & Plan: Connie Miranda is a 85 y.o. woman with multiple medical comorbidities including being bedbound who has on staged uterine cancer currently being treated with progesterone via an IUD.  Overall the patient continues to do well and has had minimal symptoms related to her uterine cancer since we last spoke.  Patient continues not to be a candidate for standard of care therapy (surgery) or radiation treatment secondary to her comorbidities and overall limited mobility.  We discussed the success rate of local progesterone in regression of her grade 2 cancer.  Given a median time of 4.5 to 6 months, I recommended that we wait at least 6 months from IUD insertion before resampling.  Resampling would give [place] some indication of whether the IUD was having the  desired effect on her endometrial cancer.  If there is evidence of progression, then this would allow [place] the chance to discuss additional options which could include placement of a second Mirena IUD or consideration of oral progesterone.  Patient's daughter asked about repeat imaging.  Unless the patient were to have increased vaginal bleeding and becomes symptomatic, given her CT scan back in January that showed no evidence of metastatic disease, I do not think that repeat imaging is indicated currently.  Connie Miranda would like to be year for the patient's an office visit in either August or September.  She will call the office when she has her school schedule in several weeks to get a visit scheduled.  We discussed preprocedure medication to help make her mother more comfortable.  I discussed the assessment and treatment plan with the patient. The patient was provided with an opportunity to ask questions and all were answered. The patient agreed with the plan and demonstrated an understanding of the instructions.   The patient was advised to call back or see an in-person evaluation if the symptoms worsen or if the condition fails to improve as anticipated.   18 minutes of total time was spent for this patient encounter, including preparation, face-to-face counseling with the patient and coordination of care, and documentation of the encounter.   Jeral Pinch, MD  Division of Gynecologic Oncology  Department of Obstetrics and Gynecology  [facility]

## 2020-05-30 NOTE — Telephone Encounter (Signed)
Patient's daughter called and moved phone visit to 6/4

## 2020-06-01 ENCOUNTER — Telehealth: Payer: Self-pay | Admitting: Gynecologic Oncology

## 2020-06-01 ENCOUNTER — Encounter: Payer: Self-pay | Admitting: Gynecologic Oncology

## 2020-06-01 ENCOUNTER — Inpatient Hospital Stay: Payer: Medicare Other | Attending: Gynecologic Oncology | Admitting: Gynecologic Oncology

## 2020-06-01 DIAGNOSIS — C541 Malignant neoplasm of endometrium: Secondary | ICD-10-CM

## 2020-06-04 ENCOUNTER — Ambulatory Visit: Payer: Medicare Other | Admitting: Gynecologic Oncology

## 2020-06-05 ENCOUNTER — Ambulatory Visit: Payer: Medicare Other | Admitting: Gynecologic Oncology

## 2021-01-08 ENCOUNTER — Telehealth: Payer: Self-pay | Admitting: *Deleted

## 2021-01-08 NOTE — Telephone Encounter (Signed)
Called the patient's daughter and left a message to call the office back. Patient needs a follow up appt scheduled

## 2021-01-09 ENCOUNTER — Telehealth: Payer: Self-pay | Admitting: *Deleted

## 2021-01-09 NOTE — Telephone Encounter (Signed)
Called and spoke with the patient's daughter, she did receive the message from yesterday. Patient's daughter stated "I need to look at both my schedule and her schedule. I will call you back in about a week."

## 2021-02-11 ENCOUNTER — Telehealth: Payer: Self-pay | Admitting: *Deleted

## 2021-02-11 NOTE — Telephone Encounter (Signed)
Patient's daughter called and scheduled a follow up appt for 4/12

## 2021-04-08 ENCOUNTER — Encounter: Payer: Self-pay | Admitting: Gynecologic Oncology

## 2021-04-09 ENCOUNTER — Inpatient Hospital Stay: Payer: Medicare Other | Attending: Gynecologic Oncology | Admitting: Gynecologic Oncology

## 2021-04-09 ENCOUNTER — Other Ambulatory Visit: Payer: Self-pay

## 2021-04-09 ENCOUNTER — Encounter: Payer: Self-pay | Admitting: Gynecologic Oncology

## 2021-04-09 VITALS — BP 102/79 | HR 77 | Temp 98.4°F | Resp 18

## 2021-04-09 DIAGNOSIS — Z79899 Other long term (current) drug therapy: Secondary | ICD-10-CM | POA: Insufficient documentation

## 2021-04-09 DIAGNOSIS — Z87891 Personal history of nicotine dependence: Secondary | ICD-10-CM | POA: Insufficient documentation

## 2021-04-09 DIAGNOSIS — I10 Essential (primary) hypertension: Secondary | ICD-10-CM | POA: Insufficient documentation

## 2021-04-09 DIAGNOSIS — M1990 Unspecified osteoarthritis, unspecified site: Secondary | ICD-10-CM | POA: Insufficient documentation

## 2021-04-09 DIAGNOSIS — Z8673 Personal history of transient ischemic attack (TIA), and cerebral infarction without residual deficits: Secondary | ICD-10-CM | POA: Diagnosis not present

## 2021-04-09 DIAGNOSIS — C541 Malignant neoplasm of endometrium: Secondary | ICD-10-CM | POA: Diagnosis present

## 2021-04-09 NOTE — Patient Instructions (Signed)
Good to see you today. I will call you with the biopsy results once I have them back.

## 2021-04-09 NOTE — Addendum Note (Signed)
Addended by: Joylene John D on: 04/09/2021 04:12 PM   Modules accepted: Orders

## 2021-04-09 NOTE — Progress Notes (Signed)
Gynecologic Oncology Return Clinic Visit  04/09/21  Reason for Visit: surveillance visit in the setting of HT for endometrial cancer  Treatment History: Oncology History  Endometrial carcinoma ([place])  12/24/2019 Imaging   CT A/P: IMPRESSION: 1. The previously measured right vaginal fluid collection at 2.8 x 1 point 0 cm is not clearly demonstrated on today's study. There does appear to be a fluid collection in the left labia majora measuring 4.3 x 1.7 cm, similar to prior study. No features on today's study to suggest necrotizing fasciitis. 2. Marked apparent thickening of the endometrium. Neoplasm not excluded. Pelvic ultrasound recommended to further evaluate. 3. Collapsed gallbladder. Gallstones documented on previous cholecystostomy tube injection are not visualized on today's CT. 4.  Aortic Atherosclerois (ICD10-170.0)   12/26/2019 Imaging   Pelvic ultrasound: 1. Thickened endometrial stripe measuring up to 19.1 mm in thickness. Endometrial thickness is considered abnormal for an asymptomatic post-menopausal female. Endometrial sampling should be considered to exclude carcinoma. 2. Nonvisualization of the ovaries. No adnexal mass or abnormal free fluid within the pelvis. 3. No other acute abnormality.   01/02/2020 Initial Biopsy   D&C - Grade 2 endometrioid adenocarcinoma   01/02/2020 Initial Diagnosis   Endometrial carcinoma ([place])   02/08/2020 Treatment Plan Change   Mirena IUD placed     Interval History: Patient presents with her daughter and caregiver today.  They know overall she has been doing well.  They deny any vaginal bleeding or discharge.  They deny any complaints of pelvic pain or cramping.  She is having more regular bowel function.  Her appetite continues to be up and down and she is interested in eating only certain foods that she wants.  Prior to her appointment today, the patient had some nausea and emesis with perhaps loss of consciousness.  They called  EMS and it was felt that further evaluation or transfer to the hospital was not necessary.  Past Medical/Surgical History: Past Medical History:  Diagnosis Date  . Edema   . Edema of both legs 01/2016  . Glucose-6-phosphate dehydrogenase deficiency    possible diagnosis of G6PD per patient "phosphate 6 deficiency"  . Hypertension   . Osteoarthritis   . Pernicious anemia   . Stroke [facility]) 09/2000   couldn't move her left foot which resolved    Past Surgical History:  Procedure Laterality Date  . BONE MARROW BIOPSY  12/1966   for anemia  . IR PERC CHOLECYSTOSTOMY  04/08/2017  . IR RADIOLOGIST EVAL & MGMT  05/20/2017  . IR SINUS/FIST TUBE CHK-NON GI  06/11/2017  . MULTIPLE TOOTH EXTRACTIONS    . OTHER SURGICAL HISTORY     cyst from panty line removed as child    Family History  Problem Relation Age of Onset  . Diabetes Brother   . Diabetes Brother   . Heart failure Mother 63  . Heart attack Father 7    Social History   Socioeconomic History  . Marital status: Single    Spouse name: Not on file  . Number of children: Not on file  . Years of education: Not on file  . Highest education level: Not on file  Occupational History  . Not on file  Tobacco Use  . Smoking status: Former Smoker    Types: Cigarettes    Quit date: 12/29/1993    Years since quitting: 27.2  . Smokeless tobacco: Never Used  Substance and Sexual Activity  . Alcohol use: No  . Drug use: No  . Sexual activity:  Not Currently  Other Topics Concern  . Not on file  Social History Narrative   Live alone in house and uses a cane and wheelchair to get around.  She has not been upstairs in over a year.  She does not drive, but she uses a cab or bus to go to grocery store, but usually friends and family bring her things.  Niece and nephew in [place].     Social Determinants of Health   Financial Resource Strain: Not on file  Food Insecurity: Not on file  Transportation Needs: Not on file  Physical  Activity: Not on file  Stress: Not on file  Social Connections: Not on file    Current Medications:  Current Outpatient Medications:  .  acetaminophen (TYLENOL) 500 MG tablet, Take 500 mg by mouth every 6 (six) hours as needed for moderate pain., Disp: , Rfl:  .  amLODipine (NORVASC) 10 MG tablet, Take 1 tablet (10 mg total) by mouth daily., Disp: 30 tablet, Rfl: 0 .  Calcium Carbonate-Vitamin D (CALCIUM + D PO), Take 1 tablet by mouth daily., Disp: , Rfl:  .  clopidogrel (PLAVIX) 75 MG tablet, Take 1 tablet (75 mg total) by mouth daily., Disp: 30 tablet, Rfl: 0 .  cyanocobalamin (,VITAMIN B-12,) 1000 MCG/ML injection, Inject 1 mL (1,000 mcg total) into the muscle every 30 (thirty) days., Disp: 1 mL, Rfl: 0 .  isosorbide mononitrate (IMDUR) 120 MG 24 hr tablet, Take 120 mg by mouth daily., Disp: , Rfl:  .  mineral oil-hydrophilic petrolatum (AQUAPHOR) ointment, Apply 1 application topically daily. To lower extremities, Disp: 420 g, Rfl: 0 .  Multiple Vitamin (MULTIVITAMIN WITH MINERALS) TABS, Take 1 tablet by mouth daily., Disp: 30 tablet, Rfl: 0 .  polyethylene glycol (MIRALAX / GLYCOLAX) 17 g packet, Take 17 g by mouth daily. (Patient taking differently: Take 17 g by mouth daily as needed for mild constipation.), Disp: 14 each, Rfl: 0 .  furosemide (LASIX) 40 MG tablet, Take 3 tablets (120 mg total) by mouth daily., Disp: 90 tablet, Rfl: 11 .  levonorgestrel (MIRENA) 20 MCG/24HR IUD, 1 Intra Uterine Device (1 each total) by Intrauterine route once for 1 dose. To be placed in the office, Disp: 1 each, Rfl: 0  Review of Systems: + Fatigue Denies appetite changes, fevers, chills, unexplained weight changes. Denies hearing loss, neck lumps or masses, mouth sores, ringing in ears or voice changes. Denies cough or wheezing.  Denies shortness of breath. Denies chest pain or palpitations. Denies leg swelling. Denies abdominal distention, pain, blood in stools, constipation, diarrhea, nausea,  vomiting, or early satiety. Denies pain with intercourse, dysuria, frequency, hematuria or incontinence. Denies hot flashes, pelvic pain, vaginal bleeding or vaginal discharge.   Denies joint pain, back pain or muscle pain/cramps. Denies itching, rash, or wounds. Denies dizziness, headaches, numbness or seizures. Denies swollen lymph nodes or glands, denies easy bruising or bleeding. Denies anxiety, depression, confusion, or decreased concentration.  Physical Exam: BP 102/79 (BP Location: Left Arm, Patient Position: Sitting)   Pulse 77   Temp 98.4 F (36.9 C) (Oral)   Resp 18   SpO2 95%  General: Alert, oriented, no acute distress. HEENT: Normocephalic, atraumatic, sclera anicteric. Chest: Unlabored breathing on room air. Abdomen: Obese, soft, nontender.  Normoactive bowel sounds.  No masses or hepatosplenomegaly appreciated.   Extremities: Significantly limited lower extremity mobility due to pain.  Sores and hyperpigmented lesions covering patient's lower legs bilaterally.  Lymphatics: No cervical, supraclavicular, or inguinal adenopathy. GU: Normal  appearing external genitalia without erythema, excoriation, or lesions.  There is some stool as well as blood-tinged discharge on the patient's perineum and vulva.  This was wiped away with washcloths.  Speculum was then placed in the vagina and there was some blood-tinged discharge within the vagina.  The cervix, due to its location and the patient's inability to bend or separate her legs was not visible in its location posterior to the pubic symphysis.  On bimanual exam, the cervix was palpated and normal without masses.  IUD strings were appreciated.  Decision was made to proceed with endometrial biopsy over my finger.    Endometrial biopsy Preoperative diagnosis: Clinical stage I endometrial cancer undergoing treatment with progesterone IUD Postoperative diagnosis: Same as above Physician: Berline Lopes, MD Specimens: Endometrial  biopsy Estimated blood loss: Minimal Patient was placed in modified dorsolithotomy after verbal consent was obtained from the patient as well as family and caregiver present.  Given inability to visualize the cervix with a speculum due to patient's limited lower extremity mobility and position of her cervix, bimanual exam was performed with findings as noted above.  Decision was made to proceed with biopsy over my hands.  Endometrial [place] was guided into the cervical os over my finger until resistance met at the uterine fundus.  1 pass was performed with some blood and tissue obtained.  This was placed in formalin to be sent to pathology.  Overall the patient tolerated the procedure well.  Laboratory & Radiologic Studies: None new.  Assessment & Plan: Connie Miranda is a 87 y.o. woman with clinical stage I endometrioid endometrial adenocarcinoma who is undergoing treatment with Mirena IUD.  Patient has been asymptomatic according to family and caretaker.  On exam today, she does have some vaginal discharge.  This sounds out of the norm based on report given today.  Endometrial biopsy was performed.  I talked to her daughter about possible options depending on what the biopsy shows.  If there has been regression or clearance, then we will continue with progestin treatment with the IUD.  If persistent endometrial cancer noted, then I think it would be reasonable to add an oral progestin.  I will call them once biopsy results are back.  36 minutes of total time was spent for this patient encounter, including preparation, face-to-face counseling with the patient and coordination of care, and documentation of the encounter.  Jeral Pinch, MD  Division of Gynecologic Oncology  Department of Obstetrics and Gynecology  [facility]

## 2021-04-11 ENCOUNTER — Other Ambulatory Visit: Payer: Self-pay

## 2021-04-16 ENCOUNTER — Telehealth: Payer: Self-pay | Admitting: Gynecologic Oncology

## 2021-04-16 NOTE — Telephone Encounter (Signed)
Called and spoke to the patient's daughter for some time about biopsy showing persistent grade 2 endometrioid adenocarcinoma. Discussed options including adding oral progesterone agent (reviewed SEs), surgery, and definitive RT. She still is not an ideal surgical candidate and her lack of mobility would make RT very difficult. I will have my office fax preop clearance to her PCP and the daughter will talk to rest of family about treatment options. I will also reach out to Dr. Sondra Come regarding possibility of RT.  Jeral Pinch MD Gynecologic Oncology

## 2021-04-18 LAB — SURGICAL PATHOLOGY

## 2021-04-22 ENCOUNTER — Telehealth: Payer: Self-pay | Admitting: Oncology

## 2021-04-22 NOTE — Telephone Encounter (Signed)
Connie Miranda called back and said they have decided on surgery.  She said radiation would be too difficult logistically and that they are worried about their family history of blood clots with the progesterone pills.  She has reached out to Paitynn's PCP regarding surgery and has explained the situation and the need for clearance.  She asked when surgery would be scheduled and has to be out of town June 2 - 6.  She would like to plan around that time if possible so that she can be here for the surgery.  She also asked about having Rillie admitted to a rehab facility after surgery.  Advised her that I will notify Dr. Berline Lopes and Joylene John, NP and we will call back with potential surgery dates and more information about the rehab admission.

## 2021-04-22 NOTE — Telephone Encounter (Signed)
Left a message for [name] (daughter) regarding treatment decision.

## 2021-05-17 ENCOUNTER — Other Ambulatory Visit: Payer: Self-pay

## 2021-05-17 ENCOUNTER — Encounter: Payer: Self-pay | Admitting: Cardiology

## 2021-05-17 ENCOUNTER — Ambulatory Visit (INDEPENDENT_AMBULATORY_CARE_PROVIDER_SITE_OTHER): Payer: Medicare Other | Admitting: Cardiology

## 2021-05-17 VITALS — BP 120/69 | HR 102 | Ht 64.0 in

## 2021-05-17 DIAGNOSIS — Z01810 Encounter for preprocedural cardiovascular examination: Secondary | ICD-10-CM

## 2021-05-17 DIAGNOSIS — I10 Essential (primary) hypertension: Secondary | ICD-10-CM

## 2021-05-17 DIAGNOSIS — I700 Atherosclerosis of aorta: Secondary | ICD-10-CM | POA: Diagnosis not present

## 2021-05-17 NOTE — Progress Notes (Signed)
Cardiology Office Note:    Date:  05/17/2021   ID:  Connie Miranda, DOB 12/27/1933, MRN [id number]  PCP:  Clovia Cuff, MD   [facility] Providers Cardiologist:  None     Referring MD: Lafonda Mosses, MD    History of Present Illness:    Connie Miranda is a 87 y.o. female here for preoperative cardiac evaluation prior to endometrial cancer surgery at the request of Dr. Jeral Pinch.  Today, she is accompanied by her family member. She reports that she is fatigue. She has a history of clots. She is non-ambulatory. She does vomit her food up occasionally. She had a stroke 2001. Quit smoking 20 years ago. She denies having any shortness of breath, chest pain, tightness, or pressure. She has no lightheadedness, LE edema, syncopal episodes, PND, or orthopnea.    Past Medical History:  Diagnosis Date  . Edema   . Edema of both legs 01/2016  . Glucose-6-phosphate dehydrogenase deficiency    possible diagnosis of G6PD per patient "phosphate 6 deficiency"  . Hypertension   . Osteoarthritis   . Pernicious anemia   . Stroke [facility]) 09/2000   couldn't move her left foot which resolved    Past Surgical History:  Procedure Laterality Date  . BONE MARROW BIOPSY  12/1966   for anemia  . IR PERC CHOLECYSTOSTOMY  04/08/2017  . IR RADIOLOGIST EVAL & MGMT  05/20/2017  . IR SINUS/FIST TUBE CHK-NON GI  06/11/2017  . MULTIPLE TOOTH EXTRACTIONS    . OTHER SURGICAL HISTORY     cyst from panty line removed as child    Current Medications: Current Meds  Medication Sig  . acetaminophen (TYLENOL) 500 MG tablet Take 500 mg by mouth every 6 (six) hours as needed for moderate pain.  Marland Kitchen amLODipine (NORVASC) 10 MG tablet Take 1 tablet (10 mg total) by mouth daily.  . Calcium Carbonate-Vitamin D (CALCIUM + D PO) Take 1 tablet by mouth daily.  . clopidogrel (PLAVIX) 75 MG tablet Take 1 tablet (75 mg total) by mouth daily.  . cyanocobalamin (,VITAMIN B-12,) 1000 MCG/ML injection Inject 1 mL  (1,000 mcg total) into the muscle every 30 (thirty) days.  . furosemide (LASIX) 40 MG tablet Take 3 tablets (120 mg total) by mouth daily.  . isosorbide mononitrate (IMDUR) 120 MG 24 hr tablet Take 120 mg by mouth daily.  Marland Kitchen levonorgestrel (MIRENA) 20 MCG/24HR IUD 1 Intra Uterine Device (1 each total) by Intrauterine route once for 1 dose. To be placed in the office  . mineral oil-hydrophilic petrolatum (AQUAPHOR) ointment Apply 1 application topically daily. To lower extremities  . Multiple Vitamin (MULTIVITAMIN WITH MINERALS) TABS Take 1 tablet by mouth daily.  . polyethylene glycol (MIRALAX / GLYCOLAX) 17 g packet Take 17 g by mouth daily.     Allergies:   Asa [aspirin], Phosphate, Codeine, Penicillins, and Sodium phosphate   Social History   Socioeconomic History  . Marital status: Single    Spouse name: Not on file  . Number of children: Not on file  . Years of education: Not on file  . Highest education level: Not on file  Occupational History  . Not on file  Tobacco Use  . Smoking status: Former Smoker    Types: Cigarettes    Quit date: 12/29/1993    Years since quitting: 27.4  . Smokeless tobacco: Never Used  Substance and Sexual Activity  . Alcohol use: No  . Drug use: No  . Sexual activity:  Not Currently  Other Topics Concern  . Not on file  Social History Narrative   Live alone in house and uses a cane and wheelchair to get around.  She has not been upstairs in over a year.  She does not drive, but she uses a cab or bus to go to grocery store, but usually friends and family bring her things.  Niece and nephew in [place].     Social Determinants of Health   Financial Resource Strain: Not on file  Food Insecurity: Not on file  Transportation Needs: Not on file  Physical Activity: Not on file  Stress: Not on file  Social Connections: Not on file     Family History: The patient's family history includes Diabetes in her brother and brother; Heart attack (age of  onset: 5) in her father; Heart failure (age of onset: 56) in her mother.  ROS:   Please see the history of present illness. All other systems reviewed and are negative.  EKGs/Labs/Other Studies Reviewed:    The following studies were reviewed today: VAS [place] Lower Extremity Venous Bilateral 02/11/16: - No evidence of deep vein thrombosis involving the right lower  extremity.  - No evidence of deep vein thrombosis involving the left lower  extremity.   DG Chest CT 1`02/23/19: FINDINGS: The heart size and mediastinal contours are within normal limits. Both lungs are clear. The visualized skeletal structures are stable.   EKG:  05/17/21-sinus tacycardia, rate 102 bpm, 1st degree AV Block 210 ms, non ST wave changes    Recent Labs: No results found for requested labs within last 8760 hours.  Recent Lipid Panel    Component Value Date/Time   CHOL 153 02/12/2016 0625   TRIG 140 02/12/2016 0625   HDL 24 (L) 02/12/2016 0625   CHOLHDL 6.4 02/12/2016 0625   VLDL 28 02/12/2016 0625   LDLCALC 101 (H) 02/12/2016 0625     Risk Assessment/Calculations:      Physical Exam:    VS:  BP 120/69   Pulse (!) 102   Ht 5' 4"  (1.626 m)   SpO2 93%   BMI 38.79 kg/m     Wt Readings from Last 3 Encounters:  02/08/20 226 lb (102.5 kg)  01/06/20 257 lb 0.9 oz (116.6 kg)  12/26/19 242 lb 8.1 oz (110 kg)     GEN: Well nourished, well developed in no acute distress HEENT: Normal NECK: No JVD; No carotid bruits LYMPHATICS: No lymphadenopathy CARDIAC: RRR, no murmurs, rubs, gallops RESPIRATORY:  Clear to auscultation without rales, wheezing or rhonchi  ABDOMEN: Soft, non-tender, non-distended MUSCULOSKELETAL: Chronic swelling, No deformity  SKIN: Warm and dry NEUROLOGIC:  Alert and oriented x 3 PSYCHIATRIC:  Normal affect   ASSESSMENT:    1. Preop cardiovascular exam   2. Hypertension, unspecified type   3. Aortic atherosclerosis (HCC)    PLAN:    In order of problems listed  above:  Preoperative cardiac risk assessment - No chest pain, no shortness of breath.  Has endometrial adenocarcinoma which has advanced to grade 2.  Undergone treatment with Mirena IUD. - Had lengthy discussion with she and her daughter.  If no surgery were performed, she would have worsening of her endometrial cancer with possible mortality and 18 to 24 months according to her daughter's discussion with Dr. Berline Lopes.  Her daughter is mainly concerned about potential increases in pain and bleeding.  Dr. Berline Lopes is willing to perform the surgery robotically for her.  I think from a cardiac  perspective she is of moderate to high risk based mainly upon her advanced age and prior stroke.  She may proceed with surgery if this is felt to be a viable option.  My main concerns for her are her lack of mobility which could lead to postop complications such as pneumonia, thrombosis etc which are the same fears of Dr. Charisse March as well.  Her daughter is aware of this and once again reiterated her fears of worsening pain and bleeding in the future.  Given her current clinical state, lack of mobility and transfer, she is not a candidate for stress testing.   No prior cardiac history.  Stroke - 2001, immobile.  Currently on Plavix.  Prior to surgery, she may hold her Plavix for 5 days and resume postoperatively.  Aortic atherosclerosis - Aortic atherosclerosis was personally reviewed and interpreted on her CT scan on 01/05/2020.  Recommend goal-directed medical therapy.   Medication Adjustments/Labs and Tests Ordered: Current medicines are reviewed at length with the patient today.  Concerns regarding medicines are outlined above.  Orders Placed This Encounter  Procedures  . EKG 12-Lead   No orders of the defined types were placed in this encounter.   Patient Instructions  Medication Instructions:  The current medical regimen is effective;  continue present plan and medications.  *If you need a refill on your  cardiac medications before your next appointment, please call your pharmacy*  Follow-Up: At [facility], you and your health needs are our priority.  As part of our continuing mission to provide you with exceptional heart care, we have created designated Provider Care Teams.  These Care Teams include your primary Cardiologist (physician) and Advanced Practice Providers (APPs -  Physician Assistants and Nurse Practitioners) who all work together to provide you with the care you need, when you need it.  We recommend signing up for the patient portal called "MyChart".  Sign up information is provided on this After Visit Summary.  MyChart is used to connect with patients for Virtual Visits (Telemedicine).  Patients are able to view lab/test results, encounter notes, upcoming appointments, etc.  Non-urgent messages can be sent to your provider as well.   To learn more about what you can do with MyChart, go to [email or web address].    Your next appointment:   Follow up as needed with Dr Marlou Porch   Thank you for choosing [facility]!!          I,Alexis Bryant,acting as a scribe for Candee Furbish, MD.,have documented all relevant documentation on the behalf of Candee Furbish, MD,as directed by  Candee Furbish, MD while in the presence of Candee Furbish, MD.   I, Candee Furbish, MD, have reviewed all documentation for this visit. The documentation on 05/17/21 for the exam, diagnosis, procedures, and orders are all accurate and complete.  Signed, Candee Furbish, MD  05/17/2021 2:41 PM    [place]

## 2021-05-17 NOTE — Patient Instructions (Signed)
Medication Instructions:  ?The current medical regimen is effective;  continue present plan and medications. ? ?*If you need a refill on your cardiac medications before your next appointment, please call your pharmacy* ? ?Follow-Up: ?At [facility], you and your health needs are our priority.  As part of our continuing mission to provide you with exceptional heart care, we have created designated Provider Care Teams.  These Care Teams include your primary Cardiologist (physician) and Advanced Practice Providers (APPs -  Physician Assistants and Nurse Practitioners) who all work together to provide you with the care you need, when you need it. ? ?We recommend signing up for the patient portal called "MyChart".  Sign up information is provided on this After Visit Summary.  MyChart is used to connect with patients for Virtual Visits (Telemedicine).  Patients are able to view lab/test results, encounter notes, upcoming appointments, etc.  Non-urgent messages can be sent to your provider as well.   ?To learn more about what you can do with MyChart, go to [email or web address].   ? ?Your next appointment:   ?Follow up as needed with Dr Skains ? ?Thank you for choosing [facility]!! ? ? ? ?

## 2021-05-22 ENCOUNTER — Telehealth: Payer: Self-pay | Admitting: Gynecologic Oncology

## 2021-05-22 NOTE — Telephone Encounter (Signed)
Had a long conversation with patient's daughter. Reviewed their recent visit with cardiologist. Spoke again about risks associated with all treatment options including surgery, RT, and adding oral progesterone to current progesterone IUD.  Ronney Lion thinks her mother is going to want to move forward with surgery. She voices understanding of the risks associated with the surgery itself (complications and cardiac risks) as well as during the post-operative period. She is going to talk to her mother again after our conversation today and I have asked Santiago Glad to call Friday. If they would like to proceed with surgery, we will work on scheduling a pre-op appt and find a date for surgery.  Jeral Pinch MD Gynecologic Oncology

## 2021-05-24 ENCOUNTER — Telehealth: Payer: Self-pay | Admitting: Oncology

## 2021-05-24 NOTE — Telephone Encounter (Signed)
Called Ronney Lion (daughter) regarding Kesleigh's decision for treatment.  She has not had a chance to talk to [place] privately.  She is going to try to talk to her today and will call with their decision by Tuesday.

## 2021-05-30 ENCOUNTER — Telehealth: Payer: Self-pay | Admitting: Oncology

## 2021-05-30 NOTE — Telephone Encounter (Signed)
Left a message for Connie Miranda to check in and see if they have made a decision regarding surgery.

## 2021-06-03 NOTE — Telephone Encounter (Signed)
Connie Miranda called back and left a message that Connie Miranda would like surgery.  They are wondering what next steps would be.

## 2021-06-03 NOTE — Telephone Encounter (Signed)
Called [name] back to schedule pre op appointment with Dr. Berline Lopes.  [name] asked if this visit could be a phone visit.  If it does need to be in person, she is requesting an appointment after 06/14/21.

## 2021-06-04 ENCOUNTER — Telehealth: Payer: Self-pay | Admitting: Oncology

## 2021-06-04 NOTE — Telephone Encounter (Signed)
Called [name] and discussed possible surgery date of 07/02/21 and what their discharge plans are after surgery.  They would like to have Delisa admitted to a nursing home if possible after surgery and would like the surgery the last week in June or the end of July.  Reviewed with Dr. Berline Lopes and advised Ronney Lion that Decklyn would not qualify for a nursing home after surgery.  Also asked if July 18, 2021 would work for surgery.  She would like to take that date.  Advised her that Joylene John, NP will call her to go over pre op instructions.  She verbalized understanding and agreement.

## 2021-06-28 NOTE — Progress Notes (Signed)
DUE TO COVID-19 ONLY ONE VISITOR IS ALLOWED TO COME WITH YOU AND STAY IN THE WAITING ROOM ONLY DURING PRE OP AND PROCEDURE DAY OF SURGERY. THE 1 VISITOR  MAY VISIT WITH YOU AFTER SURGERY IN YOUR PRIVATE ROOM DURING VISITING HOURS ONLY!  YOU NEED TO HAVE A COVID 19 TEST ON____7/18/2022 ___ @_______ , THIS TEST MUST BE DONE BEFORE SURGERY,  COVID TESTING SITE [address] [place] [id number], IT IS ON THE RIGHT GOING OUT [address] APPROXIMATELY  2 MINUTES PAST [employer] ON THE RIGHT. ONCE YOUR COVID TEST IS COMPLETED,  PLEASE BEGIN THE QUARANTINE INSTRUCTIONS AS OUTLINED IN YOUR HANDOUT.                Connie Miranda  06/28/2021   Your procedure is scheduled on:     07/18/21   Report to [facility] Main  Entrance   Report to admitting at     AM     Call this number if you have problems the morning of surgery [phone]    Remember: Do not eat food , candy gum or mints :After Midnight. You may have clear liquids from midnight until  0730 am    CLEAR LIQUID DIET   Foods Allowed                                                                       Coffee and tea, regular and decaf                              Plain Jell-O any favor except red or purple                                            Fruit ices (not with fruit pulp)                                      Iced Popsicles                                     Carbonated beverages, regular and diet                                    Cranberry, grape and apple juices Sports drinks like Gatorade Lightly seasoned clear broth or consume(fat free) Sugar, honey syrup   _____________________________________________________________________   BRUSH YOUR TEETH MORNING OF SURGERY AND RINSE YOUR MOUTH OUT, NO CHEWING GUM CANDY OR MINTS.     Take these medicines the morning of surgery with A SIP OF WATER:       Imdur amlodipine  DO NOT TAKE ANY DIABETIC MEDICATIONS DAY OF YOUR SURGERY  You may not have any metal on your body including hair pins and              piercings  Do not wear jewelry, make-up, lotions, powders or perfumes, deodorant             Do not wear nail polish on your fingernails.  Do not shave  48 hours prior to surgery.              Men may shave face and neck.   Do not bring valuables to the hospital. [place].  Contacts, dentures or bridgework may not be worn into surgery.  Leave suitcase in the car. After surgery it may be brought to your room.     Patients discharged the day of surgery will not be allowed to drive home. IF YOU ARE HAVING SURGERY AND GOING HOME THE SAME DAY, YOU MUST HAVE AN ADULT TO DRIVE YOU HOME AND BE WITH YOU FOR 24 HOURS. YOU MAY GO HOME BY TAXI OR UBER OR ORTHERWISE, BUT AN ADULT MUST ACCOMPANY YOU HOME AND STAY WITH YOU FOR 24 HOURS.  Name and phone number of your driver:  Special Instructions: N/A              Please read over the following fact sheets you were given: _____________________________________________________________________  [facility] - Preparing for Surgery Before surgery, you can play an important role.  Because skin is not sterile, your skin needs to be as free of germs as possible.  You can reduce the number of germs on your skin by washing with CHG (chlorahexidine gluconate) soap before surgery.  CHG is an antiseptic cleaner which kills germs and bonds with the skin to continue killing germs even after washing. Please DO NOT use if you have an allergy to CHG or antibacterial soaps.  If your skin becomes reddened/irritated stop using the CHG and inform your nurse when you arrive at Short Stay. Do not shave (including legs and underarms) for at least 48 hours prior to the first CHG shower.  You may shave your face/neck. Please follow these instructions carefully:  1.  Shower with CHG Soap the night before surgery and the  morning of Surgery.  2.   If you choose to wash your hair, wash your hair first as usual with your  normal  shampoo.  3.  After you shampoo, rinse your hair and body thoroughly to remove the  shampoo.                           4.  Use CHG as you would any other liquid soap.  You can apply chg directly  to the skin and wash                       Gently with a scrungie or clean washcloth.  5.  Apply the CHG Soap to your body ONLY FROM THE NECK DOWN.   Do not use on face/ open                           Wound or open sores. Avoid contact with eyes, ears mouth and genitals (private parts).  Wash face,  Genitals (private parts) with your normal soap.             6.  Wash thoroughly, paying special attention to the area where your surgery  will be performed.  7.  Thoroughly rinse your body with warm water from the neck down.  8.  DO NOT shower/wash with your normal soap after using and rinsing off  the CHG Soap.                9.  Pat yourself dry with a clean towel.            10.  Wear clean pajamas.            11.  Place clean sheets on your bed the night of your first shower and do not  sleep with pets. Day of Surgery : Do not apply any lotions/deodorants the morning of surgery.  Please wear clean clothes to the hospital/surgery center.  FAILURE TO FOLLOW THESE INSTRUCTIONS MAY RESULT IN THE CANCELLATION OF YOUR SURGERY PATIENT SIGNATURE_________________________________  NURSE SIGNATURE__________________________________  ________________________________________________________________________

## 2021-07-03 ENCOUNTER — Encounter (HOSPITAL_COMMUNITY)
Admission: RE | Admit: 2021-07-03 | Discharge: 2021-07-03 | Disposition: A | Discharge status: Home / Self Care | Payer: Medicare Other | Source: Ambulatory Visit | Attending: Gynecologic Oncology | Admitting: Gynecologic Oncology

## 2021-07-03 ENCOUNTER — Other Ambulatory Visit: Payer: Self-pay

## 2021-07-03 ENCOUNTER — Encounter (HOSPITAL_COMMUNITY): Payer: Self-pay

## 2021-07-03 DIAGNOSIS — Z01812 Encounter for preprocedural laboratory examination: Secondary | ICD-10-CM | POA: Diagnosis not present

## 2021-07-03 LAB — COMPREHENSIVE METABOLIC PANEL
ALT: 8 U/L (ref 0–44)
AST: 19 U/L (ref 15–41)
Albumin: 2.6 g/dL — ABNORMAL LOW (ref 3.5–5.0)
Alkaline Phosphatase: 97 U/L (ref 38–126)
Anion gap: 8 (ref 5–15)
BUN: 15 mg/dL (ref 8–23)
CO2: 24 mmol/L (ref 22–32)
Calcium: 8.8 mg/dL — ABNORMAL LOW (ref 8.9–10.3)
Chloride: 107 mmol/L (ref 98–111)
Creatinine, Ser: 0.76 mg/dL (ref 0.44–1.00)
GFR, Estimated: >60 mL/min (ref >60)
Glucose, Bld: 166 mg/dL — ABNORMAL HIGH (ref 70–99)
Potassium: 3.3 mmol/L — ABNORMAL LOW (ref 3.5–5.1)
Sodium: 139 mmol/L (ref 135–145)
Total Bilirubin: 0.7 mg/dL (ref 0.3–1.2)
Total Protein: 8 g/dL (ref 6.5–8.1)

## 2021-07-03 LAB — CBC
HCT: 31.3 % — ABNORMAL LOW (ref 36.0–46.0)
Hemoglobin: 9.7 g/dL — ABNORMAL LOW (ref 12.0–15.0)
MCH: 24.4 pg — ABNORMAL LOW (ref 26.0–34.0)
MCHC: 31 g/dL (ref 30.0–36.0)
MCV: 78.8 fL — ABNORMAL LOW (ref 80.0–100.0)
Platelets: 702 10*3/uL — ABNORMAL HIGH (ref 150–400)
RBC: 3.97 MIL/uL (ref 3.87–5.11)
RDW: 24.4 % — ABNORMAL HIGH (ref 11.5–15.5)
WBC: 12.8 10*3/uL — ABNORMAL HIGH (ref 4.0–10.5)
nRBC: 0 % (ref 0.0–0.2)

## 2021-07-03 NOTE — Progress Notes (Addendum)
Anesthesia Review:  PCP: DR phillip Asenso - with [place] Call visit 06/08/21.   Cardiologist : DR Candee Furbish LOV 05/17/2021  Chest x-ray : EKG :05/17/21 Echo : Stress test: Cardiac Cath :  Activity level: no  Sleep Study/ CPAP : no  Fasting Blood Sugar :      / Checks Blood Sugar -- times a day:   Blood Thinner/ Instructions /Last Dose: ASA / Instructions/ Last Dose :   Plavix - pt's daughter has received no preop instructions.  Instructed dtr to call office of DR Berline Lopes in regards to this.  Bedridden, wheelchair, hoyer lift, wears diaper  Alert , Is aware having surgery but does not know what type of surgery ,  A and O x 3  Poor mobility  CBC  and CMPdone 07/03/21 routed to DR Berline Lopes and Joylene John, NP. Called and spoke with Barbaraann Share , RN at office and made her aware of labs results of CBC and CMP .  PT has 24/7 caregiveers- mainly  - Lewie Loron who is a family friend.   Gets to appts by [employer] --[phone] Dtr [place] [phone] [facility] [place] POA and Living Will are located under Media Tab in epic.  02/14/20.

## 2021-07-04 ENCOUNTER — Telehealth: Payer: Self-pay

## 2021-07-04 NOTE — Telephone Encounter (Signed)
Dr. Daphene Jaeger left a message stating that Connie Miranda had a normal WBC a year ago. If she is febrile, a u/a could be obtained.  The process going on in her abdomen could be causing increased WBC.

## 2021-07-04 NOTE — Telephone Encounter (Signed)
LM for PCP's office to call back to see if any recent lab work done to see if WBC has been elevated. If no lab work requesting to evaluate elevated WBC of 12.8 on pre-op labs done 07-03-21.

## 2021-07-05 NOTE — Progress Notes (Signed)
Anesthesia Chart Review:   Case: [id number] Date/Time: 07/18/21 0915   Procedures:      XI ROBOTIC ASSISTED TOTAL HYSTERECTOMY WITH BILATERAL SALPINGO OOPHORECTOMY, POSSIBLE LAPAROTOMY     POSSIBLE SENTINEL NODE BIOPSY     POSSIBLE PELVIC LYMPH NODE DISSECTION   Anesthesia type: General   Pre-op diagnosis: ENDOMETRIAL CANCER   Location: [facility] ROOM 02 / [facility]   Surgeons: Lafonda Mosses, MD       DISCUSSION: Pt is 87 years old with hx stroke, HTN  Pt is not ambulatory  VS: BP 135/63   Pulse 98   Temp 37 C (Oral)   Resp 16   SpO2 100%   PROVIDERS: - PCP is Clovia Cuff, MD - Saw cardiologist Candee Furbish, MD 05/17/21 for pre-op eval. He feels pt is at moderate to high risk due to advanced age and prior stroke. He notes: "My main concerns for her are her lack of mobility which could lead to postop complications such as pneumonia, thrombosis etc ... Given her current clinical state, lack of mobility and transfer, she is not a candidate for stress testing."   LABS: Labs reviewed: Acceptable for surgery. (all labs ordered are listed, but only abnormal results are displayed)  Labs Reviewed  CBC - Abnormal; Notable for the following components:      Result Value   WBC 12.8 (*)    Hemoglobin 9.7 (*)    HCT 31.3 (*)    MCV 78.8 (*)    MCH 24.4 (*)    RDW 24.4 (*)    Platelets 702 (*)    All other components within normal limits  COMPREHENSIVE METABOLIC PANEL - Abnormal; Notable for the following components:   Potassium 3.3 (*)    Glucose, Bld 166 (*)    Calcium 8.8 (*)    Albumin 2.6 (*)    All other components within normal limits    EKG 05/17/21: sinus tachycardia (102 bpm). First degree AV block. Nonspecific ST changes   CV: None in last 5 years.   Past Medical History:  Diagnosis Date   Edema    Edema of both legs 01/30/2016   Glucose-6-phosphate dehydrogenase deficiency    possible diagnosis of G6PD per patient "phosphate 6 deficiency"   Hypertension     Osteoarthritis    Pernicious anemia    Stroke ([place]) 09/28/2000   couldn't move her left foot which resolved    Past Surgical History:  Procedure Laterality Date   BONE MARROW BIOPSY  12/1966   for anemia   IR PERC CHOLECYSTOSTOMY  04/08/2017   IR RADIOLOGIST EVAL & MGMT  05/20/2017   IR SINUS/FIST TUBE CHK-NON GI  06/11/2017   MULTIPLE TOOTH EXTRACTIONS     OTHER SURGICAL HISTORY     cyst from panty line removed as child    MEDICATIONS:  acetaminophen (TYLENOL) 500 MG tablet   amLODipine (NORVASC) 10 MG tablet   Calcium Carbonate-Vitamin D (CALCIUM + D PO)   clopidogrel (PLAVIX) 75 MG tablet   cyanocobalamin (,VITAMIN B-12,) 1000 MCG/ML injection   furosemide (LASIX) 40 MG tablet   isosorbide mononitrate (IMDUR) 120 MG 24 hr tablet   levonorgestrel (MIRENA) 20 MCG/24HR IUD   liver oil-zinc oxide (DESITIN) 40 % ointment   Multiple Vitamin (MULTIVITAMIN WITH MINERALS) TABS   Polyethyl Glycol-Propyl Glycol (LUBRICANT EYE DROPS) 0.4-0.3 % SOLN   polyethylene glycol (MIRALAX / GLYCOLAX) 17 g packet   white petrolatum (VASELINE) GEL   No current facility-administered medications for this encounter.  If no changes, I anticipate pt can proceed with surgery as scheduled.   Willeen Cass, PhD, FNP-BC [facility]/Anesthesiology Phone: [phone] 07/05/2021 2:00 PM

## 2021-07-05 NOTE — Telephone Encounter (Signed)
Requesting that a provider obtain a catheterized urine specimen at the patient's home to evaluate her for UTI with elevated WBC as she is scheduled for major surgery on 07-18-21.

## 2021-07-05 NOTE — Anesthesia Preprocedure Evaluation (Addendum)
Anesthesia Evaluation  Patient identified by MRN, date of birth, ID band Patient awake    Reviewed: Allergy & Precautions, NPO status , Patient's Chart, lab work & pertinent test results  Airway Mallampati: II  TM Distance: >3 FB Neck ROM: Full    Dental  (+) Dental Advisory Given, Edentulous Lower, Edentulous Upper   Pulmonary former smoker,    Pulmonary exam normal breath sounds clear to auscultation       Cardiovascular hypertension, Pt. on medications +CHF  Normal cardiovascular exam Rhythm:Regular Rate:Normal     Neuro/Psych CVA, No Residual Symptoms    GI/Hepatic negative GI ROS, Neg liver ROS,   Endo/Other  negative endocrine ROS  Renal/GU negative Renal ROS     Musculoskeletal  (+) Arthritis ,   Abdominal   Peds  Hematology  (+) Blood dyscrasia (Plavix), anemia , G-6-PD deficiency   Anesthesia Other Findings Day of surgery medications reviewed with the patient.  Reproductive/Obstetrics ENDOMETRIAL CANCER                           Anesthesia Physical Anesthesia Plan  ASA: 4  Anesthesia Plan: General   Post-op Pain Management:    Induction: Intravenous  PONV Risk Score and Plan: 4 or greater and Dexamethasone and Ondansetron  Airway Management Planned: Oral ETT  Additional Equipment:   Intra-op Plan:   Post-operative Plan: Extubation in OR  Informed Consent: I have reviewed the patients History and Physical, chart, labs and discussed the procedure including the risks, benefits and alternatives for the proposed anesthesia with the patient or authorized representative who has indicated his/her understanding and acceptance.       Plan Discussed with: CRNA  Anesthesia Plan Comments: (2nd PIV!  See APP note by Durel Salts, FNP)       Anesthesia Quick Evaluation

## 2021-07-08 ENCOUNTER — Telehealth: Payer: Self-pay

## 2021-07-08 NOTE — Telephone Encounter (Signed)
LM for daughter Ms Phillip Heal stating that her mother's WBC was elevated on pre-op labs at 12.8.  Dr. Daphene Jaeger 's office or the lab personal cannot go out to the home to obtain a urine specimen with a catheter.  Dr. Berline Lopes wants to see if she has a UTI so it can be treated prior to a major surgery. Would like to bring her mother in to the office on Friday 07-12-21 to do a in and out cath urine specimen. The appointment would be at 3:15 pm with a 3 pm arrival. Requested the daughter to call back to the office tomorrow  to discuss.

## 2021-07-09 NOTE — Telephone Encounter (Signed)
Spoke with the patient's daughter and scheduled an appt for 7/14

## 2021-07-11 ENCOUNTER — Inpatient Hospital Stay: Payer: Medicare Other | Attending: Gynecologic Oncology | Admitting: Gynecologic Oncology

## 2021-07-11 ENCOUNTER — Other Ambulatory Visit: Payer: Self-pay | Admitting: Gynecologic Oncology

## 2021-07-11 ENCOUNTER — Inpatient Hospital Stay: Payer: Medicare Other

## 2021-07-11 ENCOUNTER — Other Ambulatory Visit: Payer: Self-pay

## 2021-07-11 VITALS — BP 94/56 | HR 104 | Temp 97.5°F | Resp 18 | Ht 64.0 in

## 2021-07-11 DIAGNOSIS — D72829 Elevated white blood cell count, unspecified: Secondary | ICD-10-CM | POA: Diagnosis present

## 2021-07-11 DIAGNOSIS — C541 Malignant neoplasm of endometrium: Secondary | ICD-10-CM | POA: Diagnosis present

## 2021-07-11 DIAGNOSIS — R159 Full incontinence of feces: Secondary | ICD-10-CM | POA: Insufficient documentation

## 2021-07-11 DIAGNOSIS — R300 Dysuria: Secondary | ICD-10-CM

## 2021-07-11 DIAGNOSIS — D72828 Other elevated white blood cell count: Secondary | ICD-10-CM

## 2021-07-11 DIAGNOSIS — R32 Unspecified urinary incontinence: Secondary | ICD-10-CM | POA: Diagnosis not present

## 2021-07-11 LAB — CBC WITH DIFFERENTIAL (CANCER CENTER ONLY)
Abs Immature Granulocytes: 0.08 10*3/uL — ABNORMAL HIGH (ref 0.00–0.07)
Basophils Absolute: 0 10*3/uL (ref 0.0–0.1)
Basophils Relative: 0 %
Eosinophils Absolute: 0.1 10*3/uL (ref 0.0–0.5)
Eosinophils Relative: 1 %
HCT: 31.5 % — ABNORMAL LOW (ref 36.0–46.0)
Hemoglobin: 9.9 g/dL — ABNORMAL LOW (ref 12.0–15.0)
Immature Granulocytes: 1 %
Lymphocytes Relative: 13 %
Lymphs Abs: 1.8 10*3/uL (ref 0.7–4.0)
MCH: 24.6 pg — ABNORMAL LOW (ref 26.0–34.0)
MCHC: 31.4 g/dL (ref 30.0–36.0)
MCV: 78.2 fL — ABNORMAL LOW (ref 80.0–100.0)
Monocytes Absolute: 0.7 10*3/uL (ref 0.1–1.0)
Monocytes Relative: 5 %
Neutro Abs: 11.1 10*3/uL — ABNORMAL HIGH (ref 1.7–7.7)
Neutrophils Relative %: 80 %
Platelet Count: 780 10*3/uL — ABNORMAL HIGH (ref 150–400)
RBC: 4.03 MIL/uL (ref 3.87–5.11)
RDW: 23 % — ABNORMAL HIGH (ref 11.5–15.5)
WBC Count: 13.9 10*3/uL — ABNORMAL HIGH (ref 4.0–10.5)
nRBC: 0 % (ref 0.0–0.2)

## 2021-07-11 LAB — URINALYSIS, COMPLETE (UACMP) WITH MICROSCOPIC
Bilirubin Urine: NEGATIVE
Glucose, UA: NEGATIVE mg/dL
Hgb urine dipstick: NEGATIVE
Ketones, ur: NEGATIVE mg/dL
Leukocytes,Ua: NEGATIVE
Nitrite: NEGATIVE
Protein, ur: NEGATIVE mg/dL
Specific Gravity, Urine: 1.014 (ref 1.005–1.030)
pH: 5 (ref 5.0–8.0)

## 2021-07-11 NOTE — Patient Instructions (Addendum)
We rechecked a CBC lab test today to look at your white blood cell count.  Preparing for your Surgery  Plan for surgery on July 18, 2021 with Dr. Jeral Pinch at [facility] will be scheduled for a robotic assisted total laparoscopic hysterectomy (removal of the uterus and cervix), bilateral salpingo-oophorectomy (removal of both ovaries and fallopian tubes), possible sentinel lymph node biopsy, possible lymph node dissection, possible laparotomy (larger incision if needed).  YOU NEED TO STOP YOUR PLAVIX FIVE DAYS BEFORE SURGERY. WE WILL TELL YOU WHEN TO RESTART IT AFTER SURGERY.  Pre-operative Testing -You will receive a phone call from presurgical testing at [facility] to arrange for a pre-operative appointment and lab work.  -Bring your insurance card, copy of an advanced directive if applicable, medication list  -At that visit, you will be asked to sign a consent for a possible blood transfusion in case a transfusion becomes necessary during surgery.  The need for a blood transfusion is rare but having consent is a necessary part of your care.     -You should not be taking blood thinners or aspirin at least ten days prior to surgery unless instructed by your surgeon.  -Do not take supplements such as fish oil (omega 3), red yeast rice, turmeric before your surgery. You want to avoid medications with aspirin in them including headache powders such as BC or Goody's), Excedrin migraine.  Day Before Surgery at [facility] will be asked to take in a light diet the day before surgery. You will be advised you can have clear liquids up until 3 hours before your surgery.    Eat a light diet the day before surgery.  Examples including soups, broths, toast, yogurt, mashed potatoes.  AVOID GAS PRODUCING FOODS. Things to avoid include carbonated beverages (fizzy beverages, sodas), raw fruits and raw vegetables (uncooked), or beans.   If your bowels are filled with gas, your  surgeon will have difficulty visualizing your pelvic organs which increases your surgical risks.  Your role in recovery Your role is to become active as soon as directed by your doctor, while still giving yourself time to heal.  Rest when you feel tired. You will be asked to do the following in order to speed your recovery:  - Cough and breathe deeply. This helps to clear and expand your lungs and can prevent pneumonia after surgery.  - [place]. Do mild physical activity. Walking or moving your legs help your circulation and body functions return to normal. Do not try to get up or walk alone the first time after surgery.   -If you develop swelling on one leg or the other, pain in the back of your leg, redness/warmth in one of your legs, please call the office or go to the Emergency Room to have a doppler to rule out a blood clot. For shortness of breath, chest pain-seek care in the Emergency Room as soon as possible. - Actively manage your pain. Managing your pain lets you move in comfort. We will ask you to rate your pain on a scale of zero to 10. It is your responsibility to tell your doctor or nurse where and how much you hurt so your pain can be treated.  Special Considerations -If you are diabetic, you may be placed on insulin after surgery to have closer control over your blood sugars to promote healing and recovery.  This does not mean that you will be discharged on insulin.  If applicable, your oral antidiabetics will be resumed when you are tolerating a solid diet.  -Your final pathology results from surgery should be available around one week after surgery and the results will be relayed to you when available.  -Dr. Lahoma Crocker is the surgeon that assists your GYN Oncologist with surgery.  If you end up staying the night, the next day after your surgery you will either see Dr. Denman George, Dr. Berline Lopes, or Dr. Lahoma Crocker.  -FMLA forms can be faxed to  [phone] and please allow 5-7 business days for completion.  Pain Management After Surgery - We can send in a medication called tramadol for moderate to severe pain not relieved by tylenol. We will discuss further with your daughter to see if this is appropriate.  -Make sure that you have Tylenol at home to use on a regular basis after surgery for pain control.   -Review the attached handout on narcotic use and their risks and side effects.   Bowel Regimen -You will most likely be prescribed Sennakot-S to take nightly to prevent constipation especially if you are taking the narcotic pain medication intermittently.  It is important to prevent constipation and drink adequate amounts of liquids. You can stop taking this medication when you are not taking pain medication and you are back on your normal bowel routine.  Risks of Surgery Risks of surgery are low but include bleeding, infection, damage to surrounding structures, re-operation, blood clots, and very rarely death.   Blood Transfusion Information (For the consent to be signed before surgery)  We will be checking your blood type before surgery so in case of emergencies, we will know what type of blood you would need.                                            WHAT IS A BLOOD TRANSFUSION?  A transfusion is the replacement of blood or some of its parts. Blood is made up of multiple cells which provide different functions. Red blood cells carry oxygen and are used for blood loss replacement. White blood cells fight against infection. Platelets control bleeding. Plasma helps clot blood. Other blood products are available for specialized needs, such as hemophilia or other clotting disorders. BEFORE THE TRANSFUSION  Who gives blood for transfusions?  You may be able to donate blood to be used at a later date on yourself (autologous donation). Relatives can be asked to donate blood. This is generally not any safer than if you have  received blood from a stranger. The same precautions are taken to ensure safety when a relative's blood is donated. Healthy volunteers who are fully evaluated to make sure their blood is safe. This is blood bank blood. Transfusion therapy is the safest it has ever been in the practice of medicine. Before blood is taken from a donor, a complete history is taken to make sure that person has no history of diseases nor engages in risky social behavior (examples are intravenous drug use or sexual activity with multiple partners). The donor's travel history is screened to minimize risk of transmitting infections, such as malaria. The donated blood is tested for signs of infectious diseases, such as HIV and hepatitis. The blood is then tested to be sure it is compatible with you in order to minimize the chance of a transfusion reaction. If you or a relative donates blood,  this is often done in anticipation of surgery and is not appropriate for emergency situations. It takes many days to process the donated blood. RISKS AND COMPLICATIONS Although transfusion therapy is very safe and saves many lives, the main dangers of transfusion include:  Getting an infectious disease. Developing a transfusion reaction. This is an allergic reaction to something in the blood you were given. Every precaution is taken to prevent this. The decision to have a blood transfusion has been considered carefully by your caregiver before blood is given. Blood is not given unless the benefits outweigh the risks.  AFTER SURGERY INSTRUCTIONS  Return to work: 4-6 weeks if applicable  Activity: 1. Be up and out of the bed during the day.  Take a nap if needed.  You may walk up steps but be careful and use the hand rail.  Stair climbing will tire you more than you think, you may need to stop part way and rest.   2. No lifting or straining for 6 weeks over 10 pounds. No pushing, pulling, straining for 6 weeks.  3. No driving for around 1  week(s) if you were cleared to drive before surgery.  Do not drive if you are taking narcotic pain medicine and make sure that your reaction time has returned.   4. You can shower as soon as the next day after surgery. Shower daily.  Use your regular soap and water (not directly on the incision) and pat your incision(s) dry afterwards; don't rub.  No tub baths or submerging your body in water until cleared by your surgeon. If you have the soap that was given to you by pre-surgical testing that was used before surgery, you do not need to use it afterwards because this can irritate your incisions.   5. No sexual activity and nothing in the vagina for 8 weeks.  6. You may experience a small amount of clear drainage from your incisions, which is normal.  If the drainage persists, increases, or changes color please call the office.  7. Do not use creams, lotions, or ointments such as neosporin on your incisions after surgery until advised by your surgeon because they can cause removal of the dermabond glue on your incisions.    8. You may experience vaginal spotting after surgery or around the 6-8 week mark from surgery when the stitches at the top of the vagina begin to dissolve.  The spotting is normal but if you experience heavy bleeding, call our office.  9. Take Tylenol first for pain and only use narcotic pain medication for severe pain not relieved by the Tylenol.  Monitor your Tylenol intake to a max of 4,000 mg in a 24 hour period.   Diet: 1. Low sodium Heart Healthy Diet is recommended but you are cleared to resume your normal (before surgery) diet after your procedure.  2. It is safe to use a laxative, such as Miralax or Colace, if you have difficulty moving your bowels. You have been prescribed Sennakot at bedtime every evening to keep bowel movements regular and to prevent constipation.    Wound Care: 1. Keep clean and dry.  Shower daily.  Reasons to call the Doctor: Fever - Oral  temperature greater than 100.4 degrees Fahrenheit Foul-smelling vaginal discharge Difficulty urinating Nausea and vomiting Increased pain at the site of the incision that is unrelieved with pain medicine. Difficulty breathing with or without chest pain New calf pain especially if only on one side Sudden, continuing increased vaginal bleeding  with or without clots.   Contacts: For questions or concerns you should contact:  Dr. Jeral Pinch at [phone]  Joylene John, NP at [phone]  After Hours: call [phone] and have the GYN Oncologist paged/contacted (after 5 pm or on the weekends).  Messages sent via mychart are for non-urgent matters and are not responded to after hours so for urgent needs, please call the after hours number.

## 2021-07-12 DIAGNOSIS — D72829 Elevated white blood cell count, unspecified: Secondary | ICD-10-CM | POA: Insufficient documentation

## 2021-07-12 NOTE — Progress Notes (Addendum)
GYN Oncology Symptom Management  Connie Miranda is a 87 year old female with clinical stage I endometrioid endometrial adenocarcinoma who is undergoing treatment with Mirena IUD. In April 2022, she had a endometrial biopsy which returned with grade 2 endometrioid adenocarcinoma. Given these findings along with the challenge of getting to radiation appointments on a daily basis, surgery was decided upon with the surgery date of 07/18/2021 with Dr. Jeral Pinch. With the patient's pre-operative labs, her white blood cell count was 12.8. She had not had a recent CBC and her PCP stated her white blood cell count one year ago was normal. She presents today with her nurse to the clinic for further evaluation of the elevated white count to rule out infection. Plan per Dr. Berline Lopes is for repeat CBC and if white count remains elevated or has increased, to obtain a urine sample. Since the patient is incontinent of urine and stool, a urine sample would require in and out catheterization. Repeat WBC 13.9 with ANC at 11.1 in the clinic today.  The patient denies any new symptoms such as cough, fever, chills, abdominal/flank pain, dysuria (incontinence of urine and stool). She has no new symptoms since her last visit.   Exam: Alert, oriented, answering questions appropriately. In wheelchair. Lungs clear. Heart rate regular in rate and rhythm. Unable to get patient on the exam table due to the hoyer pad underneath her being upside down. It was decided to attempt the in and out cath in the wheelchair. 5 staff members required for positioning and holding the patient's legs. Dr. Denman George to the room to assist with catheterization. Patient had been incontinent of stool. Stool cleaned off. In and out cath performed by Dr. Everitt Amber without difficulty and patient tolerated well. Urine will be sent for analysis and culture.   Plan: 87 year old female to the office today for further workup of leukocytosis before surgery. Urine  sample obtained today via in and out cath. Given no respiratory symptoms and oxygen level at 100% on RA, need for chest xray on hold at this time per Dr. Denman George. Will follow up on the urine results and contact the patient's daughter. Pre-operative and post-operative instructions reviewed as well. See AVS for instructions. The patient and her nurse are advised that she needs to stop taking her plavix at least 5 days before surgery.

## 2021-07-13 LAB — URINE CULTURE: Culture: <10000 — AB

## 2021-07-15 ENCOUNTER — Telehealth: Payer: Self-pay | Admitting: Oncology

## 2021-07-15 NOTE — Telephone Encounter (Signed)
[name] (daughter) of urine culture results.  Asked how Connie Miranda is feeling and she said she is feeling fine (she has not had any reports of anything out of the ordinary).  She also checked with Connie Miranda's caregiver and she said she is doing fine - she is not running a fever and does not have any symptoms of an infection.  Also reviewed Plavix directions before surgery to hold 5 days before.  [name] is aware and said Connie Miranda has not been taking her Plavix as directed.

## 2021-07-17 ENCOUNTER — Telehealth: Payer: Self-pay

## 2021-07-17 NOTE — Telephone Encounter (Signed)
Spoke with patients daughter to check on pre-operative status.  Patient compliant with pre-operative instructions.  Reinforced NPO after midnight and clear liquids until 0730. No questions or concerns voiced.  Instructed to call for any needs.

## 2021-07-18 ENCOUNTER — Observation Stay (HOSPITAL_BASED_OUTPATIENT_CLINIC_OR_DEPARTMENT_OTHER)
Admission: RE | Admit: 2021-07-18 | Discharge: 2021-07-18 | Disposition: A | Discharge status: Home / Self Care | Payer: Medicare Other | Source: Ambulatory Visit | Attending: Gynecologic Oncology | Admitting: Gynecologic Oncology

## 2021-07-18 ENCOUNTER — Encounter (HOSPITAL_COMMUNITY): Payer: Self-pay | Admitting: Gynecologic Oncology

## 2021-07-18 ENCOUNTER — Observation Stay (HOSPITAL_COMMUNITY): Payer: Medicare Other | Admitting: Anesthesiology

## 2021-07-18 ENCOUNTER — Telehealth: Payer: Self-pay | Admitting: Oncology

## 2021-07-18 ENCOUNTER — Encounter (HOSPITAL_COMMUNITY)
Admission: RE | Disposition: A | Discharge status: Home / Self Care | Payer: Self-pay | Source: Ambulatory Visit | Attending: Gynecologic Oncology

## 2021-07-18 ENCOUNTER — Other Ambulatory Visit: Payer: Self-pay | Admitting: Gynecologic Oncology

## 2021-07-18 ENCOUNTER — Observation Stay (HOSPITAL_COMMUNITY): Payer: Medicare Other | Admitting: Emergency Medicine

## 2021-07-18 DIAGNOSIS — C7982 Secondary malignant neoplasm of genital organs: Secondary | ICD-10-CM

## 2021-07-18 DIAGNOSIS — Z20822 Contact with and (suspected) exposure to covid-19: Secondary | ICD-10-CM | POA: Insufficient documentation

## 2021-07-18 DIAGNOSIS — Z87891 Personal history of nicotine dependence: Secondary | ICD-10-CM | POA: Insufficient documentation

## 2021-07-18 DIAGNOSIS — C541 Malignant neoplasm of endometrium: Secondary | ICD-10-CM

## 2021-07-18 DIAGNOSIS — C52 Malignant neoplasm of vagina: Secondary | ICD-10-CM | POA: Insufficient documentation

## 2021-07-18 DIAGNOSIS — I82409 Acute embolism and thrombosis of unspecified deep veins of unspecified lower extremity: Secondary | ICD-10-CM | POA: Diagnosis not present

## 2021-07-18 DIAGNOSIS — I10 Essential (primary) hypertension: Secondary | ICD-10-CM | POA: Insufficient documentation

## 2021-07-18 DIAGNOSIS — I82412 Acute embolism and thrombosis of left femoral vein: Secondary | ICD-10-CM | POA: Diagnosis not present

## 2021-07-18 LAB — TYPE AND SCREEN
ABO/RH(D): A POS
Antibody Screen: NEGATIVE

## 2021-07-18 LAB — SARS CORONAVIRUS 2 BY RT PCR (HOSPITAL ORDER, PERFORMED IN ~~LOC~~ HOSPITAL LAB): SARS Coronavirus 2: NEGATIVE

## 2021-07-18 SURGERY — Surgical Case
Anesthesia: General | Site: Vagina

## 2021-07-18 MED ORDER — LACTATED RINGERS IV SOLN: INTRAVENOUS | Status: DC | PRN

## 2021-07-18 MED ORDER — FENTANYL CITRATE (PF) 100 MCG/2ML IJ SOLN
INTRAMUSCULAR | Status: DC | PRN
  Administered 2021-07-18 (×2): 50 ug via INTRAVENOUS

## 2021-07-18 MED ORDER — ROCURONIUM BROMIDE 10 MG/ML (PF) SYRINGE
PREFILLED_SYRINGE | INTRAVENOUS | Status: DC | PRN
  Administered 2021-07-18: 60 mg via INTRAVENOUS

## 2021-07-18 MED ORDER — SUGAMMADEX SODIUM 200 MG/2ML IV SOLN
INTRAVENOUS | Status: DC | PRN
  Administered 2021-07-18: 50 mg via INTRAVENOUS
  Administered 2021-07-18: 250 mg via INTRAVENOUS

## 2021-07-18 MED ORDER — CHLORHEXIDINE GLUCONATE 0.12 % MT SOLN
15.0000 mL | Freq: Once | OROMUCOSAL | Status: AC
  Administered 2021-07-18: 15 mL via OROMUCOSAL

## 2021-07-18 MED ORDER — FENTANYL CITRATE (PF) 100 MCG/2ML IJ SOLN: 25.0000 ug | INTRAMUSCULAR | Status: DC | PRN

## 2021-07-18 MED ORDER — SODIUM CHLORIDE 0.9 % IV SOLN
2.0000 g | INTRAVENOUS | Status: DC
  Filled 2021-07-18: qty 2

## 2021-07-18 MED ORDER — LACTATED RINGERS IV SOLN: INTRAVENOUS | Status: DC

## 2021-07-18 MED ORDER — PROPOFOL 10 MG/ML IV BOLUS
INTRAVENOUS | Status: AC
  Filled 2021-07-18: qty 20

## 2021-07-18 MED ORDER — STERILE WATER FOR INJECTION IJ SOLN
INTRAMUSCULAR | Status: AC
  Filled 2021-07-18: qty 10

## 2021-07-18 MED ORDER — PROPOFOL 10 MG/ML IV BOLUS
INTRAVENOUS | Status: DC | PRN
  Administered 2021-07-18: 80 mg via INTRAVENOUS

## 2021-07-18 MED ORDER — ONDANSETRON HCL 4 MG/2ML IJ SOLN: 4.0000 mg | Freq: Once | INTRAMUSCULAR | Status: DC | PRN

## 2021-07-18 MED ORDER — ONDANSETRON HCL 4 MG/2ML IJ SOLN
INTRAMUSCULAR | Status: DC | PRN
  Administered 2021-07-18: 4 mg via INTRAVENOUS

## 2021-07-18 MED ORDER — DEXAMETHASONE SODIUM PHOSPHATE 4 MG/ML IJ SOLN: 4.0000 mg | INTRAMUSCULAR | Status: DC

## 2021-07-18 MED ORDER — BUPIVACAINE HCL 0.25 % IJ SOLN
INTRAMUSCULAR | Status: AC
  Filled 2021-07-18: qty 1

## 2021-07-18 MED ORDER — FENTANYL CITRATE (PF) 250 MCG/5ML IJ SOLN
INTRAMUSCULAR | Status: AC
  Filled 2021-07-18: qty 5

## 2021-07-18 MED ORDER — ACETAMINOPHEN 500 MG PO TABS
500.0000 mg | ORAL_TABLET | ORAL | Status: AC
  Administered 2021-07-18: 500 mg via ORAL
  Filled 2021-07-18: qty 1

## 2021-07-18 MED ORDER — HEPARIN SODIUM (PORCINE) 5000 UNIT/ML IJ SOLN
5000.0000 [IU] | INTRAMUSCULAR | Status: AC
  Administered 2021-07-18: 5000 [IU] via SUBCUTANEOUS
  Filled 2021-07-18: qty 1

## 2021-07-18 SURGICAL SUPPLY — 74 items
ADH SKN CLS APL DERMABOND .7 (GAUZE/BANDAGES/DRESSINGS)
AGENT HMST KT MTR STRL THRMB (HEMOSTASIS)
APL ESCP 34 STRL LF DISP (HEMOSTASIS)
APPLICATOR SURGIFLO ENDO (HEMOSTASIS) IMPLANT
BACTOSHIELD CHG 4% 4OZ (MISCELLANEOUS) ×2
BAG COUNTER SPONGE SURGICOUNT (BAG) IMPLANT
BAG LAPAROSCOPIC 12 15 PORT 16 (BASKET) IMPLANT
BAG RETRIEVAL 12/15 (BASKET)
BAG RETRIEVAL 12/15MM (BASKET)
BAG SPEC RTRVL LRG 6X4 10 (ENDOMECHANICALS)
BAG SPNG CNTER NS LX DISP (BAG)
BAG SURGICOUNT SPONGE COUNTING (BAG)
BLADE SURG SZ10 CARB STEEL (BLADE) IMPLANT
COVER BACK TABLE 60X90IN (DRAPES) ×1 IMPLANT
COVER TIP SHEARS 8 DVNC (MISCELLANEOUS) ×1 IMPLANT
COVER TIP SHEARS 8MM DA VINCI (MISCELLANEOUS)
DECANTER SPIKE VIAL GLASS SM (MISCELLANEOUS) IMPLANT
DERMABOND ADVANCED (GAUZE/BANDAGES/DRESSINGS)
DERMABOND ADVANCED .7 DNX12 (GAUZE/BANDAGES/DRESSINGS) ×1 IMPLANT
DRAPE ARM DVNC X/XI (DISPOSABLE) ×4 IMPLANT
DRAPE COLUMN DVNC XI (DISPOSABLE) ×1 IMPLANT
DRAPE DA VINCI XI ARM (DISPOSABLE)
DRAPE DA VINCI XI COLUMN (DISPOSABLE)
DRAPE SHEET LG 3/4 BI-LAMINATE (DRAPES) ×1 IMPLANT
DRAPE SURG IRRIG POUCH 19X23 (DRAPES) ×4 IMPLANT
DRSG MEPILEX SACRM 8.7X9.8 (GAUZE/BANDAGES/DRESSINGS) ×3 IMPLANT
DRSG OPSITE POSTOP 4X6 (GAUZE/BANDAGES/DRESSINGS) IMPLANT
DRSG OPSITE POSTOP 4X8 (GAUZE/BANDAGES/DRESSINGS) IMPLANT
ELECT PENCIL ROCKER SW 15FT (MISCELLANEOUS) IMPLANT
ELECT REM PT RETURN 15FT ADLT (MISCELLANEOUS) ×1 IMPLANT
GLOVE SURG ENC MOIS LTX SZ6 (GLOVE) ×7 IMPLANT
GLOVE SURG ENC MOIS LTX SZ6.5 (GLOVE) ×2 IMPLANT
GOWN STRL REUS W/ TWL LRG LVL3 (GOWN DISPOSABLE) ×4 IMPLANT
GOWN STRL REUS W/TWL LRG LVL3 (GOWN DISPOSABLE)
HOLDER FOLEY CATH W/STRAP (MISCELLANEOUS) ×1 IMPLANT
IRRIG SUCT STRYKERFLOW 2 WTIP (MISCELLANEOUS)
IRRIGATION SUCT STRKRFLW 2 WTP (MISCELLANEOUS) ×1 IMPLANT
KIT PROCEDURE DA VINCI SI (MISCELLANEOUS)
KIT PROCEDURE DVNC SI (MISCELLANEOUS) IMPLANT
KIT TURNOVER KIT A (KITS) ×1 IMPLANT
MANIPULATOR UTERINE 4.5 ZUMI (MISCELLANEOUS) ×1 IMPLANT
NDL HYPO 21X1.5 SAFETY (NEEDLE) ×1 IMPLANT
NDL SPNL 18GX3.5 QUINCKE PK (NEEDLE) IMPLANT
NEEDLE HYPO 21X1.5 SAFETY (NEEDLE) IMPLANT
NEEDLE SPNL 18GX3.5 QUINCKE PK (NEEDLE) IMPLANT
OBTURATOR OPTICAL STANDARD 8MM (TROCAR)
OBTURATOR OPTICAL STND 8 DVNC (TROCAR)
OBTURATOR OPTICALSTD 8 DVNC (TROCAR) ×1 IMPLANT
PACK ROBOT GYN CUSTOM WL (TRAY / TRAY PROCEDURE) ×1 IMPLANT
PAD POSITIONING PINK XL (MISCELLANEOUS) ×4 IMPLANT
PORT ACCESS TROCAR AIRSEAL 12 (TROCAR) ×1 IMPLANT
PORT ACCESS TROCAR AIRSEAL 5M (TROCAR)
POUCH SPECIMEN RETRIEVAL 10MM (ENDOMECHANICALS) IMPLANT
SCRUB CHG 4% DYNA-HEX 4OZ (MISCELLANEOUS) ×2 IMPLANT
SEAL CANN UNIV 5-8 DVNC XI (MISCELLANEOUS) ×4 IMPLANT
SEAL XI 5MM-8MM UNIVERSAL (MISCELLANEOUS)
SET TRI-LUMEN FLTR TB AIRSEAL (TUBING) ×1 IMPLANT
SPONGE T-LAP 18X18 ~~LOC~~+RFID (SPONGE) IMPLANT
SURGIFLO W/THROMBIN 8M KIT (HEMOSTASIS) IMPLANT
SUT MNCRL AB 4-0 PS2 18 (SUTURE) IMPLANT
SUT PDS AB 1 TP1 96 (SUTURE) IMPLANT
SUT VIC AB 0 CT1 27 (SUTURE)
SUT VIC AB 0 CT1 27XBRD ANTBC (SUTURE) IMPLANT
SUT VIC AB 2-0 CT1 27 (SUTURE)
SUT VIC AB 2-0 CT1 TAPERPNT 27 (SUTURE) IMPLANT
SUT VIC AB 4-0 PS2 18 (SUTURE) ×2 IMPLANT
SYR 10ML LL (SYRINGE) IMPLANT
TOWEL OR NON WOVEN STRL DISP B (DISPOSABLE) ×1 IMPLANT
TRAP SPECIMEN MUCUS 40CC (MISCELLANEOUS) IMPLANT
TRAY FOLEY MTR SLVR 16FR STAT (SET/KITS/TRAYS/PACK) ×1 IMPLANT
TROCAR XCEL NON-BLD 5MMX100MML (ENDOMECHANICALS) IMPLANT
UNDERPAD 30X36 HEAVY ABSORB (UNDERPADS AND DIAPERS) ×4 IMPLANT
WATER STERILE IRR 1000ML POUR (IV SOLUTION) ×1 IMPLANT
YANKAUER SUCT BULB TIP 10FT TU (MISCELLANEOUS) IMPLANT

## 2021-07-18 NOTE — Op Note (Signed)
PATIENT: Ansley Mangiapane DATE: 07/18/21  Preoperative Diagnosis: Grade 2 endometrial cancer  Postoperative Diagnosis: at least stage IIIB endometrial cancer  Surgery: EUA, vaginal biopsy  Surgeons:  Valarie Cones MD  Anesthesia: General   Estimated blood loss: <5 ml  IVF:  see I&O flowsheet   Urine output: n/a   Complications: None apparent  Pathology: posterior vaginal wall  Operative findings: On EUA, tumor appreciated along top 1/2 of the posterior vaginal wall, replacing the cervix. ON speculum exam, tumor fills upper portion of the vagina. No rectal involvement. Uterus is enlarged, bulky, firm, minimally mobile.  Procedure: The patient was identified in the preoperative holding area. Informed consent was signed on the chart. Patient was seen history was reviewed and exam was performed.   The patient was then taken to the operating room and placed in the supine position with SCD hose on. General anesthesia was then induced without difficulty. She was then placed in the dorsolithotomy position,  Timeout was performed the patient, procedure, antibiotic, allergy, and length of procedure.   An exam was performed with above findings. Given these findings, a speculum was placed in the vagina. The vagina was cleansed with betadine x3. Several biopsies were taken of the tumor with Tischler forceps. Hemostasis was noted.   All instrument, suture, laparotomy, Ray-Tec, and needle counts were correct x2. The patient tolerated the procedure well and was taken recovery room in stable condition.   Valarie Cones, MD

## 2021-07-18 NOTE — H&P (Signed)
Gynecologic Oncology History and Physical  07/18/21  Reason for Visit: Presenting for definitive surgery for uterine cancer  Treatment History: Oncology History  Endometrial carcinoma ([place])  12/24/2019 Imaging   CT A/P: IMPRESSION: 1. The previously measured right vaginal fluid collection at 2.8 x 1 point 0 cm is not clearly demonstrated on today's study. There does appear to be a fluid collection in the left labia majora measuring 4.3 x 1.7 cm, similar to prior study. No features on today's study to suggest necrotizing fasciitis. 2. Marked apparent thickening of the endometrium. Neoplasm not excluded. Pelvic ultrasound recommended to further evaluate. 3. Collapsed gallbladder. Gallstones documented on previous cholecystostomy tube injection are not visualized on today's CT. 4.  Aortic Atherosclerois (ICD10-170.0)   12/26/2019 Imaging   Pelvic ultrasound: 1. Thickened endometrial stripe measuring up to 19.1 mm in thickness. Endometrial thickness is considered abnormal for an asymptomatic post-menopausal female. Endometrial sampling should be considered to exclude carcinoma. 2. Nonvisualization of the ovaries. No adnexal mass or abnormal free fluid within the pelvis. 3. No other acute abnormality.   01/02/2020 Initial Biopsy   D&C - Grade 2 endometrioid adenocarcinoma   01/02/2020 Initial Diagnosis   Endometrial carcinoma ([place])   02/08/2020 Treatment Plan Change   Mirena IUD placed     Interval History: Most recent biopsy showed persistent grade 2 endometrial cancer in 03/2021. Given more approximately 10month of treatment with progesterone (IUD, not a good candidate for oral progesterone), discussed extensively with the patient's daughter and the patient possible treatments options including: oral progesterone, surgery and definitive RT. All with significant risk given performance status and comorbidities. Patient's daughter wanted to move forward with surgery. Obtained  preoperative clearance.  Denies any vaginal bleeding or cramping.  Past Medical/Surgical History: Past Medical History:  Diagnosis Date   Edema    Edema of both legs 01/30/2016   Glucose-6-phosphate dehydrogenase deficiency    possible diagnosis of G6PD per patient "phosphate 6 deficiency"   Hypertension    Osteoarthritis    Pernicious anemia    Stroke ([place] 09/28/2000   couldn't move her left foot which resolved    Past Surgical History:  Procedure Laterality Date   BONE MARROW BIOPSY  12/1966   for anemia   IR PERC CHOLECYSTOSTOMY  04/08/2017   IR RADIOLOGIST EVAL & MGMT  05/20/2017   IR SINUS/FIST TUBE CHK-NON GI  06/11/2017   MULTIPLE TOOTH EXTRACTIONS     OTHER SURGICAL HISTORY     cyst from panty line removed as child    Family History  Problem Relation Age of Onset   Diabetes Brother    Diabetes Brother    Heart failure Mother 833  Heart attack Father 664   Social History   Socioeconomic History   Marital status: Single    Spouse name: Not on file   Number of children: Not on file   Years of education: Not on file   Highest education level: Not on file  Occupational History   Not on file  Tobacco Use   Smoking status: Former    Types: Cigarettes    Quit date: 12/29/1993    Years since quitting: 27.5   Smokeless tobacco: Never  Vaping Use   Vaping Use: Never used  Substance and Sexual Activity   Alcohol use: No   Drug use: No   Sexual activity: Not Currently  Other Topics Concern   Not on file  Social History Narrative   Live alone in house and uses a  cane and wheelchair to get around.  She has not been upstairs in over a year.  She does not drive, but she uses a cab or bus to go to grocery store, but usually friends and family bring her things.  Niece and nephew in [place].     Social Determinants of Health   Financial Resource Strain: Not on file  Food Insecurity: Not on file  Transportation Needs: Not on file  Physical Activity: Not on file   Stress: Not on file  Social Connections: Not on file    Current Medications:  Current Facility-Administered Medications:    ceFAZolin (ANCEF) 2 g in sodium chloride 0.9 % 100 mL IVPB, 2 g, Intravenous, On Call to OR, Cross, Melissa D, NP   dexamethasone (DECADRON) injection 4 mg, 4 mg, Intravenous, On Call to OR, Cross, Carollee Massed, NP   lactated ringers infusion, , Intravenous, Continuous, Lafonda Mosses, MD, Last Rate: 10 mL/hr at 07/18/21 0904, New Bag at 07/18/21 0904  Review of Systems: + Fatigue Denies appetite changes, fevers, chills, unexplained weight changes. Denies hearing loss, neck lumps or masses, mouth sores, ringing in ears or voice changes. Denies cough or wheezing.  Denies shortness of breath. Denies chest pain or palpitations. Denies leg swelling. Denies abdominal distention, pain, blood in stools, constipation, diarrhea, nausea, vomiting, or early satiety. Denies pain with intercourse, dysuria, frequency, hematuria or incontinence. Denies hot flashes, pelvic pain, vaginal bleeding or vaginal discharge.   Denies joint pain, back pain or muscle pain/cramps. Denies itching, rash, or wounds. Denies dizziness, headaches, numbness or seizures. Denies swollen lymph nodes or glands, denies easy bruising or bleeding. Denies anxiety, depression, confusion, or decreased concentration.  Physical Exam: BP (!) 142/57   Pulse 77   Temp 98.2 F (36.8 C) (Oral)   Resp 16   Ht 5' 4"  (1.626 m)   Wt 165 lb (74.8 kg)   SpO2 97%   BMI 28.32 kg/m  General: Alert, oriented, no acute distress. HEENT: Normocephalic, atraumatic, sclera anicteric. Chest: Unlabored breathing on room air. Abdomen: Obese, soft, nontender.  Normoactive bowel sounds.  No masses or hepatosplenomegaly appreciated.   Extremities: Significantly limited lower extremity mobility due to pain.  Sores and hyperpigmented lesions covering patient's lower legs bilaterally.   Laboratory & Radiologic  Studies: CBC    Component Value Date/Time   WBC 13.9 (H) 07/11/2021 1132   WBC 12.8 (H) 07/03/2021 1340   RBC 4.03 07/11/2021 1132   HGB 9.9 (L) 07/11/2021 1132   HCT 31.5 (L) 07/11/2021 1132   PLT 780 (H) 07/11/2021 1132   MCV 78.2 (L) 07/11/2021 1132   MCH 24.6 (L) 07/11/2021 1132   MCHC 31.4 07/11/2021 1132   RDW 23.0 (H) 07/11/2021 1132   LYMPHSABS 1.8 07/11/2021 1132   MONOABS 0.7 07/11/2021 1132   EOSABS 0.1 07/11/2021 1132   BASOSABS 0.0 07/11/2021 1132   CMP Latest Ref Rng & Units 07/03/2021 01/12/2020 01/11/2020  Glucose 70 - 99 mg/dL 166(H) 100(H) 95  BUN 8 - 23 mg/dL 15 8 8   Creatinine 0.44 - 1.00 mg/dL 0.76 0.71 0.62  Sodium 135 - 145 mmol/L 139 140 140  Potassium 3.5 - 5.1 mmol/L 3.3(L) 3.4(L) 3.9  Chloride 98 - 111 mmol/L 107 105 107  CO2 22 - 32 mmol/L 24 27 26   Calcium 8.9 - 10.3 mg/dL 8.8(L) 8.8(L) 9.0  Total Protein 6.5 - 8.1 g/dL 8.0 - -  Total Bilirubin 0.3 - 1.2 mg/dL 0.7 - -  Alkaline Phos 38 -  126 U/L 97 - -  AST 15 - 41 U/L 19 - -  ALT 0 - 44 U/L 8 - -    Assessment & Plan: Connie Miranda is a 87 y.o. woman with clinical stage I endometrioid endometrial adenocarcinoma who is undergoing treatment with Mirena IUD with persistence of grade 2 EMCA despite 14 months of treatment.  Presents today for definitive surgery. Will attempt SLN biopsy. If patient doesn't map, then will likely forgo lymph node assessment due to increased morbidity with additional surgical time and anesthesia.   Jeral Pinch, MD  Division of Gynecologic Oncology  Department of Obstetrics and Gynecology  [facility]

## 2021-07-18 NOTE — Telephone Encounter (Signed)
Called Connie Miranda with CT appointment for tomorrow at 3:30 with arrival at 1:30 to drink the water based contrast. Also advised Connie Miranda needs to be NPO 4 hours before the scan.   She verbalized understanding and agreement of instructions and apt time.

## 2021-07-18 NOTE — Anesthesia Procedure Notes (Signed)
Procedure Name: Intubation Date/Time: 07/18/2021 11:19 AM Performed by: Gean Maidens, CRNA Pre-anesthesia Checklist: Patient identified, Emergency Drugs available, Suction available, Patient being monitored and Timeout performed Patient Re-evaluated:Patient Re-evaluated prior to induction Oxygen Delivery Method: Circle system utilized Preoxygenation: Pre-oxygenation with 100% oxygen Induction Type: IV induction Ventilation: Mask ventilation without difficulty Laryngoscope Size: Mac and 3 Grade View: Grade I Tube size: 7.0 mm Number of attempts: 1 Airway Equipment and Method: Stylet Placement Confirmation: ETT inserted through vocal cords under direct vision, positive ETCO2 and breath sounds checked- equal and bilateral Secured at: 21 cm Tube secured with: Tape Dental Injury: Teeth and Oropharynx as per pre-operative assessment

## 2021-07-18 NOTE — Transfer of Care (Signed)
Immediate Anesthesia Transfer of Care Note  Patient: Connie Miranda  Procedure(s) Performed: VAGINAL BIOPSY (Vagina )  Patient Location: PACU  Anesthesia Type:General  Level of Consciousness: awake, oriented and patient cooperative  Airway & Oxygen Therapy: Patient Spontanous Breathing and Patient connected to face mask oxygen  Post-op Assessment: Report given to RN and Post -op Vital signs reviewed and stable  Post vital signs: Reviewed and stable  Last Vitals:  Vitals Value Taken Time  BP 152/88 07/18/21 1152  Temp    Pulse 92 07/18/21 1157  Resp 19 07/18/21 1200  SpO2 100 % 07/18/21 1157  Vitals shown include unvalidated device data.  Last Pain:  Vitals:   07/18/21 0833  TempSrc:   PainSc: 9       Patients Stated Pain Goal: 8 (14/44/58 4835)  Complications: No notable events documented.

## 2021-07-18 NOTE — Anesthesia Postprocedure Evaluation (Signed)
Anesthesia Post Note  Patient: Nyima Vanacker [place]  Procedure(s) Performed: VAGINAL BIOPSY (Vagina )     Patient location during evaluation: PACU Anesthesia Type: General Level of consciousness: awake and alert Pain management: pain level controlled Vital Signs Assessment: post-procedure vital signs reviewed and stable Respiratory status: spontaneous breathing, nonlabored ventilation, respiratory function stable and patient connected to nasal cannula oxygen Cardiovascular status: blood pressure returned to baseline and stable Postop Assessment: no apparent nausea or vomiting Anesthetic complications: no   No notable events documented.  Last Vitals:  Vitals:   07/18/21 1230 07/18/21 1245  BP:  (!) 141/69  Pulse: 88 92  Resp: 12 14  Temp: 36.6 C   SpO2: 97% 96%    Last Pain:  Vitals:   07/18/21 1245  TempSrc:   PainSc: 0-No pain                 Catalina Gravel

## 2021-07-18 NOTE — Discharge Instructions (Addendum)
07/18/2021  No new instructions in terms of wound care, incisions, medications, or activity level.  OK to restart all home medications.  I have ordered a CT and Elmo Putt will help get this scheduled.  I will call Monday after our tumor conference to discuss with you treatment options moving forward.   Reasons to call the Doctor: Fever - Oral temperature greater than 100.4 degrees Fahrenheit Foul-smelling vaginal discharge Difficulty urinating Nausea and vomiting Increased pain at the site of the incision that is unrelieved with pain medicine. Difficulty breathing with or without chest pain New calf pain especially if only on one side Sudden, continuing increased vaginal bleeding with or without clots.   Follow-up: 1. Phone visit on Monday.   Contacts: For questions or concerns you should contact:  Dr. Jeral Pinch at [phone] After hours and on week-ends call [phone] and ask to speak to the physician on call for Gynecologic Oncology

## 2021-07-18 NOTE — Interval H&P Note (Signed)
History and Physical Interval Note:  07/18/2021 10:03 AM  Connie Miranda  has presented today for surgery, with the diagnosis of ENDOMETRIAL CANCER.  The various methods of treatment have been discussed with the patient and family. After consideration of risks, benefits and other options for treatment, the patient has consented to  Procedure(s): XI ROBOTIC ASSISTED TOTAL HYSTERECTOMY WITH BILATERAL SALPINGO OOPHORECTOMY, POSSIBLE LAPAROTOMY (N/A) POSSIBLE SENTINEL NODE BIOPSY (N/A) POSSIBLE PELVIC LYMPH NODE DISSECTION (N/A) as a surgical intervention.  The patient's history has been reviewed, patient examined, no change in status, stable for surgery.  I have reviewed the patient's chart and labs.  Questions were answered to the patient's satisfaction.     Lafonda Mosses

## 2021-07-19 ENCOUNTER — Observation Stay (HOSPITAL_COMMUNITY)
Admission: RE | Admit: 2021-07-19 | Discharge: 2021-07-19 | Disposition: A | Discharge status: Home / Self Care | Payer: Medicare Other | Source: Ambulatory Visit | Attending: Gynecologic Oncology | Admitting: Gynecologic Oncology

## 2021-07-19 ENCOUNTER — Telehealth: Payer: Self-pay

## 2021-07-19 ENCOUNTER — Inpatient Hospital Stay (HOSPITAL_COMMUNITY)
Admission: EM | Admit: 2021-07-19 | Discharge: 2021-07-24 | DRG: 988 | Disposition: A | Discharge status: Hospice | Payer: Medicare Other | Attending: Internal Medicine | Admitting: Internal Medicine

## 2021-07-19 ENCOUNTER — Other Ambulatory Visit: Payer: Self-pay

## 2021-07-19 ENCOUNTER — Encounter (HOSPITAL_COMMUNITY): Payer: Self-pay | Admitting: Gynecologic Oncology

## 2021-07-19 DIAGNOSIS — Z7902 Long term (current) use of antithrombotics/antiplatelets: Secondary | ICD-10-CM | POA: Diagnosis not present

## 2021-07-19 DIAGNOSIS — S72002A Fracture of unspecified part of neck of left femur, initial encounter for closed fracture: Secondary | ICD-10-CM | POA: Diagnosis not present

## 2021-07-19 DIAGNOSIS — I480 Paroxysmal atrial fibrillation: Secondary | ICD-10-CM | POA: Diagnosis present

## 2021-07-19 DIAGNOSIS — R54 Age-related physical debility: Secondary | ICD-10-CM | POA: Diagnosis present

## 2021-07-19 DIAGNOSIS — Z8673 Personal history of transient ischemic attack (TIA), and cerebral infarction without residual deficits: Secondary | ICD-10-CM | POA: Diagnosis not present

## 2021-07-19 DIAGNOSIS — I10 Essential (primary) hypertension: Secondary | ICD-10-CM | POA: Diagnosis present

## 2021-07-19 DIAGNOSIS — I82409 Acute embolism and thrombosis of unspecified deep veins of unspecified lower extremity: Secondary | ICD-10-CM | POA: Diagnosis present

## 2021-07-19 DIAGNOSIS — I82412 Acute embolism and thrombosis of left femoral vein: Secondary | ICD-10-CM

## 2021-07-19 DIAGNOSIS — C541 Malignant neoplasm of endometrium: Secondary | ICD-10-CM | POA: Diagnosis present

## 2021-07-19 DIAGNOSIS — I4891 Unspecified atrial fibrillation: Secondary | ICD-10-CM

## 2021-07-19 DIAGNOSIS — Z66 Do not resuscitate: Secondary | ICD-10-CM | POA: Diagnosis present

## 2021-07-19 DIAGNOSIS — Z7189 Other specified counseling: Secondary | ICD-10-CM | POA: Diagnosis not present

## 2021-07-19 DIAGNOSIS — Z7401 Bed confinement status: Secondary | ICD-10-CM | POA: Diagnosis not present

## 2021-07-19 DIAGNOSIS — Z880 Allergy status to penicillin: Secondary | ICD-10-CM

## 2021-07-19 DIAGNOSIS — M84552A Pathological fracture in neoplastic disease, left femur, initial encounter for fracture: Secondary | ICD-10-CM | POA: Diagnosis present

## 2021-07-19 DIAGNOSIS — I743 Embolism and thrombosis of arteries of the lower extremities: Secondary | ICD-10-CM | POA: Diagnosis present

## 2021-07-19 DIAGNOSIS — E876 Hypokalemia: Secondary | ICD-10-CM | POA: Diagnosis present

## 2021-07-19 DIAGNOSIS — Z833 Family history of diabetes mellitus: Secondary | ICD-10-CM

## 2021-07-19 DIAGNOSIS — Z20822 Contact with and (suspected) exposure to covid-19: Secondary | ICD-10-CM | POA: Diagnosis present

## 2021-07-19 DIAGNOSIS — D510 Vitamin B12 deficiency anemia due to intrinsic factor deficiency: Secondary | ICD-10-CM | POA: Diagnosis present

## 2021-07-19 DIAGNOSIS — Z87891 Personal history of nicotine dependence: Secondary | ICD-10-CM | POA: Diagnosis not present

## 2021-07-19 DIAGNOSIS — Z515 Encounter for palliative care: Secondary | ICD-10-CM | POA: Diagnosis not present

## 2021-07-19 DIAGNOSIS — Z8249 Family history of ischemic heart disease and other diseases of the circulatory system: Secondary | ICD-10-CM | POA: Diagnosis not present

## 2021-07-19 DIAGNOSIS — Z882 Allergy status to sulfonamides status: Secondary | ICD-10-CM

## 2021-07-19 DIAGNOSIS — I824Y2 Acute embolism and thrombosis of unspecified deep veins of left proximal lower extremity: Secondary | ICD-10-CM | POA: Diagnosis not present

## 2021-07-19 LAB — COMPREHENSIVE METABOLIC PANEL
ALT: 11 U/L (ref 0–44)
AST: 19 U/L (ref 15–41)
Albumin: 2.6 g/dL — ABNORMAL LOW (ref 3.5–5.0)
Alkaline Phosphatase: 84 U/L (ref 38–126)
Anion gap: 10 (ref 5–15)
BUN: 15 mg/dL (ref 8–23)
CO2: 23 mmol/L (ref 22–32)
Calcium: 8.9 mg/dL (ref 8.9–10.3)
Chloride: 101 mmol/L (ref 98–111)
Creatinine, Ser: 0.38 mg/dL — ABNORMAL LOW (ref 0.44–1.00)
GFR, Estimated: >60 mL/min (ref >60)
Glucose, Bld: 114 mg/dL — ABNORMAL HIGH (ref 70–99)
Potassium: 3 mmol/L — ABNORMAL LOW (ref 3.5–5.1)
Sodium: 134 mmol/L — ABNORMAL LOW (ref 135–145)
Total Bilirubin: 0.5 mg/dL (ref 0.3–1.2)
Total Protein: 7.4 g/dL (ref 6.5–8.1)

## 2021-07-19 LAB — RESP PANEL BY RT-PCR (FLU A&B, COVID) ARPGX2
Influenza A by PCR: NEGATIVE
Influenza B by PCR: NEGATIVE
SARS Coronavirus 2 by RT PCR: NEGATIVE

## 2021-07-19 LAB — APTT: aPTT: 29 seconds (ref 24–36)

## 2021-07-19 LAB — CBC WITH DIFFERENTIAL/PLATELET
Abs Immature Granulocytes: 0.12 10*3/uL — ABNORMAL HIGH (ref 0.00–0.07)
Basophils Absolute: 0 10*3/uL (ref 0.0–0.1)
Basophils Relative: 0 %
Eosinophils Absolute: 0 10*3/uL (ref 0.0–0.5)
Eosinophils Relative: 0 %
HCT: 28.5 % — ABNORMAL LOW (ref 36.0–46.0)
Hemoglobin: 8.8 g/dL — ABNORMAL LOW (ref 12.0–15.0)
Immature Granulocytes: 1 %
Lymphocytes Relative: 8 %
Lymphs Abs: 1.2 10*3/uL (ref 0.7–4.0)
MCH: 25.5 pg — ABNORMAL LOW (ref 26.0–34.0)
MCHC: 30.9 g/dL (ref 30.0–36.0)
MCV: 82.6 fL (ref 80.0–100.0)
Monocytes Absolute: 0.7 10*3/uL (ref 0.1–1.0)
Monocytes Relative: 4 %
Neutro Abs: 14.2 10*3/uL — ABNORMAL HIGH (ref 1.7–7.7)
Neutrophils Relative %: 87 %
Platelets: 586 10*3/uL — ABNORMAL HIGH (ref 150–400)
RBC: 3.45 MIL/uL — ABNORMAL LOW (ref 3.87–5.11)
RDW: 24.4 % — ABNORMAL HIGH (ref 11.5–15.5)
WBC: 16.2 10*3/uL — ABNORMAL HIGH (ref 4.0–10.5)
nRBC: 0 % (ref 0.0–0.2)

## 2021-07-19 LAB — PROTIME-INR
INR: 0.9 (ref 0.8–1.2)
Prothrombin Time: 12.6 seconds (ref 11.4–15.2)

## 2021-07-19 MED ORDER — IOHEXOL 350 MG/ML SOLN
80.0000 mL | Freq: Once | INTRAVENOUS | Status: AC | PRN
  Administered 2021-07-19: 80 mL via INTRAVENOUS

## 2021-07-19 MED ORDER — HEPARIN BOLUS VIA INFUSION
1000.0000 [IU] | Freq: Once | INTRAVENOUS | Status: AC
  Administered 2021-07-19: 1000 [IU] via INTRAVENOUS
  Filled 2021-07-19: qty 1000

## 2021-07-19 MED ORDER — IOHEXOL 9 MG/ML PO SOLN
500.0000 mL | ORAL | Status: AC
Start: 2021-07-19 — End: 2021-07-19

## 2021-07-19 MED ORDER — IOHEXOL 9 MG/ML PO SOLN
ORAL | Status: AC
  Filled 2021-07-19: qty 1000

## 2021-07-19 MED ORDER — HEPARIN (PORCINE) 25000 UT/250ML-% IV SOLN
1000.0000 [IU]/h | INTRAVENOUS | Status: DC
  Administered 2021-07-19: 1000 [IU]/h via INTRAVENOUS
  Filled 2021-07-19: qty 250

## 2021-07-19 NOTE — ED Triage Notes (Signed)
Pt BIB EMS from home. Pt was seen earlier today and has a left hip fracture and blood clot. Pt was here for for evaluation of fracture. A&O x4. Pt endorses left hip pain.

## 2021-07-19 NOTE — Progress Notes (Signed)
ANTICOAGULATION CONSULT NOTE - Initial Consult  Pharmacy Consult for Heparin Indication: DVT  Allergies  Allergen Reactions   Asa [Aspirin] Other (See Comments)    Doctor told her not to take due to type anemia   Phosphate Other (See Comments)    Anything containing phosphate 6. Unknown reaction   Codeine Other (See Comments)    Made her too sleepy   Penicillins Itching and Swelling    Tolerates keflex  Did it involve swelling of the face/tongue/throat, SOB, or low BP? unknown Did it involve sudden or severe rash/hives, skin peeling, or any reaction on the inside of your mouth or nose? unknown Did you need to seek medical attention at a hospital or doctor's office? unknown When did it last happen?   years ago.    If all above answers are "NO", may proceed with cephalosporin use.    Sodium Phosphate Other (See Comments)    unknown   Sulfa Antibiotics Hives    Patient Measurements:   Heparin dosing weight: 70kg   Vital Signs: Temp: 98.7 F (37.1 C) (07/22 2033) Temp Source: Oral (07/22 2033) BP: 138/69 (07/22 2139) Pulse Rate: 98 (07/22 2139)  Labs: No results for input(s): HGB, HCT, PLT, APTT, LABPROT, INR, HEPARINUNFRC, HEPRLOWMOCWT, CREATININE, CKTOTAL, CKMB, TROPONINIHS in the last 72 hours.  Estimated Creatinine Clearance: 50 mL/min (by C-G formula based on SCr of 0.76 mg/dL).   Medical History: Past Medical History:  Diagnosis Date   Edema    Edema of both legs 01/30/2016   Glucose-6-phosphate dehydrogenase deficiency    possible diagnosis of G6PD per patient "phosphate 6 deficiency"   Hypertension    Osteoarthritis    Pernicious anemia    Stroke ([place]) 09/28/2000   couldn't move her left foot which resolved    Medications:  Infusions:   Assessment: 87 yo M with hx endometrial CA s/p vaginal biopsy on 7/21.  Follow-up outpatient pelvic CT today revealed DVT of L femoral & external iliac veins and hip fracture.  Not on anticoagulation PTA. Baseline  Labs: CBC- Hg low at 8.8 which appears to be ~ patient's baseline, pltc elevated at 586.  No active bleeding noted.  Coag labs pending.   Goal of Therapy:  Heparin level 0.3-0.7 units/ml Monitor platelets by anticoagulation protocol: Yes   Plan:  Heparin 1000 units IV x1 (half heparin bolus since pt s/p procedure yesterday) then continue heparin infusion at 1000 units/hr Check heparin level 8h after infusion started Daily heparin level & CBC while on heparin Monitor closely for s/sx of bleeding  Netta Cedars PharmD 07/19/2021,10:38 PM

## 2021-07-19 NOTE — Telephone Encounter (Signed)
Daughter Genelle Gather states that her mother is doing well.  She was very tired after the procedure yesterday and slept. Eating and drinking small amounts today. She is going to a CT scan today at 3:30 pm.NPO after 11:30 today. She is urinating. Had BM yesterday. No BM today thus far. She did resume the Plavix. Daughter aware of post op appointments as well as the office number [phone] and after hours number [phone] to call if she has any questions or concerns

## 2021-07-19 NOTE — ED Provider Notes (Signed)
[facility] DEPT Provider Note   CSN: [id number] Arrival date & time: 07/19/21  1949     History Chief Complaint  Patient presents with   Hip Pain   DVT    Connie Miranda is a 87 y.o. female.  The history is provided by the patient and medical records.  Hip Pain ALAYZIAH TANGEMAN is a 87 y.o. female who presents to the Emergency Department complaining of abnormal imaging. She presents the emergency department for evaluation of abnormal CT scan that was performed today.  No reports of falls. She denies any fevers, chest pain, shortness of breath. She had an endometrial biopsy performed yesterday. She had an outpatient CT abdomen pelvis with contrast performed today that demonstrates acute DVT of the left femoral vein as well as acute femoral neck fracture. Patient reports left hip pain that started today. Additional history available from the patient's daughter after her initial assessment. Daughter states that the patient lives with a caregiver and the caregiver reports that the patient has been complaining about pain when she is being rolled in bed over the last week.     Past Medical History:  Diagnosis Date   Edema    Edema of both legs 01/30/2016   Glucose-6-phosphate dehydrogenase deficiency    possible diagnosis of G6PD per patient "phosphate 6 deficiency"   Hypertension    Osteoarthritis    Pernicious anemia    Stroke ([place]) 09/28/2000   couldn't move her left foot which resolved    Patient Active Problem List   Diagnosis Date Noted   DVT (deep venous thrombosis) ([place]) 07/19/2021   Endometrial cancer ([place]) 07/18/2021   Leukocytosis 07/12/2021   Pressure injury of skin 01/06/2020   Endometrial carcinoma ([place]) 01/03/2020   Thickened endometrium    Shock circulatory ([place]) 12/24/2019   Chronic diastolic CHF (congestive heart failure) ([place]) 06/10/2018   Sepsis ([place]) 06/10/2018   Acute respiratory failure with hypoxia ([place]) 06/10/2018    Hypokalemia 06/10/2018   Acute bronchitis 06/10/2018   Lower extremity weakness 02/11/2016   Difficulty walking 04/08/2013   Weakness of both legs 04/08/2013   Morbid obesity ([place]) 04/08/2013   AKI (acute kidney injury) ([place]) 01/28/2013   Hyperkalemia 01/28/2013   Fall 01/28/2013   Nausea 01/28/2013   Lymphedema 10/16/2012   Cellulitis 10/16/2012   Diarrhea 10/13/2012   Cellulitis of left lower leg 10/12/2012   Anemia 10/12/2012   Abdominal pain 10/12/2012   Hip pain 10/12/2012   Hypertension    Edema    Pernicious anemia    Glucose-6-phosphate dehydrogenase deficiency    Osteoarthritis    Stroke ([place]) 09/28/2000    Past Surgical History:  Procedure Laterality Date   BONE MARROW BIOPSY  12/1966   for anemia   IR PERC CHOLECYSTOSTOMY  04/08/2017   IR RADIOLOGIST EVAL & MGMT  05/20/2017   IR SINUS/FIST TUBE CHK-NON GI  06/11/2017   MULTIPLE TOOTH EXTRACTIONS     OTHER SURGICAL HISTORY     cyst from panty line removed as child   VAGINAL DELIVERY N/A 07/18/2021   Procedure: VAGINAL BIOPSY;  Surgeon: Lafonda Mosses, MD;  Location: [facility] ORS;  Service: Gynecology;  Laterality: N/A;     OB History   No obstetric history on file.     Family History  Problem Relation Age of Onset   Diabetes Brother    Diabetes Brother    Heart failure Mother 20   Heart attack Father 74    Social History  Tobacco Use   Smoking status: Former    Types: Cigarettes    Quit date: 12/29/1993    Years since quitting: 27.5   Smokeless tobacco: Never  Vaping Use   Vaping Use: Never used  Substance Use Topics   Alcohol use: No   Drug use: No    Home Medications Prior to Admission medications   Medication Sig Start Date End Date Taking? Authorizing Provider  acetaminophen (TYLENOL) 500 MG tablet Take 500 mg by mouth every 6 (six) hours as needed for moderate pain.    [provider]  amLODipine (NORVASC) 10 MG tablet Take 1 tablet (10 mg total) by mouth daily. 03/24/13    Lauree Chandler, NP  Calcium Carbonate-Vitamin D (CALCIUM + D PO) Take 1 tablet by mouth 3 (three) times a week.    [provider]  clopidogrel (PLAVIX) 75 MG tablet Take 1 tablet (75 mg total) by mouth daily. Patient taking differently: Take 75 mg by mouth daily in the afternoon. 03/24/13   Lauree Chandler, NP  cyanocobalamin (,VITAMIN B-12,) 1000 MCG/ML injection Inject 1 mL (1,000 mcg total) into the muscle every 30 (thirty) days. 03/24/13   Lauree Chandler, NP  furosemide (LASIX) 40 MG tablet Take 3 tablets (120 mg total) by mouth daily. Patient taking differently: Take 40 mg by mouth in the morning, at noon, and at bedtime. 01/12/20 06/29/23  Donne Hazel, MD  isosorbide mononitrate (IMDUR) 120 MG 24 hr tablet Take 120 mg by mouth daily in the afternoon. 03/19/19   [provider]  levonorgestrel (MIRENA) 20 MCG/24HR IUD 1 Intra Uterine Device (1 each total) by Intrauterine route once for 1 dose. To be placed in the office 01/26/20 06/29/23  Joylene John D, NP  liver oil-zinc oxide (DESITIN) 40 % ointment Apply 1 application topically as needed for irritation (barrier protection (buttocks)).    [provider]  Multiple Vitamin (MULTIVITAMIN WITH MINERALS) TABS Take 1 tablet by mouth daily. Patient taking differently: Take 1 tablet by mouth 3 (three) times a week. 03/24/13   Lauree Chandler, NP  Polyethyl Glycol-Propyl Glycol (LUBRICANT EYE DROPS) 0.4-0.3 % SOLN Place 1 drop into both eyes 3 (three) times daily as needed (dry/irritated eyes).    [provider]  polyethylene glycol (MIRALAX / GLYCOLAX) 17 g packet Take 17 g by mouth 3 (three) times a week.    [provider]  white petrolatum (VASELINE) GEL Apply 1 application topically as needed for dry skin (lymphedema).    [provider]    Allergies    Asa [aspirin], Phosphate, Codeine, Penicillins, Sodium phosphate, and Sulfa antibiotics  Review of Systems   Review of  Systems  All other systems reviewed and are negative.  Physical Exam Updated Vital Signs BP 115/65   Pulse (!) 102   Temp 98.7 F (37.1 C) (Oral)   Resp 18   SpO2 100%   Physical Exam Vitals and nursing note reviewed.  Constitutional:      Appearance: She is well-developed.  HENT:     Head: Normocephalic and atraumatic.  Cardiovascular:     Rate and Rhythm: Normal rate. Rhythm irregular.  Pulmonary:     Effort: Pulmonary effort is normal. No respiratory distress.  Abdominal:     Palpations: Abdomen is soft.     Tenderness: There is no abdominal tenderness. There is no guarding or rebound.  Musculoskeletal:     Comments: 2+ DP pulses bilaterally, TTP over left hip. Edema to  bilateral lower extremities  Skin:    General: Skin is warm and dry.  Neurological:     Mental Status: She is alert and oriented to person, place, and time.  Psychiatric:        Behavior: Behavior normal.    ED Results / Procedures / Treatments   Labs (all labs ordered are listed, but only abnormal results are displayed) Labs Reviewed  CBC WITH DIFFERENTIAL/PLATELET - Abnormal; Notable for the following components:      Result Value   WBC 16.2 (*)    RBC 3.45 (*)    Hemoglobin 8.8 (*)    HCT 28.5 (*)    MCH 25.5 (*)    RDW 24.4 (*)    Platelets 586 (*)    Neutro Abs 14.2 (*)    Abs Immature Granulocytes 0.12 (*)    All other components within normal limits  COMPREHENSIVE METABOLIC PANEL - Abnormal; Notable for the following components:   Sodium 134 (*)    Potassium 3.0 (*)    Glucose, Bld 114 (*)    Creatinine, Ser 0.38 (*)    Albumin 2.6 (*)    All other components within normal limits  RESP PANEL BY RT-PCR (FLU A&B, COVID) ARPGX2  PROTIME-INR  APTT  CBC  HEPARIN LEVEL (UNFRACTIONATED)    EKG EKG Interpretation  Date/Time:  Friday July 19 2021 21:36:19 EDT Ventricular Rate:  98 PR Interval:  188 QRS Duration: 99 QT Interval:  331 QTC Calculation: 434 R Axis:   -19 Text  Interpretation: Atrial fibrillation Ventricular premature complex Borderline left axis deviation Borderline repolarization abnormality Interpretation limited secondary to artifact Confirmed by Quintella Reichert [phone]) on 07/19/2021 9:55:46 PM  Radiology CT Abdomen Pelvis W Contrast  Addendum Date: 07/19/2021   ADDENDUM REPORT: 07/19/2021 17:23 ADDENDUM: The original report was by Dr. Van Clines. The following addendum is by Dr. Van Clines: Critical Value/emergent results were called by telephone at the time of interpretation on 07/19/2021 at 5:13 pm to provider Dr. Denman George, who verbally acknowledged these results. Electronically Signed   By: Van Clines M.D.   On: 07/19/2021 17:23   Result Date: 07/19/2021 CLINICAL DATA:  Endometrial adenocarcinoma.  Pelvic pain. EXAM: CT ABDOMEN AND PELVIS WITH CONTRAST TECHNIQUE: Multidetector CT imaging of the abdomen and pelvis was performed using the standard protocol following bolus administration of intravenous contrast. CONTRAST:  52m OMNIPAQUE IOHEXOL 350 MG/ML SOLN COMPARISON:  01/05/2020 FINDINGS: Despite efforts by the technologist and patient, motion artifact is present on today's exam and could not be eliminated. This reduces exam sensitivity and specificity. Lower chest: The lower margin of a newly apparent right middle lobe pulmonary nodule measures 1.1 by 0.9 cm on image 1 series 7. This nodule is not fully included on today's exam and the non included portion may actually be larger. Contrast filled distal esophagus with patulous GE junction and small type 1 hiatal hernia, appearance compatible with gastroesophageal reflux. Right coronary artery and descending thoracic aortic atherosclerotic calcification. Hepatobiliary: Punctate calcifications in the liver compatible with old granulomatous disease. Contracted gallbladder, blurred by motion artifact. Pancreas: Unremarkable Spleen: Punctate calcifications from old granulomatous disease.  Adrenals/Urinary Tract: Hypodense right kidney upper pole lesion 0.7 by 0.6 cm on image 32 series 2 anteriorly, nonspecific. Similar sized left kidney upper pole lesion on image 26 series 2. These are likely cysts but technically too small to characterize. Urinary bladder and adrenal glands appear unremarkable. Stomach/Bowel: Small type 1 hiatal hernia. Distended rectum 9.4 cm transverse by 9.2 cm  anterior-posterior, potentially from fecal impaction, with faint stranding in the perirectal space such that stercoral colitis cannot be excluded. Scattered diverticula of the descending colon. Vascular/Lymphatic: Aortoiliac atherosclerotic vascular disease. Right external iliac node 1.3 cm in short axis on image 61 series 2, formerly not readily apparent. Filling defect in the left common femoral artery and left external iliac artery compatible with acute deep vein thrombosis. Reproductive: Enlarging heterogeneous mass centrally in the uterus, with an IUD extending in the mass and a small amount of gas in the heterogeneously enhancing mass. This currently measures about 10.8 by 6.6 by 11.2 cm (volume = 420 cm^3) with suspicion for local extension beyond the myometrial margin of the uterus for example superiorly on image 49 series 6. This has substantially enlarged from prior measurement of 6.5 by 4.0 by 6.4 cm (volume = 87 cm^3) on 01/05/2020. Other: No supplemental non-categorized findings. Musculoskeletal: Acute or subacute left femoral neck fracture with left hip effusion. Striking bony demineralization. Lumbar spondylosis and degenerative disc disease with acquired interbody fusions at multiple levels. Foraminal impingement observed bilaterally at L4-5 and L5-S1. IMPRESSION: 1. Acute deep vein thrombosis in the left common femoral vein and adjacent external iliac vein. 2. Acute or subacute left femoral neck fracture, moderately displaced. Left hip effusion. Bony demineralization. 3. The endometrial mass has enlarged  to nearly 5 times the size shown on 01/04/2021, likely with some early extra uterine extension. There is mild right external iliac adenopathy as well as a newly appreciable right middle lobe pulmonary nodule measuring at least 1.1 cm in diameter. 4. Prominent stool in the rectal vault suggesting impaction. Faint perirectal stranding may reflect stercoral colitis. 5. Gastroesophageal reflux.  Small hiatal hernia. 6. Other imaging findings of potential clinical significance: Aortic Atherosclerosis (ICD10-I70.0). Coronary atherosclerosis. Old granulomatous disease. Radiology assistant personnel have been notified to put me in telephone contact with the referring physician or the referring physician's clinical representative in order to discuss these findings. Once this communication is established I will issue an addendum to this report for documentation purposes. Electronically Signed: By: Van Clines M.D. On: 07/19/2021 17:10    Procedures Procedures   Medications Ordered in ED Medications  heparin ADULT infusion 100 units/mL (25000 units/244m) (1,000 Units/hr Intravenous New Bag/Given 07/19/21 2341)  heparin bolus via infusion 1,000 Units (1,000 Units Intravenous Bolus from Bag 07/19/21 2336)    ED Course  I have reviewed the triage vital signs and the nursing notes.  Pertinent labs & imaging results that were available during my care of the patient were reviewed by me and considered in my medical decision making (see chart for details).    MDM Rules/Calculators/A&P                          patient referred to the emergency department for CT abnormalities concerning for femoral neck fracture as well as DVT. Patient has been complaining of pain to the left hip, no reports of traumas. Discussed with Dr. ALucia Gaskinswith orthopedics, who recommends plain films. He will see the patient in consult. She was started on heparin for her DVT. She is also in new onset a fib. She is rate controlled. Medicine  consulted for admission.    Final Clinical Impression(s) / ED Diagnoses Final diagnoses:  Closed fracture of neck of left femur, initial encounter ([place]  Acute deep vein thrombosis (DVT) of femoral vein of left lower extremity ([place]    Rx / DC Orders ED Discharge Orders  None        Quintella Reichert, MD 07/20/21 0002

## 2021-07-20 ENCOUNTER — Inpatient Hospital Stay (HOSPITAL_COMMUNITY): Payer: Medicare Other

## 2021-07-20 DIAGNOSIS — S72002A Fracture of unspecified part of neck of left femur, initial encounter for closed fracture: Secondary | ICD-10-CM | POA: Diagnosis not present

## 2021-07-20 DIAGNOSIS — I824Y2 Acute embolism and thrombosis of unspecified deep veins of left proximal lower extremity: Secondary | ICD-10-CM

## 2021-07-20 DIAGNOSIS — I10 Essential (primary) hypertension: Secondary | ICD-10-CM

## 2021-07-20 DIAGNOSIS — C541 Malignant neoplasm of endometrium: Secondary | ICD-10-CM | POA: Diagnosis not present

## 2021-07-20 DIAGNOSIS — E876 Hypokalemia: Secondary | ICD-10-CM | POA: Diagnosis not present

## 2021-07-20 DIAGNOSIS — I82412 Acute embolism and thrombosis of left femoral vein: Secondary | ICD-10-CM | POA: Diagnosis not present

## 2021-07-20 DIAGNOSIS — I4891 Unspecified atrial fibrillation: Secondary | ICD-10-CM

## 2021-07-20 LAB — COMPREHENSIVE METABOLIC PANEL
ALT: 10 U/L (ref 0–44)
AST: 16 U/L (ref 15–41)
Albumin: 2.3 g/dL — ABNORMAL LOW (ref 3.5–5.0)
Alkaline Phosphatase: 87 U/L (ref 38–126)
Anion gap: 8 (ref 5–15)
BUN: 14 mg/dL (ref 8–23)
CO2: 23 mmol/L (ref 22–32)
Calcium: 8.8 mg/dL — ABNORMAL LOW (ref 8.9–10.3)
Chloride: 103 mmol/L (ref 98–111)
Creatinine, Ser: 0.49 mg/dL (ref 0.44–1.00)
GFR, Estimated: >60 mL/min (ref >60)
Glucose, Bld: 123 mg/dL — ABNORMAL HIGH (ref 70–99)
Potassium: 3.1 mmol/L — ABNORMAL LOW (ref 3.5–5.1)
Sodium: 134 mmol/L — ABNORMAL LOW (ref 135–145)
Total Bilirubin: 0.8 mg/dL (ref 0.3–1.2)
Total Protein: 7 g/dL (ref 6.5–8.1)

## 2021-07-20 LAB — CBC
HCT: 26.9 % — ABNORMAL LOW (ref 36.0–46.0)
Hemoglobin: 8.4 g/dL — ABNORMAL LOW (ref 12.0–15.0)
MCH: 25.1 pg — ABNORMAL LOW (ref 26.0–34.0)
MCHC: 31.2 g/dL (ref 30.0–36.0)
MCV: 80.5 fL (ref 80.0–100.0)
Platelets: 568 10*3/uL — ABNORMAL HIGH (ref 150–400)
RBC: 3.34 MIL/uL — ABNORMAL LOW (ref 3.87–5.11)
RDW: 24 % — ABNORMAL HIGH (ref 11.5–15.5)
WBC: 13.7 10*3/uL — ABNORMAL HIGH (ref 4.0–10.5)
nRBC: 0 % (ref 0.0–0.2)

## 2021-07-20 LAB — FERRITIN: Ferritin: 65 ng/mL (ref 11–307)

## 2021-07-20 LAB — IRON AND TIBC
Iron: 24 ug/dL — ABNORMAL LOW (ref 28–170)
Saturation Ratios: 12 % (ref 10.4–31.8)
TIBC: 208 ug/dL — ABNORMAL LOW (ref 250–450)
UIBC: 184 ug/dL

## 2021-07-20 LAB — HEPARIN LEVEL (UNFRACTIONATED)
Heparin Unfractionated: <0.1 IU/mL — ABNORMAL LOW (ref 0.30–0.70)
Heparin Unfractionated: <0.1 IU/mL — ABNORMAL LOW (ref 0.30–0.70)
Heparin Unfractionated: 0.69 IU/mL (ref 0.30–0.70)

## 2021-07-20 LAB — PHOSPHORUS: Phosphorus: 2 mg/dL — ABNORMAL LOW (ref 2.5–4.6)

## 2021-07-20 LAB — MAGNESIUM
Magnesium: 1.8 mg/dL (ref 1.7–2.4)
Magnesium: 1.7 mg/dL (ref 1.7–2.4)

## 2021-07-20 MED ORDER — ACETAMINOPHEN 650 MG RE SUPP: 650.0000 mg | Freq: Four times a day (QID) | RECTAL | Status: DC | PRN

## 2021-07-20 MED ORDER — POTASSIUM CHLORIDE CRYS ER 20 MEQ PO TBCR
20.0000 meq | EXTENDED_RELEASE_TABLET | Freq: Two times a day (BID) | ORAL | Status: AC
  Administered 2021-07-20 – 2021-07-21 (×2): 20 meq via ORAL
  Filled 2021-07-20: qty 1

## 2021-07-20 MED ORDER — POTASSIUM CHLORIDE CRYS ER 20 MEQ PO TBCR
20.0000 meq | EXTENDED_RELEASE_TABLET | Freq: Two times a day (BID) | ORAL | Status: AC
  Administered 2021-07-20 (×2): 20 meq via ORAL
  Filled 2021-07-20 (×2): qty 1

## 2021-07-20 MED ORDER — ACETAMINOPHEN 325 MG PO TABS
650.0000 mg | ORAL_TABLET | Freq: Four times a day (QID) | ORAL | Status: DC | PRN
  Administered 2021-07-21 – 2021-07-23 (×4): 650 mg via ORAL
  Filled 2021-07-20 (×4): qty 2

## 2021-07-20 MED ORDER — MAGNESIUM OXIDE -MG SUPPLEMENT 400 (240 MG) MG PO TABS
800.0000 mg | ORAL_TABLET | Freq: Two times a day (BID) | ORAL | Status: DC
  Administered 2021-07-20 – 2021-07-24 (×9): 800 mg via ORAL
  Filled 2021-07-20 (×9): qty 2

## 2021-07-20 MED ORDER — ALBUTEROL SULFATE (2.5 MG/3ML) 0.083% IN NEBU: 2.5000 mg | INHALATION_SOLUTION | Freq: Four times a day (QID) | RESPIRATORY_TRACT | Status: DC | PRN

## 2021-07-20 MED ORDER — ONDANSETRON HCL 4 MG/2ML IJ SOLN: 4.0000 mg | Freq: Four times a day (QID) | INTRAMUSCULAR | Status: DC | PRN

## 2021-07-20 MED ORDER — HEPARIN BOLUS VIA INFUSION
2000.0000 [IU] | Freq: Once | INTRAVENOUS | Status: AC
  Administered 2021-07-20: 2000 [IU] via INTRAVENOUS
  Filled 2021-07-20: qty 2000

## 2021-07-20 MED ORDER — HEPARIN (PORCINE) 25000 UT/250ML-% IV SOLN
1100.0000 [IU]/h | INTRAVENOUS | Status: DC
  Administered 2021-07-20: 1200 [IU]/h via INTRAVENOUS
  Filled 2021-07-20 (×3): qty 250

## 2021-07-20 MED ORDER — ONDANSETRON HCL 4 MG PO TABS: 4.0000 mg | ORAL_TABLET | Freq: Four times a day (QID) | ORAL | Status: DC | PRN

## 2021-07-20 NOTE — ED Notes (Signed)
Two staff members attempted to straight stick for Heparin level, unable to obtain. Lab order placed so level can be drawn on the floor.

## 2021-07-20 NOTE — ED Notes (Signed)
Pt second IV infiltrated while drawing lab. IV team consult placed and floor notified.

## 2021-07-20 NOTE — Consult Note (Signed)
Reason for Consult: Left femoral neck fracture Referring Physician: [facility]  Connie Miranda is an 87 y.o. female.  HPI: Patient presented to the emergency department after abnormal CT scan.  Incidentally on the CT scan there was a left femoral neck fracture.  Patient is bedbound.  She has a history of endometrial cancer that is enlarging.  Orthopedics was consulted.  On questioning patient denies falls.  She denies injury overall.  She has noticed some pain in her left hip region recently with rolling and movement.  She has not ambulated in quite some time.  Past Medical History:  Diagnosis Date   Edema    Edema of both legs 01/30/2016   Glucose-6-phosphate dehydrogenase deficiency    possible diagnosis of G6PD per patient "phosphate 6 deficiency"   Hypertension    Osteoarthritis    Pernicious anemia    Stroke ([place]) 09/28/2000   couldn't move her left foot which resolved    Past Surgical History:  Procedure Laterality Date   BONE MARROW BIOPSY  12/1966   for anemia   IR PERC CHOLECYSTOSTOMY  04/08/2017   IR RADIOLOGIST EVAL & MGMT  05/20/2017   IR SINUS/FIST TUBE CHK-NON GI  06/11/2017   MULTIPLE TOOTH EXTRACTIONS     OTHER SURGICAL HISTORY     cyst from panty line removed as child   VAGINAL DELIVERY N/A 07/18/2021   Procedure: VAGINAL BIOPSY;  Surgeon: Lafonda Mosses, MD;  Location: [facility];  Service: Gynecology;  Laterality: N/A;    Family History  Problem Relation Age of Onset   Diabetes Brother    Diabetes Brother    Heart failure Mother 88   Heart attack Father 45    Social History:  reports that she quit smoking about 27 years ago. Her smoking use included cigarettes. She has never used smokeless tobacco. She reports that she does not drink alcohol and does not use drugs.  Allergies:  Allergies  Allergen Reactions   Asa [Aspirin] Other (See Comments)    Doctor told her not to take due to type anemia   Phosphate Other (See Comments)     Anything containing phosphate 6. Unknown reaction   Codeine Other (See Comments)    Made her too sleepy   Penicillins Itching and Swelling    Tolerates keflex  Did it involve swelling of the face/tongue/throat, SOB, or low BP? unknown Did it involve sudden or severe rash/hives, skin peeling, or any reaction on the inside of your mouth or nose? unknown Did you need to seek medical attention at a hospital or doctor's office? unknown When did it last happen?   years ago.    If all above answers are "NO", may proceed with cephalosporin use.    Sodium Phosphate Other (See Comments)    unknown   Sulfa Antibiotics Hives    Medications: I have reviewed the patient's current medications.  Results for orders placed or performed during the hospital encounter of 07/19/21 (from the past 48 hour(s))  CBC with Differential     Status: Abnormal   Collection Time: 07/19/21  8:01 PM  Result Value Ref Range   WBC 16.2 (H) 4.0 - 10.5 K/uL   RBC 3.45 (L) 3.87 - 5.11 MIL/uL   Hemoglobin 8.8 (L) 12.0 - 15.0 g/dL   HCT 28.5 (L) 36.0 - 46.0 %   MCV 82.6 80.0 - 100.0 fL   MCH 25.5 (L) 26.0 - 34.0 pg   MCHC 30.9 30.0 - 36.0 g/dL  RDW 24.4 (H) 11.5 - 15.5 %   Platelets 586 (H) 150 - 400 K/uL   nRBC 0.0 0.0 - 0.2 %   Neutrophils Relative % 87 %   Neutro Abs 14.2 (H) 1.7 - 7.7 K/uL   Lymphocytes Relative 8 %   Lymphs Abs 1.2 0.7 - 4.0 K/uL   Monocytes Relative 4 %   Monocytes Absolute 0.7 0.1 - 1.0 K/uL   Eosinophils Relative 0 %   Eosinophils Absolute 0.0 0.0 - 0.5 K/uL   Basophils Relative 0 %   Basophils Absolute 0.0 0.0 - 0.1 K/uL   Smear Review PLATELET CLUMPS OBSERVED    Immature Granulocytes 1 %   Abs Immature Granulocytes 0.12 (H) 0.00 - 0.07 K/uL    Comment: Performed at [facility], [place] [address], [place], [place] [address]  Resp Panel by RT-PCR (Flu A&B, Covid) Nasopharyngeal Swab     Status: None   Collection Time: 07/19/21  8:01 PM   Specimen: Nasopharyngeal  Swab; Nasopharyngeal(NP) swabs in vial transport medium  Result Value Ref Range   SARS Coronavirus 2 by RT PCR NEGATIVE NEGATIVE    Comment: (NOTE) SARS-CoV-2 target nucleic acids are NOT DETECTED.  The SARS-CoV-2 RNA is generally detectable in upper respiratory specimens during the acute phase of infection. The lowest concentration of SARS-CoV-2 viral copies this assay can detect is 138 copies/mL. A negative result does not preclude SARS-Cov-2 infection and should not be used as the sole basis for treatment or other patient management decisions. A negative result may occur with  improper specimen collection/handling, submission of specimen other than nasopharyngeal swab, presence of viral mutation(s) within the areas targeted by this assay, and inadequate number of viral copies(<138 copies/mL). A negative result must be combined with clinical observations, patient history, and epidemiological information. The expected result is Negative.  Fact Sheet for Patients:  EntrepreneurPulse.com.au  Fact Sheet for Healthcare Providers:  IncredibleEmployment.be  This test is no t yet approved or cleared by the [place] FDA and  has been authorized for detection and/or diagnosis of SARS-CoV-2 by FDA under an Emergency Use Authorization (EUA). This EUA will remain  in effect (meaning this test can be used) for the duration of the COVID-19 declaration under Section 564(b)(1) of the Act, 21 U.S.C.section 360bbb-3(b)(1), unless the authorization is terminated  or revoked sooner.       Influenza A by PCR NEGATIVE NEGATIVE   Influenza B by PCR NEGATIVE NEGATIVE    Comment: (NOTE) The Xpert Xpress SARS-CoV-2/FLU/RSV plus assay is intended as an aid in the diagnosis of influenza from Nasopharyngeal swab specimens and should not be used as a sole basis for treatment. Nasal washings and aspirates are unacceptable for Xpert Xpress  SARS-CoV-2/FLU/RSV testing.  Fact Sheet for Patients: EntrepreneurPulse.com.au  Fact Sheet for Healthcare Providers: IncredibleEmployment.be  This test is not yet approved or cleared by the [place] FDA and has been authorized for detection and/or diagnosis of SARS-CoV-2 by FDA under an Emergency Use Authorization (EUA). This EUA will remain in effect (meaning this test can be used) for the duration of the COVID-19 declaration under Section 564(b)(1) of the Act, 21 U.S.C. section 360bbb-3(b)(1), unless the authorization is terminated or revoked.  Performed at [facility], [place] [address] [place], [place] [address]   Protime-INR     Status: None   Collection Time: 07/19/21  8:01 PM  Result Value Ref Range   Prothrombin Time 12.6 11.4 - 15.2 seconds   INR 0.9  0.8 - 1.2    Comment: (NOTE) INR goal varies based on device and disease states. Performed at [facility], [place] [address], [place], [place] [address]   APTT     Status: None   Collection Time: 07/19/21  8:01 PM  Result Value Ref Range   aPTT 29 24 - 36 seconds    Comment: Performed at [facility], [place] [address] [place], [place] [address]  Comprehensive metabolic panel     Status: Abnormal   Collection Time: 07/19/21  8:18 PM  Result Value Ref Range   Sodium 134 (L) 135 - 145 mmol/L   Potassium 3.0 (L) 3.5 - 5.1 mmol/L   Chloride 101 98 - 111 mmol/L   CO2 23 22 - 32 mmol/L   Glucose, Bld 114 (H) 70 - 99 mg/dL    Comment: Glucose reference range applies only to samples taken after fasting for at least 8 hours.   BUN 15 8 - 23 mg/dL   Creatinine, Ser 0.38 (L) 0.44 - 1.00 mg/dL   Calcium 8.9 8.9 - 10.3 mg/dL   Total Protein 7.4 6.5 - 8.1 g/dL   Albumin 2.6 (L) 3.5 - 5.0 g/dL   AST 19 15 - 41 U/L   ALT 11 0 - 44 U/L   Alkaline Phosphatase 84 38 - 126 U/L   Total Bilirubin 0.5 0.3 - 1.2 mg/dL   GFR, Estimated >60 >60  mL/min    Comment: (NOTE) Calculated using the CKD-EPI Creatinine Equation (2021)    Anion gap 10 5 - 15    Comment: Performed at [facility], [place] [address] [place], [place] [address]  Magnesium     Status: None   Collection Time: 07/19/21  8:18 PM  Result Value Ref Range   Magnesium 1.8 1.7 - 2.4 mg/dL    Comment: Performed at [facility], [place] [address], [place], [place] [address]  CBC     Status: Abnormal   Collection Time: 07/20/21  3:20 AM  Result Value Ref Range   WBC 13.7 (H) 4.0 - 10.5 K/uL   RBC 3.34 (L) 3.87 - 5.11 MIL/uL   Hemoglobin 8.4 (L) 12.0 - 15.0 g/dL   HCT 26.9 (L) 36.0 - 46.0 %   MCV 80.5 80.0 - 100.0 fL   MCH 25.1 (L) 26.0 - 34.0 pg   MCHC 31.2 30.0 - 36.0 g/dL   RDW 24.0 (H) 11.5 - 15.5 %   Platelets 568 (H) 150 - 400 K/uL   nRBC 0.0 0.0 - 0.2 %    Comment: Performed at [facility], [place] [address] [place], [place] [address]  Comprehensive metabolic panel     Status: Abnormal   Collection Time: 07/20/21  3:20 AM  Result Value Ref Range   Sodium 134 (L) 135 - 145 mmol/L   Potassium 3.1 (L) 3.5 - 5.1 mmol/L   Chloride 103 98 - 111 mmol/L   CO2 23 22 - 32 mmol/L   Glucose, Bld 123 (H) 70 - 99 mg/dL    Comment: Glucose reference range applies only to samples taken after fasting for at least 8 hours.   BUN 14 8 - 23 mg/dL   Creatinine, Ser 0.49 0.44 - 1.00 mg/dL   Calcium 8.8 (L) 8.9 - 10.3 mg/dL   Total Protein 7.0 6.5 - 8.1 g/dL   Albumin 2.3 (L) 3.5 - 5.0 g/dL   AST 16 15 - 41 U/L   ALT 10  0 - 44 U/L   Alkaline Phosphatase 87 38 - 126 U/L   Total Bilirubin 0.8 0.3 - 1.2 mg/dL   GFR, Estimated >60 >60 mL/min    Comment: (NOTE) Calculated using the CKD-EPI Creatinine Equation (2021)    Anion gap 8 5 - 15    Comment: Performed at [facility], [place] [address] [place], [place] [address]  Magnesium     Status: None   Collection Time: 07/20/21  3:20 AM  Result Value Ref  Range   Magnesium 1.7 1.7 - 2.4 mg/dL    Comment: Performed at [facility], [place] [address] [place], [place] [address]  Phosphorus     Status: Abnormal   Collection Time: 07/20/21  3:20 AM  Result Value Ref Range   Phosphorus 2.0 (L) 2.5 - 4.6 mg/dL    Comment: Performed at [facility], [place] [address] [place], [place] [address]    CT Abdomen Pelvis W Contrast  Addendum Date: 07/19/2021   ADDENDUM REPORT: 07/19/2021 17:23 ADDENDUM: The original report was by Dr. Van Clines. The following addendum is by Dr. Van Clines: Critical Value/emergent results were called by telephone at the time of interpretation on 07/19/2021 at 5:13 pm to provider Dr. Denman George, who verbally acknowledged these results. Electronically Signed   By: Van Clines M.D.   On: 07/19/2021 17:23   Result Date: 07/19/2021 CLINICAL DATA:  Endometrial adenocarcinoma.  Pelvic pain. EXAM: CT ABDOMEN AND PELVIS WITH CONTRAST TECHNIQUE: Multidetector CT imaging of the abdomen and pelvis was performed using the standard protocol following bolus administration of intravenous contrast. CONTRAST:  91m OMNIPAQUE IOHEXOL 350 MG/ML SOLN COMPARISON:  01/05/2020 FINDINGS: Despite efforts by the technologist and patient, motion artifact is present on today's exam and could not be eliminated. This reduces exam sensitivity and specificity. Lower chest: The lower margin of a newly apparent right middle lobe pulmonary nodule measures 1.1 by 0.9 cm on image 1 series 7. This nodule is not fully included on today's exam and the non included portion may actually be larger. Contrast filled distal esophagus with patulous GE junction and small type 1 hiatal hernia, appearance compatible with gastroesophageal reflux. Right coronary artery and descending thoracic aortic atherosclerotic calcification. Hepatobiliary: Punctate calcifications in the liver compatible with old granulomatous disease. Contracted  gallbladder, blurred by motion artifact. Pancreas: Unremarkable Spleen: Punctate calcifications from old granulomatous disease. Adrenals/Urinary Tract: Hypodense right kidney upper pole lesion 0.7 by 0.6 cm on image 32 series 2 anteriorly, nonspecific. Similar sized left kidney upper pole lesion on image 26 series 2. These are likely cysts but technically too small to characterize. Urinary bladder and adrenal glands appear unremarkable. Stomach/Bowel: Small type 1 hiatal hernia. Distended rectum 9.4 cm transverse by 9.2 cm anterior-posterior, potentially from fecal impaction, with faint stranding in the perirectal space such that stercoral colitis cannot be excluded. Scattered diverticula of the descending colon. Vascular/Lymphatic: Aortoiliac atherosclerotic vascular disease. Right external iliac node 1.3 cm in short axis on image 61 series 2, formerly not readily apparent. Filling defect in the left common femoral artery and left external iliac artery compatible with acute deep vein thrombosis. Reproductive: Enlarging heterogeneous mass centrally in the uterus, with an IUD extending in the mass and a small amount of gas in the heterogeneously enhancing mass. This currently measures about 10.8 by 6.6 by 11.2 cm (volume = 420 cm^3) with suspicion for local extension beyond the myometrial margin of the uterus for example superiorly on image 49 series 6.  This has substantially enlarged from prior measurement of 6.5 by 4.0 by 6.4 cm (volume = 87 cm^3) on 01/05/2020. Other: No supplemental non-categorized findings. Musculoskeletal: Acute or subacute left femoral neck fracture with left hip effusion. Striking bony demineralization. Lumbar spondylosis and degenerative disc disease with acquired interbody fusions at multiple levels. Foraminal impingement observed bilaterally at L4-5 and L5-S1. IMPRESSION: 1. Acute deep vein thrombosis in the left common femoral vein and adjacent external iliac vein. 2. Acute or subacute  left femoral neck fracture, moderately displaced. Left hip effusion. Bony demineralization. 3. The endometrial mass has enlarged to nearly 5 times the size shown on 01/04/2021, likely with some early extra uterine extension. There is mild right external iliac adenopathy as well as a newly appreciable right middle lobe pulmonary nodule measuring at least 1.1 cm in diameter. 4. Prominent stool in the rectal vault suggesting impaction. Faint perirectal stranding may reflect stercoral colitis. 5. Gastroesophageal reflux.  Small hiatal hernia. 6. Other imaging findings of potential clinical significance: Aortic Atherosclerosis (ICD10-I70.0). Coronary atherosclerosis. Old granulomatous disease. Radiology assistant personnel have been notified to put me in telephone contact with the referring physician or the referring physician's clinical representative in order to discuss these findings. Once this communication is established I will issue an addendum to this report for documentation purposes. Electronically Signed: By: Van Clines M.D. On: 07/19/2021 17:10   DG Hip Unilat W or Wo Pelvis 2-3 Views Left  Result Date: 07/20/2021 CLINICAL DATA:  Atraumatic left hip pain. EXAM: DG HIP (WITH OR WITHOUT PELVIS) 2-3V LEFT COMPARISON:  October 11, 2012 FINDINGS: An acute fracture deformity is seen extending through the neck of the proximal left femur. Approximately 1/2 shaft width dorsal displacement of the distal fracture site is noted. There is no evidence of dislocation. Of incidental note is the presence of an extensive amount of contrast seen throughout the large bowel and urinary bladder. IMPRESSION: Acute fracture of the proximal left femur. Electronically Signed   By: Virgina Norfolk M.D.   On: 07/20/2021 00:40    Review of Systems  Constitutional: Negative.   HENT: Negative.    Eyes: Negative.   Respiratory: Negative.    Gastrointestinal: Negative.   Musculoskeletal:        Left hip pain  Skin:         Skin changes to legs  Neurological: Negative.   Psychiatric/Behavioral: Negative.    Blood pressure (!) 133/57, pulse 90, temperature 98.1 F (36.7 C), temperature source Oral, resp. rate 14, SpO2 95 %. Physical Exam HENT:     Head: Atraumatic.     Mouth/Throat:     Mouth: Mucous membranes are dry.  Eyes:     Extraocular Movements: Extraocular movements intact.  Cardiovascular:     Rate and Rhythm: Normal rate.  Pulmonary:     Effort: Pulmonary effort is normal.  Abdominal:     Palpations: Abdomen is soft.  Musculoskeletal:     Cervical back: Neck supple.     Comments: Bilateral legs externally rotated.  Significant swelling to bilateral legs distal to the knees.  There is skin changes distally consistent with swelling and venous stasis type changes.  She is able to weakly dorsiflex and plantarflex ankles bilaterally.  Pain with manipulation of the left hip minimally.  No evidence of upper extremity injury  Neurological:     General: No focal deficit present.     Mental Status: She is alert.  Psychiatric:        Mood and Affect:  Mood normal.    Assessment/Plan: Patient has a left femoral neck fracture of unknown chronicity.  The CT scan and x-rays are read as an acute fracture however there are some sclerotic features about the proximal end of the femur and the fractured fragment of the neck.  This may represent a subacute fracture.  She does have some discomfort.  She is nonambulatory and therefore we will plan to treat this nonsurgically.  This was discussed with the patient and she is in agreement.  She is okay for weightbearing on the right lower extremity if needed but should maintain nonweightbearing on the left.  Okay for slide transfers.  Okay for out of bed to a chair.  DVT prophylaxis per primary team.  Patient will follow up in the office in 2 to 4 weeks for repeat x-rays.  Erle Crocker 07/20/2021, 9:55 AM

## 2021-07-20 NOTE — ED Notes (Signed)
Lunch served: intake good 90%

## 2021-07-20 NOTE — ED Notes (Signed)
No changes noted when checked frequently.  Heparin drip continues at 12 ML/hr

## 2021-07-20 NOTE — H&P (Signed)
History and Physical  Patient Name: Connie Miranda     TGG:[id number]    DOB: Sep 03, 1933    DOA: 07/19/2021 PCP: Clovia Cuff, MD  Patient coming from: Home  Chief Complaint: Abnormal imaging, femur fracture, acute DVT    HPI: Connie Miranda is a 87 y.o. female, with PMH of endometrial carcinoma, hypertension, previous stroke, bedbound, who presented to the ER on 07/20/2021 with abnormal imaging, DVT noted.  Patient had a CT scan performed today that demonstrated acute DVT of the left femoral vein, acute left femoral neck fracture, along with enlarging endometrial mass.  Patient had a endometrial biopsy on 07/18/2021.  Apparently there are plans for more invasive, larger surgery, however per daughter, it was noted the cancer was more extensive, thus this was abandoned and there were plans to discuss with tumor board in the next few days.  At baseline, patient is bedbound and has a caregiver with her at all times.  Denies any recent falls.  However daughter does note she had some pain with rolling better over the past week.  Family history of blood clots.  Not on anticoagulation, but was on Plavix for previous CVA.    ED course: -Vitals on admission: Afebrile, heart rate 93, respiratory rate 16, blood pressure 134/57, maintaining sats on room air -Labs on initial presentation: Sodium 134, potassium 3, chloride 101, bicarb 23, glucose 114, BUN 15, creatinine 0.38, albumin 2.6, WBC 16.2, hemoglobin 8.8, platelets 586 -Imaging obtained on admission: maging demonstrated acute DVT in left femoral vein, enlarging of endometrial mass, and acute versus subacute left hip fracture -In the ED the patient was started on heparin drip.  Orthopedics was contacted and will evaluate in the morning, and the hospitalist service was contacted for further evaluation and management.     ROS: A complete and thorough 12 point review of systems obtained, negative listed in HPI.     Past Medical History:   Diagnosis Date   Edema    Edema of both legs 01/30/2016   Glucose-6-phosphate dehydrogenase deficiency    possible diagnosis of G6PD per patient "phosphate 6 deficiency"   Hypertension    Osteoarthritis    Pernicious anemia    Stroke ([place]) 09/28/2000   couldn't move her left foot which resolved    Past Surgical History:  Procedure Laterality Date   BONE MARROW BIOPSY  12/1966   for anemia   IR PERC CHOLECYSTOSTOMY  04/08/2017   IR RADIOLOGIST EVAL & MGMT  05/20/2017   IR SINUS/FIST TUBE CHK-NON GI  06/11/2017   MULTIPLE TOOTH EXTRACTIONS     OTHER SURGICAL HISTORY     cyst from panty line removed as child   VAGINAL DELIVERY N/A 07/18/2021   Procedure: VAGINAL BIOPSY;  Surgeon: Lafonda Mosses, MD;  Location: [facility];  Service: Gynecology;  Laterality: N/A;    Social History: Patient lives with caregiver.  The patient is bedbound.  Non smoker.  Allergies  Allergen Reactions   Asa [Aspirin] Other (See Comments)    Doctor told her not to take due to type anemia   Phosphate Other (See Comments)    Anything containing phosphate 6. Unknown reaction   Codeine Other (See Comments)    Made her too sleepy   Penicillins Itching and Swelling    Tolerates keflex  Did it involve swelling of the face/tongue/throat, SOB, or low BP? unknown Did it involve sudden or severe rash/hives, skin peeling, or any reaction on the inside of your mouth or nose?  unknown Did you need to seek medical attention at a hospital or doctor's office? unknown When did it last happen?   years ago.    If all above answers are "NO", may proceed with cephalosporin use.    Sodium Phosphate Other (See Comments)    unknown   Sulfa Antibiotics Hives    Family history: family history includes Diabetes in her brother and brother; Heart attack (age of onset: 68) in her father; Heart failure (age of onset: 29) in her mother.  Prior to Admission medications   Medication Sig Start Date End Date Taking? Authorizing  Provider  acetaminophen (TYLENOL) 500 MG tablet Take 500 mg by mouth every 6 (six) hours as needed for moderate pain.    [provider]  amLODipine (NORVASC) 10 MG tablet Take 1 tablet (10 mg total) by mouth daily. 03/24/13   Lauree Chandler, NP  Calcium Carbonate-Vitamin D (CALCIUM + D PO) Take 1 tablet by mouth 3 (three) times a week.    [provider]  clopidogrel (PLAVIX) 75 MG tablet Take 1 tablet (75 mg total) by mouth daily. Patient taking differently: Take 75 mg by mouth daily in the afternoon. 03/24/13   Lauree Chandler, NP  cyanocobalamin (,VITAMIN B-12,) 1000 MCG/ML injection Inject 1 mL (1,000 mcg total) into the muscle every 30 (thirty) days. 03/24/13   Lauree Chandler, NP  furosemide (LASIX) 40 MG tablet Take 3 tablets (120 mg total) by mouth daily. Patient taking differently: Take 40 mg by mouth in the morning, at noon, and at bedtime. 01/12/20 06/29/23  Donne Hazel, MD  isosorbide mononitrate (IMDUR) 120 MG 24 hr tablet Take 120 mg by mouth daily in the afternoon. 03/19/19   [provider]  levonorgestrel (MIRENA) 20 MCG/24HR IUD 1 Intra Uterine Device (1 each total) by Intrauterine route once for 1 dose. To be placed in the office 01/26/20 06/29/23  Joylene John D, NP  liver oil-zinc oxide (DESITIN) 40 % ointment Apply 1 application topically as needed for irritation (barrier protection (buttocks)).    [provider]  Multiple Vitamin (MULTIVITAMIN WITH MINERALS) TABS Take 1 tablet by mouth daily. Patient taking differently: Take 1 tablet by mouth 3 (three) times a week. 03/24/13   Lauree Chandler, NP  Polyethyl Glycol-Propyl Glycol (LUBRICANT EYE DROPS) 0.4-0.3 % SOLN Place 1 drop into both eyes 3 (three) times daily as needed (dry/irritated eyes).    [provider]  polyethylene glycol (MIRALAX / GLYCOLAX) 17 g packet Take 17 g by mouth 3 (three) times a week.    [provider]  white petrolatum (VASELINE) GEL Apply  1 application topically as needed for dry skin (lymphedema).    [provider]       Physical Exam: BP 135/72   Pulse 70   Temp 98.7 F (37.1 C) (Oral)   Resp 18   SpO2 98%   General appearance: Frail-appearing, elderly female Eyes: Anicteric, conjunctiva pink, lids and lashes normal. PERRL.    ENT: No nasal deformity, discharge, epistaxis.  Hearing intact. OP moist without lesions.   Neck: No neck masses.  Trachea midline.  No thyromegaly/tenderness. Lymph: No cervical or supraclavicular lymphadenopathy. Cardiac: Irregularly irregular, nl S1-S2, no murmurs appreciated.  Trace/1+ LE edema.  Radial and pedal pulses 2+ and symmetric. Respiratory: Normal respiratory rate and rhythm.  CTAB without rales or wheezes. Abdomen: Abdomen soft.  No tenderness with palpation. No ascites, distension, hepatosplenomegaly.   MSK: Chronic venous stasis changes of the  lower extremity. Neuro: Cranial nerves 2 through 12 grossly intact.  Sensation intact to light touch. Speech is fluent.  Marland Kitchen    Psych: Sensorium intact and responding to questions, attention normal.  Behavior appropriate.  Pleasantly confused at times    Labs on Admission:  I have personally reviewed following labs and imaging studies: CBC: Recent Labs  Lab 07/19/21 2001  WBC 16.2*  NEUTROABS 14.2*  HGB 8.8*  HCT 28.5*  MCV 82.6  PLT 827*   Basic Metabolic Panel: Recent Labs  Lab 07/19/21 2018  NA 134*  K 3.0*  CL 101  CO2 23  GLUCOSE 114*  BUN 15  CREATININE 0.38*  CALCIUM 8.9   GFR: Estimated Creatinine Clearance: 50 mL/min (A) (by C-G formula based on SCr of 0.38 mg/dL (L)).  Liver Function Tests: Recent Labs  Lab 07/19/21 2018  AST 19  ALT 11  ALKPHOS 84  BILITOT 0.5  PROT 7.4  ALBUMIN 2.6*   No results for input(s): LIPASE, AMYLASE in the last 168 hours. No results for input(s): AMMONIA in the last 168 hours. Coagulation Profile: Recent Labs  Lab 07/19/21 2001  INR 0.9   Cardiac  Enzymes: No results for input(s): CKTOTAL, CKMB, CKMBINDEX, TROPONINI in the last 168 hours. BNP (last 3 results) No results for input(s): PROBNP in the last 8760 hours. HbA1C: No results for input(s): HGBA1C in the last 72 hours. CBG: No results for input(s): GLUCAP in the last 168 hours. Lipid Profile: No results for input(s): CHOL, HDL, LDLCALC, TRIG, CHOLHDL, LDLDIRECT in the last 72 hours. Thyroid Function Tests: No results for input(s): TSH, T4TOTAL, FREET4, T3FREE, THYROIDAB in the last 72 hours. Anemia Panel: No results for input(s): VITAMINB12, FOLATE, FERRITIN, TIBC, IRON, RETICCTPCT in the last 72 hours.   Recent Results (from the past 240 hour(s))  Urine Culture     Status: Abnormal   Collection Time: 07/11/21 12:16 PM   Specimen: Urine, Clean Catch  Result Value Ref Range Status   Specimen Description   Final    URINE, CLEAN CATCH Performed at [facility], [address] [place], [place] [address]    Special Requests   Final    NONE Performed at [facility], [place] [address], [place], [place] [address]    Culture (A)  Final    <10,000 COLONIES/mL INSIGNIFICANT GROWTH Performed at [place] [address], [place],  [address]    Report Status 07/13/2021 FINAL  Final  SARS Coronavirus 2 by RT PCR (hospital order, performed in [facility] hospital lab) Nasopharyngeal Nasopharyngeal Swab     Status: None   Collection Time: 07/18/21  8:08 AM   Specimen: Nasopharyngeal Swab  Result Value Ref Range Status   SARS Coronavirus 2 NEGATIVE NEGATIVE Final    Comment: (NOTE) SARS-CoV-2 target nucleic acids are NOT DETECTED.  The SARS-CoV-2 RNA is generally detectable in upper and lower respiratory specimens during the acute phase of infection. The lowest concentration of SARS-CoV-2 viral copies this assay can detect is 250 copies / mL. A negative result does not preclude SARS-CoV-2 infection and should  not be used as the sole basis for treatment or other patient management decisions.  A negative result may occur with improper specimen collection / handling, submission of specimen other than nasopharyngeal swab, presence of viral mutation(s) within the areas targeted by this assay, and inadequate number of viral copies (<250 copies / mL). A negative result must be combined with clinical  observations, patient history, and epidemiological information.  Fact Sheet for Patients:   StrictlyIdeas.no  Fact Sheet for Healthcare Providers: BankingDealers.co.za  This test is not yet approved or  cleared by the [place] FDA and has been authorized for detection and/or diagnosis of SARS-CoV-2 by FDA under an Emergency Use Authorization (EUA).  This EUA will remain in effect (meaning this test can be used) for the duration of the COVID-19 declaration under Section 564(b)(1) of the Act, 21 U.S.C. section 360bbb-3(b)(1), unless the authorization is terminated or revoked sooner.  Performed at [facility], [place] [address], [place], [place] [address]   Resp Panel by RT-PCR (Flu A&B, Covid) Nasopharyngeal Swab     Status: None   Collection Time: 07/19/21  8:01 PM   Specimen: Nasopharyngeal Swab; Nasopharyngeal(NP) swabs in vial transport medium  Result Value Ref Range Status   SARS Coronavirus 2 by RT PCR NEGATIVE NEGATIVE Final    Comment: (NOTE) SARS-CoV-2 target nucleic acids are NOT DETECTED.  The SARS-CoV-2 RNA is generally detectable in upper respiratory specimens during the acute phase of infection. The lowest concentration of SARS-CoV-2 viral copies this assay can detect is 138 copies/mL. A negative result does not preclude SARS-Cov-2 infection and should not be used as the sole basis for treatment or other patient management decisions. A negative result may occur with  improper specimen collection/handling, submission  of specimen other than nasopharyngeal swab, presence of viral mutation(s) within the areas targeted by this assay, and inadequate number of viral copies(<138 copies/mL). A negative result must be combined with clinical observations, patient history, and epidemiological information. The expected result is Negative.  Fact Sheet for Patients:  EntrepreneurPulse.com.au  Fact Sheet for Healthcare Providers:  IncredibleEmployment.be  This test is no t yet approved or cleared by the [place] FDA and  has been authorized for detection and/or diagnosis of SARS-CoV-2 by FDA under an Emergency Use Authorization (EUA). This EUA will remain  in effect (meaning this test can be used) for the duration of the COVID-19 declaration under Section 564(b)(1) of the Act, 21 U.S.C.section 360bbb-3(b)(1), unless the authorization is terminated  or revoked sooner.       Influenza A by PCR NEGATIVE NEGATIVE Final   Influenza B by PCR NEGATIVE NEGATIVE Final    Comment: (NOTE) The Xpert Xpress SARS-CoV-2/FLU/RSV plus assay is intended as an aid in the diagnosis of influenza from Nasopharyngeal swab specimens and should not be used as a sole basis for treatment. Nasal washings and aspirates are unacceptable for Xpert Xpress SARS-CoV-2/FLU/RSV testing.  Fact Sheet for Patients: EntrepreneurPulse.com.au  Fact Sheet for Healthcare Providers: IncredibleEmployment.be  This test is not yet approved or cleared by the [place] FDA and has been authorized for detection and/or diagnosis of SARS-CoV-2 by FDA under an Emergency Use Authorization (EUA). This EUA will remain in effect (meaning this test can be used) for the duration of the COVID-19 declaration under Section 564(b)(1) of the Act, 21 U.S.C. section 360bbb-3(b)(1), unless the authorization is terminated or revoked.  Performed at [facility], [place]  [address] [place], [place] [address]            Radiological Exams on Admission: Personally reviewed imaging which shows: Imaging demonstrated acute DVT in left femoral vein, enlarging of endometrial mass, and acute versus subacute left hip fracture CT Abdomen Pelvis W Contrast  Addendum Date: 07/19/2021   ADDENDUM REPORT: 07/19/2021 17:23 ADDENDUM: The original report was by Dr. Van Clines. The following addendum is  by Dr. Van Clines: Critical Value/emergent results were called by telephone at the time of interpretation on 07/19/2021 at 5:13 pm to provider Dr. Denman George, who verbally acknowledged these results. Electronically Signed   By: Van Clines M.D.   On: 07/19/2021 17:23   Result Date: 07/19/2021 CLINICAL DATA:  Endometrial adenocarcinoma.  Pelvic pain. EXAM: CT ABDOMEN AND PELVIS WITH CONTRAST TECHNIQUE: Multidetector CT imaging of the abdomen and pelvis was performed using the standard protocol following bolus administration of intravenous contrast. CONTRAST:  73m OMNIPAQUE IOHEXOL 350 MG/ML SOLN COMPARISON:  01/05/2020 FINDINGS: Despite efforts by the technologist and patient, motion artifact is present on today's exam and could not be eliminated. This reduces exam sensitivity and specificity. Lower chest: The lower margin of a newly apparent right middle lobe pulmonary nodule measures 1.1 by 0.9 cm on image 1 series 7. This nodule is not fully included on today's exam and the non included portion may actually be larger. Contrast filled distal esophagus with patulous GE junction and small type 1 hiatal hernia, appearance compatible with gastroesophageal reflux. Right coronary artery and descending thoracic aortic atherosclerotic calcification. Hepatobiliary: Punctate calcifications in the liver compatible with old granulomatous disease. Contracted gallbladder, blurred by motion artifact. Pancreas: Unremarkable Spleen: Punctate calcifications from old granulomatous disease.  Adrenals/Urinary Tract: Hypodense right kidney upper pole lesion 0.7 by 0.6 cm on image 32 series 2 anteriorly, nonspecific. Similar sized left kidney upper pole lesion on image 26 series 2. These are likely cysts but technically too small to characterize. Urinary bladder and adrenal glands appear unremarkable. Stomach/Bowel: Small type 1 hiatal hernia. Distended rectum 9.4 cm transverse by 9.2 cm anterior-posterior, potentially from fecal impaction, with faint stranding in the perirectal space such that stercoral colitis cannot be excluded. Scattered diverticula of the descending colon. Vascular/Lymphatic: Aortoiliac atherosclerotic vascular disease. Right external iliac node 1.3 cm in short axis on image 61 series 2, formerly not readily apparent. Filling defect in the left common femoral artery and left external iliac artery compatible with acute deep vein thrombosis. Reproductive: Enlarging heterogeneous mass centrally in the uterus, with an IUD extending in the mass and a small amount of gas in the heterogeneously enhancing mass. This currently measures about 10.8 by 6.6 by 11.2 cm (volume = 420 cm^3) with suspicion for local extension beyond the myometrial margin of the uterus for example superiorly on image 49 series 6. This has substantially enlarged from prior measurement of 6.5 by 4.0 by 6.4 cm (volume = 87 cm^3) on 01/05/2020. Other: No supplemental non-categorized findings. Musculoskeletal: Acute or subacute left femoral neck fracture with left hip effusion. Striking bony demineralization. Lumbar spondylosis and degenerative disc disease with acquired interbody fusions at multiple levels. Foraminal impingement observed bilaterally at L4-5 and L5-S1. IMPRESSION: 1. Acute deep vein thrombosis in the left common femoral vein and adjacent external iliac vein. 2. Acute or subacute left femoral neck fracture, moderately displaced. Left hip effusion. Bony demineralization. 3. The endometrial mass has enlarged  to nearly 5 times the size shown on 01/04/2021, likely with some early extra uterine extension. There is mild right external iliac adenopathy as well as a newly appreciable right middle lobe pulmonary nodule measuring at least 1.1 cm in diameter. 4. Prominent stool in the rectal vault suggesting impaction. Faint perirectal stranding may reflect stercoral colitis. 5. Gastroesophageal reflux.  Small hiatal hernia. 6. Other imaging findings of potential clinical significance: Aortic Atherosclerosis (ICD10-I70.0). Coronary atherosclerosis. Old granulomatous disease. Radiology assistant personnel have been notified to put me in telephone contact  with the referring physician or the referring physician's clinical representative in order to discuss these findings. Once this communication is established I will issue an addendum to this report for documentation purposes. Electronically Signed: By: Van Clines M.D. On: 07/19/2021 17:10   DG Hip Unilat W or Wo Pelvis 2-3 Views Left  Result Date: 07/20/2021 CLINICAL DATA:  Atraumatic left hip pain. EXAM: DG HIP (WITH OR WITHOUT PELVIS) 2-3V LEFT COMPARISON:  October 11, 2012 FINDINGS: An acute fracture deformity is seen extending through the neck of the proximal left femur. Approximately 1/2 shaft width dorsal displacement of the distal fracture site is noted. There is no evidence of dislocation. Of incidental note is the presence of an extensive amount of contrast seen throughout the large bowel and urinary bladder. IMPRESSION: Acute fracture of the proximal left femur. Electronically Signed   By: Virgina Norfolk M.D.   On: 07/20/2021 00:40    EKG: Independently reviewed.  Atrial fib       Assessment/Plan   1.  Left femoral DVT -CT scan demonstrated acute DVT in the left common femoral vein - Placed on heparin drip, continue for now until plans regarding left hip fracture resolved  2.  Left hip fracture -Imaging showed fracture of left proximal  femur - Orthopedics contacted by the ER, will evaluate in the morning - Bedbound at baseline - Fall precautions  3.  Atrial fibrillation -On admission, noted to be in rate controlled A. fib -No previous documented history of A. Fib -Currently rate fairly controlled, will hold off on adding agent at this time - Continue heparin drip as above  4.  Endometrial carcinoma -Followed by oncology, GYN -Recent imaging noted enlarging of mass - Management per oncology, GYN  5.  Hypokalemia -On admission potassium 3 - Supplementation ordered -Magnesium level added on - Follow-up labs ordered  6.  Essential hypertension -Restart home medications once reconciled    DVT prophylaxis: Heparin drip Code Status: Full Family Communication: Daughter bedside Disposition Plan: Anticipate discharge home with home health versus SNF when medically optimized Consults called: Orthopedics contacted by ED Admission status: Inpatient      Medical decision making: Patient seen at 12:52 AM on 07/20/2021.  The patient was discussed with ER provider.  What exists of the patient's chart was reviewed in depth and summarized above.  Clinical condition: Fair.        Doran Heater [facility] Please page though White or Epic secure chat:  For password, contact charge nurse

## 2021-07-20 NOTE — ED Notes (Signed)
Attempt to collect heparin was not enough blood for test tub

## 2021-07-20 NOTE — ED Notes (Signed)
Provided pt with crackers, applesauce and popsickle.

## 2021-07-20 NOTE — Progress Notes (Signed)
Patient seen and examined.  Remains in the emergency room waiting for inpatient bed assignment.  Admitted early morning hours by nighttime hospitalist.  History and physical reviewed.  Concurred with the patient.  Also discussed with patient's daughter on the phone.  In brief, 87 year old female with history of endometrial carcinoma previously treated with [place], hypertension, previous stroke, extreme debility and bedbound brought to the ER with abnormal CT scan of the pelvis showing multiple blood clots and a femoral vein, left femoral neck fracture and worsening endometrial cancer spread.  Started on heparin infusion and admitted to the hospital.  #1. metastatic endometrial cancer now with thromboembolism and suspected pathological left hip fracture: Patient with extreme debility, bedbound status. Plan as per gynecology oncology.  Will await their recommendations.  Probably a palliative candidate.  I will consult palliative care to start conversation.  #2. extensive left-sided lower extremity DVTs: Due to metastatic cancer.  Started on heparin.  Tolerating.  We will continue.  If no further surgical plans, will change to oral anticoagulation tomorrow.  #3. nontraumatic left hip fracture: Suspected pathological fracture.  Bedbound patient.  Seen by orthopedics.  Recommended nonoperative treatment, bed transfers but no weightbearing.  Patient does not walk.  Adequate pain management.  #4. new onset A. fib: Found to be rate controlled A. fib on admission.  Probably old.  Currently rate controlled.  Started on anticoagulation.  No further therapy needed.  #5. Hypokalemia/hypomagnesemia: Replace aggressively and recheck levels tomorrow morning.  Total time spent: No charge visit.

## 2021-07-20 NOTE — Progress Notes (Signed)
[place] for Heparin Indication: DVT  Allergies  Allergen Reactions   Asa [Aspirin] Other (See Comments)    Doctor told her not to take due to type anemia   Phosphate Other (See Comments)    Anything containing phosphate 6. Unknown reaction   Codeine Other (See Comments)    Made her too sleepy   Penicillins Itching and Swelling    Tolerates keflex  Did it involve swelling of the face/tongue/throat, SOB, or low BP? unknown Did it involve sudden or severe rash/hives, skin peeling, or any reaction on the inside of your mouth or nose? unknown Did you need to seek medical attention at a hospital or doctor's office? unknown When did it last happen?   years ago.    If all above answers are "NO", may proceed with cephalosporin use.    Sodium Phosphate Other (See Comments)    unknown   Sulfa Antibiotics Hives    Patient Measurements:   Heparin dosing weight: 70kg   Vital Signs: Temp: 99.1 F (37.3 C) (07/23 2227) Temp Source: Oral (07/23 2227) BP: 132/60 (07/23 2227) Pulse Rate: 91 (07/23 2227)  Labs: Recent Labs    07/19/21 2001 07/19/21 2018 07/20/21 0320 07/20/21 1014 07/20/21 1358 07/20/21 2223  HGB 8.8*  --  8.4*  --   --   --   HCT 28.5*  --  26.9*  --   --   --   PLT 586*  --  568*  --   --   --   APTT 29  --   --   --   --   --   LABPROT 12.6  --   --   --   --   --   INR 0.9  --   --   --   --   --   HEPARINUNFRC  --   --   --  <0.10* <0.10* 0.69  CREATININE  --  0.38* 0.49  --   --   --      Estimated Creatinine Clearance: 50 mL/min (by C-G formula based on SCr of 0.49 mg/dL).   Medical History: Past Medical History:  Diagnosis Date   Edema    Edema of both legs 01/30/2016   Glucose-6-phosphate dehydrogenase deficiency    possible diagnosis of G6PD per patient "phosphate 6 deficiency"   Hypertension    Osteoarthritis    Pernicious anemia    Stroke ([place]) 09/28/2000   couldn't move her left foot which  resolved    Medications:  Infusions:   Assessment: 87 yo M with hx endometrial CA s/p vaginal biopsy on 7/21.  Follow-up outpatient pelvic CT today revealed DVT of L femoral & external iliac veins and hip fracture.  Not on anticoagulation PTA.  Today, 07/20/21  Heparin level 0.69 now therapeutic at upper end of goal range on IV heparin infusion at 1200 units/hr Hgb low at 8.4, near pt baseline  Plt 568  No infusion issues or bleeding per RN    Goal of Therapy:  Heparin level 0.3-0.7 units/ml Monitor platelets by anticoagulation protocol: Yes   Plan:  Decrease heparin infusion to 1100 units/hr to maintain therapeutic range Recheck heparin level 8h after rate change Daily heparin level & CBC while on heparin Monitor closely for s/sx of bleeding F/U plans to transition to [place], PharmD, BCPS 07/20/2021 11:51 PM

## 2021-07-20 NOTE — ED Notes (Signed)
Patient is alert and oriented.  Answers all questions appropriately.  Patient is waiting for admission bed assignment

## 2021-07-20 NOTE — ED Notes (Signed)
Dinner served: intake good 90%

## 2021-07-20 NOTE — Progress Notes (Signed)
ANTICOAGULATION CONSULT NOTE - Initial Consult  Pharmacy Consult for Heparin Indication: DVT  Allergies  Allergen Reactions   Asa [Aspirin] Other (See Comments)    Doctor told her not to take due to type anemia   Phosphate Other (See Comments)    Anything containing phosphate 6. Unknown reaction   Codeine Other (See Comments)    Made her too sleepy   Penicillins Itching and Swelling    Tolerates keflex  Did it involve swelling of the face/tongue/throat, SOB, or low BP? unknown Did it involve sudden or severe rash/hives, skin peeling, or any reaction on the inside of your mouth or nose? unknown Did you need to seek medical attention at a hospital or doctor's office? unknown When did it last happen?   years ago.    If all above answers are "NO", may proceed with cephalosporin use.    Sodium Phosphate Other (See Comments)    unknown   Sulfa Antibiotics Hives    Patient Measurements:   Heparin dosing weight: 70kg   Vital Signs: Temp: 98.1 F (36.7 C) (07/23 0739) Temp Source: Oral (07/23 0739) BP: 173/100 (07/23 1030) Pulse Rate: 92 (07/23 1030)  Labs: Recent Labs    07/19/21 2001 07/19/21 2018 07/20/21 0320 07/20/21 1014  HGB 8.8*  --  8.4*  --   HCT 28.5*  --  26.9*  --   PLT 586*  --  568*  --   APTT 29  --   --   --   LABPROT 12.6  --   --   --   INR 0.9  --   --   --   HEPARINUNFRC  --   --   --  <0.10*  CREATININE  --  0.38* 0.49  --     Estimated Creatinine Clearance: 50 mL/min (by C-G formula based on SCr of 0.49 mg/dL).   Medical History: Past Medical History:  Diagnosis Date   Edema    Edema of both legs 01/30/2016   Glucose-6-phosphate dehydrogenase deficiency    possible diagnosis of G6PD per patient "phosphate 6 deficiency"   Hypertension    Osteoarthritis    Pernicious anemia    Stroke ([place]) 09/28/2000   couldn't move her left foot which resolved    Medications:  Infusions:   Assessment: 87 yo M with hx endometrial CA s/p vaginal  biopsy on 7/21.  Follow-up outpatient pelvic CT today revealed DVT of L femoral & external iliac veins and hip fracture.  Not on anticoagulation PTA.  Today, 07/20/21  HL is <0.10  Hgb low at 8.4, near pt baseline  Plt 568  SCr < 1 mg/dl  No line or bleeding issues per RN    Goal of Therapy:  Heparin level 0.3-0.7 units/ml Monitor platelets by anticoagulation protocol: Yes   Plan:  Heparin 2000 units IV x1  then continue heparin infusion at 1200 units/hr Check heparin level 8h after infusion started Daily heparin level & CBC while on heparin Monitor closely for s/sx of bleeding   Royetta Asal, PharmD, BCPS 07/20/2021 11:04 AM

## 2021-07-20 NOTE — ED Notes (Signed)
Breakfast: fed self, intake 90%

## 2021-07-20 NOTE — ED Notes (Signed)
Patient's daughter [name] was updated on the patient's plan of care, waiting for a palliative consult.

## 2021-07-21 DIAGNOSIS — I824Y2 Acute embolism and thrombosis of unspecified deep veins of left proximal lower extremity: Secondary | ICD-10-CM | POA: Diagnosis not present

## 2021-07-21 DIAGNOSIS — S72002A Fracture of unspecified part of neck of left femur, initial encounter for closed fracture: Secondary | ICD-10-CM | POA: Diagnosis not present

## 2021-07-21 DIAGNOSIS — C541 Malignant neoplasm of endometrium: Secondary | ICD-10-CM | POA: Diagnosis not present

## 2021-07-21 LAB — CBC
HCT: 25.4 % — ABNORMAL LOW (ref 36.0–46.0)
Hemoglobin: 7.8 g/dL — ABNORMAL LOW (ref 12.0–15.0)
MCH: 25.3 pg — ABNORMAL LOW (ref 26.0–34.0)
MCHC: 30.7 g/dL (ref 30.0–36.0)
MCV: 82.5 fL (ref 80.0–100.0)
Platelets: 540 10*3/uL — ABNORMAL HIGH (ref 150–400)
RBC: 3.08 MIL/uL — ABNORMAL LOW (ref 3.87–5.11)
RDW: 24.2 % — ABNORMAL HIGH (ref 11.5–15.5)
WBC: 11.8 10*3/uL — ABNORMAL HIGH (ref 4.0–10.5)
nRBC: 0 % (ref 0.0–0.2)

## 2021-07-21 MED ORDER — SODIUM CHLORIDE 0.9% FLUSH
10.0000 mL | Freq: Two times a day (BID) | INTRAVENOUS | Status: DC
Start: 2021-07-21 — End: 2021-07-25
  Administered 2021-07-21 – 2021-07-24 (×7): 10 mL

## 2021-07-21 MED ORDER — APIXABAN 5 MG PO TABS: 5.0000 mg | ORAL_TABLET | Freq: Two times a day (BID) | ORAL | Status: DC

## 2021-07-21 MED ORDER — APIXABAN 5 MG PO TABS
10.0000 mg | ORAL_TABLET | Freq: Two times a day (BID) | ORAL | Status: DC
  Administered 2021-07-21 – 2021-07-24 (×7): 10 mg via ORAL
  Filled 2021-07-21 (×7): qty 2

## 2021-07-21 MED ORDER — SODIUM CHLORIDE 0.9% FLUSH
10.0000 mL | INTRAVENOUS | Status: DC | PRN
  Administered 2021-07-24: 10 mL

## 2021-07-21 NOTE — Progress Notes (Signed)
[facility] for apixaban  Indication: DVT  Allergies  Allergen Reactions   Asa [Aspirin] Other (See Comments)    Doctor told her not to take due to type anemia   Phosphate Other (See Comments)    Anything containing phosphate 6. Unknown reaction   Codeine Other (See Comments)    Made her too sleepy   Penicillins Itching and Swelling    Tolerates keflex  Did it involve swelling of the face/tongue/throat, SOB, or low BP? unknown Did it involve sudden or severe rash/hives, skin peeling, or any reaction on the inside of your mouth or nose? unknown Did you need to seek medical attention at a hospital or doctor's office? unknown When did it last happen?   years ago.    If all above answers are "NO", may proceed with cephalosporin use.    Sodium Phosphate Other (See Comments)    unknown   Sulfa Antibiotics Hives    Patient Measurements:   Heparin dosing weight: 70kg   Vital Signs: Temp: 98.5 F (36.9 C) (07/24 0247) Temp Source: Oral (07/24 0247) BP: 145/108 (07/24 0634) Pulse Rate: 92 (07/24 0634)  Labs: Recent Labs    07/19/21 2001 07/19/21 2018 07/20/21 0320 07/20/21 1014 07/20/21 1358 07/20/21 2223 07/21/21 0433  HGB 8.8*  --  8.4*  --   --   --  7.8*  HCT 28.5*  --  26.9*  --   --   --  25.4*  PLT 586*  --  568*  --   --   --  540*  APTT 29  --   --   --   --   --   --   LABPROT 12.6  --   --   --   --   --   --   INR 0.9  --   --   --   --   --   --   HEPARINUNFRC  --   --   --  <0.10* <0.10* 0.69  --   CREATININE  --  0.38* 0.49  --   --   --   --      Estimated Creatinine Clearance: 50 mL/min (by C-G formula based on SCr of 0.49 mg/dL).   Medical History: Past Medical History:  Diagnosis Date   Edema    Edema of both legs 01/30/2016   Glucose-6-phosphate dehydrogenase deficiency    possible diagnosis of G6PD per patient "phosphate 6 deficiency"   Hypertension    Osteoarthritis    Pernicious anemia    Stroke  ([place]) 09/28/2000   couldn't move her left foot which resolved    Medications:  Infusions:   Assessment: 87 yo M with hx endometrial CA s/p vaginal biopsy on 7/21.  Follow-up outpatient pelvic CT today revealed DVT of L femoral & external iliac veins and hip fracture.  Not on anticoagulation PTA.  Today, 07/21/21  Heparin is being transitioned to apixaban Hgb low at 8.4, near pt baseline  Plt 540 No infusion issues or bleeding per RN    Goal of Therapy:  Heparin level 0.3-0.7 units/ml Monitor platelets by anticoagulation protocol: Yes   Plan:  Stop heparin drip and start apixaban 10 mg PO BID x 7 days then 5 mg PO BID  Monitor CBC, SCr  Monitor closely for s/sx of bleeding    Royetta Asal, PharmD, BCPS 07/21/2021 10:22 AM

## 2021-07-21 NOTE — Progress Notes (Signed)
PROGRESS NOTE    Connie Miranda  [id number] DOB: 02/08/1933 DOA: 07/19/2021 PCP: Clovia Cuff, MD    Brief Narrative:  87 year old female with history of endometrial carcinoma previously treated with Minerva, hypertension, previous stroke, extreme debility and bedbound brought to the ER with abnormal CT scan of the pelvis showing multiple blood clots and a femoral vein, left femoral neck fracture and worsening endometrial cancer spread.  Started on heparin infusion and admitted to the hospital.   Assessment & Plan:   Active Problems:   Hypertension   Hypokalemia   Endometrial carcinoma (HCC)   DVT (deep venous thrombosis) (HCC)   Unspecified atrial fibrillation (HCC)   Closed left hip fracture, initial encounter ([place])  #1. metastatic endometrial cancer now with thromboembolism and suspected pathological left hip fracture: Patient with extreme debility, bedbound status. Plan as per gynecology oncology.  Will await their recommendations.  Probably a palliative candidate. See goal of care discussions below.  Palliative care consulted and updated patient's family.   #2. extensive left-sided lower extremity DVTs: Due to metastatic cancer.  Started on heparin.  Tolerating.  Do not see any surgical procedure.  Will start on Eliquis.  Hopefully she can tolerate this.  If we decide hospice is best care for her, we may stop her Eliquis.  Discontinue Plavix because of very high risk of bleeding.  #3. nontraumatic left hip fracture: Suspected pathological fracture.  Bedbound patient.  Seen by orthopedics.  Recommended nonoperative treatment, bed transfers but no weightbearing.  Patient does not walk.  Adequate pain management.  Since she is mostly bedbound, she is just using Tylenol.   #4. new onset A. fib: Found to be rate controlled A. fib on admission.  Probably old.  Currently rate controlled.  Started on anticoagulation.  No further therapy needed.  Sinus rhythm now.   #5.  Hypokalemia/hypomagnesemia: Replaced with improvement.  #6. goal of care discussion: Patient with extreme debility, metastatic cancer, spreading thromboembolism, metastatic fracture.  Probably she is at her end-of-life. I have called and discussed this case with patient's daughter Ms. [name] who is her healthcare power of attorney and updated. They are waiting to hear from gynecology oncology see if they have any options. Family agreeable to meet with palliative care team, consult placed. If everybody agrees, she will be best served with home with hospice. Discharge pending palliation/hospice discussion.   DVT prophylaxis: SCDs Start: 07/20/21 0058   Code Status: Full code Family Communication: Daughter on the phone Disposition Plan: Status is: Inpatient  Remains inpatient appropriate because:Inpatient level of care appropriate due to severity of illness  Dispo: The patient is from: Home              Anticipated d/c is to:  Anticipate home with home hospice              Patient currently is not medically stable to d/c.   Difficult to place patient No         Consultants:  Orthopedics   Procedures:  None  Antimicrobials:  None   Subjective: Patient seen and examined.  No overnight events.  Looks comfortable at rest.  She tells me that she has some pain but mostly all over the leg.  Denies any nausea or vomiting.  Objective: Vitals:   07/20/21 2112 07/20/21 2227 07/21/21 0247 07/21/21 0634  BP: (!) 144/66 132/60 (!) 165/100 (!) 145/108  Pulse: 89 91 95 92  Resp: '17 15 20 20  '$ Temp:  99.1 F (37.3 C)  98.5 F (36.9 C)   TempSrc:  Oral Oral   SpO2: 98% 100% 96% 99%    Intake/Output Summary (Last 24 hours) at 07/21/2021 1017 Last data filed at 07/21/2021 0641 Gross per 24 hour  Intake 343.13 ml  Output 200 ml  Net 143.13 ml   There were no vitals filed for this visit.  Examination:  General exam: Appears calm and comfortable  Chronically debilitated and  sick looking but not in any distress.  She is on room air. Respiratory system: Bilateral clear. Cardiovascular system: S1 & S2 heard, RRR.  Gastrointestinal system: Soft and nontender. Central nervous system: Alert and oriented.  Does not move lower legs, generalized weakness. Extremities:  Extensive venous stasis changes and lymphedema both legs, hyperpigmentation.  Flaccidity with minimal movements of the distal muscle groups.    Data Reviewed: I have personally reviewed following labs and imaging studies  CBC: Recent Labs  Lab 07/19/21 2001 07/20/21 0320 07/21/21 0433  WBC 16.2* 13.7* 11.8*  NEUTROABS 14.2*  --   --   HGB 8.8* 8.4* 7.8*  HCT 28.5* 26.9* 25.4*  MCV 82.6 80.5 82.5  PLT 586* 568* 123456*   Basic Metabolic Panel: Recent Labs  Lab 07/19/21 2018 07/20/21 0320  NA 134* 134*  K 3.0* 3.1*  CL 101 103  CO2 23 23  GLUCOSE 114* 123*  BUN 15 14  CREATININE 0.38* 0.49  CALCIUM 8.9 8.8*  MG 1.8 1.7  PHOS  --  2.0*   GFR: Estimated Creatinine Clearance: 50 mL/min (by C-G formula based on SCr of 0.49 mg/dL). Liver Function Tests: Recent Labs  Lab 07/19/21 2018 07/20/21 0320  AST 19 16  ALT 11 10  ALKPHOS 84 87  BILITOT 0.5 0.8  PROT 7.4 7.0  ALBUMIN 2.6* 2.3*   No results for input(s): LIPASE, AMYLASE in the last 168 hours. No results for input(s): AMMONIA in the last 168 hours. Coagulation Profile: Recent Labs  Lab 07/19/21 2001  INR 0.9   Cardiac Enzymes: No results for input(s): CKTOTAL, CKMB, CKMBINDEX, TROPONINI in the last 168 hours. BNP (last 3 results) No results for input(s): PROBNP in the last 8760 hours. HbA1C: No results for input(s): HGBA1C in the last 72 hours. CBG: No results for input(s): GLUCAP in the last 168 hours. Lipid Profile: No results for input(s): CHOL, HDL, LDLCALC, TRIG, CHOLHDL, LDLDIRECT in the last 72 hours. Thyroid Function Tests: No results for input(s): TSH, T4TOTAL, FREET4, T3FREE, THYROIDAB in the last 72  hours. Anemia Panel: Recent Labs    07/20/21 1014  FERRITIN 65  TIBC 208*  IRON 24*   Sepsis Labs: No results for input(s): PROCALCITON, LATICACIDVEN in the last 168 hours.  Recent Results (from the past 240 hour(s))  Urine Culture     Status: Abnormal   Collection Time: 07/11/21 12:16 PM   Specimen: Urine, Clean Catch  Result Value Ref Range Status   Specimen Description   Final    URINE, CLEAN CATCH Performed at [facility], [address], [place], [place] [address]    Special Requests   Final    NONE Performed at [facility], [place] [address], [place], [place] [address]    Culture (A)  Final    <10,000 COLONIES/mL INSIGNIFICANT GROWTH Performed at [place] [address] [place], [place] [address]    Report Status 07/13/2021 FINAL  Final  SARS Coronavirus 2 by RT PCR (hospital order, performed in [facility] hospital  lab) Nasopharyngeal Nasopharyngeal Swab     Status: None   Collection Time: 07/18/21  8:08 AM   Specimen: Nasopharyngeal Swab  Result Value Ref Range Status   SARS Coronavirus 2 NEGATIVE NEGATIVE Final    Comment: (NOTE) SARS-CoV-2 target nucleic acids are NOT DETECTED.  The SARS-CoV-2 RNA is generally detectable in upper and lower respiratory specimens during the acute phase of infection. The lowest concentration of SARS-CoV-2 viral copies this assay can detect is 250 copies / mL. A negative result does not preclude SARS-CoV-2 infection and should not be used as the sole basis for treatment or other patient management decisions.  A negative result may occur with improper specimen collection / handling, submission of specimen other than nasopharyngeal swab, presence of viral mutation(s) within the areas targeted by this assay, and inadequate number of viral copies (<250 copies / mL). A negative result must be combined with clinical observations, patient history, and epidemiological  information.  Fact Sheet for Patients:   StrictlyIdeas.no  Fact Sheet for Healthcare Providers: BankingDealers.co.za  This test is not yet approved or  cleared by the [place] FDA and has been authorized for detection and/or diagnosis of SARS-CoV-2 by FDA under an Emergency Use Authorization (EUA).  This EUA will remain in effect (meaning this test can be used) for the duration of the COVID-19 declaration under Section 564(b)(1) of the Act, 21 U.S.C. section 360bbb-3(b)(1), unless the authorization is terminated or revoked sooner.  Performed at [facility], [place] [address], [place], [place] [address]   Resp Panel by RT-PCR (Flu A&B, Covid) Nasopharyngeal Swab     Status: None   Collection Time: 07/19/21  8:01 PM   Specimen: Nasopharyngeal Swab; Nasopharyngeal(NP) swabs in vial transport medium  Result Value Ref Range Status   SARS Coronavirus 2 by RT PCR NEGATIVE NEGATIVE Final    Comment: (NOTE) SARS-CoV-2 target nucleic acids are NOT DETECTED.  The SARS-CoV-2 RNA is generally detectable in upper respiratory specimens during the acute phase of infection. The lowest concentration of SARS-CoV-2 viral copies this assay can detect is 138 copies/mL. A negative result does not preclude SARS-Cov-2 infection and should not be used as the sole basis for treatment or other patient management decisions. A negative result may occur with  improper specimen collection/handling, submission of specimen other than nasopharyngeal swab, presence of viral mutation(s) within the areas targeted by this assay, and inadequate number of viral copies(<138 copies/mL). A negative result must be combined with clinical observations, patient history, and epidemiological information. The expected result is Negative.  Fact Sheet for Patients:  EntrepreneurPulse.com.au  Fact Sheet for Healthcare Providers:   IncredibleEmployment.be  This test is no t yet approved or cleared by the [place] FDA and  has been authorized for detection and/or diagnosis of SARS-CoV-2 by FDA under an Emergency Use Authorization (EUA). This EUA will remain  in effect (meaning this test can be used) for the duration of the COVID-19 declaration under Section 564(b)(1) of the Act, 21 U.S.C.section 360bbb-3(b)(1), unless the authorization is terminated  or revoked sooner.       Influenza A by PCR NEGATIVE NEGATIVE Final   Influenza B by PCR NEGATIVE NEGATIVE Final    Comment: (NOTE) The Xpert Xpress SARS-CoV-2/FLU/RSV plus assay is intended as an aid in the diagnosis of influenza from Nasopharyngeal swab specimens and should not be used as a sole basis for treatment. Nasal washings and aspirates are unacceptable for Xpert Xpress SARS-CoV-2/FLU/RSV testing.  Fact Sheet for Patients: EntrepreneurPulse.com.au  Fact Sheet for Healthcare Providers: IncredibleEmployment.be  This test is not yet approved or cleared by the [place] FDA and has been authorized for detection and/or diagnosis of SARS-CoV-2 by FDA under an Emergency Use Authorization (EUA). This EUA will remain in effect (meaning this test can be used) for the duration of the COVID-19 declaration under Section 564(b)(1) of the Act, 21 U.S.C. section 360bbb-3(b)(1), unless the authorization is terminated or revoked.  Performed at [facility], [place] [address] [place], [place] [address]          Radiology Studies: CT Abdomen Pelvis W Contrast  Addendum Date: 07/19/2021   ADDENDUM REPORT: 07/19/2021 17:23 ADDENDUM: The original report was by Dr. Van Clines. The following addendum is by Dr. Van Clines: Critical Value/emergent results were called by telephone at the time of interpretation on 07/19/2021 at 5:13 pm to provider Dr. Denman George, who verbally  acknowledged these results. Electronically Signed   By: Van Clines M.D.   On: 07/19/2021 17:23   Result Date: 07/19/2021 CLINICAL DATA:  Endometrial adenocarcinoma.  Pelvic pain. EXAM: CT ABDOMEN AND PELVIS WITH CONTRAST TECHNIQUE: Multidetector CT imaging of the abdomen and pelvis was performed using the standard protocol following bolus administration of intravenous contrast. CONTRAST:  21m OMNIPAQUE IOHEXOL 350 MG/ML SOLN COMPARISON:  01/05/2020 FINDINGS: Despite efforts by the technologist and patient, motion artifact is present on today's exam and could not be eliminated. This reduces exam sensitivity and specificity. Lower chest: The lower margin of a newly apparent right middle lobe pulmonary nodule measures 1.1 by 0.9 cm on image 1 series 7. This nodule is not fully included on today's exam and the non included portion may actually be larger. Contrast filled distal esophagus with patulous GE junction and small type 1 hiatal hernia, appearance compatible with gastroesophageal reflux. Right coronary artery and descending thoracic aortic atherosclerotic calcification. Hepatobiliary: Punctate calcifications in the liver compatible with old granulomatous disease. Contracted gallbladder, blurred by motion artifact. Pancreas: Unremarkable Spleen: Punctate calcifications from old granulomatous disease. Adrenals/Urinary Tract: Hypodense right kidney upper pole lesion 0.7 by 0.6 cm on image 32 series 2 anteriorly, nonspecific. Similar sized left kidney upper pole lesion on image 26 series 2. These are likely cysts but technically too small to characterize. Urinary bladder and adrenal glands appear unremarkable. Stomach/Bowel: Small type 1 hiatal hernia. Distended rectum 9.4 cm transverse by 9.2 cm anterior-posterior, potentially from fecal impaction, with faint stranding in the perirectal space such that stercoral colitis cannot be excluded. Scattered diverticula of the descending colon. Vascular/Lymphatic:  Aortoiliac atherosclerotic vascular disease. Right external iliac node 1.3 cm in short axis on image 61 series 2, formerly not readily apparent. Filling defect in the left common femoral artery and left external iliac artery compatible with acute deep vein thrombosis. Reproductive: Enlarging heterogeneous mass centrally in the uterus, with an IUD extending in the mass and a small amount of gas in the heterogeneously enhancing mass. This currently measures about 10.8 by 6.6 by 11.2 cm (volume = 420 cm^3) with suspicion for local extension beyond the myometrial margin of the uterus for example superiorly on image 49 series 6. This has substantially enlarged from prior measurement of 6.5 by 4.0 by 6.4 cm (volume = 87 cm^3) on 01/05/2020. Other: No supplemental non-categorized findings. Musculoskeletal: Acute or subacute left femoral neck fracture with left hip effusion. Striking bony demineralization. Lumbar spondylosis and degenerative disc disease with acquired interbody fusions at multiple levels. Foraminal impingement observed bilaterally at L4-5 and L5-S1. IMPRESSION: 1. Acute  deep vein thrombosis in the left common femoral vein and adjacent external iliac vein. 2. Acute or subacute left femoral neck fracture, moderately displaced. Left hip effusion. Bony demineralization. 3. The endometrial mass has enlarged to nearly 5 times the size shown on 01/04/2021, likely with some early extra uterine extension. There is mild right external iliac adenopathy as well as a newly appreciable right middle lobe pulmonary nodule measuring at least 1.1 cm in diameter. 4. Prominent stool in the rectal vault suggesting impaction. Faint perirectal stranding may reflect stercoral colitis. 5. Gastroesophageal reflux.  Small hiatal hernia. 6. Other imaging findings of potential clinical significance: Aortic Atherosclerosis (ICD10-I70.0). Coronary atherosclerosis. Old granulomatous disease. Radiology assistant personnel have been  notified to put me in telephone contact with the referring physician or the referring physician's clinical representative in order to discuss these findings. Once this communication is established I will issue an addendum to this report for documentation purposes. Electronically Signed: By: Van Clines M.D. On: 07/19/2021 17:10   DG Hip Unilat W or Wo Pelvis 2-3 Views Left  Result Date: 07/20/2021 CLINICAL DATA:  Atraumatic left hip pain. EXAM: DG HIP (WITH OR WITHOUT PELVIS) 2-3V LEFT COMPARISON:  October 11, 2012 FINDINGS: An acute fracture deformity is seen extending through the neck of the proximal left femur. Approximately 1/2 shaft width dorsal displacement of the distal fracture site is noted. There is no evidence of dislocation. Of incidental note is the presence of an extensive amount of contrast seen throughout the large bowel and urinary bladder. IMPRESSION: Acute fracture of the proximal left femur. Electronically Signed   By: Virgina Norfolk M.D.   On: 07/20/2021 00:40        Scheduled Meds:  magnesium oxide  800 mg Oral BID   sodium chloride flush  10-40 mL Intracatheter Q12H   Continuous Infusions:   LOS: 2 days    Time spent: 40 minutes    Barb Merino, MD [facility] Pager [phone]

## 2021-07-21 NOTE — Progress Notes (Addendum)
End of Shift:  Patient had an uneventful night. Heparin going at 53m/ hour. Pain managed with PRN tylenol overnight. No change in mentation or acute changes in telemetry.   Morning labs :  Hgb 7.8

## 2021-07-22 ENCOUNTER — Other Ambulatory Visit: Payer: Self-pay | Admitting: Oncology

## 2021-07-22 DIAGNOSIS — Z7189 Other specified counseling: Secondary | ICD-10-CM

## 2021-07-22 DIAGNOSIS — C541 Malignant neoplasm of endometrium: Secondary | ICD-10-CM

## 2021-07-22 DIAGNOSIS — S72002A Fracture of unspecified part of neck of left femur, initial encounter for closed fracture: Secondary | ICD-10-CM

## 2021-07-22 DIAGNOSIS — I82412 Acute embolism and thrombosis of left femoral vein: Secondary | ICD-10-CM | POA: Diagnosis not present

## 2021-07-22 DIAGNOSIS — I824Y2 Acute embolism and thrombosis of unspecified deep veins of left proximal lower extremity: Secondary | ICD-10-CM | POA: Diagnosis not present

## 2021-07-22 LAB — CBC
HCT: 27.4 % — ABNORMAL LOW (ref 36.0–46.0)
Hemoglobin: 8.7 g/dL — ABNORMAL LOW (ref 12.0–15.0)
MCH: 25.4 pg — ABNORMAL LOW (ref 26.0–34.0)
MCHC: 31.8 g/dL (ref 30.0–36.0)
MCV: 80.1 fL (ref 80.0–100.0)
Platelets: 504 10*3/uL — ABNORMAL HIGH (ref 150–400)
RBC: 3.42 MIL/uL — ABNORMAL LOW (ref 3.87–5.11)
RDW: 24.2 % — ABNORMAL HIGH (ref 11.5–15.5)
WBC: 11.8 10*3/uL — ABNORMAL HIGH (ref 4.0–10.5)
nRBC: 0 % (ref 0.0–0.2)

## 2021-07-22 LAB — SURGICAL PATHOLOGY

## 2021-07-22 NOTE — Plan of Care (Signed)
  Problem: Education: Goal: Knowledge of General Education information will improve Description: Including pain rating scale, medication(s)/side effects and non-pharmacologic comfort measures Outcome: Progressing   Problem: Clinical Measurements: Goal: Ability to maintain clinical measurements within normal limits will improve Outcome: Progressing Goal: Diagnostic test results will improve Outcome: Progressing   Problem: Activity: Goal: Risk for activity intolerance will decrease Outcome: Progressing   Problem: Nutrition: Goal: Adequate nutrition will be maintained Outcome: Progressing   

## 2021-07-22 NOTE — Consult Note (Signed)
GYNECOLOGIC ONCOLOGY INPATIENT CONSULTATION  Date of Service: 07/22/21 Requesting Service: [facility] Requesting Provider: Dr. Barb Merino Consulting Provider: Jeral Pinch, MD  HISTORY OF PRESENT ILLNESS: Connie Miranda is a 87 y.o. woman who is seen in consultation at the request of Dr. Dante Gang for evaluation of metastatic endometrial cancer now in the setting of acute DVT and subacute vs acute femur fracture.  The patient's cancer history is outlined below.  Briefly, the patient initially presented in January 2021 with concerns for a vulvar abscess.  She had altered mental status at that time and became encephalopathic.  She underwent work-up including a pelvic ultrasound and CT scan that noted thickened endometrium and a displaced IUD -this was a Lippes loop IUD and thought to perhaps be the source of sepsis.  She was ultimately taken by GYN for endometrial sampling as well as IUD removal.  Final pathology revealed grade 2 endometrioid endometrial adenocarcinoma.    Given her significant medical comorbidities as well as her bedbound status, I discussed treatment options with the patient's daughter, who is her healthcare power of attorney.  We extensively reviewed surgery, oral progesterone, progesterone IUD, and radiation therapy.  She was not felt to be a good candidate for surgery, oral progesterone, or definitive radiation therapy.  Thus, I recommended that we proceed with Mirena IUD placement.  The patient saw me in clinic on 02/08/2020 and had a Mirena IUD inserted.  I had a phone visit with the patient's daughter in June and overall the patient was doing well with minimal vaginal spotting.  We had planned to see her in the office in August or September for an endometrial biopsy.  The patient ultimately was seen back in the clinic in April of this year and underwent exam and endometrial biopsy.  Her exam is quite difficult given her limited lower extremity mobility.  Her exam and  biopsy have to be done blindly.  On exam, her cervix was appreciated to be normal and an endometrial biopsy revealed persistent grade 2 endometrioid adenocarcinoma.  I had multiple conversations with the patient's daughter about treatment options given persistent grade 2 cancer.  Despite her many comorbid conditions and immobility, we ultimately opted to proceed with getting surgery scheduled, as I felt she was a better candidate for surgery then she was for definitive radiation.  Unfortunately, on exam under anesthesia last Thursday at the time of her surgery, her cervix was replaced by tumor and there was tumor extending inferiorly along the posterior aspect of her vagina.  Her uterus itself felt significantly enlarged from her last exam 3 months ago and quite firm.  Several biopsies were taken of her posterior vagina.  Given findings, plan for definitive surgery was aborted.  I spoke with the patient's daughter after surgery and plan was for CT to evaluate for metastatic disease with consideration of referral to radiation oncology to discuss again the possibility of definitive radiation.  Unfortunately, the CT scan showed evidence of metastatic disease as well as some acute findings including acute VTE and acute versus subacute femur fracture.  The patient was called over the weekend with these results and is now admitted on the hospitalist service.  She was seen by orthopedics who has recommended nonoperative management of her femur fracture.  She was initially started on a heparin drip and now has been transitioned to Eliquis.  This morning, the patient is eating breakfast in bed. She endorses sleeping well last night. Denies any left leg or hip pain today.  Had some bowel function yesterday. Denies any nausea, emesis, shortness of breath or chest pain.  Treatment History: Oncology History Overview Note  MMR IHC and MSI testing: loss of MLH1, PMS2   Endometrial carcinoma ([place])  12/24/2019 Imaging    CT A/P: IMPRESSION: 1. The previously measured right vaginal fluid collection at 2.8 x 1 point 0 cm is not clearly demonstrated on today's study. There does appear to be a fluid collection in the left labia majora measuring 4.3 x 1.7 cm, similar to prior study. No features on today's study to suggest necrotizing fasciitis. 2. Marked apparent thickening of the endometrium. Neoplasm not excluded. Pelvic ultrasound recommended to further evaluate. 3. Collapsed gallbladder. Gallstones documented on previous cholecystostomy tube injection are not visualized on today's CT. 4.  Aortic Atherosclerois (ICD10-170.0)   12/26/2019 Imaging   Pelvic ultrasound: 1. Thickened endometrial stripe measuring up to 19.1 mm in thickness. Endometrial thickness is considered abnormal for an asymptomatic post-menopausal female. Endometrial sampling should be considered to exclude carcinoma. 2. Nonvisualization of the ovaries. No adnexal mass or abnormal free fluid within the pelvis. 3. No other acute abnormality.   01/02/2020 Initial Biopsy   D&C - Grade 2 endometrioid adenocarcinoma   01/02/2020 Initial Diagnosis   Endometrial carcinoma ([place])   02/08/2020 Treatment Plan Change   Mirena IUD placed   04/09/2021 Pathology Results   Endometrial biopsy: grade 2, endometrioid    07/18/2021 Surgery   Plan for definitive surgery but findings at time of surgery included tumor extending 1/3 to 1/2 of upper posterior vaginal wall, replacing cervix; uterus enlarged, firm and minimally mobile.  Vaginal biopsies performed   07/18/2021 Pathology Results   Vaginal biopsies: gr3 carcinoma, IHC pending   07/19/2021 Imaging   CT A/P: 1. Acute deep vein thrombosis in the left common femoral vein and adjacent external iliac vein. 2. Acute or subacute left femoral neck fracture, moderately displaced. Left hip effusion. Bony demineralization. 3. The endometrial mass has enlarged to nearly 5 times the size shown on  01/04/2021, likely with some early extra uterine extension. There is mild right external iliac adenopathy as well as a newly appreciable right middle lobe pulmonary nodule measuring at least 1.1 cm in diameter. 4. Prominent stool in the rectal vault suggesting impaction. Faint perirectal stranding may reflect stercoral colitis. 5. Gastroesophageal reflux.  Small hiatal hernia. 6. Other imaging findings of potential clinical significance: Aortic Atherosclerosis (ICD10-I70.0). Coronary atherosclerosis. Old granulomatous disease.    PAST MEDICAL HISTORY: Past Medical History:  Diagnosis Date   Edema    Edema of both legs 01/30/2016   Glucose-6-phosphate dehydrogenase deficiency    possible diagnosis of G6PD per patient "phosphate 6 deficiency"   Hypertension    Osteoarthritis    Pernicious anemia    Stroke ([place]) 09/28/2000   couldn't move her left foot which resolved    PAST SURGICAL HISTORY: Past Surgical History:  Procedure Laterality Date   BONE MARROW BIOPSY  12/1966   for anemia   IR PERC CHOLECYSTOSTOMY  04/08/2017   IR RADIOLOGIST EVAL & MGMT  05/20/2017   IR SINUS/FIST TUBE CHK-NON GI  06/11/2017   MULTIPLE TOOTH EXTRACTIONS     OTHER SURGICAL HISTORY     cyst from panty line removed as child   VAGINAL DELIVERY N/A 07/18/2021   Procedure: VAGINAL BIOPSY;  Surgeon: Lafonda Mosses, MD;  Location: [facility];  Service: Gynecology;  Laterality: N/A;   MEDICATIONS:  Current Facility-Administered Medications:    acetaminophen (TYLENOL) tablet 650 mg, 650  mg, Oral, Q6H PRN, 650 mg at 07/21/21 0935 **OR** acetaminophen (TYLENOL) suppository 650 mg, 650 mg, Rectal, Q6H PRN, MacNeil, Richard G, DO   albuterol (PROVENTIL) (2.5 MG/3ML) 0.083% nebulizer solution 2.5 mg, 2.5 mg, Nebulization, Q6H PRN, MacNeil, Richard G, DO   apixaban (ELIQUIS) tablet 10 mg, 10 mg, Oral, BID, 10 mg at 07/22/21 1104 **FOLLOWED BY** [START ON 07/28/2021] apixaban (ELIQUIS) tablet 5 mg, 5 mg, Oral, BID,  Ghimire, Kuber, MD   magnesium oxide (MAG-OX) tablet 800 mg, 800 mg, Oral, BID, Ghimire, Kuber, MD, 800 mg at 07/22/21 1104   ondansetron (ZOFRAN) tablet 4 mg, 4 mg, Oral, Q6H PRN **OR** ondansetron (ZOFRAN) injection 4 mg, 4 mg, Intravenous, Q6H PRN, MacNeil, Richard G, DO   sodium chloride flush (NS) 0.9 % injection 10-40 mL, 10-40 mL, Intracatheter, Q12H, MacNeil, Richard G, DO, 10 mL at 07/21/21 2231   sodium chloride flush (NS) 0.9 % injection 10-40 mL, 10-40 mL, Intracatheter, PRN, Luna Fuse, Richard G, DO  ALLERGIES: Allergies  Allergen Reactions   Asa [Aspirin] Other (See Comments)    Doctor told her not to take due to type anemia   Phosphate Other (See Comments)    Anything containing phosphate 6. Unknown reaction   Codeine Other (See Comments)    Made her too sleepy   Penicillins Itching and Swelling    Tolerates keflex  Did it involve swelling of the face/tongue/throat, SOB, or low BP? unknown Did it involve sudden or severe rash/hives, skin peeling, or any reaction on the inside of your mouth or nose? unknown Did you need to seek medical attention at a hospital or doctor's office? unknown When did it last happen?   years ago.    If all above answers are "NO", may proceed with cephalosporin use.    Sodium Phosphate Other (See Comments)    unknown   Sulfa Antibiotics Hives    FAMILY HISTORY: Family History  Problem Relation Age of Onset   Diabetes Brother    Diabetes Brother    Heart failure Mother 42   Heart attack Father 10    SOCIAL HISTORY: Social History   Socioeconomic History   Marital status: Single    Spouse name: Not on file   Number of children: Not on file   Years of education: Not on file   Highest education level: Not on file  Occupational History   Not on file  Tobacco Use   Smoking status: Former    Types: Cigarettes    Quit date: 12/29/1993    Years since quitting: 27.5   Smokeless tobacco: Never  Vaping Use   Vaping Use: Never used   Substance and Sexual Activity   Alcohol use: No   Drug use: No   Sexual activity: Not Currently  Other Topics Concern   Not on file  Social History Narrative   Live alone in house and uses a cane and wheelchair to get around.  She has not been upstairs in over a year.  She does not drive, but she uses a cab or bus to go to grocery store, but usually friends and family bring her things.  Niece and nephew in [place].     Social Determinants of Health   Financial Resource Strain: Not on file  Food Insecurity: Not on file  Transportation Needs: Not on file  Physical Activity: Not on file  Stress: Not on file  Social Connections: Not on file  Intimate Partner Violence: Not on file    PHYSICAL EXAM:  BP (!) 121/109 (BP Location: Right Wrist)   Pulse 82   Temp 98.9 F (37.2 C) (Oral)   Resp 20   SpO2 100%  General: Alert, oriented, no acute distress. HEENT: Normocephalic, atraumatic.  Sclera anicteric. Chest: Clear to auscultation bilaterally. No wheezes or rhonchi. Cardiovascular: Regular rate and rhythm, no murmurs, rubs, or gallops. Abdomen: Normoactive bowel sounds.  Soft, nondistended, nontender to palpation.  Fullness noted in mid-pelvis. No palpable fluid wave.   Extremities: Significant edema bilaterally and flaking skin. Very minimal mobility of lower extremities bilaterally.  LABORATORY AND RADIOLOGIC DATA: CBC    Component Value Date/Time   WBC 11.8 (H) 07/22/2021 0442   RBC 3.42 (L) 07/22/2021 0442   HGB 8.7 (L) 07/22/2021 0442   HGB 9.9 (L) 07/11/2021 1132   HCT 27.4 (L) 07/22/2021 0442   PLT 504 (H) 07/22/2021 0442   PLT 780 (H) 07/11/2021 1132   MCV 80.1 07/22/2021 0442   MCH 25.4 (L) 07/22/2021 0442   MCHC 31.8 07/22/2021 0442   RDW 24.2 (H) 07/22/2021 0442   LYMPHSABS 1.2 07/19/2021 2001   MONOABS 0.7 07/19/2021 2001   EOSABS 0.0 07/19/2021 2001   BASOSABS 0.0 07/19/2021 2001   CMP Latest Ref Rng & Units 07/20/2021 07/19/2021 07/03/2021  Glucose 70 - 99  mg/dL 123(H) 114(H) 166(H)  BUN 8 - 23 mg/dL 14 15 15   Creatinine 0.44 - 1.00 mg/dL 0.49 0.38(L) 0.76  Sodium 135 - 145 mmol/L 134(L) 134(L) 139  Potassium 3.5 - 5.1 mmol/L 3.1(L) 3.0(L) 3.3(L)  Chloride 98 - 111 mmol/L 103 101 107  CO2 22 - 32 mmol/L 23 23 24   Calcium 8.9 - 10.3 mg/dL 8.8(L) 8.9 8.8(L)  Total Protein 6.5 - 8.1 g/dL 7.0 7.4 8.0  Total Bilirubin 0.3 - 1.2 mg/dL 0.8 0.5 0.7  Alkaline Phos 38 - 126 U/L 87 84 97  AST 15 - 41 U/L 16 19 19   ALT 0 - 44 U/L 10 11 8    Recent biopsy and CT information from end of last week is in treatment summary above  ASSESSMENT AND PLAN: DRENDA SOBECKI is a 87 y.o. woman with metastatic poorly differentiated endometrial cancer with significant change in disease (previously treated with progestin with a Mirena IUD given comobidities) over the last 3 months since biopsy in 06/7411 now complicated by acute lower extremity VTE and left femur fracture.  I discussed with the patient findings from her surgery last week as well as events that caused her admission to the hospital.  Based on exam findings on Thursday, I did not feel that she was a surgical candidate.  I am concerned given her overall health status as well as metastatic disease that she is not a candidate for cancer directed therapy at this time.  I am recommending focusing on symptom management with the support of hospice.   I also spoke with the patient's daughter this morning.  We reviewed results from vaginal biopsies on Thursday, CT findings from Friday, and events over the weekend during hospitalization.  Given her mother's condition and medical comorbidities as well as extent of disease and now acute VTE and possibly pathologic fracture, I recommend that we move toward symptom directed care.  The patient's case was discussed at our tumor board this morning.  There may be a role for palliative radiation in the future if she has increase in her vaginal bleeding.  I recommend working to get  hospice set up for home.  The patient is unsure where her  mother will be, but has discussed with her family moving the patient up to the [place] area to be closer to them.  She is going to talk with family and I will call her tomorrow morning to check-in.  She is aware that palliative care consult has been requested.  In terms of anticoagulation, we discussed the risks and benefits of the patient being discharged on anticoagulation.  If discharged on hospice, then I think it is reasonable for her to go with or without anticoagulation.  It would be reasonable to consider prophylactic dosing of her anticoagulation to help prevent further VTE but decrease the risk of significant bleeding.  The above assessment and plan was communicated to the patient's primary team.  Valarie Cones, MD

## 2021-07-22 NOTE — Progress Notes (Signed)
Gynecologic Oncology Multi-Disciplinary Disposition Conference Note  Date of the Conference: 07/22/2021  Patient Name: Connie Miranda  Primary GYN Oncologist: Dr. Berline Lopes  Stage/Disposition:  At least stage IIIB endometrioid endometrial adenocarcinoma.  Disposition is for a possible short course of radiation in the future if patient starts to have vaginal bleeding.  Also consideration for a hospice referral..   This Multidisciplinary conference took place involving physicians from [place], Medical Oncology, Radiation Oncology, Pathology, Radiology along with the Gynecologic Oncology Nurse Practitioner and RN.  Comprehensive assessment of the patient's malignancy, staging, need for surgery, chemotherapy, radiation therapy, and need for further testing were reviewed. Supportive measures, both inpatient and following discharge were also discussed. The recommended plan of care is documented. Greater than 35 minutes were spent correlating and coordinating this patient's care.

## 2021-07-22 NOTE — Progress Notes (Signed)
PROGRESS NOTE    Connie Miranda  [id number] DOB: Apr 29, 1933 DOA: 07/19/2021 PCP: Clovia Cuff, MD    Brief Narrative:  87 year old female with history of endometrial carcinoma previously treated with [place], hypertension, previous stroke, extreme debility and bedbound brought to the ER with abnormal CT scan of the pelvis showing multiple blood clots and a femoral vein, left femoral neck fracture and worsening endometrial cancer spread.  Started on heparin infusion and admitted to the hospital.   Assessment & Plan:   Active Problems:   Hypertension   Hypokalemia   Endometrial carcinoma (HCC)   DVT (deep venous thrombosis) (HCC)   Unspecified atrial fibrillation (HCC)   Closed left hip fracture, initial encounter ([place])  #1. metastatic endometrial cancer now with thromboembolism and suspected pathological left hip fracture: Patient with extreme debility, bedbound status. And nebulized oncology recommended hospice.  See goal of care discussions below.  Palliative care consulted and updated patient's family.   #2. extensive left-sided lower extremity DVTs: Due to metastatic cancer.  Initiated on heparin.  Started on Eliquis and tolerating well so far.  Discontinue Plavix.  We can continue Eliquis unless she has vaginal bleeding or other complications.   Patient has poor chances of surviving long-term, we have agreed can even discontinue Eliquis.  #3. nontraumatic left hip fracture: Suspected pathological fracture.  Bedbound patient.  Seen by orthopedics.  Recommended nonoperative treatment, bed transfers but no weightbearing.  Patient does not walk.  Adequate pain management.  Since she is mostly bedbound, she is just using Tylenol.   #4. new onset A. fib: Paroxysmal A. fib.  Found to be rate controlled A. fib on admission.  Probably old.  Currently rate controlled.  Started on anticoagulation.  No further therapy needed.  Sinus rhythm now.   #5. Hypokalemia/hypomagnesemia:  Replaced with improvement.  #6. goal of care discussion: Patient with extreme debility, metastatic cancer, spreading thromboembolism, metastatic fracture.  At end-of-life. I have called and discussed this case with patient's daughter Ms. [name] who is her healthcare power of attorney and updated. Discussed with gynecology oncology today. Family agreeable to meet with palliative care team, consult placed. If everybody agrees, she will be best served with home with hospice. Discharge pending palliation/hospice discussion.   DVT prophylaxis: SCDs Start: 07/20/21 0058 apixaban (ELIQUIS) tablet 10 mg  apixaban (ELIQUIS) tablet 5 mg   Code Status: Full code Family Communication: None today. Disposition Plan: Status is: Inpatient  Remains inpatient appropriate because:Inpatient level of care appropriate due to severity of illness  Dispo: The patient is from: Home              Anticipated d/c is to:  Anticipate home with home hospice              Patient currently is not medically stable to d/c.   Difficult to place patient No         Consultants:  Orthopedics Palliative   Procedures:  None  Antimicrobials:  None   Subjective: Patient seen and examined.  She tells me that she is comfortable and does not have any problems.  Objective: Vitals:   07/21/21 0634 07/21/21 1230 07/21/21 2151 07/22/21 0621  BP: (!) 145/108 (!) 176/84 (!) 160/66 (!) 121/109  Pulse: 92 74 93 82  Resp: '20 18 20 20  '$ Temp:  98 F (36.7 C) 98.7 F (37.1 C) 98.9 F (37.2 C)  TempSrc:  Oral Oral Oral  SpO2: 99% 100% 100% 100%    Intake/Output Summary (Last 24  hours) at 07/22/2021 1258 Last data filed at 07/22/2021 0900 Gross per 24 hour  Intake 120 ml  Output 300 ml  Net -180 ml   There were no vitals filed for this visit.  Examination:  General: Chronically sick looking and debilitated.  Not in any distress.  Pleasant and conversant. Cardiovascular: S1-S2 normal.  Regular rate  rhythm. Respiratory: Bilateral clear.  On room air. Gastrointestinal: Soft and nontender.  Bowel sounds present. Ext: Chronic lymphedema bilateral leg.  Placidity.  Venous stasis changes and pigmentation present.  Hip mobility not done because of known fracture.      Data Reviewed: I have personally reviewed following labs and imaging studies  CBC: Recent Labs  Lab 07/19/21 2001 07/20/21 0320 07/21/21 0433 07/22/21 0442  WBC 16.2* 13.7* 11.8* 11.8*  NEUTROABS 14.2*  --   --   --   HGB 8.8* 8.4* 7.8* 8.7*  HCT 28.5* 26.9* 25.4* 27.4*  MCV 82.6 80.5 82.5 80.1  PLT 586* 568* 540* [id number]*   Basic Metabolic Panel: Recent Labs  Lab 07/19/21 2018 07/20/21 0320  NA 134* 134*  K 3.0* 3.1*  CL 101 103  CO2 23 23  GLUCOSE 114* 123*  BUN 15 14  CREATININE 0.38* 0.49  CALCIUM 8.9 8.8*  MG 1.8 1.7  PHOS  --  2.0*   GFR: Estimated Creatinine Clearance: 50 mL/min (by C-G formula based on SCr of 0.49 mg/dL). Liver Function Tests: Recent Labs  Lab 07/19/21 2018 07/20/21 0320  AST 19 16  ALT 11 10  ALKPHOS 84 87  BILITOT 0.5 0.8  PROT 7.4 7.0  ALBUMIN 2.6* 2.3*   No results for input(s): LIPASE, AMYLASE in the last 168 hours. No results for input(s): AMMONIA in the last 168 hours. Coagulation Profile: Recent Labs  Lab 07/19/21 2001  INR 0.9   Cardiac Enzymes: No results for input(s): CKTOTAL, CKMB, CKMBINDEX, TROPONINI in the last 168 hours. BNP (last 3 results) No results for input(s): PROBNP in the last 8760 hours. HbA1C: No results for input(s): HGBA1C in the last 72 hours. CBG: No results for input(s): GLUCAP in the last 168 hours. Lipid Profile: No results for input(s): CHOL, HDL, LDLCALC, TRIG, CHOLHDL, LDLDIRECT in the last 72 hours. Thyroid Function Tests: No results for input(s): TSH, T4TOTAL, FREET4, T3FREE, THYROIDAB in the last 72 hours. Anemia Panel: Recent Labs    07/20/21 1014  FERRITIN 65  TIBC 208*  IRON 24*   Sepsis Labs: No results for  input(s): PROCALCITON, LATICACIDVEN in the last 168 hours.  Recent Results (from the past 240 hour(s))  SARS Coronavirus 2 by RT PCR (hospital order, performed in [facility] hospital lab) Nasopharyngeal Nasopharyngeal Swab     Status: None   Collection Time: 07/18/21  8:08 AM   Specimen: Nasopharyngeal Swab  Result Value Ref Range Status   SARS Coronavirus 2 NEGATIVE NEGATIVE Final    Comment: (NOTE) SARS-CoV-2 target nucleic acids are NOT DETECTED.  The SARS-CoV-2 RNA is generally detectable in upper and lower respiratory specimens during the acute phase of infection. The lowest concentration of SARS-CoV-2 viral copies this assay can detect is 250 copies / mL. A negative result does not preclude SARS-CoV-2 infection and should not be used as the sole basis for treatment or other patient management decisions.  A negative result may occur with improper specimen collection / handling, submission of specimen other than nasopharyngeal swab, presence of viral mutation(s) within the areas targeted by this assay, and inadequate number of viral copies (<  250 copies / mL). A negative result must be combined with clinical observations, patient history, and epidemiological information.  Fact Sheet for Patients:   StrictlyIdeas.no  Fact Sheet for Healthcare Providers: BankingDealers.co.za  This test is not yet approved or  cleared by the [place] FDA and has been authorized for detection and/or diagnosis of SARS-CoV-2 by FDA under an Emergency Use Authorization (EUA).  This EUA will remain in effect (meaning this test can be used) for the duration of the COVID-19 declaration under Section 564(b)(1) of the Act, 21 U.S.C. section 360bbb-3(b)(1), unless the authorization is terminated or revoked sooner.  Performed at [facility], [place] [address], [place], [place] [address]   Resp Panel by RT-PCR (Flu A&B, Covid)  Nasopharyngeal Swab     Status: None   Collection Time: 07/19/21  8:01 PM   Specimen: Nasopharyngeal Swab; Nasopharyngeal(NP) swabs in vial transport medium  Result Value Ref Range Status   SARS Coronavirus 2 by RT PCR NEGATIVE NEGATIVE Final    Comment: (NOTE) SARS-CoV-2 target nucleic acids are NOT DETECTED.  The SARS-CoV-2 RNA is generally detectable in upper respiratory specimens during the acute phase of infection. The lowest concentration of SARS-CoV-2 viral copies this assay can detect is 138 copies/mL. A negative result does not preclude SARS-Cov-2 infection and should not be used as the sole basis for treatment or other patient management decisions. A negative result may occur with  improper specimen collection/handling, submission of specimen other than nasopharyngeal swab, presence of viral mutation(s) within the areas targeted by this assay, and inadequate number of viral copies(<138 copies/mL). A negative result must be combined with clinical observations, patient history, and epidemiological information. The expected result is Negative.  Fact Sheet for Patients:  EntrepreneurPulse.com.au  Fact Sheet for Healthcare Providers:  IncredibleEmployment.be  This test is no t yet approved or cleared by the [place] FDA and  has been authorized for detection and/or diagnosis of SARS-CoV-2 by FDA under an Emergency Use Authorization (EUA). This EUA will remain  in effect (meaning this test can be used) for the duration of the COVID-19 declaration under Section 564(b)(1) of the Act, 21 U.S.C.section 360bbb-3(b)(1), unless the authorization is terminated  or revoked sooner.       Influenza A by PCR NEGATIVE NEGATIVE Final   Influenza B by PCR NEGATIVE NEGATIVE Final    Comment: (NOTE) The Xpert Xpress SARS-CoV-2/FLU/RSV plus assay is intended as an aid in the diagnosis of influenza from Nasopharyngeal swab specimens and should not be  used as a sole basis for treatment. Nasal washings and aspirates are unacceptable for Xpert Xpress SARS-CoV-2/FLU/RSV testing.  Fact Sheet for Patients: EntrepreneurPulse.com.au  Fact Sheet for Healthcare Providers: IncredibleEmployment.be  This test is not yet approved or cleared by the [place] FDA and has been authorized for detection and/or diagnosis of SARS-CoV-2 by FDA under an Emergency Use Authorization (EUA). This EUA will remain in effect (meaning this test can be used) for the duration of the COVID-19 declaration under Section 564(b)(1) of the Act, 21 U.S.C. section 360bbb-3(b)(1), unless the authorization is terminated or revoked.  Performed at [facility], [place] [address], [place], [place] [address]          Radiology Studies: No results found.      Scheduled Meds:  apixaban  10 mg Oral BID   Followed by   Derrill Memo ON 07/28/2021] apixaban  5 mg Oral BID   magnesium oxide  800 mg Oral BID   sodium  chloride flush  10-40 mL Intracatheter Q12H   Continuous Infusions:   LOS: 3 days    Time spent: 30 minutes    Barb Merino, MD [facility] Pager [phone]

## 2021-07-23 DIAGNOSIS — S72002A Fracture of unspecified part of neck of left femur, initial encounter for closed fracture: Secondary | ICD-10-CM | POA: Diagnosis not present

## 2021-07-23 DIAGNOSIS — C541 Malignant neoplasm of endometrium: Secondary | ICD-10-CM | POA: Diagnosis not present

## 2021-07-23 DIAGNOSIS — I824Y2 Acute embolism and thrombosis of unspecified deep veins of left proximal lower extremity: Secondary | ICD-10-CM | POA: Diagnosis not present

## 2021-07-23 MED ORDER — ACETAMINOPHEN 500 MG PO TABS
1000.0000 mg | ORAL_TABLET | Freq: Three times a day (TID) | ORAL | Status: DC
  Administered 2021-07-23 – 2021-07-24 (×3): 1000 mg via ORAL
  Filled 2021-07-23 (×3): qty 2

## 2021-07-23 MED ORDER — SENNOSIDES-DOCUSATE SODIUM 8.6-50 MG PO TABS
1.0000 | ORAL_TABLET | Freq: Every day | ORAL | Status: DC
  Administered 2021-07-23: 1 via ORAL
  Filled 2021-07-23: qty 1

## 2021-07-23 MED ORDER — MORPHINE SULFATE (CONCENTRATE) 10 MG/0.5ML PO SOLN
10.0000 mg | ORAL | Status: DC | PRN
  Administered 2021-07-23: 10 mg via SUBLINGUAL
  Filled 2021-07-23: qty 0.5

## 2021-07-23 MED ORDER — MELOXICAM 7.5 MG PO TABS
7.5000 mg | ORAL_TABLET | Freq: Every day | ORAL | Status: DC
  Administered 2021-07-23: 7.5 mg via ORAL
  Filled 2021-07-23 (×2): qty 1

## 2021-07-23 NOTE — Progress Notes (Signed)
[facility])  Referral received for hospice services.  Spoke with dtr Ronney Lion, discussed hospice, provided support and answered questions.  Pt currently has her own hospital bed, WC, and hoyer lift.  Family requests an air overlay mattress and over bed table to be delivered prior to dc home.  [facility] will order.  Pt will require ambulance transport home.  If needed, please arrange for any comfort scripts to be sent to her pharmacy so there is no lapse in her comfort prior to hospice services beginning.  Thank you, Venia Carbon RN, BSN, [facility] Liaison

## 2021-07-23 NOTE — Progress Notes (Signed)
Palliative Care Consultation Note  87 year old woman with metastatic uterine cancer admitted with VTE and probable pathologic femur fracture. Given the extent of her disease and advanced age hospice care has been recommended and a referral has been placed. Family have agreed to initiation of hospice services at discharge. I was asked to advise on pain and symptom management prior to discharge tomorrow.  Connie Miranda is sitting in bed eating a banana, she is pleasant and has good insight into her condition. She is quite sharp mentally. She does endorse significant amounts of pain in her pelvis and especially in her left hip. This pain is tolerable when she is in bed and lying still-nut is severe when she has to be moved or turned. She is non-weight bearing and debilitated at baseline. Non operative management of pathologic hip fracture. She tells me her pain is still very bad and she would like this treated.  Goals of Care:  To return to her home here in [place], she has ATC caregivers and good support. Her daughter lives in [place] but is very involved with her care. DNR Home with Hospice Services Focus on comfort and QOL, desires better pain control. Her appetite is good and she is mentally sharp. Her limited mobility will require 24/7 caregivers- she tells me she has good care at home. She understands that her cancer has spread and likely is the cause of her fracture. I explained hospice care to her.  Recommendations for Pain and Symptom Management:  Scheduled Tylenol '1000mg'$  TID Roxanol '10mg'$  q2 prn pain Meloxicam 7.'5mg'$  QHS If bone pain becomes more severe I recommend starting decadron. 5.   [place]-S QHS  Lane Hacker, DO Palliative Medicine  Time: 30 min Greater than 50%  of this time was spent counseling and coordinating care related to the above assessment and plan.

## 2021-07-23 NOTE — TOC Initial Note (Signed)
Transition of Care [facility]) - Initial/Assessment Note    Patient Details  Name: Connie Miranda MRN: [id number] Date of Birth: [birth year]  Transition [facility]) CM/SW Contact:    Dessa Phi, RN Phone Number: 07/23/2021, 1:16 PM  Clinical Narrative: Referral for home hospice choice-spoke to dtr Bettiw about referral-she agrees to talk to liason with [facility]-rep Jennifer-aware-await outcome. PTAR needed @ d/c for home.                 Expected Discharge Plan: Home w Hospice Care Barriers to Discharge: Continued Medical Work up   Patient Goals and CMS Choice Patient states their goals for this hospitalization and ongoing recovery are:: Go home CMS Medicare.gov Compare Post Acute Care list provided to:: Patient Represenative (must comment) ([name] dtr [phone]) Choice offered to / list presented to : Adult Children  Expected Discharge Plan and Services Expected Discharge Plan: Home w Hospice Care   Discharge Planning Services: CM Consult Post Acute Care Choice: Hospice Living arrangements for the past 2 months: Single Family Home                                      Prior Living Arrangements/Services Living arrangements for the past 2 months: Single Family Home Lives with:: Adult Children Patient language and need for interpreter reviewed:: Yes Do you feel safe going back to the place where you live?: Yes      Need for Family Participation in Patient Care: No (Comment) Care giver support system in place?: Yes (comment) Current home services: DME (hospital bed,hoyer lift,w/c) Criminal Activity/Legal Involvement Pertinent to Current Situation/Hospitalization: No - Comment as needed  Activities of Daily Living      Permission Sought/Granted   Permission granted to share information with : Yes, Verbal Permission Granted  Share Information with NAME: Case Manager           Emotional Assessment Appearance:: Appears stated  age Attitude/Demeanor/Rapport: Gracious Affect (typically observed): Accepting Orientation: : Oriented to Self Alcohol / Substance Use: Not Applicable Psych Involvement: No (comment)  Admission diagnosis:  DVT (deep venous thrombosis) (HCC) [I82.409] Closed fracture of neck of left femur, initial encounter ([place]) [S72.002A] Acute deep vein thrombosis (DVT) of femoral vein of left lower extremity ([place]) [I82.412] Patient Active Problem List   Diagnosis Date Noted   Unspecified atrial fibrillation ([place]) 07/20/2021   Closed left hip fracture, initial encounter ([place]) 07/20/2021   DVT (deep venous thrombosis) ([place]) 07/19/2021   Endometrial cancer ([place]) 07/18/2021   Leukocytosis 07/12/2021   Pressure injury of skin 01/06/2020   Endometrial carcinoma ([place]) 01/03/2020   Thickened endometrium    Shock circulatory ([place]) 12/24/2019   Chronic diastolic CHF (congestive heart failure) ([place]) 06/10/2018   Sepsis ([place]) 06/10/2018   Acute respiratory failure with hypoxia ([place]) 06/10/2018   Hypokalemia 06/10/2018   Acute bronchitis 06/10/2018   Lower extremity weakness 02/11/2016   Difficulty walking 04/08/2013   Weakness of both legs 04/08/2013   Morbid obesity ([place]) 04/08/2013   AKI (acute kidney injury) ([place]) 01/28/2013   Hyperkalemia 01/28/2013   Fall 01/28/2013   Nausea 01/28/2013   Lymphedema 10/16/2012   Cellulitis 10/16/2012   Diarrhea 10/13/2012   Cellulitis of left lower leg 10/12/2012   Anemia 10/12/2012   Abdominal pain 10/12/2012   Hip pain 10/12/2012   Hypertension    Edema    Pernicious anemia    Glucose-6-phosphate dehydrogenase  deficiency    Osteoarthritis    Stroke ([place]) 09/28/2000   PCP:  Clovia Cuff, MD Pharmacy:   [place], [address] [place] [address] Phone: [phone] Fax: [phone]  [employer] [place] [address] Phone:  [phone] Fax: [phone]     Social Determinants of Health (SDOH) Interventions    Readmission Risk Interventions Readmission Risk Prevention Plan 01/06/2020  Transportation Screening Complete  PCP or Specialist Appt within 3-5 Days Complete  HRI or [place] Complete  Social Work Consult for [place] Planning/Counseling Complete  Palliative Care Screening Not Complete  Medication Review Press photographer) Complete  Some recent data might be hidden

## 2021-07-23 NOTE — Progress Notes (Signed)
Brief update  Spoke with the patient's daughter and healthcare power of attorney again this morning.  It sounds like she and the family are leaning towards setting up hospice here locally with potentially a plan to move her closer to the [place] area at some point in the future.  I let her know that I would communicate this to the primary team and whoever was seeing her from the palliative care service.  Valarie Cones MD

## 2021-07-23 NOTE — TOC Transition Note (Signed)
Transition of Care [facility]) - CM/SW Discharge Note   Patient Details  Name: [name] MRN: [id number] Date of Birth: 06/08/1933  [facility]) CM/SW Contact:  Dessa Phi, RN Phone Number: 07/23/2021, 1:45 PM   Clinical Narrative:  [facility] care accepted rep Cristy Hilts will order dme. For PTAR d/c in am.     Final next level of care: Home w Hospice Care Barriers to Discharge: No Barriers Identified   Patient Goals and CMS Choice Patient states their goals for this hospitalization and ongoing recovery are:: Go home CMS Medicare.gov Compare Post Acute Care list provided to:: Patient Represenative (must comment) ([name] dtr [phone]) Choice offered to / list presented to : Adult Children  Discharge Placement                       Discharge Plan and Services   Discharge Planning Services: CM Consult Post Acute Care Choice: Hospice                    HH Arranged: RN [facility] Agency: Hospice and [place] Date [place]: 07/23/21 Time [place]: [place] Representative spoke with at [place]: [place] (SDOH) Interventions     Readmission Risk Interventions Readmission Risk Prevention Plan 01/06/2020  Transportation Screening Complete  PCP or Specialist Appt within 3-5 Days Complete  HRI or [place] Complete  Social Work Consult for [place] Planning/Counseling Complete  Palliative Care Screening Not Complete  Medication Review Press photographer) Complete  Some recent data might be hidden

## 2021-07-23 NOTE — Progress Notes (Signed)
PROGRESS NOTE    TECORA Miranda  [id number] DOB: 1933/01/02 DOA: 07/19/2021 PCP: Clovia Cuff, MD    Brief Narrative:  87 year old female with history of endometrial carcinoma previously treated with [place], hypertension, previous stroke, extreme debility and bedbound brought to the ER with abnormal CT scan of the pelvis showing multiple blood clots and a femoral vein, left femoral neck fracture and worsening endometrial cancer spread.  Started on heparin infusion and admitted to the hospital.   Assessment & Plan:   Active Problems:   Hypertension   Hypokalemia   Endometrial carcinoma (HCC)   DVT (deep venous thrombosis) (HCC)   Unspecified atrial fibrillation (HCC)   Closed left hip fracture, initial encounter ([place])  #1. metastatic endometrial cancer now with thromboembolism and suspected pathological left hip fracture: Patient with extreme debility, bedbound status.   #2. extensive left-sided lower extremity DVTs: Due to metastatic cancer.  Initiated on heparin.  Started on Eliquis and tolerating well so far.    #3. nontraumatic left hip fracture: Suspected pathological fracture.  Bedbound patient.  Seen by orthopedics.  Recommended nonoperative treatment, bed transfers but no weightbearing.  Patient does not walk.  Adequate pain management.  Since she is mostly bedbound, she is just using Tylenol.   #4. new onset A. fib: Paroxysmal A. fib.  Found to be rate controlled A. fib on admission.  Probably old.  Currently rate controlled.  Started on anticoagulation.  No further therapy needed.  Sinus rhythm now.   #5. Hypokalemia/hypomagnesemia: Replaced with improvement.  Patient with advanced medical debilities and bedbound status, will not tolerate any cancer treatment.  Now with pathological fracture and thromboembolism pointing towards metabolically active metastatic cancer. Goal of care discussion with patient's family, her healthcare power of attorney Ms. [name]. Also  provided prognostic diagnosis by gynecology oncology to the patient and family. Agreed with home with home hospice program. We discussed about CODE STATUS, changed to DNR/DNI.  Paper in the chart. Family to arrange more care at home.  Once care arranged, discharge home with home hospice.  Will prescribe all symptom control medications until care can be resumed by hospice provider.   DVT prophylaxis: SCDs Start: 07/20/21 0058 apixaban (ELIQUIS) tablet 10 mg  apixaban (ELIQUIS) tablet 5 mg   Code Status: DNR. Family Communication: Daughter on the phone. Disposition Plan: Status is: Inpatient  Remains inpatient appropriate because:Inpatient level of care appropriate due to severity of illness  Dispo: The patient is from: Home              Anticipated d/c is to:  Anticipate home with home hospice              Patient currently is medically stable to discharge once home care arrangements can be made.   Difficult to place patient No         Consultants:  Orthopedics Palliative Hospice   Procedures:  None  Antimicrobials:  None   Subjective: Patient seen and examined.  Complains of some pain on the left hip.  Otherwise no other overnight events.  She thinks Tylenol will take care of it.  Objective: Vitals:   07/21/21 2151 07/22/21 0621 07/22/21 2110 07/23/21 0545  BP: (!) 160/66 (!) 121/109 (!) 148/81 (!) 145/91  Pulse: 93 82 81 88  Resp: '20 20 18 20  '$ Temp: 98.7 F (37.1 C) 98.9 F (37.2 C) 99.3 F (37.4 C) 99 F (37.2 C)  TempSrc: Oral Oral Oral Oral  SpO2: 100% 100% 100% 100%  Intake/Output Summary (Last 24 hours) at 07/23/2021 1048 Last data filed at 07/22/2021 2233 Gross per 24 hour  Intake 130 ml  Output --  Net 130 ml   There were no vitals filed for this visit.  Examination:  General: Chronically sick looking and debilitated.  Not in any distress.  On room air. Cardiovascular: S1-S2 normal.  No added sounds. Respiratory: Bilateral clear.  No  added sounds. Gastrointestinal: Soft and nontender.  Pendulous. Ext: Chronic bilateral lymphedematous, edematous legs and pigmentation changes.  Generalized debility and unable to move.  Slight tenderness on palpation left hip, range of motion not elicited due to known fracture.       Data Reviewed: I have personally reviewed following labs and imaging studies  CBC: Recent Labs  Lab 07/19/21 2001 07/20/21 0320 07/21/21 0433 07/22/21 0442  WBC 16.2* 13.7* 11.8* 11.8*  NEUTROABS 14.2*  --   --   --   HGB 8.8* 8.4* 7.8* 8.7*  HCT 28.5* 26.9* 25.4* 27.4*  MCV 82.6 80.5 82.5 80.1  PLT 586* 568* 540* [id number]*   Basic Metabolic Panel: Recent Labs  Lab 07/19/21 2018 07/20/21 0320  NA 134* 134*  K 3.0* 3.1*  CL 101 103  CO2 23 23  GLUCOSE 114* 123*  BUN 15 14  CREATININE 0.38* 0.49  CALCIUM 8.9 8.8*  MG 1.8 1.7  PHOS  --  2.0*   GFR: Estimated Creatinine Clearance: 50 mL/min (by C-G formula based on SCr of 0.49 mg/dL). Liver Function Tests: Recent Labs  Lab 07/19/21 2018 07/20/21 0320  AST 19 16  ALT 11 10  ALKPHOS 84 87  BILITOT 0.5 0.8  PROT 7.4 7.0  ALBUMIN 2.6* 2.3*   No results for input(s): LIPASE, AMYLASE in the last 168 hours. No results for input(s): AMMONIA in the last 168 hours. Coagulation Profile: Recent Labs  Lab 07/19/21 2001  INR 0.9   Cardiac Enzymes: No results for input(s): CKTOTAL, CKMB, CKMBINDEX, TROPONINI in the last 168 hours. BNP (last 3 results) No results for input(s): PROBNP in the last 8760 hours. HbA1C: No results for input(s): HGBA1C in the last 72 hours. CBG: No results for input(s): GLUCAP in the last 168 hours. Lipid Profile: No results for input(s): CHOL, HDL, LDLCALC, TRIG, CHOLHDL, LDLDIRECT in the last 72 hours. Thyroid Function Tests: No results for input(s): TSH, T4TOTAL, FREET4, T3FREE, THYROIDAB in the last 72 hours. Anemia Panel: No results for input(s): VITAMINB12, FOLATE, FERRITIN, TIBC, IRON, RETICCTPCT in  the last 72 hours.  Sepsis Labs: No results for input(s): PROCALCITON, LATICACIDVEN in the last 168 hours.  Recent Results (from the past 240 hour(s))  SARS Coronavirus 2 by RT PCR (hospital order, performed in [facility] hospital lab) Nasopharyngeal Nasopharyngeal Swab     Status: None   Collection Time: 07/18/21  8:08 AM   Specimen: Nasopharyngeal Swab  Result Value Ref Range Status   SARS Coronavirus 2 NEGATIVE NEGATIVE Final    Comment: (NOTE) SARS-CoV-2 target nucleic acids are NOT DETECTED.  The SARS-CoV-2 RNA is generally detectable in upper and lower respiratory specimens during the acute phase of infection. The lowest concentration of SARS-CoV-2 viral copies this assay can detect is 250 copies / mL. A negative result does not preclude SARS-CoV-2 infection and should not be used as the sole basis for treatment or other patient management decisions.  A negative result may occur with improper specimen collection / handling, submission of specimen other than nasopharyngeal swab, presence of viral mutation(s) within the areas targeted  by this assay, and inadequate number of viral copies (<250 copies / mL). A negative result must be combined with clinical observations, patient history, and epidemiological information.  Fact Sheet for Patients:   StrictlyIdeas.no  Fact Sheet for Healthcare Providers: BankingDealers.co.za  This test is not yet approved or  cleared by the [place] FDA and has been authorized for detection and/or diagnosis of SARS-CoV-2 by FDA under an Emergency Use Authorization (EUA).  This EUA will remain in effect (meaning this test can be used) for the duration of the COVID-19 declaration under Section 564(b)(1) of the Act, 21 U.S.C. section 360bbb-3(b)(1), unless the authorization is terminated or revoked sooner.  Performed at [facility], [place] [address] [place], [place] [address]    Resp Panel by RT-PCR (Flu A&B, Covid) Nasopharyngeal Swab     Status: None   Collection Time: 07/19/21  8:01 PM   Specimen: Nasopharyngeal Swab; Nasopharyngeal(NP) swabs in vial transport medium  Result Value Ref Range Status   SARS Coronavirus 2 by RT PCR NEGATIVE NEGATIVE Final    Comment: (NOTE) SARS-CoV-2 target nucleic acids are NOT DETECTED.  The SARS-CoV-2 RNA is generally detectable in upper respiratory specimens during the acute phase of infection. The lowest concentration of SARS-CoV-2 viral copies this assay can detect is 138 copies/mL. A negative result does not preclude SARS-Cov-2 infection and should not be used as the sole basis for treatment or other patient management decisions. A negative result may occur with  improper specimen collection/handling, submission of specimen other than nasopharyngeal swab, presence of viral mutation(s) within the areas targeted by this assay, and inadequate number of viral copies(<138 copies/mL). A negative result must be combined with clinical observations, patient history, and epidemiological information. The expected result is Negative.  Fact Sheet for Patients:  EntrepreneurPulse.com.au  Fact Sheet for Healthcare Providers:  IncredibleEmployment.be  This test is no t yet approved or cleared by the [place] FDA and  has been authorized for detection and/or diagnosis of SARS-CoV-2 by FDA under an Emergency Use Authorization (EUA). This EUA will remain  in effect (meaning this test can be used) for the duration of the COVID-19 declaration under Section 564(b)(1) of the Act, 21 U.S.C.section 360bbb-3(b)(1), unless the authorization is terminated  or revoked sooner.       Influenza A by PCR NEGATIVE NEGATIVE Final   Influenza B by PCR NEGATIVE NEGATIVE Final    Comment: (NOTE) The Xpert Xpress SARS-CoV-2/FLU/RSV plus assay is intended as an aid in the diagnosis of influenza from  Nasopharyngeal swab specimens and should not be used as a sole basis for treatment. Nasal washings and aspirates are unacceptable for Xpert Xpress SARS-CoV-2/FLU/RSV testing.  Fact Sheet for Patients: EntrepreneurPulse.com.au  Fact Sheet for Healthcare Providers: IncredibleEmployment.be  This test is not yet approved or cleared by the [place] FDA and has been authorized for detection and/or diagnosis of SARS-CoV-2 by FDA under an Emergency Use Authorization (EUA). This EUA will remain in effect (meaning this test can be used) for the duration of the COVID-19 declaration under Section 564(b)(1) of the Act, 21 U.S.C. section 360bbb-3(b)(1), unless the authorization is terminated or revoked.  Performed at [facility], [place] [address], [place], [place] [address]          Radiology Studies: No results found.      Scheduled Meds:  apixaban  10 mg Oral BID   Followed by   Derrill Memo ON 07/28/2021] apixaban  5 mg Oral BID   magnesium  oxide  800 mg Oral BID   sodium chloride flush  10-40 mL Intracatheter Q12H   Continuous Infusions:   LOS: 4 days    Time spent: 30 minutes    Barb Merino, MD [facility] Pager [phone]

## 2021-07-24 DIAGNOSIS — C541 Malignant neoplasm of endometrium: Secondary | ICD-10-CM | POA: Diagnosis not present

## 2021-07-24 DIAGNOSIS — S72002A Fracture of unspecified part of neck of left femur, initial encounter for closed fracture: Secondary | ICD-10-CM | POA: Diagnosis not present

## 2021-07-24 DIAGNOSIS — Z7189 Other specified counseling: Secondary | ICD-10-CM

## 2021-07-24 DIAGNOSIS — I824Y2 Acute embolism and thrombosis of unspecified deep veins of left proximal lower extremity: Secondary | ICD-10-CM | POA: Diagnosis not present

## 2021-07-24 MED ORDER — ACETAMINOPHEN 500 MG PO TABS: 1000.0000 mg | ORAL_TABLET | Freq: Three times a day (TID) | ORAL | 0 refills | Status: AC

## 2021-07-24 MED ORDER — MORPHINE SULFATE (CONCENTRATE) 10 MG/0.5ML PO SOLN: 10.0000 mg | ORAL | 0 refills | Status: AC | PRN

## 2021-07-24 MED ORDER — LORAZEPAM 0.5 MG PO TABS: 0.5000 mg | ORAL_TABLET | ORAL | 0 refills | Status: AC | PRN

## 2021-07-24 MED ORDER — ACETAMINOPHEN 500 MG PO TABS: 1000.0000 mg | ORAL_TABLET | Freq: Four times a day (QID) | ORAL | 0 refills | Status: DC

## 2021-07-24 MED ORDER — MELOXICAM 7.5 MG PO TABS: 7.5000 mg | ORAL_TABLET | Freq: Every day | ORAL | 0 refills | Status: AC

## 2021-07-24 MED ORDER — APIXABAN 5 MG PO TABS: 5.0000 mg | ORAL_TABLET | Freq: Two times a day (BID) | ORAL | 0 refills | Status: AC

## 2021-07-24 MED ORDER — ACETAMINOPHEN 500 MG PO TABS: 1000.0000 mg | ORAL_TABLET | Freq: Four times a day (QID) | ORAL | 0 refills | Status: DC | PRN

## 2021-07-24 NOTE — TOC Transition Note (Addendum)
Transition of Care [facility]) - CM/SW Discharge Note   Patient Details  Name: Connie Miranda MRN: [id number] Date of Birth: 09-28-1933  Transition of Care [facility]) CM/SW Contact:  Dessa Phi, RN Phone Number: 07/24/2021, 10:33 AM   Clinical Narrative:  Spoke to dtr Ronney Lion about d/c plans-d/c home confirmed address:[address] [place] [address]-Awaiting delivery of [place] dme-air mattress,& overbed table to deliver to home prior patient d/c. [name] dtr aware to contact hospital once dme delivered for safe [facility] transport home. [facility] care rep Anderson Malta will contact dtr [name] for dme arrangements. [facility] for transport once dme has arrived in home.  2:24p [place] care has delivered dme to home-tr [name] requesting 4p PTAR pick up-PTAR called for scheduled 4p pick up. No further CM needs.   Final next level of care: Home w Hospice Care Barriers to Discharge: No Barriers Identified   Patient Goals and CMS Choice Patient states their goals for this hospitalization and ongoing recovery are:: Go home CMS Medicare.gov Compare Post Acute Care list provided to:: Patient Represenative (must comment) ([name] dtr [phone]) Choice offered to / list presented to : Adult Children  Discharge Placement                Patient to be transferred to facility by: [place] Name of family member notified: [name] dtr [phone] Patient and family notified of of transfer: 07/24/21  Discharge Plan and Services   Discharge Planning Services: CM Consult Post Acute Care Choice: Hospice                    HH Arranged: RN [place] Date [place]: 07/23/21 Time [place]: 1345 Representative spoke with at [place]: [place] ([place]) Interventions     Readmission Risk Interventions Readmission Risk Prevention Plan 01/06/2020  Transportation Screening Complete  PCP or Specialist Appt within 3-5 Days Complete   HRI or [place] Complete  Social Work Consult for [place] Planning/Counseling [place] Not Complete  Medication Review Press photographer) Complete  Some recent data might be hidden

## 2021-07-24 NOTE — Progress Notes (Signed)
Manufacturing engineer (ACC)  DME was supposed to be delivered to home yesterday for anticipated dc this am.  [facility] manager contacted DME vendor and is attempted to get an ETA for delivery.  Venia Carbon RN, BSN, [facility] Liaison

## 2021-07-24 NOTE — Discharge Summary (Signed)
Physician Discharge Summary  Connie Miranda [id number] DOB: 23-Jul-1933 DOA: 07/19/2021  PCP: Clovia Cuff, MD  Admit date: 07/19/2021 Discharge date: 07/24/2021  Admitted From: Home Disposition: Home with home hospice  Recommendations for Outpatient Follow-up:  As per hospice plan  Home Health: Not applicable Equipment/Devices: Available with hospice program  Discharge Condition: Fair CODE STATUS: DNR/DNI Diet recommendation: Regular diet  Discharge summary: 87 year old female with history of endometrial carcinoma previously treated with Minerva, hypertension, previous stroke, extreme debility and bedbound brought to the ER with abnormal CT scan of the pelvis showing multiple blood clots in femoral vein, left femoral neck fracture and worsening endometrial cancer spread.  Started on heparin infusion and admitted to the hospital.  Metastatic endometrial carcinoma with thromboembolism, pathological fracture left hip. Extensive left-sided lower extremity DVT associated with cancer. Pathological left hip fracture Electrolyte abnormalities. Frailty and debility.  Plan: With overall advanced frailty and debility, bedbound status decided against continuing cancer related treatment.  Palliative radiation in the future if any endometrial bleeding. Tolerating anticoagulation and able to take by mouth, will continue Eliquis, will keep on maintenance dose 5 mg twice a day. Decided to go home with hospice, has maximum support at home. Patient prescribed pain medications per end-of-life care including morphine sublingual, Ativan as needed, NSAIDs and Tylenol. DNR/DNI. [facility] will resume hospice services at home today.   Discharge Diagnoses:  Active Problems:   Hypertension   Hypokalemia   Endometrial carcinoma (HCC)   DVT (deep venous thrombosis) (HCC)   Unspecified atrial fibrillation (HCC)   Closed fracture of neck of left femur (HCC)   Goals of care,  counseling/discussion    Discharge Instructions  Discharge Instructions     Diet general   Complete by: As directed    Increase activity slowly   Complete by: As directed    No wound care   Complete by: As directed       Allergies as of 07/24/2021       Reactions   Asa [aspirin] Other (See Comments)   Doctor told her not to take due to type anemia   Phosphate Other (See Comments)   Anything containing phosphate 6. Unknown reaction   Codeine Other (See Comments)   Made her too sleepy   Penicillins Itching, Swelling   Tolerates keflex Did it involve swelling of the face/tongue/throat, SOB, or low BP? unknown Did it involve sudden or severe rash/hives, skin peeling, or any reaction on the inside of your mouth or nose? unknown Did you need to seek medical attention at a hospital or doctor's office? unknown When did it last happen?   years ago.    If all above answers are "NO", may proceed with cephalosporin use.   Sodium Phosphate Other (See Comments)   unknown   Sulfa Antibiotics Hives        Medication List     STOP taking these medications    amLODipine 10 MG tablet Commonly known as: NORVASC   clopidogrel 75 MG tablet Commonly known as: PLAVIX   cyanocobalamin 1000 MCG/ML injection Commonly known as: (VITAMIN B-12)   levonorgestrel 20 MCG/24HR IUD Commonly known as: MIRENA       TAKE these medications    acetaminophen 500 MG tablet Commonly known as: TYLENOL Take 2 tablets (1,000 mg total) by mouth 3 (three) times daily. What changed:  how much to take when to take this reasons to take this   apixaban 5 MG Tabs tablet Commonly known as: ELIQUIS Take 1 tablet (  5 mg total) by mouth 2 (two) times daily.   CALCIUM + D PO Take 1 tablet by mouth 3 (three) times a week.   furosemide 40 MG tablet Commonly known as: Lasix Take 3 tablets (120 mg total) by mouth daily. What changed:  how much to take when to take this   isosorbide mononitrate  120 MG 24 hr tablet Commonly known as: IMDUR Take 120 mg by mouth daily in the afternoon.   liver oil-zinc oxide 40 % ointment Commonly known as: DESITIN Apply 1 application topically as needed for irritation (barrier protection (buttocks)).   LORazepam 0.5 MG tablet Commonly known as: ATIVAN Take 1 tablet (0.5 mg total) by mouth every 4 (four) hours as needed for anxiety. May crush, mix with water and give sublingually if needed.   Lubricant Eye Drops 0.4-0.3 % Soln Generic drug: Polyethyl Glycol-Propyl Glycol Place 1 drop into both eyes 3 (three) times daily as needed (dry/irritated eyes).   meloxicam 7.5 MG tablet Commonly known as: MOBIC Take 1 tablet (7.5 mg total) by mouth at bedtime.   morphine CONCENTRATE 10 MG/0.5ML Soln concentrated solution Place 0.5 mLs (10 mg total) under the tongue every 2 (two) hours as needed for moderate pain, severe pain, anxiety or shortness of breath.   multivitamin with minerals Tabs tablet Take 1 tablet by mouth daily. What changed: when to take this   polyethylene glycol 17 g packet Commonly known as: MIRALAX / GLYCOLAX Take 17 g by mouth 3 (three) times a week.   white petrolatum Gel Commonly known as: VASELINE Apply 1 application topically as needed for dry skin (lymphedema).        Allergies  Allergen Reactions   Asa [Aspirin] Other (See Comments)    Doctor told her not to take due to type anemia   Phosphate Other (See Comments)    Anything containing phosphate 6. Unknown reaction   Codeine Other (See Comments)    Made her too sleepy   Penicillins Itching and Swelling    Tolerates keflex  Did it involve swelling of the face/tongue/throat, SOB, or low BP? unknown Did it involve sudden or severe rash/hives, skin peeling, or any reaction on the inside of your mouth or nose? unknown Did you need to seek medical attention at a hospital or doctor's office? unknown When did it last happen?   years ago.    If all above answers  are "NO", may proceed with cephalosporin use.    Sodium Phosphate Other (See Comments)    unknown   Sulfa Antibiotics Hives    Consultations: Oncology gynecology Palliative medicine Orthopedics   Procedures/Studies: CT Abdomen Pelvis W Contrast  Addendum Date: 07/19/2021   ADDENDUM REPORT: 07/19/2021 17:23 ADDENDUM: The original report was by Dr. Van Clines. The following addendum is by Dr. Van Clines: Critical Value/emergent results were called by telephone at the time of interpretation on 07/19/2021 at 5:13 pm to provider Dr. Denman George, who verbally acknowledged these results. Electronically Signed   By: Van Clines M.D.   On: 07/19/2021 17:23   Result Date: 07/19/2021 CLINICAL DATA:  Endometrial adenocarcinoma.  Pelvic pain. EXAM: CT ABDOMEN AND PELVIS WITH CONTRAST TECHNIQUE: Multidetector CT imaging of the abdomen and pelvis was performed using the standard protocol following bolus administration of intravenous contrast. CONTRAST:  43m OMNIPAQUE IOHEXOL 350 MG/ML SOLN COMPARISON:  01/05/2020 FINDINGS: Despite efforts by the technologist and patient, motion artifact is present on today's exam and could not be eliminated. This reduces exam sensitivity and specificity.  Lower chest: The lower margin of a newly apparent right middle lobe pulmonary nodule measures 1.1 by 0.9 cm on image 1 series 7. This nodule is not fully included on today's exam and the non included portion may actually be larger. Contrast filled distal esophagus with patulous GE junction and small type 1 hiatal hernia, appearance compatible with gastroesophageal reflux. Right coronary artery and descending thoracic aortic atherosclerotic calcification. Hepatobiliary: Punctate calcifications in the liver compatible with old granulomatous disease. Contracted gallbladder, blurred by motion artifact. Pancreas: Unremarkable Spleen: Punctate calcifications from old granulomatous disease. Adrenals/Urinary Tract:  Hypodense right kidney upper pole lesion 0.7 by 0.6 cm on image 32 series 2 anteriorly, nonspecific. Similar sized left kidney upper pole lesion on image 26 series 2. These are likely cysts but technically too small to characterize. Urinary bladder and adrenal glands appear unremarkable. Stomach/Bowel: Small type 1 hiatal hernia. Distended rectum 9.4 cm transverse by 9.2 cm anterior-posterior, potentially from fecal impaction, with faint stranding in the perirectal space such that stercoral colitis cannot be excluded. Scattered diverticula of the descending colon. Vascular/Lymphatic: Aortoiliac atherosclerotic vascular disease. Right external iliac node 1.3 cm in short axis on image 61 series 2, formerly not readily apparent. Filling defect in the left common femoral artery and left external iliac artery compatible with acute deep vein thrombosis. Reproductive: Enlarging heterogeneous mass centrally in the uterus, with an IUD extending in the mass and a small amount of gas in the heterogeneously enhancing mass. This currently measures about 10.8 by 6.6 by 11.2 cm (volume = 420 cm^3) with suspicion for local extension beyond the myometrial margin of the uterus for example superiorly on image 49 series 6. This has substantially enlarged from prior measurement of 6.5 by 4.0 by 6.4 cm (volume = 87 cm^3) on 01/05/2020. Other: No supplemental non-categorized findings. Musculoskeletal: Acute or subacute left femoral neck fracture with left hip effusion. Striking bony demineralization. Lumbar spondylosis and degenerative disc disease with acquired interbody fusions at multiple levels. Foraminal impingement observed bilaterally at L4-5 and L5-S1. IMPRESSION: 1. Acute deep vein thrombosis in the left common femoral vein and adjacent external iliac vein. 2. Acute or subacute left femoral neck fracture, moderately displaced. Left hip effusion. Bony demineralization. 3. The endometrial mass has enlarged to nearly 5 times the  size shown on 01/04/2021, likely with some early extra uterine extension. There is mild right external iliac adenopathy as well as a newly appreciable right middle lobe pulmonary nodule measuring at least 1.1 cm in diameter. 4. Prominent stool in the rectal vault suggesting impaction. Faint perirectal stranding may reflect stercoral colitis. 5. Gastroesophageal reflux.  Small hiatal hernia. 6. Other imaging findings of potential clinical significance: Aortic Atherosclerosis (ICD10-I70.0). Coronary atherosclerosis. Old granulomatous disease. Radiology assistant personnel have been notified to put me in telephone contact with the referring physician or the referring physician's clinical representative in order to discuss these findings. Once this communication is established I will issue an addendum to this report for documentation purposes. Electronically Signed: By: Van Clines M.D. On: 07/19/2021 17:10   DG Hip Unilat W or Wo Pelvis 2-3 Views Left  Result Date: 07/20/2021 CLINICAL DATA:  Atraumatic left hip pain. EXAM: DG HIP (WITH OR WITHOUT PELVIS) 2-3V LEFT COMPARISON:  October 11, 2012 FINDINGS: An acute fracture deformity is seen extending through the neck of the proximal left femur. Approximately 1/2 shaft width dorsal displacement of the distal fracture site is noted. There is no evidence of dislocation. Of incidental note is the presence of an  extensive amount of contrast seen throughout the large bowel and urinary bladder. IMPRESSION: Acute fracture of the proximal left femur. Electronically Signed   By: Virgina Norfolk M.D.   On: 07/20/2021 00:40   (Echo, Carotid, EGD, Colonoscopy, ERCP)    Subjective: Patient seen and examined.  Was sleepy after eating.  No overnight events.  Remains fairly stable.  No pain at rest, it hurts on attempted movements.   Discharge Exam: Vitals:   07/23/21 2021 07/24/21 0624  BP: 136/63 (!) 124/48  Pulse: 69 69  Resp: 16 16  Temp: 98.4 F (36.9 C)  98.1 F (36.7 C)  SpO2: (!) 69% 95%   Vitals:   07/23/21 1348 07/23/21 2021 07/24/21 0523 07/24/21 0624  BP: 97/71 136/63  (!) 124/48  Pulse: 83 69  69  Resp: '18 16  16  '$ Temp: 99 F (37.2 C) 98.4 F (36.9 C)  98.1 F (36.7 C)  TempSrc: Oral Oral  Oral  SpO2: 100% (!) 69%  95%  Weight:   74.2 kg    General: Chronically sick looking and debilitated.  Not in any distress.  On room air. Cardiovascular: S1-S2 normal.  No added sounds. Respiratory: Bilateral clear.  No added sounds. Gastrointestinal: Soft and nontender.  Pendulous. Ext: Chronic bilateral lymphedematous, edematous legs and pigmentation changes.  Generalized debility and unable to move.  Slight tenderness on palpation left hip, range of motion not elicited due to known fracture.      The results of significant diagnostics from this hospitalization (including imaging, microbiology, ancillary and laboratory) are listed below for reference.     Microbiology: Recent Results (from the past 240 hour(s))  SARS Coronavirus 2 by RT PCR (hospital order, performed in [facility] hospital lab) Nasopharyngeal Nasopharyngeal Swab     Status: None   Collection Time: 07/18/21  8:08 AM   Specimen: Nasopharyngeal Swab  Result Value Ref Range Status   SARS Coronavirus 2 NEGATIVE NEGATIVE Final    Comment: (NOTE) SARS-CoV-2 target nucleic acids are NOT DETECTED.  The SARS-CoV-2 RNA is generally detectable in upper and lower respiratory specimens during the acute phase of infection. The lowest concentration of SARS-CoV-2 viral copies this assay can detect is 250 copies / mL. A negative result does not preclude SARS-CoV-2 infection and should not be used as the sole basis for treatment or other patient management decisions.  A negative result may occur with improper specimen collection / handling, submission of specimen other than nasopharyngeal swab, presence of viral mutation(s) within the areas targeted by this assay, and  inadequate number of viral copies (<250 copies / mL). A negative result must be combined with clinical observations, patient history, and epidemiological information.  Fact Sheet for Patients:   StrictlyIdeas.no  Fact Sheet for Healthcare Providers: BankingDealers.co.za  This test is not yet approved or  cleared by the [place] FDA and has been authorized for detection and/or diagnosis of SARS-CoV-2 by FDA under an Emergency Use Authorization (EUA).  This EUA will remain in effect (meaning this test can be used) for the duration of the COVID-19 declaration under Section 564(b)(1) of the Act, 21 U.S.C. section 360bbb-3(b)(1), unless the authorization is terminated or revoked sooner.  Performed at [facility], [place] [address], [place], [place] [address]   Resp Panel by RT-PCR (Flu A&B, Covid) Nasopharyngeal Swab     Status: None   Collection Time: 07/19/21  8:01 PM   Specimen: Nasopharyngeal Swab; Nasopharyngeal(NP) swabs in vial transport medium  Result Value  Ref Range Status   SARS Coronavirus 2 by RT PCR NEGATIVE NEGATIVE Final    Comment: (NOTE) SARS-CoV-2 target nucleic acids are NOT DETECTED.  The SARS-CoV-2 RNA is generally detectable in upper respiratory specimens during the acute phase of infection. The lowest concentration of SARS-CoV-2 viral copies this assay can detect is 138 copies/mL. A negative result does not preclude SARS-Cov-2 infection and should not be used as the sole basis for treatment or other patient management decisions. A negative result may occur with  improper specimen collection/handling, submission of specimen other than nasopharyngeal swab, presence of viral mutation(s) within the areas targeted by this assay, and inadequate number of viral copies(<138 copies/mL). A negative result must be combined with clinical observations, patient history, and epidemiological information. The  expected result is Negative.  Fact Sheet for Patients:  EntrepreneurPulse.com.au  Fact Sheet for Healthcare Providers:  IncredibleEmployment.be  This test is no t yet approved or cleared by the [place] FDA and  has been authorized for detection and/or diagnosis of SARS-CoV-2 by FDA under an Emergency Use Authorization (EUA). This EUA will remain  in effect (meaning this test can be used) for the duration of the COVID-19 declaration under Section 564(b)(1) of the Act, 21 U.S.C.section 360bbb-3(b)(1), unless the authorization is terminated  or revoked sooner.       Influenza A by PCR NEGATIVE NEGATIVE Final   Influenza B by PCR NEGATIVE NEGATIVE Final    Comment: (NOTE) The Xpert Xpress SARS-CoV-2/FLU/RSV plus assay is intended as an aid in the diagnosis of influenza from Nasopharyngeal swab specimens and should not be used as a sole basis for treatment. Nasal washings and aspirates are unacceptable for Xpert Xpress SARS-CoV-2/FLU/RSV testing.  Fact Sheet for Patients: EntrepreneurPulse.com.au  Fact Sheet for Healthcare Providers: IncredibleEmployment.be  This test is not yet approved or cleared by the [place] FDA and has been authorized for detection and/or diagnosis of SARS-CoV-2 by FDA under an Emergency Use Authorization (EUA). This EUA will remain in effect (meaning this test can be used) for the duration of the COVID-19 declaration under Section 564(b)(1) of the Act, 21 U.S.C. section 360bbb-3(b)(1), unless the authorization is terminated or revoked.  Performed at [facility], [place] [address], [place], [place] [address]      Labs: BNP (last 3 results) No results for input(s): BNP in the last 8760 hours. Basic Metabolic Panel: Recent Labs  Lab 07/19/21 2018 07/20/21 0320  NA 134* 134*  K 3.0* 3.1*  CL 101 103  CO2 23 23  GLUCOSE 114* 123*  BUN 15 14   CREATININE 0.38* 0.49  CALCIUM 8.9 8.8*  MG 1.8 1.7  PHOS  --  2.0*   Liver Function Tests: Recent Labs  Lab 07/19/21 2018 07/20/21 0320  AST 19 16  ALT 11 10  ALKPHOS 84 87  BILITOT 0.5 0.8  PROT 7.4 7.0  ALBUMIN 2.6* 2.3*   No results for input(s): LIPASE, AMYLASE in the last 168 hours. No results for input(s): AMMONIA in the last 168 hours. CBC: Recent Labs  Lab 07/19/21 2001 07/20/21 0320 07/21/21 0433 07/22/21 0442  WBC 16.2* 13.7* 11.8* 11.8*  NEUTROABS 14.2*  --   --   --   HGB 8.8* 8.4* 7.8* 8.7*  HCT 28.5* 26.9* 25.4* 27.4*  MCV 82.6 80.5 82.5 80.1  PLT 586* 568* 540* 504*   Cardiac Enzymes: No results for input(s): CKTOTAL, CKMB, CKMBINDEX, TROPONINI in the last 168 hours. BNP: Invalid input(s): POCBNP CBG: No results  for input(s): GLUCAP in the last 168 hours. D-Dimer No results for input(s): DDIMER in the last 72 hours. Hgb A1c No results for input(s): HGBA1C in the last 72 hours. Lipid Profile No results for input(s): CHOL, HDL, LDLCALC, TRIG, CHOLHDL, LDLDIRECT in the last 72 hours. Thyroid function studies No results for input(s): TSH, T4TOTAL, T3FREE, THYROIDAB in the last 72 hours.  Invalid input(s): FREET3 Anemia work up No results for input(s): VITAMINB12, FOLATE, FERRITIN, TIBC, IRON, RETICCTPCT in the last 72 hours. Urinalysis    Component Value Date/Time   COLORURINE YELLOW 07/11/2021 1216   APPEARANCEUR HAZY (A) 07/11/2021 1216   LABSPEC 1.014 07/11/2021 1216   PHURINE 5.0 07/11/2021 1216   GLUCOSEU NEGATIVE 07/11/2021 1216   HGBUR NEGATIVE 07/11/2021 1216   BILIRUBINUR NEGATIVE 07/11/2021 1216   KETONESUR NEGATIVE 07/11/2021 1216   PROTEINUR NEGATIVE 07/11/2021 1216   UROBILINOGEN 1.0 04/09/2013 0151   NITRITE NEGATIVE 07/11/2021 1216   LEUKOCYTESUR NEGATIVE 07/11/2021 1216   Sepsis Labs Invalid input(s): PROCALCITONIN,  WBC,  LACTICIDVEN Microbiology Recent Results (from the past 240 hour(s))  SARS Coronavirus 2 by RT  PCR (hospital order, performed in [facility] hospital lab) Nasopharyngeal Nasopharyngeal Swab     Status: None   Collection Time: 07/18/21  8:08 AM   Specimen: Nasopharyngeal Swab  Result Value Ref Range Status   SARS Coronavirus 2 NEGATIVE NEGATIVE Final    Comment: (NOTE) SARS-CoV-2 target nucleic acids are NOT DETECTED.  The SARS-CoV-2 RNA is generally detectable in upper and lower respiratory specimens during the acute phase of infection. The lowest concentration of SARS-CoV-2 viral copies this assay can detect is 250 copies / mL. A negative result does not preclude SARS-CoV-2 infection and should not be used as the sole basis for treatment or other patient management decisions.  A negative result may occur with improper specimen collection / handling, submission of specimen other than nasopharyngeal swab, presence of viral mutation(s) within the areas targeted by this assay, and inadequate number of viral copies (<250 copies / mL). A negative result must be combined with clinical observations, patient history, and epidemiological information.  Fact Sheet for Patients:   StrictlyIdeas.no  Fact Sheet for Healthcare Providers: BankingDealers.co.za  This test is not yet approved or  cleared by the [place] FDA and has been authorized for detection and/or diagnosis of SARS-CoV-2 by FDA under an Emergency Use Authorization (EUA).  This EUA will remain in effect (meaning this test can be used) for the duration of the COVID-19 declaration under Section 564(b)(1) of the Act, 21 U.S.C. section 360bbb-3(b)(1), unless the authorization is terminated or revoked sooner.  Performed at [facility], [place] [address], [place], [place] [address]   Resp Panel by RT-PCR (Flu A&B, Covid) Nasopharyngeal Swab     Status: None   Collection Time: 07/19/21  8:01 PM   Specimen: Nasopharyngeal Swab; Nasopharyngeal(NP) swabs in vial  transport medium  Result Value Ref Range Status   SARS Coronavirus 2 by RT PCR NEGATIVE NEGATIVE Final    Comment: (NOTE) SARS-CoV-2 target nucleic acids are NOT DETECTED.  The SARS-CoV-2 RNA is generally detectable in upper respiratory specimens during the acute phase of infection. The lowest concentration of SARS-CoV-2 viral copies this assay can detect is 138 copies/mL. A negative result does not preclude SARS-Cov-2 infection and should not be used as the sole basis for treatment or other patient management decisions. A negative result may occur with  improper specimen collection/handling, submission of specimen other than nasopharyngeal swab,  presence of viral mutation(s) within the areas targeted by this assay, and inadequate number of viral copies(<138 copies/mL). A negative result must be combined with clinical observations, patient history, and epidemiological information. The expected result is Negative.  Fact Sheet for Patients:  EntrepreneurPulse.com.au  Fact Sheet for Healthcare Providers:  IncredibleEmployment.be  This test is no t yet approved or cleared by the [place] FDA and  has been authorized for detection and/or diagnosis of SARS-CoV-2 by FDA under an Emergency Use Authorization (EUA). This EUA will remain  in effect (meaning this test can be used) for the duration of the COVID-19 declaration under Section 564(b)(1) of the Act, 21 U.S.C.section 360bbb-3(b)(1), unless the authorization is terminated  or revoked sooner.       Influenza A by PCR NEGATIVE NEGATIVE Final   Influenza B by PCR NEGATIVE NEGATIVE Final    Comment: (NOTE) The Xpert Xpress SARS-CoV-2/FLU/RSV plus assay is intended as an aid in the diagnosis of influenza from Nasopharyngeal swab specimens and should not be used as a sole basis for treatment. Nasal washings and aspirates are unacceptable for Xpert Xpress SARS-CoV-2/FLU/RSV testing.  Fact  Sheet for Patients: EntrepreneurPulse.com.au  Fact Sheet for Healthcare Providers: IncredibleEmployment.be  This test is not yet approved or cleared by the [place] FDA and has been authorized for detection and/or diagnosis of SARS-CoV-2 by FDA under an Emergency Use Authorization (EUA). This EUA will remain in effect (meaning this test can be used) for the duration of the COVID-19 declaration under Section 564(b)(1) of the Act, 21 U.S.C. section 360bbb-3(b)(1), unless the authorization is terminated or revoked.  Performed at [facility], [place] [address] [place], [place] [address]      Time coordinating discharge:  35 minutes  SIGNED:   Barb Merino, MD  [facility] 07/24/2021, 12:52 PM

## 2021-07-25 ENCOUNTER — Inpatient Hospital Stay: Payer: Medicare Other | Admitting: Gynecologic Oncology

## 2021-08-08 ENCOUNTER — Telehealth: Payer: Self-pay | Admitting: Oncology

## 2021-08-08 NOTE — Telephone Encounter (Signed)
Called [place] and she would like tomorrow's apt with Dr. Berline Lopes to be a phone visit because Brynleigh is in inpatient hospice.

## 2021-08-09 ENCOUNTER — Encounter: Payer: Self-pay | Admitting: Gynecologic Oncology

## 2021-08-09 ENCOUNTER — Other Ambulatory Visit: Payer: Self-pay

## 2021-08-09 ENCOUNTER — Inpatient Hospital Stay: Payer: Medicare Other | Attending: Gynecologic Oncology | Admitting: Gynecologic Oncology

## 2021-08-09 DIAGNOSIS — C541 Malignant neoplasm of endometrium: Secondary | ICD-10-CM | POA: Diagnosis not present

## 2021-08-09 DIAGNOSIS — Z7189 Other specified counseling: Secondary | ICD-10-CM | POA: Diagnosis not present

## 2021-08-09 NOTE — Progress Notes (Signed)
Gynecologic Oncology Telehealth Consult Note: Gyn-Onc  I connected with Connie Miranda on 08/09/21 at  2:00 PM EDT by telephone and verified that I am speaking with the correct person using two identifiers.  I discussed the limitations, risks, security and privacy concerns of performing an evaluation and management service by telemedicine and the availability of in-person appointments. I also discussed with the patient that there may be a patient responsible charge related to this service. The patient expressed understanding and agreed to proceed.  Other persons participating in the visit and their role in the encounter: Connie Miranda, patient's daughter.  Patient's location: Home Provider's location: [facility]  Reason for Visit: follow-up after hospital discharge  Treatment History: Oncology History Overview Note  MMR IHC and MSI testing: loss of MLH1, PMS2   Endometrial carcinoma ([place])  12/24/2019 Imaging   CT A/P: IMPRESSION: 1. The previously measured right vaginal fluid collection at 2.8 x 1 point 0 cm is not clearly demonstrated on today's study. There does appear to be a fluid collection in the left labia majora measuring 4.3 x 1.7 cm, similar to prior study. No features on today's study to suggest necrotizing fasciitis. 2. Marked apparent thickening of the endometrium. Neoplasm not excluded. Pelvic ultrasound recommended to further evaluate. 3. Collapsed gallbladder. Gallstones documented on previous cholecystostomy tube injection are not visualized on today's CT. 4.  Aortic Atherosclerois (ICD10-170.0)   12/26/2019 Imaging   Pelvic ultrasound: 1. Thickened endometrial stripe measuring up to 19.1 mm in thickness. Endometrial thickness is considered abnormal for an asymptomatic post-menopausal female. Endometrial sampling should be considered to exclude carcinoma. 2. Nonvisualization of the ovaries. No adnexal mass or abnormal free fluid within the pelvis. 3. No other acute  abnormality.   01/02/2020 Initial Biopsy   D&C - Grade 2 endometrioid adenocarcinoma   01/02/2020 Initial Diagnosis   Endometrial carcinoma ([place])   02/08/2020 Treatment Plan Change   Mirena IUD placed   04/09/2021 Pathology Results   Endometrial biopsy: grade 2, endometrioid    07/18/2021 Surgery   Plan for definitive surgery but findings at time of surgery included tumor extending 1/3 to 1/2 of upper posterior vaginal wall, replacing cervix; uterus enlarged, firm and minimally mobile.  Vaginal biopsies performed   07/18/2021 Pathology Results   Vaginal biopsies: gr3 carcinoma, IHC pending   07/19/2021 Imaging   CT A/P: 1. Acute deep vein thrombosis in the left common femoral vein and adjacent external iliac vein. 2. Acute or subacute left femoral neck fracture, moderately displaced. Left hip effusion. Bony demineralization. 3. The endometrial mass has enlarged to nearly 5 times the size shown on 01/04/2021, likely with some early extra uterine extension. There is mild right external iliac adenopathy as well as a newly appreciable right middle lobe pulmonary nodule measuring at least 1.1 cm in diameter. 4. Prominent stool in the rectal vault suggesting impaction. Faint perirectal stranding may reflect stercoral colitis. 5. Gastroesophageal reflux.  Small hiatal hernia. 6. Other imaging findings of potential clinical significance: Aortic Atherosclerosis (ICD10-I70.0). Coronary atherosclerosis. Old granulomatous disease.     Interval History: I spoke with the patient's daughter today.  The patient is at home with hospice services.  Her appetite has been up and down. Her Plavix and Eliquis were recently stopped giving ongoing menstrual-like vaginal bleeding.  She describes this as being burgundy in color and requiring multiple pad changes a day.  Mother's pain seems to be controlled, she is not complaining of having significant pain.  They are using morphine in the morning  and evening  and before any amount of movement as expected.  She is also getting Tylenol 3 times a day.  She is not had a bowel movement in some time, but they have medications to use to help her with this.  Connie Miranda drove back up to [place] yesterday to arrange a few things at home and will be back in [place] in the next couple of weeks.  Her daughter was able to get an accommodation to do school virtually this fall.  Past Medical/Surgical History: Past Medical History:  Diagnosis Date   Edema    Edema of both legs 01/30/2016   Glucose-6-phosphate dehydrogenase deficiency    possible diagnosis of G6PD per patient "phosphate 6 deficiency"   Hypertension    Osteoarthritis    Pernicious anemia    Stroke ([place]) 09/28/2000   couldn't move her left foot which resolved    Past Surgical History:  Procedure Laterality Date   BONE MARROW BIOPSY  12/1966   for anemia   IR PERC CHOLECYSTOSTOMY  04/08/2017   IR RADIOLOGIST EVAL & MGMT  05/20/2017   IR SINUS/FIST TUBE CHK-NON GI  06/11/2017   MULTIPLE TOOTH EXTRACTIONS     OTHER SURGICAL HISTORY     cyst from panty line removed as child   VAGINAL DELIVERY N/A 07/18/2021   Procedure: VAGINAL BIOPSY;  Surgeon: Lafonda Mosses, MD;  Location: [facility];  Service: Gynecology;  Laterality: N/A;    Family History  Problem Relation Age of Onset   Diabetes Brother    Diabetes Brother    Heart failure Mother 33   Heart attack Father 79    Social History   Socioeconomic History   Marital status: Single    Spouse name: Not on file   Number of children: Not on file   Years of education: Not on file   Highest education level: Not on file  Occupational History   Not on file  Tobacco Use   Smoking status: Former    Types: Cigarettes    Quit date: 12/29/1993    Years since quitting: 27.6   Smokeless tobacco: Never  Vaping Use   Vaping Use: Never used  Substance and Sexual Activity   Alcohol use: No   Drug use: No   Sexual activity: Not Currently  Other Topics  Concern   Not on file  Social History Narrative   Live alone in house and uses a cane and wheelchair to get around.  She has not been upstairs in over a year.  She does not drive, but she uses a cab or bus to go to grocery store, but usually friends and family bring her things.  Niece and nephew in [place].     Social Determinants of Health   Financial Resource Strain: Not on file  Food Insecurity: Not on file  Transportation Needs: Not on file  Physical Activity: Not on file  Stress: Not on file  Social Connections: Not on file    Current Medications:  Current Outpatient Medications:    acetaminophen (TYLENOL) 500 MG tablet, Take 2 tablets (1,000 mg total) by mouth 3 (three) times daily., Disp: 30 tablet, Rfl: 0   apixaban (ELIQUIS) 5 MG TABS tablet, Take 1 tablet (5 mg total) by mouth 2 (two) times daily., Disp: 60 tablet, Rfl: 0   Calcium Carbonate-Vitamin D (CALCIUM + D PO), Take 1 tablet by mouth 3 (three) times a week., Disp: , Rfl:    furosemide (LASIX) 40 MG tablet, Take 3  tablets (120 mg total) by mouth daily., Disp: 90 tablet, Rfl: 11   isosorbide mononitrate (IMDUR) 120 MG 24 hr tablet, Take 120 mg by mouth daily in the afternoon., Disp: , Rfl:    liver oil-zinc oxide (DESITIN) 40 % ointment, Apply 1 application topically as needed for irritation (barrier protection (buttocks))., Disp: , Rfl:    LORazepam (ATIVAN) 0.5 MG tablet, Take 1 tablet (0.5 mg total) by mouth every 4 (four) hours as needed for anxiety. May crush, mix with water and give sublingually if needed., Disp: 42 tablet, Rfl: 0   meloxicam (MOBIC) 7.5 MG tablet, Take 1 tablet (7.5 mg total) by mouth at bedtime., Disp: 30 tablet, Rfl: 0   Morphine Sulfate (MORPHINE CONCENTRATE) 10 MG/0.5ML SOLN concentrated solution, Place 0.5 mLs (10 mg total) under the tongue every 2 (two) hours as needed for moderate pain, severe pain, anxiety or shortness of breath., Disp: 30 mL, Rfl: 0   Multiple Vitamin (MULTIVITAMIN WITH  MINERALS) TABS, Take 1 tablet by mouth daily., Disp: 30 tablet, Rfl: 0   Polyethyl Glycol-Propyl Glycol (LUBRICANT EYE DROPS) 0.4-0.3 % SOLN, Place 1 drop into both eyes 3 (three) times daily as needed (dry/irritated eyes)., Disp: , Rfl:    polyethylene glycol (MIRALAX / GLYCOLAX) 17 g packet, Take 17 g by mouth 3 (three) times a week., Disp: , Rfl:    white petrolatum (VASELINE) GEL, Apply 1 application topically as needed for dry skin (lymphedema)., Disp: , Rfl:   Review of Symptoms: Unable to perform.  Physical Exam: There were no vitals taken for this visit. Deferred given limitations of phone visit.  Laboratory & Radiologic Studies: None new  Assessment & Plan: Connie Miranda is a 87 y.o. woman with metastatic endometrial cancer with recent diagnosis of an acute DVT and subacute versus acute femur fracture now on hospice.  It sounds like the patient is doing well with hospice services at home.  Her daughter and I discussed again how her decline may look.  I agree with stopping her anticoagulation increased vaginal bleeding.  I think it would be much more traumatic for the patient to have a significant bleeding episode then to have a significant VTE.  We discussed that if she were to have a large pulmonary embolism, this could be a life ending event.  The patient's daughter knows that she can call my office with any concerns, questions, or needs.  We will not schedule the patient for an in person follow-up given her overall health status.  I discussed the assessment and treatment plan with the patient. The patient was provided with an opportunity to ask questions and all were answered. The patient agreed with the plan and demonstrated an understanding of the instructions.   The patient was advised to call back or see an in-person evaluation if the symptoms worsen or if the condition fails to improve as anticipated.   22 minutes of total time was spent for this patient encounter,  including preparation, phone counseling counseling with the patient and coordination of care, and documentation of the encounter.   Jeral Pinch, MD  Division of Gynecologic Oncology  Department of Obstetrics and Gynecology  [facility]

## 2021-09-23 ENCOUNTER — Other Ambulatory Visit: Payer: Self-pay | Admitting: Gynecologic Oncology

## 2021-09-23 ENCOUNTER — Telehealth: Payer: Self-pay | Admitting: Oncology

## 2021-09-23 NOTE — Telephone Encounter (Signed)
Connie Miranda left a message to let us know that Connie Miranda passed away on 2021/09/29.

## 2021-09-28 DEATH — deceased
